# Patient Record
Sex: Male | Born: 1935
Health system: Southern US, Community
[De-identification: ages and names within clinical notes are randomized; demographics above are authoritative.]

## PROBLEM LIST (undated history)

## (undated) DIAGNOSIS — J449 Chronic obstructive pulmonary disease, unspecified: Secondary | ICD-10-CM

## (undated) DIAGNOSIS — I10 Essential (primary) hypertension: Secondary | ICD-10-CM

## (undated) DIAGNOSIS — E039 Hypothyroidism, unspecified: Secondary | ICD-10-CM

## (undated) DIAGNOSIS — R001 Bradycardia, unspecified: Secondary | ICD-10-CM

## (undated) DIAGNOSIS — D649 Anemia, unspecified: Secondary | ICD-10-CM

## (undated) HISTORY — PX: CATARACT EXTRACTION: SUR2

## (undated) HISTORY — DX: Chronic obstructive pulmonary disease, unspecified: J44.9

## (undated) HISTORY — PX: OTHER SURGICAL HISTORY: SHX169

## (undated) HISTORY — PX: TOTAL HIP ARTHROPLASTY: SHX124

## (undated) HISTORY — PX: PACEMAKER INSERTION: SHX728

## (undated) HISTORY — DX: Anemia, unspecified: D64.9

## (undated) HISTORY — DX: Hypothyroidism, unspecified: E03.9

## (undated) HISTORY — DX: Essential (primary) hypertension: I10

---

## 2011-02-21 HISTORY — PX: GALLBLADDER SURGERY: SHX652

## 2011-03-18 DIAGNOSIS — N400 Enlarged prostate without lower urinary tract symptoms: Secondary | ICD-10-CM | POA: Insufficient documentation

## 2012-08-12 ENCOUNTER — Encounter: Payer: Self-pay | Admitting: Pulmonary Disease

## 2012-08-12 ENCOUNTER — Ambulatory Visit (INDEPENDENT_AMBULATORY_CARE_PROVIDER_SITE_OTHER): Payer: Medicare Other | Admitting: Pulmonary Disease

## 2012-08-12 VITALS — BP 140/80 | HR 101 | Temp 98.4°F | Ht 66.0 in | Wt 169.8 lb

## 2012-08-12 NOTE — Assessment & Plan Note (Signed)
The patient will require oxygen 24 hours a day.  Orders have been sent to arrange this.

## 2012-08-12 NOTE — Assessment & Plan Note (Signed)
The patient likely has significant airflow obstruction, and I'm wondering if he has reached a critical FEV1 that has resulted in his sudden dyspnea on exertion.  He is on an aggressive bronchodilator regimen, and I would like to continue this.  I will eliminate redundant medication.  He needs to have pulmonary function studies done, and these will be arranged.  It sounds like he is still having asthmatic bronchitis, and I've asked him to finish up his prednisone and an antibiotic.  He is obviously going to need oxygen 24 hours a day, and I also discussed with his granddaughter the possibility of pulmonary rehabilitation.  I suspect his dry hacking cough is related to his ACE inhibitor that was recently discontinued, but also there is a history and CT findings that would suggest GERD.  I think he should be started on a proton pump inhibitor daily.

## 2012-08-12 NOTE — Patient Instructions (Addendum)
Finish up your antibiotic and prednisone as directed. Stay on your symbicort and spiriva, but do not use your neb machine regularly.  This should be saved for "bad days" and for rescue.  Will see if using a spacer with your symbicort helps you get more of the medication in.  Will need to wear your oxygen 24hrs a day from this point forward. Would like to treat you for reflux disease, and start back on nexium one each day EVERYDAY.  I suspect your cough will greatly improve off your prior blood pressure medication.  It will take awhile to get this completely out of your system. Will schedule for breathing tests in 2 weeks, and see you back same day for review.

## 2012-08-12 NOTE — Progress Notes (Signed)
Subjective:    Patient ID: [name] Plant Sr., male    DOB: 06/10/1926, 79 y.o.   MRN: [id number]  HPI The patient is an 79 year old male who comes in today as a self-referral for pulmonary evaluation.  He has a long history of tobacco abuse, but has not smoked since 1973.  Apparently, last summer, he was able to do just about any physical activity that he wished.  He was recently admitted the end of January in [place] with increasing cough and congestion, and was felt to have a COPD exacerbation.  He was treated with antibiotics and prednisone, and discharged home.  His chest x-ray during that time did not show any evidence for pneumonia.  The patient feels he never got back to his usual baseline, although he did clearly feel better.  He recently went to the emergency room 5 days ago with a severe dry hacking cough that sounded cyclical in nature, as well as a cough that would at times produce large quantities of purulent mucus.  He had another chest x-ray that did not show pneumonia, and also a CT scan of the chest that was fairly unremarkable except for emphysematous changes.  It should be noted that he was on an ACE inhibitor at that time, and this has been discontinued.  He was treated with Levaquin and a course of prednisone, and has been on that since last weekend.  He does think that he is improving, but still is bringing up purulent mucus.  The patient states he now will get winded bringing groceries in from the car, and his spells notices worsening dyspnea anytime he has to use his arms.  He is describing coughing paroxysms that are dry.  The patient has some postnasal drip, but admits to having significant reflux symptoms.  His CT chest did show a dilated esophagus with dysmotility and air-fluid levels.  He is currently on oxygen with sleep, but desaturates significantly in the office today with exertion.  He has not had recent pulmonary function studies.  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   Review of Systems  Constitutional: Negative for fever and unexpected weight change.  HENT: Positive for rhinorrhea and sneezing. Negative for ear pain, nosebleeds, congestion, sore throat, trouble swallowing, dental problem, postnasal drip and sinus pressure.   Eyes: Negative for redness and itching.  Respiratory: Positive for cough, shortness of breath and wheezing. Negative for chest tightness.   Cardiovascular: Negative for palpitations and leg swelling.  Gastrointestinal: Negative for nausea and vomiting.  Genitourinary: Negative for dysuria.  Musculoskeletal: Negative for joint swelling.  Skin: Negative for rash.  Neurological: Negative for headaches.  Hematological: Does not bruise/bleed easily.  Psychiatric/Behavioral: Negative for dysphoric mood. The patient is not nervous/anxious.        Objective:   Physical Exam Constitutional:  Well developed, no acute distress  HENT:  Nares patent without discharge  Oropharynx without exudate, palate and uvula are normal  Eyes:  Perrla, eomi, no scleral icterus  Neck:  No JVD, no TMG  Cardiovascular:  Normal rate, regular rhythm, no rubs or gallops.  No murmurs        Intact distal pulses but decreased.  Pulmonary :  decreased breath sounds, no stridor or respiratory distress   Rhonchi noted, but no wheezing.   Abdominal:  Soft, nondistended, bowel sounds present.  No tenderness noted.   Musculoskeletal:  mild lower extremity edema noted.  Lymph Nodes:  No cervical lymphadenopathy noted  Skin:  No cyanosis noted  Neurologic:  Alert, appropriate, moves all 4 extremities without obvious deficit.         Assessment & Plan:

## 2012-08-26 ENCOUNTER — Encounter: Payer: Self-pay | Admitting: Pulmonary Disease

## 2012-08-26 ENCOUNTER — Ambulatory Visit (INDEPENDENT_AMBULATORY_CARE_PROVIDER_SITE_OTHER): Payer: Medicare Other | Admitting: Pulmonary Disease

## 2012-08-26 VITALS — BP 138/72 | HR 86 | Temp 97.4°F | Ht 66.0 in | Wt 163.0 lb

## 2012-08-26 DIAGNOSIS — J9610 Chronic respiratory failure, unspecified whether with hypoxia or hypercapnia: Secondary | ICD-10-CM

## 2012-08-26 LAB — PULMONARY FUNCTION TEST

## 2012-08-26 MED ORDER — ESOMEPRAZOLE MAGNESIUM 40 MG PO CPDR: 40.0000 mg | DELAYED_RELEASE_CAPSULE | Freq: Every day | ORAL | Status: DC

## 2012-08-26 NOTE — Progress Notes (Signed)
  Subjective:    Patient ID: [name] Plant Sr., male    DOB: 06/10/1926, 79 y.o.   MRN: [id number]  HPI The patient comes in today for followup of his known COPD, as well as chronic cough.  At the last visit, he was asked to stay off his ACE inhibitor, and also placed on more consistent proton pump inhibitor use for reflux.  He is seen significant improvement in his cough since being on the medication.  He denies having any swallowing issues, or breakthrough reflux symptoms.  He had PFTs done today which showed no airflow obstruction by FEV1 percent, but did have obstruction by flow-volume loop and a 17% improvement in forced vital capacity with bronchodilator.  He also had very severe air trapping on lung volumes with no restriction.  His diffusion capacity was normal. I have asked the patient to wear her oxygen more consistently with exertional activities, and although he told to take with him today, he is not wearing it actively.   Review of Systems  Constitutional: Negative for fever and unexpected weight change.  HENT: Positive for rhinorrhea and postnasal drip. Negative for ear pain, nosebleeds, congestion, sore throat, sneezing, trouble swallowing, dental problem and sinus pressure.   Eyes: Negative for redness and itching.  Respiratory: Positive for cough. Negative for chest tightness, shortness of breath and wheezing.   Cardiovascular: Negative for palpitations and leg swelling.  Gastrointestinal: Negative for nausea and vomiting.  Genitourinary: Negative for dysuria.  Musculoskeletal: Negative for joint swelling.  Skin: Negative for rash.  Neurological: Negative for headaches.  Hematological: Does not bruise/bleed easily.  Psychiatric/Behavioral: Negative for dysphoric mood. The patient is not nervous/anxious.        Objective:   Physical Exam Wd male in nad Nose without purulence or discharge noted. Neck without TMG/LN Chest with mildly decreased bs, no wheezing Cor with  RRR LE without significant edema, no cyanosis Alert and oriented, moves all 4.        Assessment & Plan:

## 2012-08-26 NOTE — Patient Instructions (Addendum)
Would take 1/2 of a prednisone tab every other day for 5 days, then stop Would recommend that you go to pulmonary rehab at [facility].  Let me know if you are interested. Stay on oxygen at night while sleeping, and use during day when you are away from home or doing exertional activity around the house. Stay on your symbicort and spiriva Would like you to stay on your acid reflux medication, and will change to omeprazole 40mg  one a day (generic for nexium) Please call if your cough becomes more of a problem again. followup with me in 4mos.

## 2012-08-26 NOTE — Assessment & Plan Note (Signed)
The patient has a least moderate airflow obstruction primarily manifested as air trapping.  He is on a very good bronchodilator regimen, and also requires oxygen with sleep and exertional activities.  I think he would greatly benefit from a pulmonary rehabilitation program as well, and he will consider this.  If he continues with significant dyspnea, would consider a cardiac workup for completeness.

## 2012-08-26 NOTE — Progress Notes (Signed)
PFT done today. 

## 2012-08-26 NOTE — Assessment & Plan Note (Signed)
The pt's cough is much improved off his ACE, and with more aggressive treatment of his GERD.  If this does not totally resolve, would consider GI evaluation.

## 2012-09-01 ENCOUNTER — Other Ambulatory Visit: Payer: Self-pay | Admitting: Pulmonary Disease

## 2012-09-01 MED ORDER — ESOMEPRAZOLE MAGNESIUM 40 MG PO CPDR: 40.0000 mg | DELAYED_RELEASE_CAPSULE | Freq: Every day | ORAL | Status: DC

## 2012-09-14 ENCOUNTER — Telehealth: Payer: Self-pay | Admitting: Pulmonary Disease

## 2012-09-14 DIAGNOSIS — J439 Emphysema, unspecified: Secondary | ICD-10-CM

## 2012-09-14 DIAGNOSIS — J438 Other emphysema: Secondary | ICD-10-CM

## 2012-09-14 NOTE — Telephone Encounter (Signed)
Can we change pt's nexium to A generic and advised regarding referral. Thanks [facility]

## 2012-09-14 NOTE — Telephone Encounter (Signed)
Can give him omeprazole at same dose.

## 2012-09-15 ENCOUNTER — Encounter: Payer: Self-pay | Admitting: Pulmonary Disease

## 2012-09-15 MED ORDER — OMEPRAZOLE 40 MG PO CPDR: 40.0000 mg | DELAYED_RELEASE_CAPSULE | Freq: Every day | ORAL | Status: DC

## 2012-09-15 NOTE — Telephone Encounter (Signed)
Per TD send RX to [facility]. I have done so. Please advise regarding Rehab [facility] thanks

## 2012-09-15 NOTE — Telephone Encounter (Signed)
Will advise TD OF THIS FIRST

## 2012-09-15 NOTE — Telephone Encounter (Signed)
Ok to refer to pulmonary rehab.   

## 2012-09-15 NOTE — Telephone Encounter (Signed)
Order placed. Brownie Gockel, CMA  

## 2012-09-26 ENCOUNTER — Encounter: Payer: Self-pay | Admitting: Pulmonary Disease

## 2012-10-03 ENCOUNTER — Encounter: Payer: Self-pay | Admitting: *Deleted

## 2012-10-03 ENCOUNTER — Encounter: Payer: Self-pay | Admitting: Adult Health

## 2012-10-03 ENCOUNTER — Other Ambulatory Visit (INDEPENDENT_AMBULATORY_CARE_PROVIDER_SITE_OTHER): Payer: Medicare Other

## 2012-10-03 ENCOUNTER — Ambulatory Visit (INDEPENDENT_AMBULATORY_CARE_PROVIDER_SITE_OTHER): Payer: Medicare Other | Admitting: Adult Health

## 2012-10-03 ENCOUNTER — Ambulatory Visit (INDEPENDENT_AMBULATORY_CARE_PROVIDER_SITE_OTHER)
Admission: RE | Admit: 2012-10-03 | Discharge: 2012-10-03 | Disposition: A | Discharge status: Home / Self Care | Payer: Medicare Other | Source: Ambulatory Visit | Attending: Adult Health | Admitting: Adult Health

## 2012-10-03 VITALS — BP 146/64 | HR 60 | Temp 99.1°F | Ht 66.0 in | Wt 169.8 lb

## 2012-10-03 DIAGNOSIS — R0602 Shortness of breath: Secondary | ICD-10-CM

## 2012-10-03 DIAGNOSIS — J439 Emphysema, unspecified: Secondary | ICD-10-CM

## 2012-10-03 DIAGNOSIS — J438 Other emphysema: Secondary | ICD-10-CM

## 2012-10-03 DIAGNOSIS — J449 Chronic obstructive pulmonary disease, unspecified: Secondary | ICD-10-CM

## 2012-10-03 DIAGNOSIS — R600 Localized edema: Secondary | ICD-10-CM

## 2012-10-03 DIAGNOSIS — R609 Edema, unspecified: Secondary | ICD-10-CM

## 2012-10-03 LAB — BRAIN NATRIURETIC PEPTIDE: Pro B Natriuretic peptide (BNP): 17 pg/mL (ref 0.0–100.0)

## 2012-10-03 LAB — CBC WITH DIFFERENTIAL/PLATELET
Basophils Relative: 0.4 % (ref 0.0–3.0)
Eosinophils Absolute: 0.1 10*3/uL (ref 0.0–0.7)
Lymphocytes Relative: 20.3 % (ref 12.0–46.0)
MCHC: 33.3 g/dL (ref 30.0–36.0)
Monocytes Relative: 8.3 % (ref 3.0–12.0)
Neutrophils Relative %: 69.4 % (ref 43.0–77.0)
RBC: 4.09 Mil/uL — ABNORMAL LOW (ref 4.22–5.81)
WBC: 7.8 10*3/uL (ref 4.5–10.5)

## 2012-10-03 LAB — BASIC METABOLIC PANEL
BUN: 19 mg/dL (ref 6–23)
CO2: 24 mEq/L (ref 19–32)
Chloride: 101 mEq/L (ref 96–112)
Potassium: 5.3 mEq/L — ABNORMAL HIGH (ref 3.5–5.1)

## 2012-10-03 MED ORDER — FUROSEMIDE 20 MG PO TABS: ORAL_TABLET | ORAL | Status: DC

## 2012-10-03 NOTE — Patient Instructions (Addendum)
Increase Lasix 20mg  3 tabs daily for 3 days  Low salt diet  Continue on Oxygen 2 l/m continuously.  We are setting you up for a 2 D echo .  I will call with labs and xray results.  Follow up in office in 3 days and As needed   Please contact office for sooner follow up if symptoms do not improve or worsen or seek emergency care

## 2012-10-04 NOTE — Assessment & Plan Note (Addendum)
Worsening Dyspnea ? Etiology  Possible underlying decompensated cor pulmonale  Will check labs with bnp . Set up for echo  Diuresis as tolerated. Have him return for short term follow up with bmet  Case discussed with Dr. Shelle Iron , w/ pt examined by physician as well.  Will attempt OP workup w/ close follow up , pt /family aware if condition worsens to contact us immediately or go to ER   Plan  Increase Lasix 20mg  3 tabs daily for 3 days  Low salt diet  Continue on Oxygen 2 l/m continuously.  We are setting you up for a 2 D echo .  I will call with labs and xray results.  Follow up in office in 3 days and As needed   Please contact office for sooner follow up if symptoms do not improve or worsen or seek emergency care

## 2012-10-04 NOTE — Progress Notes (Signed)
  Subjective:    Patient ID: [name] Plant Sr., male    DOB: [birth year], 79 y.o.   MRN: [id number]  HPI 79 yo male with known hx of COPD, as well as chronic cough. Seen for initial pulmonary consult 08/12/12  ACE stopped due to cough ~08/12/12  PFTs 08/26/12   which showed no airflow obstruction by FEV1 percent, but did have obstruction by flow-volume loop and a 17% improvement in forced vital capacity with bronchodilator.  CT scan of the chest~ 08/2012 that was fairly unremarkable except for emphysematous changes.  10/03/12 Acute OV  Complains of leg swelling, feels like he is retaining lots of fluid. Seen in ER yesterday, started on Lasix 20mg  . Does not feel it is helping.   Pt reports SOB and feels very weak. Wears out easily with walking.  Cough is not much better, Still has dry barking cough.  No previous cardiac workup.  Has orthopnea. Had to sleep in recliner last night.  No calf pain , chest pain or hemoptysis.  Wearing his O2 24/7 now.  No fever or discolored mucus.  Spiriva was stopped by urology due to urinary retention (Hx of BPH) .  Remains on symbicort Twice daily       Review of Systems Constitutional:   No  weight loss, night sweats,  Fevers, chills,  +fatigue, or  lassitude.  HEENT:   No headaches,  Difficulty swallowing,  Tooth/dental problems, or  Sore throat,                No sneezing, itching, ear ache,  +nasal congestion, post nasal drip,   CV:  No chest pain,    dizziness, palpitations, syncope.   GI  No heartburn, indigestion, abdominal pain, nausea, vomiting, diarrhea, change in bowel habits, loss of appetite, bloody stools.   Resp:     No coughing up of blood.  No change in color of mucus.    No chest wall deformity  Skin: no rash or lesions.  GU: no dysuria, change in color of urine, no urgency or frequency.  No flank pain, no hematuria   MS:  No joint pain or swelling.  No decreased range of motion.  No back pain.  Psych:  No change in mood or  affect. No depression or anxiety.  No memory loss.          Objective:   Physical Exam GEN: A/Ox3; pleasant , NAD, elderly   HEENT:  [place]/AT,  EACs-clear, TMs-wnl, NOSE-clear, THROAT-clear, no lesions, no postnasal drip or exudate noted.   NECK:  Supple w/ fair ROM; no JVD; normal carotid impulses w/o bruits; no thyromegaly or nodules palpated; no lymphadenopathy.  RESP  Diminished BS in bases  w/o, wheezes/ rales/ or rhonchi.no accessory muscle use, no dullness to percussion  CARD:  RRR, no m/r/g  , 1-2+peripheral edema, pulses intact, no cyanosis or clubbing.  GI:   Soft & nt; nml bowel sounds; no organomegaly or masses detected.  Musco: Warm bil, no deformities or joint swelling noted.   Neuro: alert, no focal deficits noted.    Skin: Warm, no lesions or rashes         Assessment & Plan:

## 2012-10-05 NOTE — Progress Notes (Signed)
Quick Note:  Spoke with patient's daughter, she is aware of pt's cxr results / recs as stated by TP. Tammy verbalized her understanding and denied any questions. ______

## 2012-10-05 NOTE — Progress Notes (Signed)
Quick Note:  Spoke with patient's daughter, she is aware of pt's cxr results / recs as stated by TP. Tammy verbalized her understanding and denied any questions. ______ 

## 2012-10-06 ENCOUNTER — Encounter: Payer: Self-pay | Admitting: Adult Health

## 2012-10-06 ENCOUNTER — Inpatient Hospital Stay (HOSPITAL_COMMUNITY): Payer: Medicare Other

## 2012-10-06 ENCOUNTER — Inpatient Hospital Stay (HOSPITAL_COMMUNITY)
Admission: AD | Admit: 2012-10-06 | Discharge: 2012-10-10 | DRG: 189 | Disposition: A | Discharge status: Home Health | Payer: Medicare Other | Source: Ambulatory Visit | Attending: Pulmonary Disease | Admitting: Pulmonary Disease

## 2012-10-06 ENCOUNTER — Ambulatory Visit (INDEPENDENT_AMBULATORY_CARE_PROVIDER_SITE_OTHER): Payer: Medicare Other | Admitting: Adult Health

## 2012-10-06 ENCOUNTER — Encounter (HOSPITAL_COMMUNITY): Payer: Self-pay

## 2012-10-06 ENCOUNTER — Ambulatory Visit (HOSPITAL_BASED_OUTPATIENT_CLINIC_OR_DEPARTMENT_OTHER): Payer: Medicare Other

## 2012-10-06 VITALS — BP 162/68 | HR 83 | Temp 97.1°F | Ht 66.0 in | Wt 167.0 lb

## 2012-10-06 DIAGNOSIS — N400 Enlarged prostate without lower urinary tract symptoms: Secondary | ICD-10-CM | POA: Diagnosis present

## 2012-10-06 DIAGNOSIS — J9620 Acute and chronic respiratory failure, unspecified whether with hypoxia or hypercapnia: Principal | ICD-10-CM | POA: Diagnosis present

## 2012-10-06 DIAGNOSIS — K219 Gastro-esophageal reflux disease without esophagitis: Secondary | ICD-10-CM | POA: Diagnosis present

## 2012-10-06 DIAGNOSIS — R0602 Shortness of breath: Secondary | ICD-10-CM

## 2012-10-06 DIAGNOSIS — J45901 Unspecified asthma with (acute) exacerbation: Secondary | ICD-10-CM

## 2012-10-06 DIAGNOSIS — J9600 Acute respiratory failure, unspecified whether with hypoxia or hypercapnia: Secondary | ICD-10-CM

## 2012-10-06 DIAGNOSIS — R05 Cough: Secondary | ICD-10-CM

## 2012-10-06 DIAGNOSIS — R059 Cough, unspecified: Secondary | ICD-10-CM | POA: Diagnosis present

## 2012-10-06 DIAGNOSIS — R0600 Dyspnea, unspecified: Secondary | ICD-10-CM

## 2012-10-06 DIAGNOSIS — R911 Solitary pulmonary nodule: Secondary | ICD-10-CM | POA: Diagnosis present

## 2012-10-06 DIAGNOSIS — J019 Acute sinusitis, unspecified: Secondary | ICD-10-CM | POA: Diagnosis present

## 2012-10-06 DIAGNOSIS — E039 Hypothyroidism, unspecified: Secondary | ICD-10-CM | POA: Diagnosis present

## 2012-10-06 DIAGNOSIS — J439 Emphysema, unspecified: Secondary | ICD-10-CM

## 2012-10-06 DIAGNOSIS — J449 Chronic obstructive pulmonary disease, unspecified: Secondary | ICD-10-CM

## 2012-10-06 DIAGNOSIS — J9610 Chronic respiratory failure, unspecified whether with hypoxia or hypercapnia: Secondary | ICD-10-CM

## 2012-10-06 DIAGNOSIS — R0609 Other forms of dyspnea: Secondary | ICD-10-CM

## 2012-10-06 DIAGNOSIS — R0902 Hypoxemia: Secondary | ICD-10-CM | POA: Diagnosis present

## 2012-10-06 DIAGNOSIS — J441 Chronic obstructive pulmonary disease with (acute) exacerbation: Secondary | ICD-10-CM | POA: Diagnosis present

## 2012-10-06 DIAGNOSIS — I10 Essential (primary) hypertension: Secondary | ICD-10-CM | POA: Diagnosis present

## 2012-10-06 DIAGNOSIS — Z87891 Personal history of nicotine dependence: Secondary | ICD-10-CM

## 2012-10-06 DIAGNOSIS — R600 Localized edema: Secondary | ICD-10-CM

## 2012-10-06 LAB — COMPREHENSIVE METABOLIC PANEL
AST: 29 U/L (ref 0–37)
CO2: 29 mEq/L (ref 19–32)
Calcium: 9 mg/dL (ref 8.4–10.5)
Creatinine, Ser: 0.9 mg/dL (ref 0.50–1.35)
GFR calc Af Amer: 87 mL/min — ABNORMAL LOW (ref >90)
GFR calc non Af Amer: 75 mL/min — ABNORMAL LOW (ref >90)
Total Protein: 6.5 g/dL (ref 6.0–8.3)

## 2012-10-06 LAB — CBC WITH DIFFERENTIAL/PLATELET
Lymphocytes Relative: 19 % (ref 12–46)
Lymphs Abs: 1.5 10*3/uL (ref 0.7–4.0)
MCV: 87.8 fL (ref 78.0–100.0)
Neutrophils Relative %: 69 % (ref 43–77)
Platelets: 173 10*3/uL (ref 150–400)
RBC: 3.84 MIL/uL — ABNORMAL LOW (ref 4.22–5.81)
WBC: 7.8 10*3/uL (ref 4.0–10.5)

## 2012-10-06 LAB — URINALYSIS, ROUTINE W REFLEX MICROSCOPIC
Leukocytes, UA: NEGATIVE
Nitrite: NEGATIVE
Specific Gravity, Urine: 1.011 (ref 1.005–1.030)
pH: 6 (ref 5.0–8.0)

## 2012-10-06 LAB — PRO B NATRIURETIC PEPTIDE: Pro B Natriuretic peptide (BNP): 96.4 pg/mL (ref 0–450)

## 2012-10-06 MED ORDER — ARFORMOTEROL TARTRATE 15 MCG/2ML IN NEBU
15.0000 ug | INHALATION_SOLUTION | Freq: Two times a day (BID) | RESPIRATORY_TRACT | Status: DC
  Administered 2012-10-06 – 2012-10-10 (×8): 15 ug via RESPIRATORY_TRACT
  Filled 2012-10-06 (×12): qty 2

## 2012-10-06 MED ORDER — PANTOPRAZOLE SODIUM 40 MG PO TBEC
40.0000 mg | DELAYED_RELEASE_TABLET | Freq: Two times a day (BID) | ORAL | Status: DC
  Administered 2012-10-06 – 2012-10-10 (×8): 40 mg via ORAL
  Filled 2012-10-06 (×10): qty 1

## 2012-10-06 MED ORDER — DEXTROSE 5 % IV SOLN
500.0000 mg | INTRAVENOUS | Status: DC
  Administered 2012-10-06: 500 mg via INTRAVENOUS
  Filled 2012-10-06: qty 500

## 2012-10-06 MED ORDER — SENNOSIDES-DOCUSATE SODIUM 8.6-50 MG PO TABS
1.0000 | ORAL_TABLET | Freq: Every evening | ORAL | Status: DC | PRN
  Filled 2012-10-06: qty 1

## 2012-10-06 MED ORDER — ONDANSETRON HCL 4 MG PO TABS: 4.0000 mg | ORAL_TABLET | Freq: Four times a day (QID) | ORAL | Status: DC | PRN

## 2012-10-06 MED ORDER — LEVOTHYROXINE SODIUM 75 MCG PO TABS
75.0000 ug | ORAL_TABLET | Freq: Every day | ORAL | Status: DC
  Administered 2012-10-06 – 2012-10-10 (×5): 75 ug via ORAL
  Filled 2012-10-06 (×7): qty 1

## 2012-10-06 MED ORDER — POTASSIUM CHLORIDE CRYS ER 20 MEQ PO TBCR
40.0000 meq | EXTENDED_RELEASE_TABLET | Freq: Once | ORAL | Status: AC
  Administered 2012-10-06: 40 meq via ORAL
  Filled 2012-10-06: qty 2

## 2012-10-06 MED ORDER — BUDESONIDE 0.25 MG/2ML IN SUSP
0.2500 mg | Freq: Two times a day (BID) | RESPIRATORY_TRACT | Status: DC
  Administered 2012-10-06 – 2012-10-10 (×8): 0.25 mg via RESPIRATORY_TRACT
  Filled 2012-10-06 (×12): qty 2

## 2012-10-06 MED ORDER — HEPARIN SODIUM (PORCINE) 5000 UNIT/ML IJ SOLN
5000.0000 [IU] | Freq: Three times a day (TID) | INTRAMUSCULAR | Status: DC
  Administered 2012-10-06 – 2012-10-10 (×11): 5000 [IU] via SUBCUTANEOUS
  Filled 2012-10-06 (×14): qty 1

## 2012-10-06 MED ORDER — HYDROCODONE-HOMATROPINE 5-1.5 MG/5ML PO SYRP: 5.0000 mL | ORAL_SOLUTION | ORAL | Status: DC | PRN

## 2012-10-06 MED ORDER — ACETAMINOPHEN 650 MG RE SUPP: 650.0000 mg | Freq: Four times a day (QID) | RECTAL | Status: DC | PRN

## 2012-10-06 MED ORDER — SODIUM CHLORIDE 0.9 % IV SOLN: 250.0000 mL | INTRAVENOUS | Status: DC | PRN

## 2012-10-06 MED ORDER — SODIUM CHLORIDE 0.9 % IJ SOLN
3.0000 mL | Freq: Two times a day (BID) | INTRAMUSCULAR | Status: DC
  Administered 2012-10-06 – 2012-10-09 (×5): 3 mL via INTRAVENOUS

## 2012-10-06 MED ORDER — TAMSULOSIN HCL 0.4 MG PO CAPS
0.4000 mg | ORAL_CAPSULE | Freq: Every day | ORAL | Status: DC
  Administered 2012-10-06 – 2012-10-09 (×4): 0.4 mg via ORAL
  Filled 2012-10-06 (×5): qty 1

## 2012-10-06 MED ORDER — FINASTERIDE 5 MG PO TABS
5.0000 mg | ORAL_TABLET | Freq: Every day | ORAL | Status: DC
  Administered 2012-10-06 – 2012-10-10 (×5): 5 mg via ORAL
  Filled 2012-10-06 (×5): qty 1

## 2012-10-06 MED ORDER — SODIUM CHLORIDE 0.9 % IJ SOLN
3.0000 mL | Freq: Two times a day (BID) | INTRAMUSCULAR | Status: DC
  Administered 2012-10-07 – 2012-10-10 (×4): 3 mL via INTRAVENOUS

## 2012-10-06 MED ORDER — ONDANSETRON HCL 4 MG/2ML IJ SOLN: 4.0000 mg | Freq: Four times a day (QID) | INTRAMUSCULAR | Status: DC | PRN

## 2012-10-06 MED ORDER — ALBUTEROL SULFATE (5 MG/ML) 0.5% IN NEBU: 2.5000 mg | INHALATION_SOLUTION | RESPIRATORY_TRACT | Status: DC | PRN

## 2012-10-06 MED ORDER — FUROSEMIDE 20 MG PO TABS
20.0000 mg | ORAL_TABLET | Freq: Every day | ORAL | Status: DC
  Filled 2012-10-06: qty 1

## 2012-10-06 MED ORDER — SODIUM CHLORIDE 0.9 % IJ SOLN: 3.0000 mL | INTRAMUSCULAR | Status: DC | PRN

## 2012-10-06 MED ORDER — DILTIAZEM HCL ER BEADS 120 MG PO CP24
120.0000 mg | ORAL_CAPSULE | Freq: Every day | ORAL | Status: DC
  Administered 2012-10-06 – 2012-10-10 (×5): 120 mg via ORAL
  Filled 2012-10-06 (×5): qty 1

## 2012-10-06 MED ORDER — METHYLPREDNISOLONE SODIUM SUCC 125 MG IJ SOLR
80.0000 mg | Freq: Three times a day (TID) | INTRAMUSCULAR | Status: DC
  Administered 2012-10-06 – 2012-10-07 (×3): 80 mg via INTRAVENOUS
  Filled 2012-10-06 (×6): qty 1.28

## 2012-10-06 MED ORDER — ACETAMINOPHEN 325 MG PO TABS: 650.0000 mg | ORAL_TABLET | Freq: Four times a day (QID) | ORAL | Status: DC | PRN

## 2012-10-06 NOTE — Assessment & Plan Note (Signed)
Refractory asthmatic flare w/ associated Hypoxia .  Case discussed with Dr. Sherene Sires   Pt will be admitted to [facility] for further tx

## 2012-10-06 NOTE — H&P (Signed)
PULMONARY  / CRITICAL CARE MEDICINE  Name: [name] Bathgate Sr. MRN: [id number] DOB: 08/19/1924    ADMISSION DATE:  10/06/2012   PRIMARY SERVICE:  Pulmonary   CHIEF COMPLAINT:  Bad cough/ Can/t breathe   BRIEF PATIENT DESCRIPTION:  79 yo former smoker (quit 1973) admitted 4/24 with refractory asthma flare refractory  to OP therapy.   SIGNIFICANT EVENTS / STUDIES:  2 D echo 4/24>nml EF , grade 1 diastolic dysfxn, Mild LA dilation, nml RV , PAP 33   LINES / TUBES:   CULTURES: 4/24 Sputum >  ANTIBIOTICS: 4/24 Zithromax >>  HISTORY OF PRESENT ILLNESS:  79 yo male former smoker, quit 1973 dx with dx aeCOPD during [place] admit 06/2012   CT chest showed emphysema . He was started on o2. Seen for initial pulmonary consult 08/12/12 in office by Dr. Shelle Iron  ACE stopped due to cough ~08/12/12  PFTs 08/26/12 which showed no airflow obstruction by FEV1 percent, but did have obstruction by flow-volume loop and a 17% improvement in forced vital capacity with bronchodilator after max rx including prednisone and improved to point where was not wearing 02  He returned to office on 10/03/12 for an acute work in visit due to complains of leg swelling, feels like he is retaining lots of fluid. He went to ER 4/20 , started on Lasix 20mg  . Does not feel it is helping. Complained of orthopnea. Lasix was increased to 60mg  x 3 days and set up for Echo.  BNP was nml. He desats on RA requiring O2 continuously.  Previously started on Spiriva but was stopped by urology due to urinary retention (Hx of BPH) .  Remains on symbicort Twice daily with out much help.   Returned back to office today with no improvement and worsening of cough, wheezing and dyspnea.  CXR on 4/21  no acute PNA, COPD changes. No CM noted or effusions.  He has lost 2 lbs w/ no change in leg swelling. No change in DOE/dyspnea. He denies calf pain p.  No obvious daytime variabilty or assoc  cp or chest tightness, subjective wheeze overt sinus  or hb symptoms. No unusual exp hx or h/o childhood pna/ asthma or premature birth to his knowledge.   Will be admitted for further evaluation and tx.    PAST MEDICAL HISTORY :  Past Medical History  Diagnosis Date  . HTN (hypertension)   . Seasonal allergies    Past Surgical History  Procedure Laterality Date  . Gallbladder surgery  02/21/2011  . Cataract extraction    . Other surgical history      finger right pointer--laceration repair   Prior to Admission medications   Medication Sig Start Date End Date Taking? Authorizing Provider  budesonide-formoterol (SYMBICORT) 160-4.5 MCG/ACT inhaler Inhale 1 puff into the lungs 2 (two) times daily.    Historical Provider, MD  diltiazem (TIAZAC) 120 MG 24 hr capsule Take 120 mg by mouth daily.    Historical Provider, MD  esomeprazole (NEXIUM) 40 MG capsule Take 40 mg by mouth daily before breakfast.    Historical Provider, MD  finasteride (PROSCAR) 5 MG tablet Take 5 mg by mouth daily.    Historical Provider, MD  furosemide (LASIX) 20 MG tablet 1-3 tabs daily as needed. 10/03/12   Tammy S Parrett, NP  ipratropium-albuterol (DUONEB) 0.5-2.5 (3) MG/3ML SOLN Take 3 mLs by nebulization 3 (three) times daily as needed.     Historical Provider, MD  levothyroxine (SYNTHROID, LEVOTHROID) 75 MCG tablet Take 75 mcg  by mouth daily.    Historical Provider, MD  omeprazole (PRILOSEC) 40 MG capsule Take 1 capsule (40 mg total) by mouth daily. Before breakfast 09/15/12   Barbaraann Share, MD  OXYGEN-HELIUM IN Inhale 2 L/min into the lungs at bedtime.    Historical Provider, MD  tamsulosin (FLOMAX) 0.4 MG CAPS Take 0.4 mg by mouth daily after supper.    Historical Provider, MD  vitamin B-12 (CYANOCOBALAMIN) 1000 MCG tablet Take 1,000 mcg by mouth daily.    Historical Provider, MD   Allergies  Allergen Reactions  . Codeine     FAMILY HISTORY:  Family History  Problem Relation Age of Onset  . Other Father     enlarged heart  . Lung disease Mother   . COPD  Sister   . Heart disease Sister    SOCIAL HISTORY:  reports that he quit smoking about 41 years ago. His smoking use included Cigarettes. He has a 37.5 pack-year smoking history. He has never used smokeless tobacco. He reports that he does not drink alcohol or use illicit drugs.  REVIEW OF SYSTEMS:       Constitutional: No weight loss, night sweats, Fevers, chills,  +fatigue, or lassitude.  HEENT: No headaches, Difficulty swallowing, Tooth/dental problems, or Sore throat,  No sneezing, itching, ear ache,  +nasal congestion, post nasal drip,  CV: No chest pain, dizziness, palpitations, syncope.  GI No heartburn, indigestion, abdominal pain, nausea, vomiting, diarrhea, change in bowel habits, loss of appetite, bloody stools.  Resp: No coughing up of blood. No chest wall deformity  Skin: no rash or lesions.  GU: no dysuria, change in color of urine, no urgency or frequency. No flank pain, no hematuria  MS: No joint pain or swelling. No decreased range of motion. No back pain.  Psych: No change in mood or affect. No depression or anxiety. No memory loss.    SUBJECTIVE:  "Breathing is worse" Can't stop coughing  VITAL SIGNS: Temp:  [97.1 F (36.2 C)] 97.1 F (36.2 C) (04/24 1520) Pulse Rate:  [83] 83 (04/24 1520) BP: (162)/(68) 162/68 mmHg (04/24 1520) SpO2:  [92 %] 92 % (04/24 1520) Weight:  [75.751 kg (167 lb)] 75.751 kg (167 lb) (04/24 1520) 02 3lpm  rx 3lpm  PHYSICAL EXAMINATION: GEN: A/Ox3; pleasant , NAD, elderly  HEENT: [place]/AT, EACs-clear, TMs-wnl, NOSE-clear, THROAT-clear, no lesions, no postnasal drip or exudate noted.  NECK: Supple w/ fair ROM; no JVD; normal carotid impulses w/o bruits; no thyromegaly or nodules palpated; no lymphadenopathy.  RESP Coarse rhonchi w/ exp wheezes , no accessory muscle use, no dullness to percussion  CARD: RRR, no m/r/g , 1-2+peripheral edema, pulses intact, no cyanosis or clubbing.  GI: Soft & nt; nml bowel sounds; no organomegaly or  masses detected.  Musco: Warm bil, no deformities or joint swelling noted.  Neuro: alert, no focal deficits noted.  Skin: Warm, no lesions or rashes   Recent Labs Lab 10/03/12 1651  NA 135  K 5.3*  CL 101  CO2 24  BUN 19  CREATININE 1.5  GLUCOSE 116*    Recent Labs Lab 10/03/12 1651  HGB 12.3*  HCT 37.0*  WBC 7.8  PLT 178.0   No results found.  ASSESSMENT / PLAN:  1. Refractory Asthma  Exacerbation  -no airflow obstruction on PFT 08/2012 , +reversibility on mid flows, DLCO nml.   Plan  Check cxr  Begin Pulmicort and Budesonide Neb Begin Solumedrol 80 mg q8  And max gerd rx Albuterol As needed  Check CT sinus  Begin zithromax IV   2. Cyclical cough w/ recent ACE Inhibitor exposure  Plan :  -check CT sinus -Remain off ACE  -GERD control with PPI   3. Hypoxia ? Secondary to Asthma flare  -BNP nml ,  2 D echo 4/24>mild LVH , nml EF, gr 1 diastolic dysfxn . No RV dysfxn , PAP ~33 -(doubt PE , although if continues to decompensate consider VQ scan as recent s.cr will not allow for CT angio)   Plan  Titrate O2 for sat >90%  Check bmet  Continue Lasix 20mg  daily   4.?  Hx of PNA - ? [facility] . CXR on 10/03/12 showed clearance .   Plan  Repeat CXR today to r/o re-occurrence.    HTN Plan  Cont home meds.    BPH  Plan  Cont home meds.    Hypothyroid  Plan  Check TSH and cont home meds   Sandrea Hughs MD  Pulmonary and Critical Care Medicine [facility] Pager: [phone]  10/06/2012, 3:53 PM

## 2012-10-06 NOTE — Progress Notes (Signed)
Echocardiogram performed.  

## 2012-10-06 NOTE — Progress Notes (Signed)
Subjective:    Patient ID: [name] Plant Sr., male    DOB: 11-29-1924, 79 y.o.   MRN: [id number]  HPI 79 yo male with known hx of COPD, as well as chronic cough. Seen for initial pulmonary consult 08/12/12  ACE stopped due to cough ~08/12/12  PFTs 08/26/12   which showed no airflow obstruction by FEV1 percent, but did have obstruction by flow-volume loop and a 17% improvement in forced vital capacity with bronchodilator.  CT scan of the chest~ 08/2012 that was fairly unremarkable except for emphysematous changes.  10/03/12 Acute OV  Complains of leg swelling, feels like he is retaining lots of fluid. Seen in ER yesterday, started on Lasix 20mg  . Does not feel it is helping.   Pt reports SOB and feels very weak. Wears out easily with walking.  Cough is not much better, Still has dry barking cough.  No previous cardiac workup.  Has orthopnea. Had to sleep in recliner last night.  No calf pain , chest pain or hemoptysis.  Wearing his O2 24/7 now.  No fever or discolored mucus.  Spiriva was stopped by urology due to urinary retention (Hx of BPH) .  Remains on symbicort Twice daily   >Lasix 60mg  rx , Echo   10/06/2012 Acute OV  Patient returns for follow up for DOE  He does not feel any better . Cough has worsened now with thick yellow mucus. Wheezing is getting worse. More DOE.  Pt seen 3 days ago for progressively worsening DOE and hypoxia  requiring continuous flow O2. Marland Kitchen He had orthopnea and increased leg swelling.  Labs showed nml BNP. Echo is pending today.  CXR showed no acute PNA, COPD changes. No CM noted or effusions.  He has lost 2 lbs w/ no change in leg swelling. No change in DOE/dyspnea.  He denies calf pain.  >admit      Review of Systems  Constitutional:   No  weight loss, night sweats,  Fevers, chills,  +fatigue, or  lassitude.  HEENT:   No headaches,  Difficulty swallowing,  Tooth/dental problems, or  Sore throat,                No sneezing, itching, ear ache,   +nasal congestion, post nasal drip,   CV:  No chest pain,    dizziness, palpitations, syncope.   GI  No heartburn, indigestion, abdominal pain, nausea, vomiting, diarrhea, change in bowel habits, loss of appetite, bloody stools.   Resp:     No coughing up of blood.      No chest wall deformity  Skin: no rash or lesions.  GU: no dysuria, change in color of urine, no urgency or frequency.  No flank pain, no hematuria   MS:  No joint pain or swelling.  No decreased range of motion.  No back pain.  Psych:  No change in mood or affect. No depression or anxiety.  No memory loss.          Objective:   Physical Exam  GEN: A/Ox3; pleasant , NAD, elderly   HEENT:  [place]/AT,  EACs-clear, TMs-wnl, NOSE-clear, THROAT-clear, no lesions, no postnasal drip or exudate noted.   NECK:  Supple w/ fair ROM; no JVD; normal carotid impulses w/o bruits; no thyromegaly or nodules palpated; no lymphadenopathy.  RESP  Coarse rhonchi w/ exp wheezes , no accessory muscle use, no dullness to percussion  CARD:  RRR, no m/r/g  , 1-2+peripheral edema, pulses intact, no cyanosis or clubbing.  GI:  Soft & nt; nml bowel sounds; no organomegaly or masses detected.  Musco: Warm bil, no deformities or joint swelling noted.   Neuro: alert, no focal deficits noted.    Skin: Warm, no lesions or rashes         Assessment & Plan:

## 2012-10-07 ENCOUNTER — Inpatient Hospital Stay (HOSPITAL_COMMUNITY): Payer: Medicare Other

## 2012-10-07 ENCOUNTER — Ambulatory Visit: Payer: Medicare Other | Admitting: Adult Health

## 2012-10-07 DIAGNOSIS — J9600 Acute respiratory failure, unspecified whether with hypoxia or hypercapnia: Secondary | ICD-10-CM

## 2012-10-07 DIAGNOSIS — J438 Other emphysema: Secondary | ICD-10-CM

## 2012-10-07 DIAGNOSIS — R0609 Other forms of dyspnea: Secondary | ICD-10-CM

## 2012-10-07 LAB — CBC
HCT: 34.4 % — ABNORMAL LOW (ref 39.0–52.0)
MCHC: 33.7 g/dL (ref 30.0–36.0)
MCV: 88 fL (ref 78.0–100.0)
RDW: 13.3 % (ref 11.5–15.5)

## 2012-10-07 LAB — BASIC METABOLIC PANEL
BUN: 19 mg/dL (ref 6–23)
Chloride: 98 mEq/L (ref 96–112)
Creatinine, Ser: 0.9 mg/dL (ref 0.50–1.35)
GFR calc Af Amer: 87 mL/min — ABNORMAL LOW (ref >90)

## 2012-10-07 LAB — EXPECTORATED SPUTUM ASSESSMENT W GRAM STAIN, RFLX TO RESP C

## 2012-10-07 MED ORDER — METHYLPREDNISOLONE SODIUM SUCC 40 MG IJ SOLR
40.0000 mg | Freq: Two times a day (BID) | INTRAMUSCULAR | Status: DC
  Administered 2012-10-07 – 2012-10-09 (×4): 40 mg via INTRAVENOUS
  Filled 2012-10-07 (×6): qty 1

## 2012-10-07 MED ORDER — FUROSEMIDE 10 MG/ML IJ SOLN
40.0000 mg | Freq: Two times a day (BID) | INTRAMUSCULAR | Status: DC
  Administered 2012-10-07 – 2012-10-08 (×3): 40 mg via INTRAVENOUS
  Filled 2012-10-07 (×5): qty 4

## 2012-10-07 MED ORDER — ALBUTEROL SULFATE (5 MG/ML) 0.5% IN NEBU: 2.5000 mg | INHALATION_SOLUTION | RESPIRATORY_TRACT | Status: DC | PRN

## 2012-10-07 MED ORDER — PIPERACILLIN-TAZOBACTAM 3.375 G IVPB
3.3750 g | Freq: Three times a day (TID) | INTRAVENOUS | Status: DC
  Administered 2012-10-07 – 2012-10-10 (×10): 3.375 g via INTRAVENOUS
  Filled 2012-10-07 (×11): qty 50

## 2012-10-07 MED ORDER — IOHEXOL 350 MG/ML SOLN
100.0000 mL | Freq: Once | INTRAVENOUS | Status: AC | PRN
  Administered 2012-10-07: 100 mL via INTRAVENOUS

## 2012-10-07 NOTE — Progress Notes (Signed)
Subjective: Feels a little better today.  No increased wob  Objective: Vital signs in last 24 hours: Blood pressure 134/69, pulse 92, temperature 98.5 F (36.9 C), temperature source Oral, resp. rate 18, height 5\' 6"  (1.676 m), weight 74.753 kg (164 lb 12.8 oz), SpO2 91.00%.  Intake/Output from previous day: 04/24 0701 - 04/25 0700 In: 123 [P.O.:120; I.V.:3] Out: 100 [Urine:100]   Physical Exam:   wd male in nad Nose without purulence or discharge noted. Op clear Neck without LN or TMG Chest with mild decrease in bs, totally clear o/w.  Mild ua  pseudowheezing  Cor with rrr Abd benign LE without edema, no cyanosis Alert and oriented, moves all 4.    Lab Results:  Recent Labs  10/06/12 1705 10/07/12 0536  WBC 7.8 4.9  HGB 11.6* 11.6*  HCT 33.7* 34.4*  PLT 173 173   BMET  Recent Labs  10/06/12 1705 10/07/12 0536  NA 134* 135  K 3.1* 4.0  CL 95* 98  CO2 29 30  GLUCOSE 113* 158*  BUN 17 19  CREATININE 0.90 0.90  CALCIUM 9.0 9.1    Studies/Results: X-ray Chest Pa And Lateral   10/07/2012  *RADIOLOGY REPORT*  Clinical Data: Cough, wheezing  CHEST - 2 VIEW  Comparison: Prior chest x-ray 10/03/2012  Findings: A 12 mm left lower lobe nodular opacity seen in both the frontal and lateral view with a suggestion of peripheral spiculation concerning for pulmonary nodule. Of note, even in retrospect this nodular opacity is not well seen on the recent prior x-ray.  Additionally, there is mild bibasilar atelectasis. The lungs are otherwise hyperexpanded but clear.  There is central airway thickening/peribronchial cuffing.  No pulmonary edema, focal airspace consolidation, pneumothorax or large effusion.  Cardiac and mediastinal contours are unchanged.  Aortic atherosclerosis is noted.  No acute osseous abnormality.  IMPRESSION:  1. Probable 12 mm left lower lobe pulmonary nodule.  Recommend further evaluation with CT scan of the chest. 2.  Mild bibasilar atelectasis 3.  COPD 4.   Aortic atherosclerosis  These results will be called to the ordering clinician or representative by the Radiologist Assistant, and communication documented in the PACS Dashboard.   Original Report Authenticated By: Malachy Moan, M.D.    Ct Maxillofacial Ltd Wo Cm  10/07/2012  *RADIOLOGY REPORT*  Clinical Data:  79 year old male with cough and wheezing.  CT PARANASAL SINUS LIMITED WITHOUT CONTRAST  Technique:  Multidetector CT images of the paranasal sinuses were obtained in a single plane without contrast.  Comparison:   None.  Findings:  Negative for age visualized noncontrast brain parenchyma.  Postoperative changes to the globes.  Negative visualized deep soft tissue spaces of the face.  The visible mastoids and tympanic cavities are clear.  The sphenoid sinuses are clear. The frontal sinuses are clear. There is minimal to mild bilateral ethmoid air cell mucosal thickening. The right maxillary sinuses clear. The left maxillary sinus is opacified and also somewhat hypoplastic.  Leftward nasal septal deviation and spurring. No acute osseous abnormality identified.  IMPRESSION: 1.  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 2.  Other paranasal sinuses are clear.   Original Report Authenticated By: Erskine Speed, M.D.     Impression/Plan:  1) Acute on Chronic Respiratory Failure The pt has emphysematous changes on CT chest, but only mild obstruction on PFT's.  CXR shows no PNA.  His echo shows diastolic dysfxn with mild dilatation of LA, and only minimal pulmonary htn.  I suspect  he has significant loss of lung surface are from emphysema >> airflow obstruction on PFT's.  There is no bronchospasm on exam today -will check either V/Q or CT angio to r/o PE -will diurese aggressively while in house, watching renal function closely.  2) Minimal copd by PFT's However, he does have significant emphysematous changes on ct chest.  Nothing to suggest acute exacerbation at  this point. 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  I suspect this is chronic and not an acute process.  Given his cough and purulent mucus currently, will treat as a sinusitis.  4) chronic cough I feel this is primarily upper airway in origin.  He had significant improvement with d/c ace, but continues.  I think there is a cyclical component to his cough, and may be due to sinusitis as well.       Barbaraann Share, M.D. 10/07/2012, 8:45 AM

## 2012-10-07 NOTE — Progress Notes (Signed)
Gave Brovana and pulmicort separate .

## 2012-10-07 NOTE — Progress Notes (Signed)
   CARE MANAGEMENT NOTE 10/07/2012  Patient:  [name],[name]   Account Number:  [id number]  Date Initiated:  10/07/2012  Documentation initiated by:  Jiles Crocker  Subjective/Objective Assessment:   ADMITTED WITH ASTHMA EXACERBATION     Action/Plan:   PATIENT IS FOLLOWED BY [facility];  LIVES AT HOME WITH SPOUSE; POSSIBLY NEED HHC AT DISCHARGE- DISEASE MANAGEMENT PROGRAM; CM FOLLOWING FOR DCP   Anticipated DC Date:  10/12/2012   Anticipated DC Plan:  HOME W HOME HEALTH SERVICES      DC Planning Services  CM consult              Status of service:  In process, will continue to follow Medicare Important Message given?  NA - LOS <3 / Initial given by admissions (If response is "NO", the following Medicare IM given date fields will be blank) Per UR Regulation:  Reviewed for med. necessity/level of care/duration of stay Comments:  10/07/2012- B Delphina Schum RN,BSN,MHA

## 2012-10-07 NOTE — Progress Notes (Signed)
ANTIBIOTIC CONSULT NOTE - INITIAL  Pharmacy Consult for Zosyn Indication: sinusitis  Allergies  Allergen Reactions  . Codeine Nausea And Vomiting    Patient Measurements: Height: 5\' 6"  (167.6 cm) Weight: 164 lb 12.8 oz (74.753 kg) IBW/kg (Calculated) : 63.8   Vital Signs: Temp: 98.5 F (36.9 C) (04/25 0604) Temp src: Oral (04/25 0604) BP: 134/69 mmHg (04/25 0604) Pulse Rate: 92 (04/25 0604) Intake/Output from previous day: 04/24 0701 - 04/25 0700 In: 123 [P.O.:120; I.V.:3] Out: 100 [Urine:100]  Labs:  Recent Labs  10/06/12 1705 10/07/12 0536  WBC 7.8 4.9  HGB 11.6* 11.6*  PLT 173 173  CREATININE 0.90 0.90   Estimated Creatinine Clearance: 54.2 ml/min (by C-G formula based on Cr of 0.9).  Microbiology: Recent Results (from the past 720 hour(s))  CULTURE, EXPECTORATED SPUTUM-ASSESSMENT     Status: None   Collection Time    10/07/12  6:31 AM      Result Value Range Status   Specimen Description SPU   Final   Special Requests NONE   Final   Sputum evaluation     Final   Value: THIS SPECIMEN IS ACCEPTABLE. RESPIRATORY CULTURE REPORT TO FOLLOW.   Report Status 10/07/2012 FINAL   Final    Medications:  Anti-infectives   Start     Dose/Rate Route Frequency Ordered Stop   10/06/12 1700  azithromycin (ZITHROMAX) 500 mg in dextrose 5 % 250 mL IVPB  Status:  Discontinued     500 mg 250 mL/hr over 60 Minutes Intravenous Every 24 hours 10/06/12 1628 10/07/12 0905     Assessment: 87 yoM admit 4/24 with acute asthma exacerbation, cough, possible acute sinusitis or bronchitis.  Per MD notes, no pneumonia seen on CXR.  Azithromycin d/c (4/24 > 4/25)  SCr is wnl, CrCl ~ 54 ml/min  WBC are wnl  Respiratory culture (4/25) is pending.  Goal of Therapy:  Appropriate abx dosing, eradication of infection.  Plan:   Zosyn 3.375g IV Q8H infused over 4hrs.  Follow up renal function and cultures as available.  Lynann Beaver PharmD, BCPS Pager [phone] 10/07/2012  9:46 AM

## 2012-10-08 LAB — BASIC METABOLIC PANEL
GFR calc Af Amer: 64 mL/min — ABNORMAL LOW (ref >90)
GFR calc non Af Amer: 55 mL/min — ABNORMAL LOW (ref >90)
Glucose, Bld: 137 mg/dL — ABNORMAL HIGH (ref 70–99)
Potassium: 3.3 mEq/L — ABNORMAL LOW (ref 3.5–5.1)
Sodium: 143 mEq/L (ref 135–145)

## 2012-10-08 LAB — GLUCOSE, CAPILLARY: Glucose-Capillary: 154 mg/dL — ABNORMAL HIGH (ref 70–99)

## 2012-10-08 MED ORDER — FUROSEMIDE 10 MG/ML IJ SOLN
20.0000 mg | Freq: Every day | INTRAMUSCULAR | Status: DC
  Administered 2012-10-09 – 2012-10-10 (×2): 20 mg via INTRAVENOUS
  Filled 2012-10-08 (×2): qty 2

## 2012-10-08 NOTE — Progress Notes (Signed)
Subjective: Feels a little better today.  No increased wob.  Cough better, but does pop up at times.  Objective: Vital signs in last 24 hours: Blood pressure 127/66, pulse 85, temperature 98.4 F (36.9 C), temperature source Oral, resp. rate 18, height 5\' 6"  (1.676 m), weight 73.1 kg (161 lb 2.5 oz), SpO2 95.00%.  Intake/Output from previous day: 04/25 0701 - 04/26 0700 In: 1063 [P.O.:360; I.V.:603; IV Piggyback:100] Out: 1325 [Urine:1325]   Physical Exam:   wd male in nad Nose without purulence or discharge noted. Op clear Neck without LN or TMG Chest with mild decrease in bs, totally clear o/w  Cor with rrr Abd benign LE without edema, no cyanosis Alert and oriented, moves all 4.    Lab Results:  Recent Labs  10/06/12 1705 10/07/12 0536  WBC 7.8 4.9  HGB 11.6* 11.6*  HCT 33.7* 34.4*  PLT 173 173   BMET  Recent Labs  10/06/12 1705 10/07/12 0536 10/08/12 0516  NA 134* 135 143  K 3.1* 4.0 3.3*  CL 95* 98 101  CO2 29 30 30   GLUCOSE 113* 158* 137*  BUN 17 19 26*  CREATININE 0.90 0.90 1.17  CALCIUM 9.0 9.1 9.0    Studies/Results: X-ray Chest Pa And Lateral   10/07/2012  *RADIOLOGY REPORT*  Clinical Data: Cough, wheezing  CHEST - 2 VIEW  Comparison: Prior chest x-ray 10/03/2012  Findings: A 12 mm left lower lobe nodular opacity seen in both the frontal and lateral view with a suggestion of peripheral spiculation concerning for pulmonary nodule. Of note, even in retrospect this nodular opacity is not well seen on the recent prior x-ray.  Additionally, there is mild bibasilar atelectasis. The lungs are otherwise hyperexpanded but clear.  There is central airway thickening/peribronchial cuffing.  No pulmonary edema, focal airspace consolidation, pneumothorax or large effusion.  Cardiac and mediastinal contours are unchanged.  Aortic atherosclerosis is noted.  No acute osseous abnormality.  IMPRESSION:  1. Probable 12 mm left lower lobe pulmonary nodule.  Recommend  further evaluation with CT scan of the chest. 2.  Mild bibasilar atelectasis 3.  COPD 4.  Aortic atherosclerosis  These results will be called to the ordering clinician or representative by the Radiologist Assistant, and communication documented in the PACS Dashboard.   Original Report Authenticated By: Malachy Moan, M.D.    Ct Angio Chest Pe W/cm &/or Wo Cm  10/07/2012  *RADIOLOGY REPORT*  Clinical Data: Short of breath  CT ANGIOGRAPHY CHEST  Technique:  Multidetector CT imaging of the chest using the standard protocol during bolus administration of intravenous contrast. Multiplanar reconstructed images including MIPs were obtained and reviewed to evaluate the vascular anatomy.  Contrast: OMNIPAQUE IOHEXOL 350 MG/ML SOLN  Comparison: None.  Findings: There are no filling defects in the pulmonary arterial tree to suggest acute pulmonary thromboembolism.  Atherosclerotic changes of the aortic arch are noted.  There is ectasia of the right subclavian artery measuring 16 mm in caliber. Left anterior descending coronary artery calcifications.  The no abnormal mediastinal adenopathy.  No pericardial effusion.  No pleural effusion.  No pneumothorax.  There is a suspicious 11 x 7 mm spiculated density in the left upper lobe on image 42.  There is patchy ground-glass in the anterior left upper lobe on image 47.  Subsegmental atelectasis in the right middle lobe. Linear atelectasis in the right lower lobe.  No destructive bone lesion or acute bony deformity.  IMPRESSION: No evidence of acute pulmonary thromboembolism.  11 mm  suspicious spiculated density in the left upper lobe.  PET CT is recommended.  Anterior left upper lobe patchy ground-glass. Adenocarcinoma cannot be excluded.  Followup by CT is recommended in 12 months, with continued annual surveillance for a minimum of 3 years.  These recommendations are taken from "Recommendations for the Management of Subsolid Pulmonary Nodules Detected at CT:  A  Statement from the Fleischner Society" Radiology 2013; 266:1, 304- 317.   Original Report Authenticated By: Jolaine Click, M.D.    Ct Maxillofacial Ltd Wo Cm  10/07/2012  *RADIOLOGY REPORT*  Clinical Data:  79 year old male with cough and wheezing.  CT PARANASAL SINUS LIMITED WITHOUT CONTRAST  Technique:  Multidetector CT images of the paranasal sinuses were obtained in a single plane without contrast.  Comparison:   None.  Findings:  Negative for age visualized noncontrast brain parenchyma.  Postoperative changes to the globes.  Negative visualized deep soft tissue spaces of the face.  The visible mastoids and tympanic cavities are clear.  The sphenoid sinuses are clear. The frontal sinuses are clear. There is minimal to mild bilateral ethmoid air cell mucosal thickening. The right maxillary sinuses clear. The left maxillary sinus is opacified and also somewhat hypoplastic.  Leftward nasal septal deviation and spurring. No acute osseous abnormality identified.  IMPRESSION: 1.  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 2.  Other paranasal sinuses are clear.   Original Report Authenticated By: Erskine Speed, M.D.     Impression/Plan:  1) Acute on Chronic Respiratory Failure The pt has minimal emphysematous changes on most recent CT chest, and only minimal obstruction on PFT's.  CXR shows no PNA, but ct shows some GGO in lingula of unknown significance.  His echo shows diastolic dysfxn with mild dilatation of LA, and only minimal pulmonary htn.  I wonder if he has loss of lung surface area from emphysema >> airflow obstruction on PFT's?  CT without PE  -consider checking TCD bubble study to r/o shunt. -will diurese as renal function tolerates while in house, watching bmet closely.  2) Minimal copd by PFT's However, he does have some emphysematous changes on ct chest.  Nothing to suggest acute exacerbation at this point. 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  I suspect this is chronic and not an acute process.  Given his cough and purulent mucus currently, will treat as a sinusitis.  4) chronic cough I feel this is primarily upper airway in origin.  He had significant improvement with d/c ace, but continues.  I think there is a cyclical component to his cough, and may be due to sinusitis as well.       Barbaraann Share, M.D. 10/08/2012, 1:09 PM

## 2012-10-09 LAB — BASIC METABOLIC PANEL
BUN: 35 mg/dL — ABNORMAL HIGH (ref 6–23)
Chloride: 99 mEq/L (ref 96–112)
GFR calc Af Amer: 66 mL/min — ABNORMAL LOW (ref >90)
Potassium: 3 mEq/L — ABNORMAL LOW (ref 3.5–5.1)
Sodium: 140 mEq/L (ref 135–145)

## 2012-10-09 LAB — GLUCOSE, CAPILLARY: Glucose-Capillary: 104 mg/dL — ABNORMAL HIGH (ref 70–99)

## 2012-10-09 LAB — CULTURE, RESPIRATORY W GRAM STAIN: Culture: NORMAL

## 2012-10-09 MED ORDER — METHYLPREDNISOLONE SODIUM SUCC 40 MG IJ SOLR
40.0000 mg | Freq: Every day | INTRAMUSCULAR | Status: DC
  Administered 2012-10-10: 40 mg via INTRAVENOUS
  Filled 2012-10-09: qty 1

## 2012-10-09 MED ORDER — POTASSIUM CHLORIDE CRYS ER 20 MEQ PO TBCR
40.0000 meq | EXTENDED_RELEASE_TABLET | Freq: Three times a day (TID) | ORAL | Status: AC
  Administered 2012-10-09 (×3): 40 meq via ORAL
  Filled 2012-10-09 (×3): qty 2

## 2012-10-09 NOTE — Progress Notes (Signed)
ANTIBIOTIC CONSULT NOTE - follow-up  Pharmacy Consult for Zosyn Indication: sinusitis  Allergies  Allergen Reactions  . Codeine Nausea And Vomiting    Patient Measurements: Height: 5\' 6"  (167.6 cm) Weight: 163 lb 12.8 oz (74.3 kg) IBW/kg (Calculated) : 63.8   Vital Signs: Temp: 98.4 F (36.9 C) (04/27 0500) Temp src: Oral (04/27 0500) BP: 133/73 mmHg (04/27 0500) Pulse Rate: 85 (04/27 0500) Intake/Output from previous day: 04/26 0701 - 04/27 0700 In: 1640 [P.O.:1320; I.V.:170; IV Piggyback:150] Out: 1750 [Urine:1750]  Labs:  Recent Labs  10/06/12 1705 10/07/12 0536 10/08/12 0516 10/09/12 0453  WBC 7.8 4.9  --   --   HGB 11.6* 11.6*  --   --   PLT 173 173  --   --   CREATININE 0.90 0.90 1.17 1.13   Estimated Creatinine Clearance: 43.1 ml/min (by C-G formula based on Cr of 1.13).  Microbiology: Recent Results (from the past 720 hour(s))  CULTURE, EXPECTORATED SPUTUM-ASSESSMENT     Status: None   Collection Time    10/07/12  6:31 AM      Result Value Range Status   Specimen Description SPU   Final   Special Requests NONE   Final   Sputum evaluation     Final   Value: THIS SPECIMEN IS ACCEPTABLE. RESPIRATORY CULTURE REPORT TO FOLLOW.   Report Status 10/07/2012 FINAL   Final  CULTURE, RESPIRATORY (NON-EXPECTORATED)     Status: None   Collection Time    10/07/12  6:31 AM      Result Value Range Status   Specimen Description SPUTUM   Final   Special Requests NONE   Final   Gram Stain     Final   Value: MODERATE WBC PRESENT,BOTH PMN AND MONONUCLEAR     FEW SQUAMOUS EPITHELIAL CELLS PRESENT     FEW GRAM POSITIVE COCCI     IN PAIRS IN CHAINS RARE GRAM POSITIVE RODS   Culture NORMAL OROPHARYNGEAL FLORA   Final   Report Status 10/09/2012 FINAL   Final    Medications:  Anti-infectives   Start     Dose/Rate Route Frequency Ordered Stop   10/07/12 1000  piperacillin-tazobactam (ZOSYN) IVPB 3.375 g     3.375 g 12.5 mL/hr over 240 Minutes Intravenous Every 8  hours 10/07/12 0949     10/06/12 1700  azithromycin (ZITHROMAX) 500 mg in dextrose 5 % 250 mL IVPB  Status:  Discontinued     500 mg 250 mL/hr over 60 Minutes Intravenous Every 24 hours 10/06/12 1628 10/07/12 0905     Assessment: 87 yoM admit 4/24 with acute asthma exacerbation, cough, possible acute sinusitis or bronchitis.  Per MD notes, no pneumonia seen on CXR.  4/24 >> Azith >> 4/25 4/25 >> Zosyn >>   Tmax: afeb WBCs: wnl Renal: Sr=1.13 for est CrCl=28ml/min  4/25 sputum: Nl flora  Goal of Therapy:  Appropriate abx dosing, eradication of infection.  Plan:  Day #3 Zosyn  Continue Zosyn 3.375g IV Q8H infused over 4hrs for possible sinsusitis and/or bronchitis  Follow up renal function   Juliette Alcide, PharmD, BCPS.   Pager: [phone] 10/09/2012 12:02 PM

## 2012-10-09 NOTE — Progress Notes (Signed)
Subjective: Doing better.  Walking in halls with significantly improved dyspnea.  Cough present at times, but not as prominent.   Objective: Vital signs in last 24 hours: Blood pressure 133/73, pulse 85, temperature 98.4 F (36.9 C), temperature source Oral, resp. rate 18, height 5\' 6"  (1.676 m), weight 74.3 kg (163 lb 12.8 oz), SpO2 91.00%.  Intake/Output from previous day: 04/26 0701 - 04/27 0700 In: 1640 [P.O.:1320; I.V.:170; IV Piggyback:150] Out: 1750 [Urine:1750]   Physical Exam:   wd male in nad Nose without purulence or discharge noted. Op clear Neck without LN or TMG Chest with mild decrease in bs, totally clear o/w with no wheezing  Cor with rrr Abd benign LE without edema, no cyanosis Alert and oriented, moves all 4.    Lab Results:  Recent Labs  10/06/12 1705 10/07/12 0536  WBC 7.8 4.9  HGB 11.6* 11.6*  HCT 33.7* 34.4*  PLT 173 173   BMET  Recent Labs  10/07/12 0536 10/08/12 0516 10/09/12 0453  NA 135 143 140  K 4.0 3.3* 3.0*  CL 98 101 99  CO2 30 30 34*  GLUCOSE 158* 137* 125*  BUN 19 26* 35*  CREATININE 0.90 1.17 1.13  CALCIUM 9.1 9.0 8.8    Studies/Results: No results found.  Impression/Plan:  1) Acute on Chronic Respiratory Failure The pt has minimal emphysematous changes on most recent CT chest, and only minimal obstruction on PFT's.  CXR shows no PNA, but ct shows some GGO in lingula of unknown significance.  His echo shows diastolic dysfxn with mild dilatation of LA, and only minimal pulmonary htn.  I wonder if he has loss of lung surface area from emphysema >> airflow obstruction on PFT's?  CT without PE  -consider checking TCD bubble study to r/o shunt? -will diurese as renal function tolerates while in house, watching bmet closely.  2) Minimal copd by PFT's However, he does have some emphysematous changes on ct chest.  Nothing to suggest acute exacerbation at this point. 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  I suspect this is chronic and not an acute process.  Given his cough and purulent mucus currently, will treat as a sinusitis.  4) chronic cough I feel this is primarily upper airway in origin.  He had significant improvement with d/c ace, but continues.  I think there is a cyclical component to his cough, and may be due to sinusitis as well.   -continue bid PPI -continue cough suppression.   5) Pulmonary nodule LUL unknown significance Given pt's age and ongoing issues currently, would favor doing followup ct chest in 4 mo to see if changing in size.      Barbaraann Share, M.D. 10/09/2012, 2:04 PM

## 2012-10-10 ENCOUNTER — Telehealth: Payer: Self-pay | Admitting: *Deleted

## 2012-10-10 LAB — BASIC METABOLIC PANEL
Calcium: 8.5 mg/dL (ref 8.4–10.5)
GFR calc Af Amer: 73 mL/min — ABNORMAL LOW (ref >90)
GFR calc non Af Amer: 63 mL/min — ABNORMAL LOW (ref >90)
Sodium: 139 mEq/L (ref 135–145)

## 2012-10-10 MED ORDER — ARFORMOTEROL TARTRATE 15 MCG/2ML IN NEBU: 15.0000 ug | INHALATION_SOLUTION | Freq: Two times a day (BID) | RESPIRATORY_TRACT | Status: DC

## 2012-10-10 MED ORDER — HYDROCODONE-HOMATROPINE 5-1.5 MG/5ML PO SYRP: 5.0000 mL | ORAL_SOLUTION | ORAL | Status: DC | PRN

## 2012-10-10 MED ORDER — AMOXICILLIN-POT CLAVULANATE 875-125 MG PO TABS: 1.0000 | ORAL_TABLET | Freq: Two times a day (BID) | ORAL | Status: DC

## 2012-10-10 MED ORDER — ESOMEPRAZOLE MAGNESIUM 40 MG PO CPDR: 40.0000 mg | DELAYED_RELEASE_CAPSULE | Freq: Two times a day (BID) | ORAL | Status: DC

## 2012-10-10 MED ORDER — FUROSEMIDE 20 MG PO TABS: 40.0000 mg | ORAL_TABLET | Freq: Every day | ORAL | Status: DC

## 2012-10-10 MED ORDER — OMEPRAZOLE 40 MG PO CPDR: 40.0000 mg | DELAYED_RELEASE_CAPSULE | Freq: Two times a day (BID) | ORAL | Status: DC

## 2012-10-10 MED ORDER — PREDNISONE 10 MG PO TABS: ORAL_TABLET | ORAL | Status: DC

## 2012-10-10 NOTE — Telephone Encounter (Signed)
LMOM x 1  Pt needs appt with [facility] within next week--- open slot 10/21/12

## 2012-10-10 NOTE — Discharge Summary (Signed)
Agree with above.  Please see my full note from earlier this am.

## 2012-10-10 NOTE — Progress Notes (Signed)
Subjective: Doing better.  Walking in halls with significantly improved dyspnea.  Cough much improved.   Objective: Vital signs in last 24 hours: Blood pressure 147/74, pulse 77, temperature 98.1 F (36.7 C), temperature source Oral, resp. rate 18, height 5\' 6"  (1.676 m), weight 74.8 kg (164 lb 14.5 oz), SpO2 93.00%.  Intake/Output from previous day: 04/27 0701 - 04/28 0700 In: 2020 [P.O.:1680; I.V.:240; IV Piggyback:100] Out: 1700 [Urine:1700]   Physical Exam:   wd male in nad Nose without purulence or discharge noted. Op clear Neck without LN or TMG Chest with mild decrease in bs, totally clear o/w with no wheezing  Cor with rrr Abd benign LE without edema, no cyanosis Alert and oriented, moves all 4.    Lab Results: No results found for this basename: WBC, HGB, HCT, PLT,  in the last 72 hours BMET  Recent Labs  10/08/12 0516 10/09/12 0453 10/10/12 0440  NA 143 140 139  K 3.3* 3.0* 4.0  CL 101 99 102  CO2 30 34* 33*  GLUCOSE 137* 125* 98  BUN 26* 35* 32*  CREATININE 1.17 1.13 1.05  CALCIUM 9.0 8.8 8.5    Studies/Results: No results found.  Impression/Plan:  1) Acute on Chronic Respiratory Failure The pt has minimal emphysematous changes on most recent CT chest, and only minimal obstruction on PFT's.  CXR shows no PNA, but ct shows some GGO in lingula of unknown significance (?pna).  His echo shows diastolic dysfxn with mild dilatation of LA, and only minimal pulmonary htn.  I wonder if he has loss of lung surface area from emphysema >> airflow obstruction on PFT's?  CT without PE  -d/c home today -he has definitely benefited from diuresis, and will continue lasix as outpt.   2) Minimal copd by PFT's However, he does have some emphysematous changes on ct chest.  Nothing to suggest acute exacerbation at this point. -taper steroids as outpt. -will d/c home on BD without ICS, since this can irritate upper airway and worsen cough.    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  I suspect this is chronic and not an acute process.  Given his cough and purulent mucus currently, will treat as a sinusitis.  4) chronic cough I feel this is primarily upper airway in origin.  He had significant improvement with d/c ace, but continues.  I think there is a cyclical component to his cough, and may be due to sinusitis as well.   -continue bid PPI -continue cough suppression.  -finish treatment of sinusitis  5) Pulmonary nodule LUL unknown significance Given pt's age and ongoing issues currently, would favor doing followup ct chest in 4 mo to see if changing in size.   Pt is ready for discharge.  Would send home on lasix 20mg  po qd, augmentin 875 bid for another 10 days (on full stomach with large glass of water), bid PPI, hycodan cough syrup (6 ounces, no fills), 2 lpm oxygen 24hrs/day, prednisone taper over 8 days.  He is to stop his bronchodilators at home, and have him go by office to get samples of ANORO one inhalation each am only.  Need to see him back in one week for f/u.    Barbaraann Share, M.D. 10/10/2012, 8:57 AM

## 2012-10-10 NOTE — Discharge Summary (Signed)
Physician Discharge Summary     Patient ID: [name] Plant Sr. MRN: [id number] DOB/AGE: [birth year] 79 y.o.  Admit date: 10/06/2012 Discharge date: 10/10/2012  Admission Diagnoses: Dyspnea and cough Discharge Diagnoses:  Active Problems:  acute on chronic respiratory failure COPD Acute sinusitis  Chronic cough  Significant Hospital tests/ studies/ interventions and procedures   Hospital Course:  1) Acute on Chronic Respiratory Failure  The pt has minimal emphysematous changes on most recent CT chest, and only minimal obstruction on PFT's. CXR shows no PNA, but ct shows some GGO in lingula of unknown significance (?pna). His echo shows diastolic dysfxn with mild dilatation of LA, and only minimal pulmonary htn. I wonder if he has loss of lung surface area from emphysema >> airflow obstruction on PFT's? CT without PE. He was admitted and treated in a broad spectrum approach which included: diuresis, oxygen, treatment for sinusitis and cough suppression.  -he has definitely benefited from diuresis, and will continue lasix as outpt.   2) Minimal copd by PFT's  However, he does have some emphysematous changes on ct chest. Nothing to suggest acute exacerbation at this point.  -taper steroids as outpt.  -will d/c home on BD without ICS, since this can irritate upper airway and worsen cough.   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 I suspect this is chronic and not an acute process. Given his cough and purulent mucus currently, will treat as a sinusitis.   4) chronic cough  I feel this is primarily upper airway in origin. He had significant improvement with d/c ace, but continues. I think there is a cyclical component to his cough, and may be due to sinusitis as well.  -continue bid PPI  -continue cough suppression.  -finish treatment of sinusitis  Discharge Exam: BP 147/74  Pulse 77  Temp(Src) 98.1 F (36.7 C) (Oral)  Resp 18  Ht 5'  6" (1.676 m)  Wt 74.8 kg (164 lb 14.5 oz)  BMI 26.63 kg/m2  SpO2 93% 2 liters  wd male in nad  Nose without purulence or discharge noted.  Op clear  Neck without LN or TMG  Chest with mild decrease in bs, totally clear o/w with no wheezing  Cor with rrr  Abd benign  LE without edema, no cyanosis  Alert and oriented, moves all 4.   Labs at discharge Lab Results  Component Value Date   CREATININE 1.05 10/10/2012   BUN 32* 10/10/2012   NA 139 10/10/2012   K 4.0 10/10/2012   CL 102 10/10/2012   CO2 33* 10/10/2012   Lab Results  Component Value Date   WBC 4.9 10/07/2012   HGB 11.6* 10/07/2012   HCT 34.4* 10/07/2012   MCV 88.0 10/07/2012   PLT 173 10/07/2012   Lab Results  Component Value Date   ALT 17 10/06/2012   AST 29 10/06/2012   ALKPHOS 67 10/06/2012   BILITOT 0.4 10/06/2012   No results found for this basename: INR,  PROTIME    Current radiology studies No results found.  Disposition:  Final discharge disposition not confirmed      Discharge Orders   Future Appointments Provider Department Dept Phone   12/27/2012 10:00 AM Barbaraann Share, MD [facility] [phone]   Future Orders Complete By Expires     (HEART FAILURE PATIENTS) Call MD:  Anytime you have any of the following symptoms: 1) 3 pound weight gain in 24 hours or 5 pounds in  1 week 2) shortness of breath, with or without a dry hacking cough 3) swelling in the hands, feet or stomach 4) if you have to sleep on extra pillows at night in order to breathe.  As directed     Call MD for:  persistant dizziness or light-headedness  As directed     Call MD for:  temperature >100.4  As directed     Diet - low sodium heart healthy  As directed     For home use only DME oxygen  As directed     Questions:      Mode or (Route):  Nasal cannula    Liters per Minute:  2    Frequency:      Oxygen conserving device:      Increase activity slowly  As directed         Medication List    STOP taking these  medications       budesonide-formoterol 160-4.5 MCG/ACT inhaler  Commonly known as:  SYMBICORT     omeprazole 40 MG capsule  Commonly known as:  PRILOSEC      TAKE these medications       amoxicillin-clavulanate 875-125 MG per tablet  Commonly known as:  AUGMENTIN  Take 1 tablet by mouth 2 (two) times daily.     arformoterol 15 MCG/2ML Nebu  Commonly known as:  BROVANA  Take 2 mLs (15 mcg total) by nebulization 2 (two) times daily.     diltiazem 120 MG 24 hr capsule  Commonly known as:  TIAZAC  Take 120 mg by mouth daily.     esomeprazole 40 MG capsule  Commonly known as:  NEXIUM  Take 1 capsule (40 mg total) by mouth 2 (two) times daily. Take before breakfast and before bed     finasteride 5 MG tablet  Commonly known as:  PROSCAR  Take 5 mg by mouth daily.     furosemide 20 MG tablet  Commonly known as:  LASIX  Take 2 tablets (40 mg total) by mouth daily.     HYDROcodone-homatropine 5-1.5 MG/5ML syrup  Commonly known as:  HYCODAN  Take 5 mLs by mouth every 4 (four) hours as needed for cough.     ipratropium-albuterol 0.5-2.5 (3) MG/3ML Soln  Commonly known as:  DUONEB  Take 3 mLs by nebulization every 6 (six) hours as needed (for shortness of breath).     levothyroxine 75 MCG tablet  Commonly known as:  SYNTHROID, LEVOTHROID  Take 75 mcg by mouth daily.     predniSONE 10 MG tablet  Commonly known as:  DELTASONE  Take 4 tabs  daily with food x 4 days, then 3 tabs daily x 4 days, then 2 tabs daily x 4 days, then 1 tab daily x4 days then stop. #40     tamsulosin 0.4 MG Caps  Commonly known as:  FLOMAX  Take 0.4 mg by mouth daily.     vitamin B-12 1000 MCG tablet  Commonly known as:  CYANOCOBALAMIN  Take 1,000 mcg by mouth daily.       Follow-up Information   Follow up with Barbaraann Share, MD On 10/10/2012.   Contact information:   [address] [place] [address] [phone]       Discharged Condition: good  Physician Statement:   The Patient  was personally examined, the discharge assessment and plan has been personally reviewed and I agree with ACNP Dushawn Pusey's assessment and plan. > 30 minutes of time have been  dedicated to discharge assessment, planning and discharge instructions.   Signed: Tolulope Pinkett,PETE 10/10/2012, 11:15 AM

## 2012-10-13 NOTE — Telephone Encounter (Signed)
Pt scheduled for 5/9 at 215 for appt with [facility].  Philipp Deputy (granddaughter) to make patient aware of appt date and time.  Nothing further needed.

## 2012-10-21 ENCOUNTER — Ambulatory Visit (INDEPENDENT_AMBULATORY_CARE_PROVIDER_SITE_OTHER): Payer: Medicare Other | Admitting: Pulmonary Disease

## 2012-10-21 ENCOUNTER — Other Ambulatory Visit (INDEPENDENT_AMBULATORY_CARE_PROVIDER_SITE_OTHER): Payer: Medicare Other

## 2012-10-21 ENCOUNTER — Encounter: Payer: Self-pay | Admitting: Pulmonary Disease

## 2012-10-21 VITALS — BP 128/62 | HR 70 | Temp 98.3°F | Ht 66.0 in | Wt 168.4 lb

## 2012-10-21 DIAGNOSIS — R05 Cough: Secondary | ICD-10-CM

## 2012-10-21 DIAGNOSIS — J439 Emphysema, unspecified: Secondary | ICD-10-CM

## 2012-10-21 DIAGNOSIS — I5189 Other ill-defined heart diseases: Secondary | ICD-10-CM | POA: Insufficient documentation

## 2012-10-21 DIAGNOSIS — I519 Heart disease, unspecified: Secondary | ICD-10-CM

## 2012-10-21 DIAGNOSIS — J438 Other emphysema: Secondary | ICD-10-CM

## 2012-10-21 DIAGNOSIS — R059 Cough, unspecified: Secondary | ICD-10-CM

## 2012-10-21 LAB — BASIC METABOLIC PANEL
CO2: 27 mEq/L (ref 19–32)
Calcium: 9.1 mg/dL (ref 8.4–10.5)
Creatinine, Ser: 1.4 mg/dL (ref 0.4–1.5)
GFR: 53.34 mL/min — ABNORMAL LOW (ref >60.00)

## 2012-10-21 MED ORDER — UMECLIDINIUM-VILANTEROL 62.5-25 MCG/INH IN AEPB: 1.0000 | INHALATION_SPRAY | Freq: Every day | RESPIRATORY_TRACT | Status: DC

## 2012-10-21 MED ORDER — FUROSEMIDE 20 MG PO TABS: 40.0000 mg | ORAL_TABLET | Freq: Every day | ORAL | Status: DC

## 2012-10-21 NOTE — Patient Instructions (Addendum)
Stay on your fluid pill for now.  Will check bloodwork today and let you know about your dosing. Stay on Anoro one inhalation each am.  Use nebulizer only if having shortness of breath that is concerning to you. Let me know if your cough worsens Ok to discontinue oxygen during day , but would continue sleeping with it for now. Would like to see you back in 6 weeks to check on progress.

## 2012-10-21 NOTE — Assessment & Plan Note (Signed)
Will continue pt on diuretics, and check BMET today.

## 2012-10-21 NOTE — Assessment & Plan Note (Signed)
The patient has minimal airflow obstruction on PFTs, but significant improvement with bronchodilators in his forced vital capacity.  He has done fairly well on Anoro, and I would like to continue him on this.  I would like to avoid ICS for now given his cough.  His sats today are adequate, and will d/c oxygen except with sleep.

## 2012-10-21 NOTE — Assessment & Plan Note (Signed)
The patient has had a chronic cough that I suspect is multifactorial.  I think the majority of this is coming from his upper airway, and related to possible sinusitis and postnasal drip.  I also think part of his cough was secondary to his pulmonary process at the time of admission to the hospital.  He is significantly improved, but continues to have an occasional rattle in his upper airway by exam.

## 2012-10-21 NOTE — Progress Notes (Signed)
  Subjective:    Patient ID: [name] Plant Sr., male    DOB: [birth year], 79 y.o.   MRN: [id number]  HPI The patient comes in today for a post hospital visit.  He was admitted with worsening cough, chest congestion, and increased shortness of breath.  He was found by CT chest to have a few groundglass opacities, worrisome for possible pneumonia.  He also had a CT of his sinuses that showed a hypoplastic maxillary sinus that was fluid-filled.  He was treated with antibiotics for both his pulmonary abnormalities, and possible sinusitis.  He was also treated with a course of steroids, and also diuresed for probable diastolic dysfunction.  He had significant improvement by discharge day, and comes in today for followup.  He feels that he is breathing fairly well, and is not wearing oxygen currently with adequate saturations at rest.  His cough is minimal at this time, but does bother him sometimes.  He denies any significant postnasal drip.  He feels that his breathing is nearly back to baseline.   Review of Systems  Constitutional: Negative for fever and unexpected weight change.  HENT: Positive for sneezing. Negative for ear pain, nosebleeds, congestion, sore throat, rhinorrhea, trouble swallowing, dental problem, postnasal drip and sinus pressure.   Eyes: Negative for redness and itching.  Respiratory: Positive for cough and wheezing. Negative for chest tightness and shortness of breath.   Cardiovascular: Negative for palpitations and leg swelling.  Gastrointestinal: Negative for nausea and vomiting.  Genitourinary: Negative for dysuria.  Musculoskeletal: Negative for joint swelling.  Skin: Negative for rash.  Neurological: Negative for headaches.  Hematological: Does not bruise/bleed easily.  Psychiatric/Behavioral: Negative for dysphoric mood. The patient is not nervous/anxious.        Objective:   Physical Exam Well-developed male in no acute distress Nose without purulence or discharge  noted Oropharynx clear Neck without lymphadenopathy or thyromegaly Chest with fairly clear breath sounds, mild upper airway noise noted.  No wheezing Cardiac exam with regular rate and rhythm Lower extremities with mild ankle edema, no cyanosis Alert and oriented, moves all 4 extremities.       Assessment & Plan:

## 2012-10-21 NOTE — Addendum Note (Signed)
Addended by: Nita Sells on: 10/21/2012 02:59 PM   Modules accepted: Orders

## 2012-10-24 ENCOUNTER — Other Ambulatory Visit: Payer: Self-pay | Admitting: Pulmonary Disease

## 2012-10-24 LAB — BASIC METABOLIC PANEL
BUN: 24 mg/dL — ABNORMAL HIGH (ref 6–23)
Creat: 1.27 mg/dL (ref 0.50–1.35)
Potassium: 4.9 mEq/L (ref 3.5–5.3)

## 2012-11-24 ENCOUNTER — Telehealth: Payer: Self-pay | Admitting: Pulmonary Disease

## 2012-11-29 NOTE — Telephone Encounter (Signed)
Pt has OV scheduled with [facility] in July.

## 2012-12-02 ENCOUNTER — Ambulatory Visit: Payer: Medicare Other | Admitting: Pulmonary Disease

## 2012-12-06 ENCOUNTER — Encounter: Payer: Self-pay | Admitting: Pulmonary Disease

## 2012-12-06 ENCOUNTER — Ambulatory Visit (INDEPENDENT_AMBULATORY_CARE_PROVIDER_SITE_OTHER)
Admission: RE | Admit: 2012-12-06 | Discharge: 2012-12-06 | Disposition: A | Discharge status: Home / Self Care | Payer: Medicare Other | Source: Ambulatory Visit | Attending: Pulmonary Disease | Admitting: Pulmonary Disease

## 2012-12-06 ENCOUNTER — Ambulatory Visit (INDEPENDENT_AMBULATORY_CARE_PROVIDER_SITE_OTHER): Payer: Medicare Other | Admitting: Pulmonary Disease

## 2012-12-06 VITALS — BP 124/66 | HR 67 | Temp 96.1°F | Ht 66.0 in | Wt 171.0 lb

## 2012-12-06 DIAGNOSIS — J438 Other emphysema: Secondary | ICD-10-CM

## 2012-12-06 DIAGNOSIS — J439 Emphysema, unspecified: Secondary | ICD-10-CM

## 2012-12-06 DIAGNOSIS — I519 Heart disease, unspecified: Secondary | ICD-10-CM

## 2012-12-06 DIAGNOSIS — I5189 Other ill-defined heart diseases: Secondary | ICD-10-CM

## 2012-12-06 NOTE — Assessment & Plan Note (Signed)
The patient is having increased lower extremity edema since the last visit, and he has primarily been taking one Lasix rather than two on a consistent basis.  He has had issues with renal insufficiency and hyperkalemia in the past, and we therefore have to be careful about over diuresing.  I have asked him to take 2 tablets of the next 3 days, and he is to call us about his weight.  We'll also arrange for a cardiology consultation for further management of probable diastolic heart failure.

## 2012-12-06 NOTE — Progress Notes (Signed)
  Subjective:    Patient ID: [name] Plant Sr., male    DOB: 06/23/1924, 79 y.o.   MRN: [id number]  HPI Patient comes in today for followup of his known mild COPD, and probable chronic diastolic dysfunction.  He had been doing well on his current bronchodilator regimen, with minimal cough.  He has been taking Lasix only once a day, but most recently began to take it twice a day because of increasing lower extremity edema.  With the increase in edema, he has seen an increase in his cough that had resolved at the last visit.  He feels that his dyspnea on exertion is at baseline.  He denies any significant chest congestion or mucus production.   Review of Systems  Constitutional: Negative for fever and unexpected weight change.  HENT: Negative for ear pain, nosebleeds, congestion, sore throat, rhinorrhea, sneezing, trouble swallowing, dental problem, postnasal drip and sinus pressure.   Eyes: Negative for redness and itching.  Respiratory: Positive for cough. Negative for chest tightness, shortness of breath and wheezing.   Cardiovascular: Positive for leg swelling. Negative for palpitations.  Gastrointestinal: Negative for nausea and vomiting.  Genitourinary: Negative for dysuria.  Musculoskeletal: Negative for joint swelling.  Skin: Negative for rash.  Neurological: Negative for headaches.  Hematological: Does not bruise/bleed easily.  Psychiatric/Behavioral: Negative for dysphoric mood. The patient is not nervous/anxious.        Objective:   Physical Exam Well-developed male in no acute distress Nose without purulence or discharge noted Chest with a few polyps and rhonchi in the right midlung zone, otherwise clear Cardiac exam with regular rate and rhythm, 2/6 systolic murmur Lower extremities with 2+ pedal edema, no cyanosis Alert and oriented, moves all 4 extremities.       Assessment & Plan:

## 2012-12-06 NOTE — Assessment & Plan Note (Signed)
The patient seems to be doing fairly well from a COPD standpoint, and only has mild disease by his PFTs.  I would like him to continue on his current bronchodilator regimen for now.

## 2012-12-06 NOTE — Patient Instructions (Addendum)
Take 40mg  of lasix tomorrow, Thursday, and Friday am.  Please call Friday to let me know your weight for today and Friday.  We can then decide what to do with your fluid medicine dose. Will check cxr today, and call you with results. Would like to see you back in 3mos, but this may change depending upon how your breathing is doing.

## 2012-12-08 ENCOUNTER — Other Ambulatory Visit: Payer: Self-pay | Admitting: Pulmonary Disease

## 2012-12-08 DIAGNOSIS — J9610 Chronic respiratory failure, unspecified whether with hypoxia or hypercapnia: Secondary | ICD-10-CM

## 2012-12-08 DIAGNOSIS — J439 Emphysema, unspecified: Secondary | ICD-10-CM

## 2012-12-09 ENCOUNTER — Telehealth: Payer: Self-pay | Admitting: Pulmonary Disease

## 2012-12-09 NOTE — Telephone Encounter (Signed)
Weight 159 12/09/12 Down 1 # from Wed 12/07/12 Please advise re cont lasix thanks!   Per [facility]- cont lasix 2 tablets sat sun and Monday start 1 tablet on odd days and 2 tablets on even days  Recheck BMET per PCP on 1 wk from today   Pt aware of recs and verbalized understanding   Pt scheduled to see Dr Antoine Poche 12/20/12

## 2012-12-20 ENCOUNTER — Encounter: Payer: Self-pay | Admitting: Cardiology

## 2012-12-20 ENCOUNTER — Ambulatory Visit (INDEPENDENT_AMBULATORY_CARE_PROVIDER_SITE_OTHER): Payer: Medicare Other | Admitting: Cardiology

## 2012-12-20 VITALS — BP 114/58 | HR 67 | Wt 165.0 lb

## 2012-12-20 DIAGNOSIS — I5189 Other ill-defined heart diseases: Secondary | ICD-10-CM

## 2012-12-20 DIAGNOSIS — I519 Heart disease, unspecified: Secondary | ICD-10-CM

## 2012-12-20 NOTE — Patient Instructions (Addendum)
The current medical regimen is effective;  continue present plan and medications.  Follow up as needed 

## 2012-12-20 NOTE — Progress Notes (Signed)
HPI The patient presents for evaluation of edema and probable diastolic heart failure. He has no past cardiac history. He apparently has some history of lung disease though this is felt to be mild. He does use O2 at night. He been having some increased edema. He recently had an echocardiogram which suggested a well preserved ejection fraction with some evidence of diastolic dysfunction. The pulmonary pressures were mildly elevated. There were no significant valvular abnormalities. He was referred for evaluation of this. He also had a syncopal episode. This happened Sunday in church. He went to stand up and apparently lost consciousness. He does not recall the events. He doesn't otherwise say that he has orthostatic symptoms. He doesn't notice palpitations. He doesn't describe any shortness of breath actually and says he walks daily. He denies any PND or orthopnea. He denies any chest pressure, neck or arm discomfort.   Allergies  Allergen Reactions  . Codeine Nausea And Vomiting    Current Outpatient Prescriptions  Medication Sig Dispense Refill  . diltiazem (TIAZAC) 120 MG 24 hr capsule Take 120 mg by mouth daily.      . finasteride (PROSCAR) 5 MG tablet Take 5 mg by mouth daily.      . furosemide (LASIX) 20 MG tablet Take 40 mg by mouth daily.       Marland Kitchen HYDROcodone-homatropine (HYCODAN) 5-1.5 MG/5ML syrup Take 5 mLs by mouth every 4 (four) hours as needed for cough.  120 mL  0  . ipratropium-albuterol (DUONEB) 0.5-2.5 (3) MG/3ML SOLN Take 3 mLs by nebulization every 6 (six) hours as needed (for shortness of breath).      Marland Kitchen levothyroxine (SYNTHROID, LEVOTHROID) 75 MCG tablet Take 75 mcg by mouth daily.      Marland Kitchen omeprazole (PRILOSEC) 40 MG capsule Take 40 mg by mouth daily. Before breakfast and bedtime      . tamsulosin (FLOMAX) 0.4 MG CAPS Take 0.4 mg by mouth daily.      Marland Kitchen Umeclidinium-Vilanterol (ANORO ELLIPTA) 62.5-25 MCG/INH AEPB Inhale 1 puff into the lungs daily.  30 each  12  . vitamin  B-12 (CYANOCOBALAMIN) 1000 MCG tablet Take 1,000 mcg by mouth daily.       No current facility-administered medications for this visit.    Past Medical History  Diagnosis Date  . HTN (hypertension)   . Seasonal allergies     Past Surgical History  Procedure Laterality Date  . Gallbladder surgery  02/21/2011  . Cataract extraction    . Other surgical history      finger right pointer--laceration repair  . Cholecystectomy      Family History  Problem Relation Age of Onset  . Other Father     enlarged heart  . Lung disease Mother   . COPD Sister   . Heart disease Sister     History   Social History  . Marital Status: Married    Spouse Name: N/A    Number of Children: 5  . Years of Education: N/A   Occupational History  . retired    Social History Main Topics  . Smoking status: Former Smoker -- 1.50 packs/day for 25 years    Types: Cigarettes    Quit date: 06/16/1971  . Smokeless tobacco: Never Used     Comment: started at 79 years old  . Alcohol Use: No  . Drug Use: No  . Sexually Active: No   Other Topics Concern  . Not on file   Social History Narrative  . No  narrative on file    ROS:  Positive for headache, dizziness, syncope, wheezing, reflux, swelling, edema. Otherwise as stated in the HPI and negative for all other systems.  PHYSICAL EXAM BP 114/58  Pulse 67  Wt 165 lb (74.844 kg)  BMI 26.64 kg/m2 GENERAL:  Well appearing HEENT:  Pupils equal round and reactive, fundi not visualized, oral mucosa unremarkable NECK:  No jugular venous distention, waveform within normal limits, carotid upstroke brisk and symmetric, no bruits, no thyromegaly LYMPHATICS:  No cervical, inguinal adenopathy LUNGS:  Clear to auscultation bilaterally BACK:  No CVA tenderness CHEST:  Unremarkable HEART:  PMI not displaced or sustained,S1 and S2 within normal limits, no S3, no S4, no clicks, no rubs, no murmurs ABD:  Flat, positive bowel sounds normal in frequency in  pitch, no bruits, no rebound, no guarding, no midline pulsatile mass, no hepatomegaly, no splenomegaly EXT:  2 plus pulses throughout, mild bilateral ankle edema, no cyanosis no clubbing SKIN:  No rashes no nodules NEURO:  Cranial nerves II through XII grossly intact, motor grossly intact throughout PSYCH:  Cognitively intact, oriented to person place and time   ASSESSMENT AND PLAN  DIASTOLIC DYSFUNCTION:  There probably is some mild component of diastolic dysfunction. However, I do not think that this is severe. I talked to the patient and his family about salt and fluid restriction. We discussed conservative therapies for treatment of this. I would certainly not increase his diuretics given the orthostatic blood pressure drop.  SYNCOPE:  The patient had syncope probably related to orthostasis. He had documented orthostatic blood pressure dropped transiently today in the office.  He recovers after 3 minutes of standing. We again discussed conservative measures for this. I do not think that further cardiac workup is indicated. I did give him a recommendation for compression stockings.  EDEMA:  This is multifactorial likely and again we discussed conservative management of this. I think he can continue the current dose of diuretic but I would not increase this further.  HTN:  This is controlled on the medications as listed.

## 2012-12-27 ENCOUNTER — Ambulatory Visit: Payer: Medicare Other | Admitting: Pulmonary Disease

## 2013-03-08 ENCOUNTER — Encounter: Payer: Self-pay | Admitting: Pulmonary Disease

## 2013-03-08 ENCOUNTER — Ambulatory Visit (INDEPENDENT_AMBULATORY_CARE_PROVIDER_SITE_OTHER): Payer: Medicare Other | Admitting: Pulmonary Disease

## 2013-03-08 VITALS — BP 124/64 | HR 76 | Temp 97.6°F | Ht 66.0 in | Wt 170.0 lb

## 2013-03-08 DIAGNOSIS — Z23 Encounter for immunization: Secondary | ICD-10-CM

## 2013-03-08 DIAGNOSIS — J439 Emphysema, unspecified: Secondary | ICD-10-CM

## 2013-03-08 DIAGNOSIS — J438 Other emphysema: Secondary | ICD-10-CM

## 2013-03-08 DIAGNOSIS — R911 Solitary pulmonary nodule: Secondary | ICD-10-CM | POA: Insufficient documentation

## 2013-03-08 NOTE — Assessment & Plan Note (Signed)
The patient appears to be doing very well from a COPD standpoint.  I have asked him to stay on his current bronchodilator regimen, and to weigh every day to monitor his fluid balance.  I have also asked him to stay as active as possible to try and improve his endurance.

## 2013-03-08 NOTE — Assessment & Plan Note (Signed)
Needs f/u ct 09/2013.

## 2013-03-08 NOTE — Patient Instructions (Addendum)
Stay on anoro once a day as you are doing. Make sure you weigh everyday, and let someone know if increasing in a short period of time.  Will give you the flu shot today. followup with me again in 4mos.

## 2013-03-08 NOTE — Progress Notes (Signed)
  Subjective:    Patient ID: [name] Plant Sr., male    DOB: 12/14/1924, 79 y.o.   MRN: [id number]  HPI The patient comes in today for followup of his known mild COPD, and also has an element of diastolic dysfunction.  He feels that he has done well since last visit, and has not had any worsening of his breathing.  He has seen cardiology, and is on a stable diuretic dose.  His weight is actually down 1 pound since last visit.  He denies any significant cough or mucus.   Review of Systems  Constitutional: Negative for fever and unexpected weight change.  HENT: Negative for ear pain, nosebleeds, congestion, sore throat, rhinorrhea, sneezing, trouble swallowing, dental problem, postnasal drip and sinus pressure.   Eyes: Negative for redness and itching.  Respiratory: Positive for cough ( x 2 weeks ago--subsided. Wife reports he coughs at night). Negative for chest tightness, shortness of breath and wheezing.   Cardiovascular: Negative for palpitations and leg swelling.  Gastrointestinal: Negative for nausea and vomiting.  Genitourinary: Negative for dysuria.  Musculoskeletal: Negative for joint swelling.  Skin: Negative for rash.  Neurological: Negative for headaches.  Hematological: Does not bruise/bleed easily.  Psychiatric/Behavioral: Negative for dysphoric mood. The patient is not nervous/anxious.        Objective:   Physical Exam Well-developed male in no acute distress Nose without purulence or discharge noted Neck without lymphadenopathy or thyromegaly Chest with mildly decreased breath sounds, no crackles or wheezes Cardiac exam with regular rate and rhythm Lower extremities with mild ankle edema, no cyanosis Alert and oriented, moves all 4 extremities.       Assessment & Plan:

## 2013-07-11 ENCOUNTER — Encounter: Payer: Self-pay | Admitting: Pulmonary Disease

## 2013-07-11 ENCOUNTER — Ambulatory Visit (INDEPENDENT_AMBULATORY_CARE_PROVIDER_SITE_OTHER): Payer: Self-pay | Admitting: Pulmonary Disease

## 2013-07-11 VITALS — BP 138/76 | HR 80 | Temp 97.9°F | Ht 66.0 in | Wt 174.0 lb

## 2013-07-11 DIAGNOSIS — R911 Solitary pulmonary nodule: Secondary | ICD-10-CM

## 2013-07-11 DIAGNOSIS — J439 Emphysema, unspecified: Secondary | ICD-10-CM

## 2013-07-11 DIAGNOSIS — J438 Other emphysema: Secondary | ICD-10-CM

## 2013-07-11 NOTE — Assessment & Plan Note (Signed)
The patient seems to be doing very well from a pulmonary standpoint on his current regimen. I've asked him to continue with this.

## 2013-07-11 NOTE — Progress Notes (Signed)
   Subjective:    Patient ID: [name] Seller Sr., male    DOB: 16-Aug-1924, 80 y.o.   MRN: [id number]  HPI The patient comes in today for followup of his known COPD. He also has diastolic dysfunction, but has been maintaining stable weights without significant fluid retention. He and his family feel that he has done well on anoro, and has not had any flareup since the last visit. He continues to try and be active.   Review of Systems  Constitutional: Negative for fever and unexpected weight change.  HENT: Negative for congestion, dental problem, ear pain, nosebleeds, postnasal drip, rhinorrhea, sinus pressure, sneezing, sore throat and trouble swallowing.   Eyes: Negative for redness and itching.  Respiratory: Negative for cough, chest tightness, shortness of breath and wheezing.   Cardiovascular: Negative for palpitations and leg swelling.  Gastrointestinal: Negative for nausea and vomiting.  Genitourinary: Negative for dysuria.  Musculoskeletal: Negative for joint swelling.  Skin: Negative for rash.  Neurological: Negative for headaches.  Hematological: Does not bruise/bleed easily.  Psychiatric/Behavioral: Negative for dysphoric mood. The patient is not nervous/anxious.        Objective:   Physical Exam Well-developed male in no acute distress Nose without purulence or discharge noted Neck without lymphadenopathy or thyromegaly Chest fairly clear to auscultation with no wheezing Cardiac exam with regular rate and rhythm, 2/6 systolic murmur Lower extremities with no significant edema, no cyanosis Alert and oriented, moves all 4 extremities.       Assessment & Plan:

## 2013-07-11 NOTE — Patient Instructions (Signed)
Stay on anoro everyday. Can use albuterol inhaler for rescue if needed, 2 puffs every 6 hrs  Watch fluid balance everyday with weight check. followup with me again in 4 mos to see how things are going.

## 2013-07-11 NOTE — Assessment & Plan Note (Signed)
The patient is due for a followup CT chest in April of this year.

## 2013-09-27 ENCOUNTER — Other Ambulatory Visit: Payer: Self-pay

## 2013-10-19 ENCOUNTER — Other Ambulatory Visit (INDEPENDENT_AMBULATORY_CARE_PROVIDER_SITE_OTHER): Payer: Medicare Other

## 2013-10-19 ENCOUNTER — Encounter: Payer: Self-pay | Admitting: Pulmonary Disease

## 2013-10-19 ENCOUNTER — Ambulatory Visit (INDEPENDENT_AMBULATORY_CARE_PROVIDER_SITE_OTHER): Payer: Medicare Other | Admitting: Pulmonary Disease

## 2013-10-19 VITALS — BP 114/62 | HR 92 | Temp 97.4°F | Ht 66.0 in | Wt 167.0 lb

## 2013-10-19 DIAGNOSIS — J438 Other emphysema: Secondary | ICD-10-CM

## 2013-10-19 DIAGNOSIS — J439 Emphysema, unspecified: Secondary | ICD-10-CM

## 2013-10-19 DIAGNOSIS — R911 Solitary pulmonary nodule: Secondary | ICD-10-CM

## 2013-10-19 LAB — BASIC METABOLIC PANEL
BUN: 20 mg/dL (ref 6–23)
CALCIUM: 8.5 mg/dL (ref 8.4–10.5)
CHLORIDE: 99 meq/L (ref 96–112)
CO2: 26 meq/L (ref 19–32)
CREATININE: 1.5 mg/dL (ref 0.4–1.5)
GFR: 45.71 mL/min — ABNORMAL LOW (ref >60.00)
Glucose, Bld: 93 mg/dL (ref 70–99)
Potassium: 4.2 mEq/L (ref 3.5–5.1)
Sodium: 134 mEq/L — ABNORMAL LOW (ref 135–145)

## 2013-10-19 LAB — MAGNESIUM: Magnesium: 2 mg/dL (ref 1.5–2.5)

## 2013-10-19 LAB — PHOSPHORUS: PHOSPHORUS: 2.3 mg/dL (ref 2.3–4.6)

## 2013-10-19 NOTE — Progress Notes (Signed)
   Subjective:    Patient ID: [name] Seller Sr., male    DOB: 05-16-1925, 80 y.o.   MRN: [id number]  HPI Patient comes in today for followup of his known COPD. He also has issues with diastolic dysfunction and a tendency toward fluid overload. He feels that his breathing has been adequate, and denies any significant congestion or cough. He is continuing on anoro. He has been weighing daily, and taking Lasix one to 2 every day. He is complaining of fatigue and a feeling of note energy, and his weight is down 7 pounds since the last visit. He has not had recent blood work.   Review of Systems  Constitutional: Positive for fatigue. Negative for fever and unexpected weight change.  HENT: Negative for congestion, dental problem, ear pain, nosebleeds, postnasal drip, rhinorrhea, sinus pressure, sneezing, sore throat and trouble swallowing.   Eyes: Negative for redness and itching.  Respiratory: Positive for cough and shortness of breath. Negative for chest tightness and wheezing.   Cardiovascular: Negative for palpitations and leg swelling.  Gastrointestinal: Negative for nausea and vomiting.  Genitourinary: Negative for dysuria.  Musculoskeletal: Negative for joint swelling.  Skin: Negative for rash.  Neurological: Negative for headaches.  Hematological: Does not bruise/bleed easily.  Psychiatric/Behavioral: Negative for dysphoric mood. The patient is not nervous/anxious.        Objective:   Physical Exam Well-developed male in no acute distress Nose without purulence or discharge noted Neck without lymphadenopathy or thyromegaly Chest totally clear to auscultation, no wheezes or crackles Cardiac exam with regular rate and rhythm Lower extremities with no significant edema, no cyanosis Alert and oriented, moves all 4 extremities.       Assessment & Plan:

## 2013-10-19 NOTE — Patient Instructions (Addendum)
Will check bloodwork today to make sure kidney function and electrolytes are ok.  No change in breathing medication.  followup with me in 58mos, and will get scan of chest moved to that time as well.

## 2013-10-19 NOTE — Assessment & Plan Note (Signed)
The patient appears to be doing fairly well from a COPD standpoint, but he is having decreased energy that is impacting his quality of life. His weight is down 7 pounds since the last visit, and I wonder if we have over shot on his diuretic therapy. I would like to check a be met and electrolytes today to make sure these are not the issue. I've also discussed with him how to take his Lasix based on his weight. He is to continue on his current regimen.

## 2013-11-10 ENCOUNTER — Ambulatory Visit: Payer: Self-pay | Admitting: Pulmonary Disease

## 2013-11-10 ENCOUNTER — Other Ambulatory Visit: Payer: Self-pay

## 2013-12-12 ENCOUNTER — Encounter: Payer: Self-pay | Admitting: Gastroenterology

## 2013-12-12 ENCOUNTER — Encounter: Payer: Self-pay | Admitting: Physician Assistant

## 2013-12-25 ENCOUNTER — Ambulatory Visit (INDEPENDENT_AMBULATORY_CARE_PROVIDER_SITE_OTHER): Payer: Medicare Other | Admitting: Physician Assistant

## 2013-12-25 ENCOUNTER — Encounter: Payer: Self-pay | Admitting: Physician Assistant

## 2013-12-25 ENCOUNTER — Other Ambulatory Visit (INDEPENDENT_AMBULATORY_CARE_PROVIDER_SITE_OTHER): Payer: Medicare Other

## 2013-12-25 VITALS — BP 170/70 | HR 84 | Ht 66.0 in | Wt 163.8 lb

## 2013-12-25 DIAGNOSIS — D649 Anemia, unspecified: Secondary | ICD-10-CM

## 2013-12-25 LAB — CBC WITH DIFFERENTIAL/PLATELET
BASOS ABS: 0 10*3/uL (ref 0.0–0.1)
Basophils Relative: 0.5 % (ref 0.0–3.0)
EOS ABS: 0.1 10*3/uL (ref 0.0–0.7)
Eosinophils Relative: 0.8 % (ref 0.0–5.0)
LYMPHS ABS: 1.6 10*3/uL (ref 0.7–4.0)
Lymphocytes Relative: 23.7 % (ref 12.0–46.0)
MCHC: 31.6 g/dL (ref 30.0–36.0)
MCV: 79.4 fl (ref 78.0–100.0)
MONO ABS: 0.5 10*3/uL (ref 0.1–1.0)
Monocytes Relative: 8 % (ref 3.0–12.0)
NEUTROS ABS: 4.6 10*3/uL (ref 1.4–7.7)
Neutrophils Relative %: 67 % (ref 43.0–77.0)
PLATELETS: 216 10*3/uL (ref 150.0–400.0)
RBC: 3.46 Mil/uL — ABNORMAL LOW (ref 4.22–5.81)
RDW: 18.5 % — AB (ref 11.5–15.5)
WBC: 6.9 10*3/uL (ref 4.0–10.5)

## 2013-12-25 NOTE — Patient Instructions (Addendum)
Please go to the basement level to have your labs drawn.  We will notify you with the lab results.

## 2013-12-25 NOTE — Progress Notes (Signed)
Subjective:    Patient ID: [name] Seller Sr., male    DOB: 07-Feb-1925, 80 y.o.   MRN: [id number]  HPI  [name] [name] is a pleasant 80 year old white male referred today by [facility] internal medicine. His  daughter works in the pulmonary department with with [employer]. Patient is new to GI and has not had any prior GI evaluation. He has history of COPD chronic cough diastolic dysfunction and a solitary pulmonary nodule which is being followed by Dr.Clance Patient has been found to have a new normocytic anemia. Review of his labs shows hemoglobin in December of 2014 was 10.2 hematocrit of 31.2 in May 2015 hemoglobin was 8 with hematocrit of 25.2 MCV of 78 platelets 271 and WBC of 5.8 iron studies were done in June 2015 showing a serum iron of 261 iron saturation of 17 TIBC of 337 and CBC repeated WBC of 6.9 hemoglobin 8.9 hematocrit of 28.1 MCV of 79 and platelets of 234 B12 level normal at 586 and again Hemoccults were negative x3 Patient has complained of fatigue over the past 3-4 months which has been persistent. He says occasionally he'll feels a little lightheaded or woozy. He is more short of breath with exertion than usual, denies any chest pain. He has no complaints of abdominal pain, changes in his bowel habits, melena or hematochezia. His appetite has been fine and his weight is stable. Family history is negative for colon cancer, polyps He is planned for followup CT of his chest and September per his daughter. They have an appointment to see a hematologist in [facility] tomorrow..    Review of Systems  Constitutional: Positive for fatigue.  HENT: Negative.   Eyes: Negative.   Respiratory: Positive for cough.   Cardiovascular: Negative.   Gastrointestinal: Negative.   Endocrine: Negative.   Genitourinary: Negative.   Skin: Negative.   Allergic/Immunologic: Negative.   Neurological: Negative.   Hematological: Negative.   Psychiatric/Behavioral: Negative.    Outpatient  Prescriptions Prior to Visit  Medication Sig Dispense Refill  . diltiazem (TIAZAC) 120 MG 24 hr capsule Take 120 mg by mouth daily.      . finasteride (PROSCAR) 5 MG tablet Take 5 mg by mouth daily.      . furosemide (LASIX) 20 MG tablet Take 20 mg by mouth as needed. Take 1 tablet daily, can increase to 2 tabs a day for increased swelling.      Marland Kitchen ipratropium-albuterol (DUONEB) 0.5-2.5 (3) MG/3ML SOLN Take 3 mLs by nebulization every 6 (six) hours as needed (for shortness of breath).      Marland Kitchen omeprazole (PRILOSEC) 40 MG capsule Take 40 mg by mouth daily. Before breakfast and bedtime      . tamsulosin (FLOMAX) 0.4 MG CAPS Take 0.4 mg by mouth daily.      Marland Kitchen Umeclidinium-Vilanterol (ANORO ELLIPTA) 62.5-25 MCG/INH AEPB Inhale 1 puff into the lungs daily.  30 each  12  . vitamin B-12 (CYANOCOBALAMIN) 1000 MCG tablet Take 1,000 mcg by mouth daily.      Marland Kitchen levothyroxine (SYNTHROID, LEVOTHROID) 100 MCG tablet Take 100 mcg by mouth daily before breakfast.       No facility-administered medications prior to visit.   Allergies  Allergen Reactions  . Codeine Nausea And Vomiting   Patient Active Problem List   Diagnosis Date Noted  . Normocytic normochromic anemia 12/25/2013  . Solitary pulmonary nodule 03/08/2013  . Diastolic dysfunction 60/73/7106  . Chronic cough 08/26/2012  . COPD (chronic obstructive pulmonary disease) with emphysema 08/12/2012  .  Chronic respiratory failure 08/12/2012   History  Substance Use Topics  . Smoking status: Former Smoker -- 1.50 packs/day for 25 years    Types: Cigarettes    Quit date: 06/16/1971  . Smokeless tobacco: Never Used     Comment: started at 80 years old  . Alcohol Use: No   family history includes COPD in his sister; Heart disease in his sister; Lung cancer in his sister; Lung disease in his mother; Other in his father. There is no history of Colon cancer.     Objective:   Physical Exam  well-developed elderly white male in no acute distress,  pleasant accompanied by his wife and daughter blood pressure 170/70 pulse 84 height 5 foot 6 weight 163. HEENT; nontraumatic normocephalic EOMI PERRLA sclera anicteric, Supple; no JVD, Cardiovascular; regular rate and rhythm with P8-K9 soft systolic murmur, Pulmonary ;clear bilaterally, Abdomen; soft nontender nondistended bowel sounds are active there is no palpable mass or hepatosplenomegaly bowel sounds are present, Rectal; exam not done, he recently completed Hemoccult cards which were negative x3, Extremities; no clubbing cyanosis or edema skin warm dry, Psych; mood and affect appropriate        Assessment & Plan:  #61 80 year old male with new normocytic anemia of unclear etiology she is not iron deficient and stools are Hemoccult-negative and therefore feel less likely that this is of GI origin. I suspect this is a primary hematologic problem with a myelodysplastic syndrome. He is asymptomatic from a GI standpoint and do to his advanced age would like to avoid endoscopic evaluation of possible. #2 COPD with chronic respiratory failure #3 diastolic dysfunction #4 solitary pulmonary nodule due for followup CT September 2015  Plan; Discussed options in detail with the patient and family, would prefer he undergo hematologic evaluation first and if the hematologist feels his GI endoscopic evaluation is necessary we can schedule at that time accordingly. He will be established with Dr. Oretha Caprice if endoscopic workup is requested. Repeat CBC today Await hematology evaluation

## 2013-12-26 NOTE — Progress Notes (Signed)
i agree with the note above

## 2013-12-27 ENCOUNTER — Telehealth: Payer: Self-pay | Admitting: Physician Assistant

## 2013-12-27 NOTE — Telephone Encounter (Signed)
I spoke with the patient's wife they did have him seen with a hematologist in [place].  All questions answered

## 2014-02-20 ENCOUNTER — Ambulatory Visit (INDEPENDENT_AMBULATORY_CARE_PROVIDER_SITE_OTHER): Payer: Medicare Other | Admitting: Pulmonary Disease

## 2014-02-20 ENCOUNTER — Encounter: Payer: Self-pay | Admitting: Pulmonary Disease

## 2014-02-20 ENCOUNTER — Ambulatory Visit (INDEPENDENT_AMBULATORY_CARE_PROVIDER_SITE_OTHER)
Admission: RE | Admit: 2014-02-20 | Discharge: 2014-02-20 | Disposition: A | Discharge status: Home / Self Care | Payer: Medicare Other | Source: Ambulatory Visit | Attending: Pulmonary Disease | Admitting: Pulmonary Disease

## 2014-02-20 VITALS — BP 120/68 | HR 71 | Temp 97.7°F | Ht 66.0 in | Wt 160.8 lb

## 2014-02-20 DIAGNOSIS — J439 Emphysema, unspecified: Secondary | ICD-10-CM

## 2014-02-20 DIAGNOSIS — J438 Other emphysema: Secondary | ICD-10-CM

## 2014-02-20 DIAGNOSIS — R911 Solitary pulmonary nodule: Secondary | ICD-10-CM

## 2014-02-20 NOTE — Assessment & Plan Note (Signed)
The patient's followup CT showed no change in his left upper lobe nodule, and the prior groundglass opacity has resolved. Given his age and frailty, will do no further CT evaluation, even though he would normally require a followup at 7 months to meet the 24 month followup criteria.  The patient and his family are agreeable to this approach

## 2014-02-20 NOTE — Patient Instructions (Signed)
Your scan of your chest is better.  No further followup is needed. Stay on anoro and albuterol as needed. I suspect your weakness will improve once your blood count is better. followup with me in 46mos.

## 2014-02-20 NOTE — Assessment & Plan Note (Signed)
The patient continues to feel that anoro has helped his breathing, although he did have what sounds like an exacerbation about 3 weeks ago. He received antibiotics and steroids from his primary physician, and feels that he is now back to baseline. He still has a lot of weakness, and the family has brought recent blood work which showed a hemoglobin of 8.3. I suspect this is contributing significantly. He is following with hematology.

## 2014-02-20 NOTE — Progress Notes (Signed)
   Subjective:    Patient ID: [name] Seller Sr., male    DOB: December 01, 1924, 80 y.o.   MRN: [id number]  HPI The patient comes in today for followup of his known COPD. He also has a pulmonary nodule with a recent followup scan that showed resolution of his groundglass opacity and no change in his pulmonary nodule. The patient feels that he is doing well currently, but did have what sounds like an acute exacerbation exudative 3 weeks ago.  He responded to a course of antibiotics and a steroid shot. He currently is without chest congestion, cough with mucus, or purulence. He continues to be weak, but he remains anemic with a hemoglobin of 8.3.  He apparently is being followed by hematology.   Review of Systems  Constitutional: Negative for fever and unexpected weight change.  HENT: Negative for congestion, dental problem, ear pain, nosebleeds, postnasal drip, rhinorrhea, sinus pressure, sneezing, sore throat and trouble swallowing.   Eyes: Negative for redness and itching.  Respiratory: Positive for shortness of breath. Negative for cough, chest tightness and wheezing.   Cardiovascular: Negative for palpitations and leg swelling.  Gastrointestinal: Negative for nausea and vomiting.  Genitourinary: Negative for dysuria.  Musculoskeletal: Negative for joint swelling.  Skin: Negative for rash.  Neurological: Negative for headaches.  Hematological: Does not bruise/bleed easily.  Psychiatric/Behavioral: Negative for dysphoric mood. The patient is not nervous/anxious.        Objective:   Physical Exam Well-developed male in no acute distress Nose without purulence or discharge noted Neck without lymphadenopathy or thyromegaly Chest with fairly clear breath sounds, no wheezing or crackles Cardiac exam with distant heart sounds but regular Lower extremities with minimal ankle edema, no cyanosis Alert and oriented, moves all 4 extremities.       Assessment & Plan:

## 2014-06-19 ENCOUNTER — Ambulatory Visit: Payer: Medicare Other | Admitting: Pulmonary Disease

## 2014-06-27 ENCOUNTER — Ambulatory Visit: Payer: Self-pay | Admitting: Pulmonary Disease

## 2014-07-17 ENCOUNTER — Ambulatory Visit (INDEPENDENT_AMBULATORY_CARE_PROVIDER_SITE_OTHER): Payer: Medicare Other | Admitting: Pulmonary Disease

## 2014-07-17 ENCOUNTER — Encounter: Payer: Self-pay | Admitting: Pulmonary Disease

## 2014-07-17 VITALS — BP 162/66 | HR 76 | Temp 97.0°F | Ht 66.0 in | Wt 170.0 lb

## 2014-07-17 DIAGNOSIS — R053 Chronic cough: Secondary | ICD-10-CM

## 2014-07-17 DIAGNOSIS — J438 Other emphysema: Secondary | ICD-10-CM

## 2014-07-17 DIAGNOSIS — R911 Solitary pulmonary nodule: Secondary | ICD-10-CM

## 2014-07-17 DIAGNOSIS — R05 Cough: Secondary | ICD-10-CM

## 2014-07-17 NOTE — Assessment & Plan Note (Signed)
The patient has decided to not pursue this with CT surveillance.

## 2014-07-17 NOTE — Assessment & Plan Note (Signed)
He continues to have a mild persistent cough, but is having a lot of rhinorrhea, hoarseness, and is not taking an antihistamine on a regular basis. I've asked him to get on Zyrtec, and he will let us know if this does not help his cough.

## 2014-07-17 NOTE — Assessment & Plan Note (Signed)
He continues to feel that anoro has helped his breathing, and I have asked him to continue on this medication. I've also stressed to him the importance of modest weight loss and staying as active as possible.

## 2014-07-17 NOTE — Patient Instructions (Signed)
Continue on anoro Try zyrtec 10mg  at bedtime on a consistent basis to see if helps with nasal symptoms and cough.  If not, let us know.  Stay as active as possible, work on modest weight loss.  followup with me again in 32mos.

## 2014-07-17 NOTE — Progress Notes (Signed)
   Subjective:    Patient ID: [name] Seller Sr., male    DOB: Nov 25, 1924, 81 y.o.   MRN: [id number]  HPI The patient comes in today for follow-up of his known COPD. He is staying on anoro, and has seen his breathing continued to do well on this medication. He has had no significant chest congestion or purulence, but does have a mild ongoing cough probably related to postnasal drip. He has had rhinorrhea and hoarseness. He also has a history of a pulmonary nodule, but has decided not to do surveillance by CT   Review of Systems  Constitutional: Negative for fever and unexpected weight change.  HENT: Positive for congestion and postnasal drip. Negative for dental problem, ear pain, nosebleeds, rhinorrhea, sinus pressure, sneezing, sore throat and trouble swallowing.   Eyes: Negative for redness and itching.  Respiratory: Positive for cough and shortness of breath. Negative for chest tightness and wheezing.   Cardiovascular: Negative for palpitations and leg swelling.  Gastrointestinal: Negative for nausea and vomiting.  Genitourinary: Negative for dysuria.  Musculoskeletal: Negative for joint swelling.  Skin: Negative for rash.  Neurological: Negative for headaches.  Hematological: Does not bruise/bleed easily.  Psychiatric/Behavioral: Negative for dysphoric mood. The patient is not nervous/anxious.        Objective:   Physical Exam Well-developed male in no acute distress Nose without purulence or discharge noted Neck without lymphadenopathy or thyromegaly Chest clear to auscultation, no wheezing Cardiac exam with regular rate and rhythm Lower extremities without edema, no cyanosis Alert and oriented, moves all 4 extremities.       Assessment & Plan:

## 2014-09-19 DIAGNOSIS — D696 Thrombocytopenia, unspecified: Secondary | ICD-10-CM | POA: Insufficient documentation

## 2014-11-26 DIAGNOSIS — M5136 Other intervertebral disc degeneration, lumbar region: Secondary | ICD-10-CM | POA: Insufficient documentation

## 2015-01-21 DIAGNOSIS — G8929 Other chronic pain: Secondary | ICD-10-CM | POA: Insufficient documentation

## 2015-01-21 DIAGNOSIS — M549 Dorsalgia, unspecified: Secondary | ICD-10-CM

## 2015-01-21 DIAGNOSIS — M47816 Spondylosis without myelopathy or radiculopathy, lumbar region: Secondary | ICD-10-CM | POA: Insufficient documentation

## 2015-03-11 ENCOUNTER — Ambulatory Visit: Payer: Medicare Other | Admitting: Adult Health

## 2015-03-15 ENCOUNTER — Encounter: Payer: Self-pay | Admitting: Adult Health

## 2015-03-15 ENCOUNTER — Ambulatory Visit (INDEPENDENT_AMBULATORY_CARE_PROVIDER_SITE_OTHER)
Admission: RE | Admit: 2015-03-15 | Discharge: 2015-03-15 | Disposition: A | Discharge status: Home / Self Care | Payer: Medicare Other | Source: Ambulatory Visit | Attending: Adult Health | Admitting: Adult Health

## 2015-03-15 ENCOUNTER — Ambulatory Visit (INDEPENDENT_AMBULATORY_CARE_PROVIDER_SITE_OTHER): Payer: Medicare Other | Admitting: Adult Health

## 2015-03-15 VITALS — BP 150/68 | HR 67 | Temp 97.5°F | Ht 66.0 in | Wt 154.6 lb

## 2015-03-15 DIAGNOSIS — R911 Solitary pulmonary nodule: Secondary | ICD-10-CM | POA: Diagnosis not present

## 2015-03-15 DIAGNOSIS — J439 Emphysema, unspecified: Secondary | ICD-10-CM | POA: Diagnosis not present

## 2015-03-15 DIAGNOSIS — R053 Chronic cough: Secondary | ICD-10-CM

## 2015-03-15 DIAGNOSIS — R05 Cough: Secondary | ICD-10-CM

## 2015-03-15 MED ORDER — PREDNISONE 10 MG PO TABS: ORAL_TABLET | ORAL | Status: DC

## 2015-03-15 MED ORDER — LEVOFLOXACIN 500 MG PO TABS: 500.0000 mg | ORAL_TABLET | Freq: Every day | ORAL | Status: DC

## 2015-03-15 NOTE — Patient Instructions (Addendum)
Your blood pressure is elevated , see your Primary doctor for your blood pressure.  Augmentin 875mg  Twice daily  For 1 week.  Mucinex DM Twice daily  .As needed  Cough/congestion  Prednisone taper over next week.  Fluids and rest  CT chest is being set up .  Please contact office for sooner follow up if symptoms do not improve or worsen or seek emergency care  Follow up Dr. Lake Bells in 6 weeks and As needed

## 2015-03-15 NOTE — Progress Notes (Signed)
   Subjective:    Patient ID: [name] [name] Sr., male    DOB: 1924-08-24, 81 y.o.   MRN: [id number]  HPI 81 yo male with COPD and lung nodule . Previous patient of Dr. Rolla Etienne of [facility].   Tests CXR/CT chest 2014:  Emphysematous changes with mild scarring, +dilated esophagus with A/F level.  PFT"s 08/2012:  Normal FEV1%, but FVC improved 17% with BD.  +++ airtrapping/ +FVL, no restriction, DLCO normal  CT chest 02/2014> Stable 9 mm left upper lobe nodular density.   03/15/2015 acute office visit : COPD , lung nodule Patient presents for an acute office visit. He complains over the last week that his breathing has not been as good. He complains of increased cough with thick clear mucus, shortness of breath and weakness. Appetite is also been low and he's had some weight loss. He denies any hemoptysis, chest pain, orthopnea, PND, or increased leg swelling. He remains on BREO . Daily  Patient has a known 9 mm left upper lobe lung nodule. He is a former smoker. We discussed follow-up CT to monitor this nodule.    Review of Systems Constitutional:   No  weight loss, night sweats,  Fevers, chills, f +fatigue, or  lassitude.  HEENT:   No headaches,  Difficulty swallowing,  Tooth/dental problems, or  Sore throat,                No sneezing, itching, ear ache, nasal congestion, post nasal drip,   CV:  No chest pain,  Orthopnea, PND, swelling in lower extremities, anasarca, dizziness, palpitations, syncope.   GI  No heartburn, indigestion, abdominal pain, nausea, vomiting, diarrhea, change in bowel habits, loss of appetite, bloody stools.   Resp:   No chest wall deformity  Skin: no rash or lesions.  GU: no dysuria, change in color of urine, no urgency or frequency.  No flank pain, no hematuria   MS:  No joint pain or swelling.  No decreased range of motion.  No back pain.  Psych:  No change in mood or affect. No depression or anxiety.  No memory  loss.         Objective:   Physical Exam GEN: A/Ox3; pleasant , NAD, frail and elderly  HEENT:  [place]/AT,  EACs-clear, TMs-wnl, NOSE-clear, THROAT-clear, no lesions, no postnasal drip or exudate noted.   NECK:  Supple w/ fair ROM; no JVD; normal carotid impulses w/o bruits; no thyromegaly or nodules palpated; no lymphadenopathy.  RESP  few scattered rhonchi and expiratory wheezes no accessory muscle use, no dullness to percussion  CARD:  RRR, no m/r/g  , trace peripheral edema, pulses intact, no cyanosis or clubbing.  GI:   Soft & nt; nml bowel sounds; no organomegaly or masses detected.  Musco: Warm bil, no deformities or joint swelling noted.   Neuro: alert, no focal deficits noted.    Skin: Warm, no lesions or rashes   02/2014 CT chest  No active cardiopulmonary abnormalities. 2. Resolution of previous ground-glass abnormality. 3. Stable 9 mm left upper lobe nodular density. Today's study documents stability for approximately 17 months. Followup imaging on 10/08/14 is recommended to establish 2 years of stability and confirm benignity.      Assessment & Plan:

## 2015-03-17 NOTE — Assessment & Plan Note (Signed)
Exacerbation  CXR  Is down today , CT has an opening will go ahead to get CT to follow nodule and look at to see if acute process   Plan  Augmentin 875mg  Twice daily  For 1 week.  Mucinex DM Twice daily  .As needed  Cough/congestion  Prednisone taper over next week.  Fluids and rest  CT chest is being set up .  Please contact office for sooner follow up if symptoms do not improve or worsen or seek emergency care  Follow up Dr. Lake Bells in 6 weeks and As needed      Late add :  PNA on CT  Pt is to continue on augmentin with follow up in 2 weeks with cxr  If not improving will need sooner follow up

## 2015-03-17 NOTE — Assessment & Plan Note (Signed)
Follow up CT chest today

## 2015-03-25 NOTE — Progress Notes (Signed)
I agree with this plan of care 

## 2015-03-29 ENCOUNTER — Ambulatory Visit (INDEPENDENT_AMBULATORY_CARE_PROVIDER_SITE_OTHER): Payer: Medicare Other | Admitting: Adult Health

## 2015-03-29 ENCOUNTER — Ambulatory Visit (INDEPENDENT_AMBULATORY_CARE_PROVIDER_SITE_OTHER)
Admission: RE | Admit: 2015-03-29 | Discharge: 2015-03-29 | Disposition: A | Discharge status: Home / Self Care | Payer: Medicare Other | Source: Ambulatory Visit | Attending: Adult Health | Admitting: Adult Health

## 2015-03-29 ENCOUNTER — Encounter: Payer: Self-pay | Admitting: Adult Health

## 2015-03-29 VITALS — BP 124/82 | HR 73 | Temp 98.0°F | Ht 66.0 in | Wt 156.4 lb

## 2015-03-29 DIAGNOSIS — R911 Solitary pulmonary nodule: Secondary | ICD-10-CM

## 2015-03-29 DIAGNOSIS — J188 Other pneumonia, unspecified organism: Secondary | ICD-10-CM

## 2015-03-29 DIAGNOSIS — B029 Zoster without complications: Secondary | ICD-10-CM

## 2015-03-29 DIAGNOSIS — J439 Emphysema, unspecified: Secondary | ICD-10-CM

## 2015-03-29 MED ORDER — VALACYCLOVIR HCL 1 G PO TABS
1000.0000 mg | ORAL_TABLET | Freq: Three times a day (TID) | ORAL | Status: AC
Start: 2015-03-29 — End: 2015-03-30

## 2015-03-29 MED ORDER — ACYCLOVIR 400 MG PO TABS: 400.0000 mg | ORAL_TABLET | Freq: Every day | ORAL | Status: DC

## 2015-03-29 MED ORDER — ACYCLOVIR 400 MG PO TABS
400.0000 mg | ORAL_TABLET | Freq: Every day | ORAL | Status: DC
Start: 2015-03-29 — End: 2015-03-29

## 2015-03-29 NOTE — Patient Instructions (Addendum)
Mucinex DM Twice daily  .As needed  Cough/congestion  Begin Acyclovir five times daily for 1 week  Need to see eye doctor as soon as possible..  Please contact office for sooner follow up if symptoms do not improve or worsen or seek emergency care  Follow up Dr. Lake Bells in 6 weeks and As needed

## 2015-04-01 DIAGNOSIS — B029 Zoster without complications: Secondary | ICD-10-CM | POA: Insufficient documentation

## 2015-04-01 NOTE — Assessment & Plan Note (Signed)
Recent flare now resoling   Plan  Mucinex DM Twice daily  .As needed  Cough/congestion  Please contact office for sooner follow up if symptoms do not improve or worsen or seek emergency care  Follow up Dr. Lake Bells in 6 weeks and As needed

## 2015-04-01 NOTE — Progress Notes (Signed)
Subjective:    Patient ID: [name] Seller Sr., male    DOB: 1925/05/08, 81 y.o.   MRN: [id number]  HPI 81 yo male with COPD and lung nodule . Previous patient of Dr. Rolla Etienne of [facility], [facility].   Tests CXR/CT chest 2014:  Emphysematous changes with mild scarring, +dilated esophagus with A/F level.  PFT"s 08/2012:  Normal FEV1%, but FVC improved 17% with BD.  +++ airtrapping/ +FVL, no restriction, DLCO normal  CT chest 02/2014> Stable 9 mm left upper lobe nodular density.   03/29/15  Follow up :  : COPD , lung nodule, PNA  Pt returns for follow up .  Seen 2 weeks ago for COPD flare . He was due for CT chest to follow lung nodule in LUL .  CT chest showed resolved lung nodule but RLL aspdz c/w PNA .  He was tx w/ augmentin x 7 days  He is feeling better.  Over last week with watery eyes and redness on right . Also rash along scalp that itches.   No fever , chest pain, orthopnea, edema or fever.    Review of Systems Constitutional:   No  weight loss, night sweats,  Fevers, chills, f +fatigue, or  lassitude.  HEENT:   No headaches,  Difficulty swallowing,  Tooth/dental problems, or  Sore throat,                No sneezing, itching, ear ache, nasal congestion, post nasal drip,   CV:  No chest pain,  Orthopnea, PND, swelling in lower extremities, anasarca, dizziness, palpitations, syncope.   GI  No heartburn, indigestion, abdominal pain, nausea, vomiting, diarrhea, change in bowel habits, loss of appetite, bloody stools.   Resp:   No chest wall deformity  Skin: no rash or lesions.  GU: no dysuria, change in color of urine, no urgency or frequency.  No flank pain, no hematuria   MS:  No joint pain or swelling.  No decreased range of motion.  No back pain.  Psych:  No change in mood or affect. No depression or anxiety.  No memory loss.         Objective:   Physical Exam GEN: A/Ox3; pleasant , NAD, frail and elderly  HEENT:  [place]/AT,  EACs-clear,  TMs-wnl, NOSE-clear, THROAT-clear, no lesions, no postnasal drip or exudate noted. Injected conjunctiva , upper lid w/ ulcer noted.   NECK:  Supple w/ fair ROM; no JVD; normal carotid impulses w/o bruits; no thyromegaly or nodules palpated; no lymphadenopathy.  RESP Decreased BS in bases  no accessory muscle use, no dullness to percussion  CARD:  RRR, no m/r/g  , trace peripheral edema, pulses intact, no cyanosis or clubbing.  GI:   Soft & nt; nml bowel sounds; no organomegaly or masses detected.  Musco: Warm bil, no deformities or joint swelling noted.   Neuro: alert, no focal deficits noted.    Skin: Warm, crusted lesions along right forehead and scalp/hair on right    02/2014 CT chest  No active cardiopulmonary abnormalities. 2. Resolution of previous ground-glass abnormality. 3. Stable 9 mm left upper lobe nodular density. Today's study documents stability for approximately 17 months. Followup imaging on 10/08/14 is recommended to establish 2 years of stability and confirm benignity.   03/15/15 CT chest  No suspicious pulmonary nodules. Previously demonstrated branching structure in the left upper lobe appears thinner, consistent with a benign etiology, possibly a mucous impacted bronchus. 2. Chronic bibasilar atelectasis with increased  airspace disease posteriorly in the right lower lobe. This could reflect superimposed pneumonia given the reported productive cough  CXR 03/29/15  Chronic changes noted reviewd personally      Assessment & Plan:

## 2015-04-01 NOTE — Assessment & Plan Note (Signed)
Resolved on CT chest no further testing needed.

## 2015-04-01 NOTE — Assessment & Plan Note (Signed)
Facial shingles w/ possible eye involvement -atypical case w/ no sign pain .  ? HSV   Plan   Begin Acyclovir x 1 week  Refer to eye doctor for evaluation , has ov last today.

## 2015-05-17 ENCOUNTER — Ambulatory Visit (INDEPENDENT_AMBULATORY_CARE_PROVIDER_SITE_OTHER)
Admission: RE | Admit: 2015-05-17 | Discharge: 2015-05-17 | Disposition: A | Discharge status: Home / Self Care | Payer: Medicare Other | Source: Ambulatory Visit | Attending: Pulmonary Disease | Admitting: Pulmonary Disease

## 2015-05-17 ENCOUNTER — Ambulatory Visit (INDEPENDENT_AMBULATORY_CARE_PROVIDER_SITE_OTHER): Payer: Medicare Other | Admitting: Pulmonary Disease

## 2015-05-17 ENCOUNTER — Encounter: Payer: Self-pay | Admitting: Pulmonary Disease

## 2015-05-17 VITALS — BP 132/66 | HR 68 | Ht 66.0 in | Wt 161.0 lb

## 2015-05-17 DIAGNOSIS — J439 Emphysema, unspecified: Secondary | ICD-10-CM | POA: Diagnosis not present

## 2015-05-17 DIAGNOSIS — R05 Cough: Secondary | ICD-10-CM

## 2015-05-17 DIAGNOSIS — R053 Chronic cough: Secondary | ICD-10-CM

## 2015-05-17 DIAGNOSIS — I5189 Other ill-defined heart diseases: Secondary | ICD-10-CM

## 2015-05-17 DIAGNOSIS — I519 Heart disease, unspecified: Secondary | ICD-10-CM

## 2015-05-17 MED ORDER — DOXYCYCLINE HYCLATE 100 MG PO CAPS: 100.0000 mg | ORAL_CAPSULE | Freq: Two times a day (BID) | ORAL | Status: DC

## 2015-05-17 MED ORDER — PREDNISONE 10 MG PO TABS: ORAL_TABLET | ORAL | Status: DC

## 2015-05-17 NOTE — Patient Instructions (Signed)
Take the prednisone taper as prescribed Take the doxycycline twice a day for 7 days Take Lasix every day until your weight is 155 pounds, after that use it as needed in order to keep your weight at 155 pounds Weight yourself every day I will see you back in 2 months or sooner if needed We will call you with the results of the chest x-ray

## 2015-05-17 NOTE — Assessment & Plan Note (Signed)
In addition to an exacerbation of his COPD he does have increased leg swelling on exam today and his weight is up by about 6 pounds from baseline.  Plan: Take Lasix every day Check a chest x-ray to ensure there is no evidence of pulmonary edema Goal weight is 155 pounds, I reiterated the importance of taking his weight every day

## 2015-05-17 NOTE — Progress Notes (Signed)
Subjective:    Patient ID: [name] Seller Sr., male    DOB: [birth year], 81 y.o.   MRN: [id number]  Synopsis: former patient of Dr. Gwenette Greet with COPD Smoked cigarettes for many years, from age 43 to age 89 1.5 ppd CXR/CT chest 2014:  Emphysematous changes with mild scarring, +dilated esophagus with A/F level.  PFT"s 08/2012:  Normal FEV1%, but FVC improved 17% with BD.  +++ airtrapping/ +FVL, no restriction, DLCO normal  Echo 09/2012:  Normal LV EF, diastolic dysfxn, mildly dilated LA, RV normal.  CT angio 09/2012:  No PE, small nodule posterior LUL, GG anterior lingula ?significance, scattered areas of atx.  No significant emphysematous changes or ISLD  HPI Chief Complaint  Patient presents with  . Follow-up    former [facility] pt being treated for emphysema.  pt c/o DOE, prod cough with clear mucus worse at night.     Evlyn Clines says that he has ben coughing more for the last month.  He has been worse when he lay flat, so he has been sleeping in a recliner.  He is not necessarily more short of breath than usual, maybe a little. He tries to walk every day, maybe 1/4 of a mile daily.  He has not been doing that as much lately. Cold weather makes this worse.  He has been walking on a track in a local government building when he can.  This too is limited by the cough.    Past Medical History  Diagnosis Date  . HTN (hypertension)   . COPD (chronic obstructive pulmonary disease) (HCC)     Mild  . Anemia   . Hypothyroidism       Review of Systems  Constitutional: Positive for fatigue. Negative for fever and chills.  HENT: Negative for postnasal drip, rhinorrhea and sinus pressure.   Respiratory: Positive for cough, shortness of breath and wheezing.   Cardiovascular: Positive for leg swelling. Negative for chest pain and palpitations.  Neurological: Positive for speech difficulty.       Objective:   Physical Exam  Filed Vitals:   05/17/15 1408  BP: 132/66  Pulse: 68  Height: 5\' 6"   (1.676 m)  Weight: 161 lb (73.029 kg)  SpO2: 93%   RA  Gen: well appearing HENT: OP clear, TM's clear, neck supple PULM: Wheezing on exam, normal effort CV: RRR, no mgr, significant leg edema, increased JVD GI: BS+, soft, nontender Derm: no cyanosis or rash Psyche: normal mood and affect  CT chest from 03/15/2015 showing resolution of nodules but questionable pneumonia in the left lower lobe Chest x-ray October 2016 showing right lower lobe atelectasis and some emphysema Records from her nurse practitioner earlier this year reviewed where she treated him for an exacerbation of COPD     Assessment & Plan:  COPD (chronic obstructive pulmonary disease) with emphysema ([place]) Unfortunately he is having a flare of his COPD right now as he is a wheezing more on exam.  Plan: Chest x-ray Prednisone taper Doxycycline Continue Anoro  Diastolic dysfunction In addition to an exacerbation of his COPD he does have increased leg swelling on exam today and his weight is up by about 6 pounds from baseline.  Plan: Take Lasix every day Check a chest x-ray to ensure there is no evidence of pulmonary edema Goal weight is 155 pounds, I reiterated the importance of taking his weight every day     Current outpatient prescriptions:  .  Calcium Carbonate-Vitamin D (CALCIUM-VITAMIN D) 500-200 MG-UNIT tablet, Take  1 tablet by mouth daily. , Disp: , Rfl:  .  diltiazem (TIAZAC) 120 MG 24 hr capsule, Take 120 mg by mouth daily., Disp: , Rfl:  .  Ferrous Sulfate (IRON) 28 MG TABS, Take 1 tablet by mouth daily., Disp: , Rfl:  .  finasteride (PROSCAR) 5 MG tablet, Take 5 mg by mouth daily., Disp: , Rfl:  .  furosemide (LASIX) 20 MG tablet, Take 20 mg by mouth as needed. Take 1 tablet daily as needed can increase to 2 tabs a day for increased swelling., Disp: , Rfl:  .  guaiFENesin (MUCINEX) 600 MG 12 hr tablet, Take 600 mg by mouth daily., Disp: , Rfl:  .  ipratropium-albuterol (DUONEB) 0.5-2.5 (3) MG/3ML  SOLN, Take 3 mLs by nebulization every 6 (six) hours as needed (for shortness of breath)., Disp: , Rfl:  .  levothyroxine (SYNTHROID, LEVOTHROID) 100 MCG tablet, Take 100 mcg by mouth daily., Disp: , Rfl:  .  magnesium chloride (SLOW-MAG) 64 MG TBEC SR tablet, Take 1 tablet by mouth daily., Disp: , Rfl:  .  Multiple Vitamin (MULTIVITAMIN) tablet, Take 1 tablet by mouth daily., Disp: , Rfl:  .  omeprazole (PRILOSEC) 40 MG capsule, Take 40 mg by mouth daily. , Disp: , Rfl:  .  tamsulosin (FLOMAX) 0.4 MG CAPS, Take 0.4 mg by mouth daily., Disp: , Rfl:  .  Umeclidinium-Vilanterol (ANORO ELLIPTA) 62.5-25 MCG/INH AEPB, Inhale 1 puff into the lungs daily., Disp: 30 each, Rfl: 12 .  doxycycline (VIBRAMYCIN) 100 MG capsule, Take 1 capsule (100 mg total) by mouth 2 (two) times daily., Disp: 14 capsule, Rfl: 0 .  predniSONE (DELTASONE) 10 MG tablet, Take 40mg  po daily for 3 days, then take 30mg  po daily for 3 days, then take 20mg  po daily for two days, then take 10mg  po daily for 2 days, Disp: 27 tablet, Rfl: 0

## 2015-05-17 NOTE — Assessment & Plan Note (Signed)
Unfortunately he is having a flare of his COPD right now as he is a wheezing more on exam.  Plan: Chest x-ray Prednisone taper Doxycycline Continue Anoro

## 2015-07-23 ENCOUNTER — Encounter: Payer: Self-pay | Admitting: Pulmonary Disease

## 2015-07-23 ENCOUNTER — Ambulatory Visit (INDEPENDENT_AMBULATORY_CARE_PROVIDER_SITE_OTHER): Payer: Medicare HMO | Admitting: Pulmonary Disease

## 2015-07-23 ENCOUNTER — Ambulatory Visit (INDEPENDENT_AMBULATORY_CARE_PROVIDER_SITE_OTHER)
Admission: RE | Admit: 2015-07-23 | Discharge: 2015-07-23 | Disposition: A | Discharge status: Home / Self Care | Payer: Medicare HMO | Source: Ambulatory Visit | Attending: Pulmonary Disease | Admitting: Pulmonary Disease

## 2015-07-23 ENCOUNTER — Other Ambulatory Visit (INDEPENDENT_AMBULATORY_CARE_PROVIDER_SITE_OTHER): Payer: Medicare HMO

## 2015-07-23 VITALS — BP 126/64 | HR 67 | Ht 67.0 in | Wt 166.2 lb

## 2015-07-23 DIAGNOSIS — J9611 Chronic respiratory failure with hypoxia: Secondary | ICD-10-CM

## 2015-07-23 DIAGNOSIS — I5189 Other ill-defined heart diseases: Secondary | ICD-10-CM

## 2015-07-23 DIAGNOSIS — J439 Emphysema, unspecified: Secondary | ICD-10-CM

## 2015-07-23 DIAGNOSIS — W19XXXA Unspecified fall, initial encounter: Secondary | ICD-10-CM | POA: Insufficient documentation

## 2015-07-23 DIAGNOSIS — R0600 Dyspnea, unspecified: Secondary | ICD-10-CM

## 2015-07-23 DIAGNOSIS — R05 Cough: Secondary | ICD-10-CM | POA: Diagnosis not present

## 2015-07-23 DIAGNOSIS — R053 Chronic cough: Secondary | ICD-10-CM

## 2015-07-23 DIAGNOSIS — I519 Heart disease, unspecified: Secondary | ICD-10-CM

## 2015-07-23 LAB — BASIC METABOLIC PANEL
BUN: 25 mg/dL — ABNORMAL HIGH (ref 6–23)
CALCIUM: 8.9 mg/dL (ref 8.4–10.5)
CO2: 31 meq/L (ref 19–32)
Chloride: 102 mEq/L (ref 96–112)
Creatinine, Ser: 1.19 mg/dL (ref 0.40–1.50)
GFR: 61.3 mL/min (ref >60.00)
GLUCOSE: 91 mg/dL (ref 70–99)
POTASSIUM: 3.9 meq/L (ref 3.5–5.1)
SODIUM: 137 meq/L (ref 135–145)

## 2015-07-23 LAB — CBC
HEMATOCRIT: 37.2 % — AB (ref 39.0–52.0)
HEMOGLOBIN: 12.5 g/dL — AB (ref 13.0–17.0)
MCHC: 33.7 g/dL (ref 30.0–36.0)
MCV: 94.4 fl (ref 78.0–100.0)
PLATELETS: 142 10*3/uL — AB (ref 150.0–400.0)
RBC: 3.94 Mil/uL — AB (ref 4.22–5.81)
RDW: 13 % (ref 11.5–15.5)
WBC: 7.1 10*3/uL (ref 4.0–10.5)

## 2015-07-23 LAB — MAGNESIUM: Magnesium: 1.9 mg/dL (ref 1.5–2.5)

## 2015-07-23 MED ORDER — AMOXICILLIN-POT CLAVULANATE 875-125 MG PO TABS: 1.0000 | ORAL_TABLET | Freq: Two times a day (BID) | ORAL | Status: DC

## 2015-07-23 MED ORDER — PREDNISONE 20 MG PO TABS: 20.0000 mg | ORAL_TABLET | Freq: Every day | ORAL | Status: AC

## 2015-07-23 NOTE — Patient Instructions (Signed)
Please provide [place] with a sample of your mucus We will call you with the results of today's blood work and the chest x-ray Take the Augmentin (antibiotic) for 7 days Take the prednisone for 5 days Let us know if you are not improving Keep taking the Anoro I want your weight on your bathroom scale to be 151-152 pounds. I want you to weigh yourself every day and keep track of it. For the next 3 days I want you to take Lasix twice a day, once your weight has come down to 151 pounds in just to use Lasix once daily. On days when your weight has increased by 3 or more pounds then I want you to take an extra dose of Lasix. We will see you back in 6 weeks or sooner if needed

## 2015-07-23 NOTE — Assessment & Plan Note (Signed)
Continue Mucinex daily, treat COPD exacerbation as above.

## 2015-07-23 NOTE — Assessment & Plan Note (Signed)
For now continue 2 L of oxygen with exertion but we will perform an ambulatory oximetry test today in the office to see if this needs to be adjusted.

## 2015-07-23 NOTE — Assessment & Plan Note (Signed)
He continues to struggle with leg swelling which is also likely contributing to his overall symptom of fatigue and dyspnea. He does have diastolic dysfunction. Today we talked a lot about his goal weight. His weight on his bathroom scale is 10 pounds lower than what we measured today.  Plan: Take an extra dose of Lasix for the next 3 days, then resume daily dosing after that Goal weight is 151 pounds, if he has excessive weight by 3 pounds or more than he is to take an extra dose of Lasix on those days

## 2015-07-23 NOTE — Progress Notes (Signed)
Subjective:    Patient ID: [name] Seller Sr., male    DOB: 1924/12/24, 82 y.o.   MRN: [id number]  Synopsis: former patient of Dr. Gwenette [name] with COPD Smoked cigarettes for many years, from age 75 to age 63 1.5 ppd CXR/CT chest 2014:  Emphysematous changes with mild scarring, +dilated esophagus with A/F level.  PFT"s 08/2012:  Normal FEV1%, but FVC improved 17% with BD.  +++ airtrapping/ +FVL, no restriction, DLCO normal  Echo 09/2012:  Normal LV EF, diastolic dysfxn, mildly dilated LA, RV normal.  CT angio 09/2012:  No PE, small nodule posterior LUL, GG anterior lingula ?significance, scattered areas of atx.  No significant emphysematous changes or ISLD  HPI Chief Complaint  Patient presents with  . Follow-up    prod cough with clear/white mucus still present but improved.  Also notes increased sneezing worse outside.     [name] [name] fell three days ago at [employer].  He has some swelling in his legs. He says that he had been walking around in the department store more than usual and suddenly his legs became weak. Since then he has not had any significant leg swelling. He reports no change in his chronic back pain. He also notes that in the last several weeks she's been having more wheezing and shortness of breath. He has a cough which is sometimes productive of clear mucus. He is not using oxygen enough when he goes out. He says that she sometimes forgets to use it when he walks around. He denies any recent bleeding. He continues to take Lasix daily which he says makes him go to the bathroom frequently. Despite this he says that his leg swelling has been worse. He says that his weight this morning on his bathroom scale was 156 pounds. However, here today he was 166 pounds fully clothed.  Past Medical History  Diagnosis Date  . HTN (hypertension)   . COPD (chronic obstructive pulmonary disease) (HCC)     Mild  . Anemia   . Hypothyroidism       Review of Systems  Constitutional:  Positive for fatigue. Negative for fever and chills.  HENT: Negative for postnasal drip, rhinorrhea and sinus pressure.   Respiratory: Positive for cough, shortness of breath and wheezing.   Cardiovascular: Positive for leg swelling. Negative for chest pain and palpitations.       Objective:   Physical Exam  Filed Vitals:   07/23/15 1335  BP: 126/64  Pulse: 67  Height: 5\' 7"  (1.702 m)  Weight: 166 lb 3.2 oz (75.388 kg)  SpO2: 92%   RA  Gen: well appearing HENT: OP clear, TM's clear, neck supple PULM: Wheezing on exam, normal effort CV: RRR, no mgr, significant leg edema, increased JVD GI: BS+, soft, nontender Derm: no cyanosis or rash Psyche: normal mood and affect  Chest x-ray images from the previous visit were reviewed showing mild scarring in the bases of the lungs with some emphysema but no infiltrate BMET    Component Value Date/Time   NA 134* 10/19/2013 1154   K 4.2 10/19/2013 1154   CL 99 10/19/2013 1154   CO2 26 10/19/2013 1154   GLUCOSE 93 10/19/2013 1154   BUN 20 10/19/2013 1154   CREATININE 1.5 10/19/2013 1154   CREATININE 1.27 10/24/2012 1006   CALCIUM 8.5 10/19/2013 1154   GFRNONAA 63* 10/10/2012 0440   GFRAA 73* 10/10/2012 0440        Assessment & Plan:  COPD (chronic obstructive pulmonary disease) with  emphysema [facility]) Unfortunately [name] [name] is having another exacerbation of his emphysema. Today on exam he has wheezing and rhonchi. It sounds as if mucus burden is more of a problem then wheezing but in general, I'm disappointed that he has required multiple treatments with antibiotics and prednisone in the last year. He remains compliant with his medications.  Because of his recurrent exacerbations I would like to collect a sputum sample to make sure there is no evidence of an atypical infection. We may need to consider using daily azithromycin to prevent these exacerbations in the future, but before we do that we need to rule out an underlying  mycobacterial disease. Also, with his history of a dilated esophagus seen on CT chest we may need to consider a barium swallow.  Plan: Continue Anoro Brief prednisone taper Augmentin 7 days Sputum culture for bacteria, fungal, AFB Return to clinic in 6 weeks, if AFB culture negative then start daily azithromycin  Chronic respiratory failure ([place]) For now continue 2 L of oxygen with exertion but we will perform an ambulatory oximetry test today in the office to see if this needs to be adjusted.  Chronic cough Continue Mucinex daily, treat COPD exacerbation as above.  Diastolic dysfunction He continues to struggle with leg swelling which is also likely contributing to his overall symptom of fatigue and dyspnea. He does have diastolic dysfunction. Today we talked a lot about his goal weight. His weight on his bathroom scale is 10 pounds lower than what we measured today.  Plan: Take an extra dose of Lasix for the next 3 days, then resume daily dosing after that Goal weight is 151 pounds, if he has excessive weight by 3 pounds or more than he is to take an extra dose of Lasix on those days  Fall He had a fall after walking excessively around a department store a few days ago. He says that his legs just became weak. On exam today he has no evidence of leg weakness. Because of his multiple chronic medical issues I think we need to check for anemia and evidence of an underlying metabolic problem. I'm going to check a CBC and a basic metabolic panel and a magnesium.     Current outpatient prescriptions:  .  Calcium Carbonate-Vitamin D (CALCIUM-VITAMIN D) 500-200 MG-UNIT tablet, Take 1 tablet by mouth daily. , Disp: , Rfl:  .  diltiazem (TIAZAC) 120 MG 24 hr capsule, Take 120 mg by mouth daily., Disp: , Rfl:  .  Ferrous Sulfate (IRON) 28 MG TABS, Take 1 tablet by mouth daily., Disp: , Rfl:  .  finasteride (PROSCAR) 5 MG tablet, Take 5 mg by mouth daily., Disp: , Rfl:  .  furosemide (LASIX)  20 MG tablet, Take 20 mg by mouth as needed. Take 1 tablet daily as needed can increase to 2 tabs a day for increased swelling., Disp: , Rfl:  .  guaiFENesin (MUCINEX) 600 MG 12 hr tablet, Take 600 mg by mouth daily., Disp: , Rfl:  .  ipratropium-albuterol (DUONEB) 0.5-2.5 (3) MG/3ML SOLN, Take 3 mLs by nebulization every 6 (six) hours as needed (for shortness of breath)., Disp: , Rfl:  .  levothyroxine (SYNTHROID, LEVOTHROID) 100 MCG tablet, Take 100 mcg by mouth daily., Disp: , Rfl:  .  Multiple Vitamin (MULTIVITAMIN) tablet, Take 1 tablet by mouth daily., Disp: , Rfl:  .  omeprazole (PRILOSEC) 40 MG capsule, Take 40 mg by mouth daily. , Disp: , Rfl:  .  tamsulosin (FLOMAX) 0.4 MG  CAPS, Take 0.4 mg by mouth daily., Disp: , Rfl:  .  Umeclidinium-Vilanterol (ANORO ELLIPTA) 62.5-25 MCG/INH AEPB, Inhale 1 puff into the lungs daily., Disp: 30 each, Rfl: 12 .  amoxicillin-clavulanate (AUGMENTIN) 875-125 MG tablet, Take 1 tablet by mouth 2 (two) times daily., Disp: 14 tablet, Rfl: 0 .  predniSONE (DELTASONE) 20 MG tablet, Take 1 tablet (20 mg total) by mouth daily with breakfast., Disp: 5 tablet, Rfl: 0

## 2015-07-23 NOTE — Assessment & Plan Note (Signed)
He had a fall after walking excessively around a department store a few days ago. He says that his legs just became weak. On exam today he has no evidence of leg weakness. Because of his multiple chronic medical issues I think we need to check for anemia and evidence of an underlying metabolic problem. I'm going to check a CBC and a basic metabolic panel and a magnesium.

## 2015-07-23 NOTE — Assessment & Plan Note (Signed)
Unfortunately Mr. Stull is having another exacerbation of his emphysema. Today on exam he has wheezing and rhonchi. It sounds as if mucus burden is more of a problem then wheezing but in general, I'm disappointed that he has required multiple treatments with antibiotics and prednisone in the last year. He remains compliant with his medications.  Because of his recurrent exacerbations I would like to collect a sputum sample to make sure there is no evidence of an atypical infection. We may need to consider using daily azithromycin to prevent these exacerbations in the future, but before we do that we need to rule out an underlying mycobacterial disease. Also, with his history of a dilated esophagus seen on CT chest we may need to consider a barium swallow.  Plan: Continue Anoro Brief prednisone taper Augmentin 7 days Sputum culture for bacteria, fungal, AFB Return to clinic in 6 weeks, if AFB culture negative then start daily azithromycin

## 2015-07-24 ENCOUNTER — Other Ambulatory Visit: Payer: Medicare HMO

## 2015-07-24 DIAGNOSIS — R053 Chronic cough: Secondary | ICD-10-CM

## 2015-07-24 DIAGNOSIS — R05 Cough: Secondary | ICD-10-CM

## 2015-07-28 LAB — LOWER RESPIRATORY CULTURE

## 2015-07-29 ENCOUNTER — Other Ambulatory Visit: Payer: Self-pay

## 2015-07-29 MED ORDER — LEVOFLOXACIN 500 MG PO TABS: 500.0000 mg | ORAL_TABLET | Freq: Every day | ORAL | Status: DC

## 2015-08-28 LAB — FUNGUS CULTURE W SMEAR

## 2015-09-03 ENCOUNTER — Ambulatory Visit (INDEPENDENT_AMBULATORY_CARE_PROVIDER_SITE_OTHER): Payer: Medicare HMO | Admitting: Adult Health

## 2015-09-03 ENCOUNTER — Encounter: Payer: Self-pay | Admitting: Adult Health

## 2015-09-03 VITALS — BP 142/72 | HR 75 | Temp 98.1°F | Ht 67.0 in | Wt 160.0 lb

## 2015-09-03 DIAGNOSIS — J439 Emphysema, unspecified: Secondary | ICD-10-CM

## 2015-09-03 DIAGNOSIS — I5189 Other ill-defined heart diseases: Secondary | ICD-10-CM

## 2015-09-03 DIAGNOSIS — I519 Heart disease, unspecified: Secondary | ICD-10-CM | POA: Diagnosis not present

## 2015-09-03 MED ORDER — FLUTTER DEVI: Status: DC

## 2015-09-03 NOTE — Progress Notes (Signed)
Subjective:    Patient ID: [name] Seller Sr., male    DOB: 02/21/1925, 82 y.o.   MRN: [id number]  HPI  Synopsis: former patient of Dr. Gwenette Greet with COPD Smoked cigarettes for many years, from age 15 to age 50 1.5 ppd CXR/CT chest 2014:  Emphysematous changes with mild scarring, +dilated esophagus with A/F level.  PFT"s 08/2012:  Normal FEV1%, but FVC improved 17% with BD.  +++ airtrapping/ +FVL, no restriction, DLCO normal  Echo 09/2012:  Normal LV EF, diastolic dysfxn, mildly dilated LA, RV normal.  CT angio 09/2012:  No PE, small nodule posterior LUL, GG anterior lingula ?significance, scattered areas of atx.  No significant emphysematous changes or ISLD  09/03/2015 Acute OV  : COPD  Pt returns for an acute office visit.  Seen by PCP last week. Started on prednisone taper for COPD flare . Says he started yesterday.  Also started on Daliresp. Says it is going to be too expensive, can not afford this.  CXR done 08/29/15 reviewed with COPD changes, no sign of PNA.  Was told to come to our office for evaluation .  Still have congestion, tightness and worse cough for last 2 week. Minimally productive with thick stringy mucus. Hard to get up at time ,feels mucus gets stuck in chest. No discolored mucus or feer.  He remains on ANORO daily . Uses duoneb As needed  .  Says leg swelling is better.  Sputum cx last month showed psuedomonas -pansensitive . He was treated with Levaquin.  Congestion some better but cough is not gone.    Review of Systems Constitutional:   No  weight loss, night sweats,  Fevers, chills,  +fatigue, or  lassitude.  HEENT:   No headaches,  Difficulty swallowing,  Tooth/dental problems, or  Sore throat,                No sneezing, itching, ear ache, nasal congestion, post nasal drip,   CV:  No chest pain,  Orthopnea, PND, swelling in lower extremities, anasarca, dizziness, palpitations, syncope.   GI  No heartburn, indigestion, abdominal pain, nausea, vomiting,  diarrhea, change in bowel habits, loss of appetite, bloody stools.   Resp:  .  No chest wall deformity  Skin: no rash or lesions.  GU: no dysuria, change in color of urine, no urgency or frequency.  No flank pain, no hematuria   MS:  No joint pain or swelling.  No decreased range of motion.  No back pain.  Psych:  No change in mood or affect. No depression or anxiety.  No memory loss.         Objective:   Physical Exam Filed Vitals:   09/03/15 1433  BP: 142/72  Pulse: 75  Temp: 98.1 F (36.7 C)  TempSrc: Oral  Height: 5\' 7"  (1.702 m)  Weight: 160 lb (72.576 kg)  SpO2: 95%   GEN: A/Ox3; pleasant , NAD, elderly   HEENT:  [place]/AT,  EACs-clear, TMs-wnl, NOSE-clear, THROAT-clear, no lesions, no postnasal drip or exudate noted.   NECK:  Supple w/ fair ROM; no JVD; normal carotid impulses w/o bruits; no thyromegaly or nodules palpated; no lymphadenopathy.  RESP  Few trace rhonchi , rattling cough , no accessory muscle use, no dullness to percussion  CARD:  RRR, no m/r/g  , no peripheral edema, pulses intact, no cyanosis or clubbing.  GI:   Soft & nt; nml bowel sounds; no organomegaly or masses detected.  Musco: Warm bil, no deformities or joint  swelling noted.   Neuro: alert, no focal deficits noted.    Skin: Warm, no lesions or rashes  [name] Mcguiness NP-C  [facility]  09/03/15        Assessment & Plan:

## 2015-09-03 NOTE — Patient Instructions (Addendum)
Finish Prednisone taper as directed .  Mucinex DM Twice daily  .As needed  Cough/congestion  Add Flutter valve Three times a day   Please contact office for sooner follow up if symptoms do not improve or worsen or seek emergency care  Follow up Dr. [place] Bells as planned and As needed

## 2015-09-06 NOTE — Assessment & Plan Note (Signed)
Appears compensated  Cont on current regimen .  

## 2015-09-06 NOTE — Assessment & Plan Note (Signed)
Exacerbation -recurrent  psuedomonas bronchitis recently treated with abx.  Hold on abx at this time  Finish steroids .  Add flutter valve to help with pulmonary hygiene   Plan  Finish Prednisone taper as directed .  Mucinex DM Twice daily  .As needed  Cough/congestion  Add Flutter valve Three times a day   Please contact office for sooner follow up if symptoms do not improve or worsen or seek emergency care  Follow up Dr. [place] Bells as planned and As needed

## 2015-09-12 ENCOUNTER — Ambulatory Visit: Payer: Medicare HMO | Admitting: Pulmonary Disease

## 2015-09-13 ENCOUNTER — Encounter: Payer: Self-pay | Admitting: Pulmonary Disease

## 2015-09-13 ENCOUNTER — Ambulatory Visit (INDEPENDENT_AMBULATORY_CARE_PROVIDER_SITE_OTHER): Payer: Medicare HMO | Admitting: Pulmonary Disease

## 2015-09-13 ENCOUNTER — Other Ambulatory Visit (INDEPENDENT_AMBULATORY_CARE_PROVIDER_SITE_OTHER): Payer: Medicare HMO

## 2015-09-13 VITALS — BP 134/68 | HR 85 | Temp 98.1°F | Ht 67.0 in | Wt 157.0 lb

## 2015-09-13 DIAGNOSIS — J439 Emphysema, unspecified: Secondary | ICD-10-CM

## 2015-09-13 DIAGNOSIS — Z5181 Encounter for therapeutic drug level monitoring: Secondary | ICD-10-CM | POA: Diagnosis not present

## 2015-09-13 DIAGNOSIS — J9611 Chronic respiratory failure with hypoxia: Secondary | ICD-10-CM | POA: Diagnosis not present

## 2015-09-13 DIAGNOSIS — J449 Chronic obstructive pulmonary disease, unspecified: Secondary | ICD-10-CM

## 2015-09-13 DIAGNOSIS — D509 Iron deficiency anemia, unspecified: Secondary | ICD-10-CM | POA: Insufficient documentation

## 2015-09-13 DIAGNOSIS — I509 Heart failure, unspecified: Secondary | ICD-10-CM | POA: Diagnosis not present

## 2015-09-13 LAB — CBC WITH DIFFERENTIAL/PLATELET
BASOS PCT: 0.1 % (ref 0.0–3.0)
Basophils Absolute: 0 10*3/uL (ref 0.0–0.1)
EOS PCT: 0.3 % (ref 0.0–5.0)
Eosinophils Absolute: 0 10*3/uL (ref 0.0–0.7)
HCT: 38.7 % — ABNORMAL LOW (ref 39.0–52.0)
Hemoglobin: 13.1 g/dL (ref 13.0–17.0)
LYMPHS ABS: 0.8 10*3/uL (ref 0.7–4.0)
Lymphocytes Relative: 7.3 % — ABNORMAL LOW (ref 12.0–46.0)
MCHC: 33.9 g/dL (ref 30.0–36.0)
MCV: 93.4 fl (ref 78.0–100.0)
MONOS PCT: 7.2 % (ref 3.0–12.0)
Monocytes Absolute: 0.8 10*3/uL (ref 0.1–1.0)
NEUTROS ABS: 9.2 10*3/uL — AB (ref 1.4–7.7)
NEUTROS PCT: 85.1 % — AB (ref 43.0–77.0)
PLATELETS: 135 10*3/uL — AB (ref 150.0–400.0)
RBC: 4.15 Mil/uL — ABNORMAL LOW (ref 4.22–5.81)
RDW: 13.2 % (ref 11.5–15.5)
WBC: 10.8 10*3/uL — ABNORMAL HIGH (ref 4.0–10.5)

## 2015-09-13 LAB — BASIC METABOLIC PANEL
BUN: 20 mg/dL (ref 6–23)
CALCIUM: 8.9 mg/dL (ref 8.4–10.5)
CO2: 27 meq/L (ref 19–32)
CREATININE: 1 mg/dL (ref 0.40–1.50)
Chloride: 103 mEq/L (ref 96–112)
GFR: 74.91 mL/min (ref >60.00)
Glucose, Bld: 96 mg/dL (ref 70–99)
Potassium: 3.9 mEq/L (ref 3.5–5.1)
Sodium: 137 mEq/L (ref 135–145)

## 2015-09-13 LAB — HEPATIC FUNCTION PANEL
ALK PHOS: 59 U/L (ref 39–117)
ALT: 16 U/L (ref 0–53)
AST: 19 U/L (ref 0–37)
Albumin: 3.7 g/dL (ref 3.5–5.2)
BILIRUBIN DIRECT: 0.2 mg/dL (ref 0.0–0.3)
Total Bilirubin: 0.9 mg/dL (ref 0.2–1.2)
Total Protein: 6.7 g/dL (ref 6.0–8.3)

## 2015-09-13 MED ORDER — AZITHROMYCIN 250 MG PO TABS: 250.0000 mg | ORAL_TABLET | Freq: Every day | ORAL | Status: DC

## 2015-09-13 NOTE — Assessment & Plan Note (Signed)
This has improved with Lasix use. He is now using Lasix as needed for leg swelling or if his weight goes above 158 pounds.  I stressed to him today that his goal weight is 155 pounds.  Plan: Basic metabolic panel to check potassium level Use Lasix as needed for leg swelling or weight above 158 pounds

## 2015-09-13 NOTE — Progress Notes (Signed)
Subjective:    Patient ID: [name] Seller Sr., male    DOB: 1924/09/27, 82 y.o.   MRN: [id number]  Synopsis: former patient of Dr. Gwenette Greet with COPD Smoked cigarettes for many years, from age 25 to age 41 1.5 ppd CXR/CT chest 2014:  Emphysematous changes with mild scarring, +dilated esophagus with A/F level.  PFT"s 08/2012:  Normal FEV1%, but FVC improved 17% with BD.  +++ airtrapping/ +FVL, no restriction, DLCO normal  Echo 09/2012:  Normal LV EF, diastolic dysfxn, mildly dilated LA, RV normal.  CT angio 09/2012:  No PE, small nodule posterior LUL, GG anterior lingula ?significance, scattered areas of atx.  No significant emphysematous changes or ISLD  HPI Chief Complaint  Patient presents with  . Follow-up    pt saw TP for acute ov on 3/21- no better since visit.  c/o body weakness, fatigue, sob, prod cough with clear mucus.     Kelin says that he still has no energy, still short of breath.  He is weak in his legs.   He feels like he is breathing a little better, not wheezing so back.   He is still coughing up clear liquid looking.  He has a concentrator in the house, hasn't used it in six months. Family hasn't seen him too short of breath. He started Daliresp, he doesn't think it has caused any new problems.  This was started about 10 days ago.  This costs $270 a month. He hasn't taken the lasix in about three days.   Past Medical History  Diagnosis Date  . HTN (hypertension)   . COPD (chronic obstructive pulmonary disease) (HCC)     Mild  . Anemia   . Hypothyroidism       Review of Systems  Constitutional: Positive for fatigue. Negative for fever and chills.  HENT: Negative for postnasal drip, rhinorrhea and sinus pressure.   Respiratory: Positive for shortness of breath. Negative for cough and wheezing.   Cardiovascular: Negative for chest pain, palpitations and leg swelling.       Objective:   Physical Exam  Filed Vitals:   09/13/15 1331  BP: 134/68  Pulse: 85   Temp: 98.1 F (36.7 C)  TempSrc: Oral  Height: 5\' 7"  (1.702 m)  Weight: 157 lb (71.215 kg)  SpO2: 96%   RA  Gen: well appearing HENT: OP clear, TM's clear, neck supple PULM: CTA B today, normal effort CV: RRR, no mgr, significant leg edema, increased JVD GI: BS+, soft, nontender Derm: no cyanosis or rash Psyche: normal mood and affect   BMET    Component Value Date/Time   NA 137 07/23/2015 1425   K 3.9 07/23/2015 1425   CL 102 07/23/2015 1425   CO2 31 07/23/2015 1425   GLUCOSE 91 07/23/2015 1425   BUN 25* 07/23/2015 1425   CREATININE 1.19 07/23/2015 1425   CREATININE 1.27 10/24/2012 1006   CALCIUM 8.9 07/23/2015 1425   GFRNONAA 63* 10/10/2012 0440   GFRAA 73* 10/10/2012 0440        Assessment & Plan:  COPD (chronic obstructive pulmonary disease) with emphysema ([place]) He has recovered from his most recent exacerbation of COPD and he finally is not wheezing on exam today. However, he is profoundly deconditioned. He has severe airflow obstruction.  Plan: Continue Anoro Start daily azithromycin if EKG okay, will need quarterly EKG and LFTs LFTs today Stopped Roflumilast Pulmonary rehabilitation referral Follow-up 3 months  Chronic respiratory failure ([place]) Ambulatory oximetry monitoring today.  Diastolic dysfunction This  has improved with Lasix use. He is now using Lasix as needed for leg swelling or if his weight goes above 158 pounds.  I stressed to him today that his goal weight is 155 pounds.  Plan: Basic metabolic panel to check potassium level Use Lasix as needed for leg swelling or weight above 158 pounds  Anemia, iron deficiency He has iron deficiency anemia and most recently on labs from his primary care physician's office he was noted to be anemic once again. He has not reported any bleeding.  Plan: Continue iron supplementation Repeat CBC today     Current outpatient prescriptions:  .  Calcium Carbonate-Vitamin D (CALCIUM-VITAMIN D)  500-200 MG-UNIT tablet, Take 1 tablet by mouth daily. , Disp: , Rfl:  .  diltiazem (TIAZAC) 120 MG 24 hr capsule, Take 120 mg by mouth daily., Disp: , Rfl:  .  Ferrous Sulfate (IRON) 28 MG TABS, Take 1 tablet by mouth daily., Disp: , Rfl:  .  finasteride (PROSCAR) 5 MG tablet, Take 5 mg by mouth daily., Disp: , Rfl:  .  furosemide (LASIX) 20 MG tablet, Take 20 mg by mouth as needed. Take 1 tablet daily as needed can increase to 2 tabs a day for increased swelling., Disp: , Rfl:  .  guaiFENesin (MUCINEX) 600 MG 12 hr tablet, Take 600 mg by mouth daily., Disp: , Rfl:  .  ipratropium-albuterol (DUONEB) 0.5-2.5 (3) MG/3ML SOLN, Take 3 mLs by nebulization every 6 (six) hours as needed (for shortness of breath)., Disp: , Rfl:  .  levothyroxine (SYNTHROID, LEVOTHROID) 100 MCG tablet, Take 100 mcg by mouth daily., Disp: , Rfl:  .  Multiple Vitamin (MULTIVITAMIN) tablet, Take 1 tablet by mouth daily., Disp: , Rfl:  .  omeprazole (PRILOSEC) 40 MG capsule, Take 40 mg by mouth daily. , Disp: , Rfl:  .  predniSONE (DELTASONE) 20 MG tablet, Take 20 mg by mouth daily with breakfast. TAKE ONE TABLET DAILY FOR 5 DAYS, Disp: , Rfl:  .  Respiratory Therapy Supplies (FLUTTER) DEVI, Use as directed, Disp: 1 each, Rfl: 0 .  roflumilast (DALIRESP) 500 MCG TABS tablet, Take 500 mcg by mouth daily., Disp: , Rfl:  .  tamsulosin (FLOMAX) 0.4 MG CAPS, Take 0.4 mg by mouth daily., Disp: , Rfl:  .  Umeclidinium-Vilanterol (ANORO ELLIPTA) 62.5-25 MCG/INH AEPB, Inhale 1 puff into the lungs daily., Disp: 30 each, Rfl: 12

## 2015-09-13 NOTE — Patient Instructions (Addendum)
We will refer you to pulmonary rehabilitation We will call you with the results of the bloodwork Start taking azithromycin daily 250mg  daily Keep using your inhalers as you're doing Take Lasix on days when your weight is up above 158 pounds I will see back in 3 months or sooner if needed

## 2015-09-13 NOTE — Assessment & Plan Note (Signed)
He has recovered from his most recent exacerbation of COPD and he finally is not wheezing on exam today. However, he is profoundly deconditioned. He has severe airflow obstruction.  Plan: Continue Anoro Start daily azithromycin if EKG okay, will need quarterly EKG and LFTs LFTs today Stopped Roflumilast Pulmonary rehabilitation referral Follow-up 3 months

## 2015-09-13 NOTE — Assessment & Plan Note (Signed)
Ambulatory oximetry monitoring today.

## 2015-09-13 NOTE — Assessment & Plan Note (Signed)
He has iron deficiency anemia and most recently on labs from his primary care physician's office he was noted to be anemic once again. He has not reported any bleeding.  Plan: Continue iron supplementation Repeat CBC today

## 2015-10-18 ENCOUNTER — Telehealth: Payer: Self-pay | Admitting: Pulmonary Disease

## 2015-10-18 DIAGNOSIS — J432 Centrilobular emphysema: Secondary | ICD-10-CM

## 2015-10-18 NOTE — Telephone Encounter (Signed)
Wrong order was placed for pulmonary rehab. Correct order will be placed. [name] [name] is aware.  BQ - please advise which Gold class the pt's COPD is. Thanks!!

## 2015-10-28 NOTE — Telephone Encounter (Signed)
Correct order placed. 

## 2015-10-28 NOTE — Telephone Encounter (Signed)
Call it centrilobular emphysema

## 2015-11-01 ENCOUNTER — Encounter: Payer: Self-pay | Admitting: Adult Health

## 2015-11-01 ENCOUNTER — Ambulatory Visit (INDEPENDENT_AMBULATORY_CARE_PROVIDER_SITE_OTHER): Payer: Medicare HMO | Admitting: Adult Health

## 2015-11-01 VITALS — BP 124/60 | HR 79 | Temp 97.4°F | Ht 66.0 in | Wt 162.0 lb

## 2015-11-01 DIAGNOSIS — J439 Emphysema, unspecified: Secondary | ICD-10-CM

## 2015-11-01 NOTE — Assessment & Plan Note (Signed)
Exacerbation -resolving   Plan  Finish Prednisone taper as directed .  Mucinex DM Twice daily  .As needed  Cough/congestion  Continue  on Flutter valve Three times a day   Please contact office for sooner follow up if symptoms do not improve or worsen or seek emergency care  Follow up Dr. Lake Bells as planned and As needed

## 2015-11-01 NOTE — Progress Notes (Signed)
Subjective:    Patient ID: [name] Seller Sr., male    DOB: 09-18-1924, 82 y.o.   MRN: [id number]  HPI 82 year old male former smoker followed for emphysema  Synopsis: former patient of Dr. Gwenette Greet with COPD Smoked cigarettes for many years, from age 43 to age 56 1.5 ppd CXR/CT chest 2014:  Emphysematous changes with mild scarring, +dilated esophagus with A/F level.  PFT"s 08/2012:  Normal FEV1%, but FVC improved 17% with BD.  +++ airtrapping/ +FVL, no restriction, DLCO normal  Echo 09/2012:  Normal LV EF, diastolic dysfxn, mildly dilated LA, RV normal.  CT angio 09/2012:  No PE, small nodule posterior LUL, GG anterior lingula ?significance, scattered areas of atx.  No significant emphysematous changes or ISLD  11/01/2015 ER follow up  Pt returns for a ER follow up .  Seen in ER on 5/17 for COPD exacerbation .  He remains on ANORO daily . Uses duoneb As needed  .  CTA Chest was Negative for PE. Emphysema in the apices, bibasilar atelectasis. There was noted 3 cm soft tissue lesion along the right scapula with no bony destruction. BNP was normal. He was given steroid taper. Has 2 days left.  Remains on ANORO daily . And Azithromycin daily.  Patient is feeling some better. Gets winded easily . Gets nervous easily.  Good appetite .  He denies any hemoptysis, chest pain, orthopnea, PND, or increased leg swelling PVX is utd.    Past Medical History  Diagnosis Date  . HTN (hypertension)   . COPD (chronic obstructive pulmonary disease) (HCC)     Mild  . Anemia   . Hypothyroidism    Current Outpatient Prescriptions on File Prior to Visit  Medication Sig Dispense Refill  . azithromycin (ZITHROMAX) 250 MG tablet Take 1 tablet (250 mg total) by mouth daily. 30 tablet 5  . Calcium Carbonate-Vitamin D (CALCIUM-VITAMIN D) 500-200 MG-UNIT tablet Take 1 tablet by mouth daily.     Marland Kitchen diltiazem (TIAZAC) 120 MG 24 hr capsule Take 120 mg by mouth daily.    . Ferrous Sulfate (IRON) 28 MG TABS Take 1  tablet by mouth daily.    . finasteride (PROSCAR) 5 MG tablet Take 5 mg by mouth daily.    . furosemide (LASIX) 20 MG tablet Take 20 mg by mouth as needed. Take 1 tablet daily as needed can increase to 2 tabs a day for increased swelling.    Marland Kitchen guaiFENesin (MUCINEX) 600 MG 12 hr tablet Take 600 mg by mouth daily.    Marland Kitchen ipratropium-albuterol (DUONEB) 0.5-2.5 (3) MG/3ML SOLN Take 3 mLs by nebulization every 6 (six) hours as needed (for shortness of breath).    Marland Kitchen levothyroxine (SYNTHROID, LEVOTHROID) 100 MCG tablet Take 100 mcg by mouth daily.    . Multiple Vitamin (MULTIVITAMIN) tablet Take 1 tablet by mouth daily.    Marland Kitchen omeprazole (PRILOSEC) 40 MG capsule Take 40 mg by mouth daily.     . predniSONE (DELTASONE) 20 MG tablet Take 20 mg by mouth daily with breakfast. TAKE ONE TABLET DAILY FOR 5 DAYS    . Respiratory Therapy Supplies (FLUTTER) DEVI Use as directed 1 each 0  . tamsulosin (FLOMAX) 0.4 MG CAPS Take 0.4 mg by mouth daily.    Marland Kitchen Umeclidinium-Vilanterol (ANORO ELLIPTA) 62.5-25 MCG/INH AEPB Inhale 1 puff into the lungs daily. 30 each 12   No current facility-administered medications on file prior to visit.      Review of Systems Constitutional:   No  weight loss,  night sweats,  Fevers, chills,  +fatigue, or  lassitude.  HEENT:   No headaches,  Difficulty swallowing,  Tooth/dental problems, or  Sore throat,                No sneezing, itching, ear ache, nasal congestion, post nasal drip,   CV:  No chest pain,  Orthopnea, PND, swelling in lower extremities, anasarca, dizziness, palpitations, syncope.   GI  No heartburn, indigestion, abdominal pain, nausea, vomiting, diarrhea, change in bowel habits, loss of appetite, bloody stools.   Resp:  .  No chest wall deformity  Skin: no rash or lesions.  GU: no dysuria, change in color of urine, no urgency or frequency.  No flank pain, no hematuria   MS:  No joint pain or swelling.  No decreased range of motion.  No back pain.  Psych:  No  change in mood or affect. No depression or anxiety.  No memory loss.         Objective:   Physical Exam Filed Vitals:   11/01/15 1358  BP: 124/60  Pulse: 79  Temp: 97.4 F (36.3 C)  TempSrc: Oral  Height: 5\' 6"  (XX123456 m)  Weight: 162 lb (73.483 kg)  SpO2: 92%   GEN: A/Ox3; pleasant , NAD, elderly   HEENT:  [place]/AT,  EACs-clear, TMs-wnl, NOSE-clear, THROAT-clear, no lesions, no postnasal drip or exudate noted.   NECK:  Supple w/ fair ROM; no JVD; normal carotid impulses w/o bruits; no thyromegaly or nodules palpated; no lymphadenopathy.  RESP  Few trace rhonchi , rattling cough , no accessory muscle use, no dullness to percussion  CARD:  RRR, no m/r/g  , no peripheral edema, pulses intact, no cyanosis or clubbing.  GI:   Soft & nt; nml bowel sounds; no organomegaly or masses detected.  Musco: Warm bil, no deformities or joint swelling noted.   Neuro: alert, no focal deficits noted.    Skin: Warm, no lesions or rashes Scaly nevus along right posterior chest wall.   Chasitee Zenker NP-C  [facility]  09/03/15        Assessment & Plan:

## 2015-11-01 NOTE — Patient Instructions (Addendum)
Finish Prednisone taper as directed .  Mucinex DM Twice daily  .As needed  Cough/congestion  Continue  on Flutter valve Three times a day   Follow up with Primary MD regarding abnormal area on CT on right shoulder blade.  Please contact office for sooner follow up if symptoms do not improve or worsen or seek emergency care  Follow up Dr. Lake Bells as planned and As needed

## 2015-11-05 NOTE — Progress Notes (Signed)
Chart reviewed, I agree with this plan of care

## 2015-12-20 ENCOUNTER — Other Ambulatory Visit (INDEPENDENT_AMBULATORY_CARE_PROVIDER_SITE_OTHER): Payer: Medicare HMO

## 2015-12-20 ENCOUNTER — Ambulatory Visit (INDEPENDENT_AMBULATORY_CARE_PROVIDER_SITE_OTHER): Payer: Medicare HMO | Admitting: Pulmonary Disease

## 2015-12-20 ENCOUNTER — Encounter: Payer: Self-pay | Admitting: Pulmonary Disease

## 2015-12-20 VITALS — BP 122/60 | HR 74 | Ht 67.0 in | Wt 156.0 lb

## 2015-12-20 DIAGNOSIS — Z5181 Encounter for therapeutic drug level monitoring: Secondary | ICD-10-CM

## 2015-12-20 DIAGNOSIS — J439 Emphysema, unspecified: Secondary | ICD-10-CM | POA: Diagnosis not present

## 2015-12-20 LAB — HEPATIC FUNCTION PANEL
ALT: 25 U/L (ref 0–53)
AST: 18 U/L (ref 0–37)
Albumin: 3.9 g/dL (ref 3.5–5.2)
Alkaline Phosphatase: 55 U/L (ref 39–117)
BILIRUBIN DIRECT: 0.1 mg/dL (ref 0.0–0.3)
BILIRUBIN TOTAL: 0.8 mg/dL (ref 0.2–1.2)
TOTAL PROTEIN: 6.8 g/dL (ref 6.0–8.3)

## 2015-12-20 MED ORDER — BUDESONIDE 0.25 MG/2ML IN SUSP: 0.2500 mg | Freq: Two times a day (BID) | RESPIRATORY_TRACT | Status: DC

## 2015-12-20 NOTE — Assessment & Plan Note (Signed)
Weight stable today

## 2015-12-20 NOTE — Assessment & Plan Note (Signed)
He had an abnormal finding on his CT chest but it sounds as if it was a soft tissue her scapular nodule. His daughter will discuss this with his primary care physician is the radiologist recommended an MRI.

## 2015-12-20 NOTE — Progress Notes (Signed)
Subjective:    Patient ID: [name] Seller Sr., male    DOB: 03-07-1925, 82 y.o.   MRN: [id number]  Synopsis: former patient of Dr. Gwenette Greet with COPD Smoked cigarettes for many years, from age 9 to age 47 1.5 ppd CXR/CT chest 2014:  Emphysematous changes with mild scarring, +dilated esophagus with A/F level.  PFT"s 08/2012:  Normal FEV1%, but FVC improved 17% with BD.  +++ airtrapping/ +FVL, no restriction, DLCO normal  Echo 09/2012:  Normal LV EF, diastolic dysfxn, mildly dilated LA, RV normal.  CT angio 09/2012:  No PE, small nodule posterior LUL, GG anterior lingula ?significance, scattered areas of atx.  No significant emphysematous changes or ISLD  HPI Chief Complaint  Patient presents with  . Follow-up    c/o slight increase SOB, prod cough (grey phlem), some wheezing/chest tightness.    Cardae went ot the ER a few weeks ago when he felt dizzy and chest tightness and his oxygen was low at the time.  This came on all the sudden in the middle of the night.  He was given breathing treatments x2.   Apart from that he has been doing OK. Last week he was given prednisone and an antibiotic again for a COPD exacerbation he believes.  He was supposed to take the medication every other day.  He had chest congestion at the time and was given a shot in the hip at the time.  He was also told to hold his blood pressure medicine when he went back yesterday as well.   He continues to take his Anoro, duoneb twice a day as needed for dyspnea.   He has been keeping track of his weight really well. He continues to due really well with prednisone.  Past Medical History  Diagnosis Date  . HTN (hypertension)   . COPD (chronic obstructive pulmonary disease) (HCC)     Mild  . Anemia   . Hypothyroidism       Review of Systems  Constitutional: Negative for fever, chills and fatigue.  HENT: Negative for postnasal drip, rhinorrhea and sinus pressure.   Respiratory: Negative for cough, shortness of  breath and wheezing.   Cardiovascular: Positive for leg swelling. Negative for chest pain and palpitations.       Objective:   Physical Exam  Filed Vitals:   12/20/15 1346  BP: 122/60  Pulse: 74  Height: 5\' 7"  (1.702 m)  Weight: 156 lb (70.761 kg)  SpO2: 96%   RA  Gen: well appearing HENT: OP clear, TM's clear, neck supple PULM: Wheezing on exam, normal effort CV: RRR, no mgr, significant leg edema, increased JVD GI: BS+, soft, nontender Derm: no cyanosis or rash Psyche: normal mood and affect   BMET    Component Value Date/Time   NA 137 09/13/2015 1439   K 3.9 09/13/2015 1439   CL 103 09/13/2015 1439   CO2 27 09/13/2015 1439   GLUCOSE 96 09/13/2015 1439   BUN 20 09/13/2015 1439   CREATININE 1.00 09/13/2015 1439   CREATININE 1.27 10/24/2012 1006   CALCIUM 8.9 09/13/2015 1439   GFRNONAA 63* 10/10/2012 0440   GFRAA 73* 10/10/2012 0440   May 2017 CT chest [facility] showed increasing atelectasis and trace effusions in the bases no pulmonary embolism, there was a scapular lesion seen, MRI recommended  07/2015 Sputum culture> pseudomonas     Assessment & Plan:  COPD (chronic obstructive pulmonary disease) with emphysema () He keeps having repeated episodes of COPD  exacerbations despite compliance with Anoro and his azithromycin.  Today he asked he sounds quite a bit better then have ever heard, this is at completion of prednisone. So it appears that he has a steroid responsive process.  Plan: Check sputum for AFB again And add inhaled corticosteroid with Pulmicort nebulizers Continue Anoro Check liver function testing Continue daily azithromycin Follow-up in 4 weeks, if he seems to be having another exacerbation than we need to consider chronic suppressive antibiotics with inhaled tobramycin versus monthly Cipro considering the Pseudomonas colonization. Alternatively, we may want to consider a therapy vest.  Diastolic dysfunction Weight  stable today  Solitary pulmonary nodule He had an abnormal finding on his CT chest but it sounds as if it was a soft tissue her scapular nodule. His daughter will discuss this with his primary care physician is the radiologist recommended an MRI.     Current outpatient prescriptions:  .  azithromycin (ZITHROMAX) 250 MG tablet, Take 1 tablet (250 mg total) by mouth daily., Disp: 30 tablet, Rfl: 5 .  Calcium Carbonate-Vitamin D (CALCIUM-VITAMIN D) 500-200 MG-UNIT tablet, Take 1 tablet by mouth daily. , Disp: , Rfl:  .  diltiazem (TIAZAC) 120 MG 24 hr capsule, Take 120 mg by mouth daily., Disp: , Rfl:  .  Ferrous Sulfate (IRON) 28 MG TABS, Take 1 tablet by mouth daily., Disp: , Rfl:  .  finasteride (PROSCAR) 5 MG tablet, Take 5 mg by mouth daily., Disp: , Rfl:  .  furosemide (LASIX) 20 MG tablet, Take 20 mg by mouth as needed. Take 1 tablet daily as needed can increase to 2 tabs a day for increased swelling., Disp: , Rfl:  .  guaiFENesin (MUCINEX) 600 MG 12 hr tablet, Take 600 mg by mouth daily., Disp: , Rfl:  .  ipratropium-albuterol (DUONEB) 0.5-2.5 (3) MG/3ML SOLN, Take 3 mLs by nebulization every 6 (six) hours as needed (for shortness of breath)., Disp: , Rfl:  .  levothyroxine (SYNTHROID, LEVOTHROID) 100 MCG tablet, Take 100 mcg by mouth daily., Disp: , Rfl:  .  Multiple Vitamin (MULTIVITAMIN) tablet, Take 1 tablet by mouth daily., Disp: , Rfl:  .  omeprazole (PRILOSEC) 40 MG capsule, Take 40 mg by mouth daily. , Disp: , Rfl:  .  Respiratory Therapy Supplies (FLUTTER) DEVI, Use as directed, Disp: 1 each, Rfl: 0 .  tamsulosin (FLOMAX) 0.4 MG CAPS, Take 0.4 mg by mouth daily., Disp: , Rfl:  .  Umeclidinium-Vilanterol (ANORO ELLIPTA) 62.5-25 MCG/INH AEPB, Inhale 1 puff into the lungs daily., Disp: 30 each, Rfl: 12

## 2015-12-20 NOTE — Assessment & Plan Note (Addendum)
He keeps having repeated episodes of COPD exacerbations despite compliance with Anoro and his azithromycin.  Today he asked he sounds quite a bit better then have ever heard, this is at completion of prednisone. So it appears that he has a steroid responsive process.  Plan: Check sputum for AFB again And add inhaled corticosteroid with Pulmicort nebulizers Continue Anoro Check liver function testing Continue daily azithromycin Follow-up in 4 weeks, if he seems to be having another exacerbation than we need to consider chronic suppressive antibiotics with inhaled tobramycin versus monthly Cipro considering the Pseudomonas colonization. Alternatively, we may want to consider a therapy vest.

## 2015-12-20 NOTE — Patient Instructions (Signed)
Keep taking azithromycin daily Keep taking the Anoro daily Take DuoNeb twice a day Start taking Pulmicort twice a day nebulized Your primary care physician should order an MRI to evaluate the lesion in your shoulder blade We'll see you back in 4 weeks with our nurse practitioner

## 2015-12-23 ENCOUNTER — Other Ambulatory Visit: Payer: Self-pay | Admitting: Pulmonary Disease

## 2015-12-23 ENCOUNTER — Other Ambulatory Visit: Payer: Medicare HMO

## 2015-12-23 DIAGNOSIS — J439 Emphysema, unspecified: Secondary | ICD-10-CM

## 2015-12-26 LAB — RESPIRATORY CULTURE OR RESPIRATORY AND SPUTUM CULTURE: Organism ID, Bacteria: NORMAL

## 2016-01-02 ENCOUNTER — Telehealth: Payer: Self-pay | Admitting: Adult Health

## 2016-01-02 NOTE — Telephone Encounter (Signed)
Spoke with pt's granddaughter, states that pt has still not received pulmicort prescribed at last office visit from [facility]  Called pharmacy, states they received the rx on 7/7, and on 7/13 they spoke to a member of the pt's household who stated that pt did not need medication at this time, and to call back to schedule shipment in 2 weeks.  A member of the household must call pharmacy to schedule shipment.  Pt's granddaughter aware.  Nothing further needed.

## 2016-01-17 ENCOUNTER — Ambulatory Visit: Payer: Medicare HMO | Admitting: Adult Health

## 2016-01-28 ENCOUNTER — Encounter: Payer: Self-pay | Admitting: Adult Health

## 2016-01-28 ENCOUNTER — Encounter (INDEPENDENT_AMBULATORY_CARE_PROVIDER_SITE_OTHER): Payer: Self-pay

## 2016-01-28 ENCOUNTER — Ambulatory Visit (INDEPENDENT_AMBULATORY_CARE_PROVIDER_SITE_OTHER): Payer: Medicare HMO | Admitting: Adult Health

## 2016-01-28 DIAGNOSIS — J439 Emphysema, unspecified: Secondary | ICD-10-CM

## 2016-01-28 NOTE — Patient Instructions (Signed)
Continue on current regimen .  Mucinex DM Twice daily  .As needed  Cough/congestion  Continue  on Flutter valve Three times a day   Please contact office for sooner follow up if symptoms do not improve or worsen or seek emergency care  Follow up Dr. Lake Bells in 3 months and As needed

## 2016-01-28 NOTE — Assessment & Plan Note (Signed)
Frequent exacerbation , currently stable .  Recent sputum cx /AFB neg.  Cont max therapy with [facility] /Budeonside/Duoneb/Daily Azithro/Flutter.   Plan  Continue on current regimen .  Mucinex DM Twice daily  .As needed  Cough/congestion  Continue  on Flutter valve Three times a day   Please contact office for sooner follow up if symptoms do not improve or worsen or seek emergency care  Follow up Dr. Lake Bells in 3 months and As needed

## 2016-01-28 NOTE — Progress Notes (Signed)
Subjective:    Patient ID: [name] Seller Sr., male    DOB: [birth year], 82 y.o.   MRN: [id number]  HPI 82 year old male former smoker followed for emphysema  Synopsis: former patient of Dr. Gwenette Greet with COPD Smoked cigarettes for many years, from age 81 to age 10 1.5 ppd CXR/CT chest 2014:  Emphysematous changes with mild scarring, +dilated esophagus with A/F level.  PFT"s 08/2012:  Normal FEV1%, but FVC improved 17% with BD.  +++ airtrapping/ +FVL, no restriction, DLCO normal  Echo 09/2012:  Normal LV EF, diastolic dysfxn, mildly dilated LA, RV normal.  CT angio 09/2012:  No PE, small nodule posterior LUL, GG anterior lingula ?significance, scattered areas of atx.  No significant emphysematous changes or ISLD  01/28/2016 Follow up : Emphysema  Pt returns for 6 week follow up . Says overall he is doing okay.  Last ov started on pulmicort neb Twice daily  .  Sputum cx and AFB neg on 12/23/15 .  He remains on ANORO daily . And Duoneb /Pulmicort neb Twice daily   Remains on Azithromycin daily.  Denies hemoptysis, chest pain, orthopnea, PND, or increased leg swelling PVX is utd.  Recent bronchitis tx w/ augmentin by PCP , feeling better.  Continues to use flutter valve. Does not feel it works as well anymore.     Past Medical History:  Diagnosis Date  . Anemia   . COPD (chronic obstructive pulmonary disease) (HCC)    Mild  . HTN (hypertension)   . Hypothyroidism    Current Outpatient Prescriptions on File Prior to Visit  Medication Sig Dispense Refill  . budesonide (PULMICORT) 0.25 MG/2ML nebulizer solution Take 2 mLs (0.25 mg total) by nebulization 2 (two) times daily. 120 mL 11  . Calcium Carbonate-Vitamin D (CALCIUM-VITAMIN D) 500-200 MG-UNIT tablet Take 1 tablet by mouth daily.     Marland Kitchen diltiazem (TIAZAC) 120 MG 24 hr capsule Take 120 mg by mouth daily.    . Ferrous Sulfate (IRON) 28 MG TABS Take 1 tablet by mouth daily.    . finasteride (PROSCAR) 5 MG tablet Take 5 mg by mouth  daily.    . furosemide (LASIX) 20 MG tablet Take 20 mg by mouth as needed. Take 1 tablet daily as needed can increase to 2 tabs a day for increased swelling.    Marland Kitchen guaiFENesin (MUCINEX) 600 MG 12 hr tablet Take 600 mg by mouth daily.    Marland Kitchen ipratropium-albuterol (DUONEB) 0.5-2.5 (3) MG/3ML SOLN Take 3 mLs by nebulization every 6 (six) hours as needed (for shortness of breath).    Marland Kitchen levothyroxine (SYNTHROID, LEVOTHROID) 100 MCG tablet Take 100 mcg by mouth daily.    . Multiple Vitamin (MULTIVITAMIN) tablet Take 1 tablet by mouth daily.    Marland Kitchen omeprazole (PRILOSEC) 40 MG capsule Take 40 mg by mouth daily.     Marland Kitchen Respiratory Therapy Supplies (FLUTTER) DEVI Use as directed 1 each 0  . tamsulosin (FLOMAX) 0.4 MG CAPS Take 0.4 mg by mouth daily.    Marland Kitchen Umeclidinium-Vilanterol (ANORO ELLIPTA) 62.5-25 MCG/INH AEPB Inhale 1 puff into the lungs daily. 30 each 12  . azithromycin (ZITHROMAX) 250 MG tablet Take 1 tablet (250 mg total) by mouth daily. (Patient not taking: Reported on 01/28/2016) 30 tablet 5   No current facility-administered medications on file prior to visit.       Review of Systems Constitutional:   No  weight loss, night sweats,  Fevers, chills,  +fatigue, or  lassitude.  HEENT:   No  headaches,  Difficulty swallowing,  Tooth/dental problems, or  Sore throat,                No sneezing, itching, ear ache, nasal congestion, post nasal drip,   CV:  No chest pain,  Orthopnea, PND, swelling in lower extremities, anasarca, dizziness, palpitations, syncope.   GI  No heartburn, indigestion, abdominal pain, nausea, vomiting, diarrhea, change in bowel habits, loss of appetite, bloody stools.   Resp:  .  No chest wall deformity  Skin: no rash or lesions.  GU: no dysuria, change in color of urine, no urgency or frequency.  No flank pain, no hematuria   MS:  No joint pain or swelling.  No decreased range of motion.  No back pain.  Psych:  No change in mood or affect. No depression or anxiety.  No  memory loss.         Objective:   Physical Exam Vitals:   01/28/16 1445  BP: 112/62  Pulse: (!) 55  Temp: 97.4 F (36.3 C)  TempSrc: Oral  SpO2: 94%  Weight: 159 lb (72.1 kg)  Height: 5\' 6"  (1.676 m)   GEN: A/Ox3; pleasant , NAD, elderly    HEENT:  [place]/AT,  EACs-clear, TMs-wnl, NOSE-clear, THROAT-clear, no lesions, no postnasal drip or exudate noted.   NECK:  Supple w/ fair ROM; no JVD; normal carotid impulses w/o bruits; no thyromegaly or nodules palpated; no lymphadenopathy.    RESP  Few trace rhonchi , rattling cough , no accessory muscle use, no dullness to percussion  CARD:  RRR, no m/r/g  , no peripheral edema, pulses intact, no cyanosis or clubbing.  GI:   Soft & nt; nml bowel sounds; no organomegaly or masses detected.   Musco: Warm bil, no deformities or joint swelling noted.   Neuro: alert, no focal deficits noted.    Skin: Warm, no lesions or rashes   Tammy Parrett NP-C  [facility]  01/28/2016

## 2016-02-03 NOTE — Progress Notes (Signed)
Reviewed, agree with this plan of care 

## 2016-02-06 LAB — AFB CULTURE WITH SMEAR (NOT AT ARMC)

## 2016-02-10 ENCOUNTER — Telehealth: Payer: Self-pay | Admitting: Pulmonary Disease

## 2016-02-10 MED ORDER — IPRATROPIUM-ALBUTEROL 0.5-2.5 (3) MG/3ML IN SOLN: 3.0000 mL | Freq: Four times a day (QID) | RESPIRATORY_TRACT | 12 refills | Status: DC | PRN

## 2016-02-10 NOTE — Telephone Encounter (Signed)
Called and spoke to pt's daughter, [name]. [name] stated the pt is needing a refill of duoneb, pharmacy is [place]. Rx printed and placed in BQ's look-at's. Will route to my box to follow as BQ will need to sign rx when in office tomorrow 02/11/2016

## 2016-02-11 MED ORDER — IPRATROPIUM-ALBUTEROL 0.5-2.5 (3) MG/3ML IN SOLN: 3.0000 mL | Freq: Four times a day (QID) | RESPIRATORY_TRACT | 12 refills | Status: DC | PRN

## 2016-02-11 NOTE — Telephone Encounter (Signed)
Spoke with Caryl Pina, rx was faxed to [facility] on 02/10/16. Nothing further needed at this time.

## 2016-02-26 ENCOUNTER — Other Ambulatory Visit: Payer: Self-pay | Admitting: Pulmonary Disease

## 2016-03-04 ENCOUNTER — Other Ambulatory Visit: Payer: Self-pay | Admitting: Adult Health

## 2016-03-04 DIAGNOSIS — I5189 Other ill-defined heart diseases: Secondary | ICD-10-CM

## 2016-03-13 ENCOUNTER — Ambulatory Visit: Payer: Medicare HMO | Admitting: Interventional Cardiology

## 2016-03-31 ENCOUNTER — Encounter: Payer: Self-pay | Admitting: Adult Health

## 2016-03-31 ENCOUNTER — Ambulatory Visit (INDEPENDENT_AMBULATORY_CARE_PROVIDER_SITE_OTHER): Payer: Medicare HMO | Admitting: Adult Health

## 2016-03-31 DIAGNOSIS — J439 Emphysema, unspecified: Secondary | ICD-10-CM | POA: Diagnosis not present

## 2016-03-31 MED ORDER — FLUTTER DEVI: 0 refills | Status: DC

## 2016-03-31 MED ORDER — PREDNISONE 10 MG PO TABS: ORAL_TABLET | ORAL | 0 refills | Status: DC

## 2016-03-31 MED ORDER — AMOXICILLIN-POT CLAVULANATE 875-125 MG PO TABS: 1.0000 | ORAL_TABLET | Freq: Two times a day (BID) | ORAL | 0 refills | Status: AC

## 2016-03-31 NOTE — Patient Instructions (Addendum)
Augmentin 875mg  Twice daily  For 7 days , take w/ food.  Prednisone taper over next week.  Eat yogurt daily .  Begin Probiotic daily  Hold azitrhomycin while on augmentin , once done restart Azithromycin.  Mucinex DM Twice daily  .As needed  Cough/congestion  Continue  on Flutter valve Three times a day   Please contact office for sooner follow up if symptoms do not improve or worsen or seek emergency care  Follow up Dr. Lake Bells in  Next months as planned  and As needed

## 2016-03-31 NOTE — Progress Notes (Signed)
Reviewed, agree with this plan of care 

## 2016-03-31 NOTE — Assessment & Plan Note (Signed)
Recurrent exacerbation   Plan  Patient Instructions  Augmentin 875mg  Twice daily  For 7 days , take w/ food.  Prednisone taper over next week.  Eat yogurt daily .  Begin Probiotic daily  Hold azitrhomycin while on augmentin , once done restart Azithromycin.  Mucinex DM Twice daily  .As needed  Cough/congestion  Continue  on Flutter valve Three times a day   Please contact office for sooner follow up if symptoms do not improve or worsen or seek emergency care  Follow up [name] [name] in  Next months as planned  and As needed

## 2016-03-31 NOTE — Addendum Note (Signed)
Addended by: Osa Craver on: 03/31/2016 10:51 AM   Modules accepted: Orders

## 2016-03-31 NOTE — Progress Notes (Signed)
Subjective:    Patient ID: [name] Seller Sr., male    DOB: 14-Oct-1924, 82 y.o.   MRN: [id number]  HPI 82 year old male former smoker followed for emphysema  Synopsis: former patient of Dr. Gwenette Greet with COPD Smoked cigarettes for many years, from age 14 to age 65 1.5 ppd CXR/CT chest 2014:  Emphysematous changes with mild scarring, +dilated esophagus with A/F level.  PFT"s 08/2012:  Normal FEV1%, but FVC improved 17% with BD.  +++ airtrapping/ +FVL, no restriction, DLCO normal  Echo 09/2012:  Normal LV EF, diastolic dysfxn, mildly dilated LA, RV normal.  CT angio 09/2012:  No PE, small nodule posterior LUL, GG anterior lingula ?significance, scattered areas of atx.  No significant emphysematous changes or ISLD 07/2015 Pseudomonas (pan sensitive )  Sputum cx and AFB neg on 12/23/15 .   03/31/2016 Acute OV  : Emphysema  Patient presents for an acute office visit. Patient complains of 2-3 weeks cough, congestion.  Coughing up thick mucus,. Flutter valve broke, needs new one.  He denies chest pain, orthopnea, edema or fever.  He remains on ANORO daily .Pulmicort neb Twice daily   Has Duoneb As needed   Remains on Azithromycin daily.  Denies hemoptysis, chest pain, orthopnea, PND, or increased leg swelling PVX is utd.  Recent bronchitis tx w/ augmentin by PCP , feeling better.  Continues to use flutter valve. Does not feel it works as well anymore.  Previous sputum cx 07/2015 + psuedomonas, repeat sputum/AFB 12/2015 neg.     Past Medical History:  Diagnosis Date  . Anemia   . COPD (chronic obstructive pulmonary disease) (HCC)    Mild  . HTN (hypertension)   . Hypothyroidism    Current Outpatient Prescriptions on File Prior to Visit  Medication Sig Dispense Refill  . azithromycin (ZITHROMAX) 250 MG tablet TAKE ONE TABLET BY MOUTH ONCE DAILY 30 tablet 3  . budesonide (PULMICORT) 0.25 MG/2ML nebulizer solution Take 2 mLs (0.25 mg total) by nebulization 2 (two) times daily. 120 mL 11  .  Calcium Carbonate-Vitamin D (CALCIUM-VITAMIN D) 500-200 MG-UNIT tablet Take 1 tablet by mouth daily.     Marland Kitchen diltiazem (TIAZAC) 120 MG 24 hr capsule Take 120 mg by mouth daily.    . Ferrous Sulfate (IRON) 28 MG TABS Take 1 tablet by mouth daily.    . finasteride (PROSCAR) 5 MG tablet Take 5 mg by mouth daily.    . furosemide (LASIX) 20 MG tablet Take 20 mg by mouth as needed. Take 1 tablet daily as needed can increase to 2 tabs a day for increased swelling.    Marland Kitchen guaiFENesin (MUCINEX) 600 MG 12 hr tablet Take 600 mg by mouth daily.    Marland Kitchen ipratropium-albuterol (DUONEB) 0.5-2.5 (3) MG/3ML SOLN Take 3 mLs by nebulization every 6 (six) hours as needed (for shortness of breath). 360 mL 12  . levothyroxine (SYNTHROID, LEVOTHROID) 100 MCG tablet Take 100 mcg by mouth daily.    . Multiple Vitamin (MULTIVITAMIN) tablet Take 1 tablet by mouth daily.    Marland Kitchen omeprazole (PRILOSEC) 40 MG capsule Take 40 mg by mouth daily.     . tamsulosin (FLOMAX) 0.4 MG CAPS Take 0.4 mg by mouth daily.    Marland Kitchen Umeclidinium-Vilanterol (ANORO ELLIPTA) 62.5-25 MCG/INH AEPB Inhale 1 puff into the lungs daily. 30 each 12  . Respiratory Therapy Supplies (FLUTTER) DEVI Use as directed (Patient not taking: Reported on 03/31/2016) 1 each 0   No current facility-administered medications on file prior to visit.  Review of Systems Constitutional:   No  weight loss, night sweats,  Fevers, chills,  +fatigue, or  lassitude.  HEENT:   No headaches,  Difficulty swallowing,  Tooth/dental problems, or  Sore throat,                No sneezing, itching, ear ache,  +nasal congestion, post nasal drip,   CV:  No chest pain,  Orthopnea, PND, swelling in lower extremities, anasarca, dizziness, palpitations, syncope.   GI  No heartburn, indigestion, abdominal pain, nausea, vomiting, diarrhea, change in bowel habits, loss of appetite, bloody stools.   Resp:  .  No chest wall deformity  Skin: no rash or lesions.  GU: no dysuria, change in  color of urine, no urgency or frequency.  No flank pain, no hematuria   MS:  No joint pain or swelling.  No decreased range of motion.  No back pain.  Psych:  No change in mood or affect. No depression or anxiety.  No memory loss.         Objective:   Physical Exam Vitals:   03/31/16 1006  BP: 124/62  Pulse: 78  Temp: 97.5 F (36.4 C)  SpO2: 99%  Weight: 154 lb 6.4 oz (70 kg)  Height: 5\' 6"  (1.676 m)   GEN: A/Ox3; pleasant , NAD, elderly    HEENT:  [place]/AT,  EACs-clear, TMs-wnl, NOSE-clear, THROAT-clear, no lesions, no postnasal drip or exudate noted.   NECK:  Supple w/ fair ROM; no JVD; normal carotid impulses w/o bruits; no thyromegaly or nodules palpated; no lymphadenopathy.    RESP  Few trace rhonchi , rattling cough , no accessory muscle use, no dullness to percussion  CARD:  RRR, no m/r/g  , no peripheral edema, pulses intact, no cyanosis or clubbing.  GI:   Soft & nt; nml bowel sounds; no organomegaly or masses detected.   Musco: Warm bil, no deformities or joint swelling noted.   Neuro: alert, no focal deficits noted.    Skin: Warm, no lesions or rashes   Tammy Parrett NP-C  [facility]  03/31/2016

## 2016-04-29 ENCOUNTER — Other Ambulatory Visit (INDEPENDENT_AMBULATORY_CARE_PROVIDER_SITE_OTHER): Payer: Medicare HMO

## 2016-04-29 ENCOUNTER — Encounter: Payer: Self-pay | Admitting: Pulmonary Disease

## 2016-04-29 ENCOUNTER — Ambulatory Visit (INDEPENDENT_AMBULATORY_CARE_PROVIDER_SITE_OTHER): Payer: Medicare HMO | Admitting: Pulmonary Disease

## 2016-04-29 VITALS — BP 132/6 | HR 76 | Ht 66.0 in | Wt 160.0 lb

## 2016-04-29 DIAGNOSIS — Z5181 Encounter for therapeutic drug level monitoring: Secondary | ICD-10-CM

## 2016-04-29 DIAGNOSIS — R053 Chronic cough: Secondary | ICD-10-CM

## 2016-04-29 DIAGNOSIS — J432 Centrilobular emphysema: Secondary | ICD-10-CM

## 2016-04-29 DIAGNOSIS — R05 Cough: Secondary | ICD-10-CM | POA: Diagnosis not present

## 2016-04-29 DIAGNOSIS — I5189 Other ill-defined heart diseases: Secondary | ICD-10-CM

## 2016-04-29 DIAGNOSIS — I519 Heart disease, unspecified: Secondary | ICD-10-CM | POA: Diagnosis not present

## 2016-04-29 LAB — HEPATIC FUNCTION PANEL
ALBUMIN: 3.7 g/dL (ref 3.5–5.2)
ALK PHOS: 49 U/L (ref 39–117)
ALT: 13 U/L (ref 0–53)
AST: 22 U/L (ref 0–37)
Bilirubin, Direct: 0.1 mg/dL (ref 0.0–0.3)
TOTAL PROTEIN: 6.6 g/dL (ref 6.0–8.3)
Total Bilirubin: 0.5 mg/dL (ref 0.2–1.2)

## 2016-04-29 NOTE — Assessment & Plan Note (Signed)
This has been a stable interval for [name] [name] in that he has not had an exacerbation. He continues to be bothered by chronic bronchitis symptoms but in general I think are doing quite well. We talked today about his persisting clear mucus production. This may be related to postnasal drip that he denies significant symptoms in that regard. I also wonder whether or not he may aspirate so I have asked his family to pay attention to see if he coughs more around eating. If he does then week and get a speech therapy evaluation.  Plan: Continue budesonide twice a day Continue flutter valve twice a day Continue Anoro Continue albuterol as needed Use flutter valve twice a day Use Mucinex twice a day Consider speech therapy evaluation Continue azithromycin daily, check liver function testing today to monitor for toxicity

## 2016-04-29 NOTE — Patient Instructions (Signed)
Try taking generic Zyrtec (cetirizine) Use her flutter valve at least twice a day, 10 breath at a time to try to clear out as much mucus is possible Keep taking your medicines as you're doing We will see you back in 3 months or sooner if needed

## 2016-04-29 NOTE — Assessment & Plan Note (Signed)
Weight is stable, minimal swelling. Continue Lasix as ordered.

## 2016-04-29 NOTE — Progress Notes (Signed)
Subjective:    Patient ID: [name] Seller Sr., male    DOB: 08/20/1924, 82 y.o.   MRN: [id number]  Synopsis: former patient of Dr. Gwenette Greet with COPD Smoked cigarettes for many years, from age 63 to age 84 1.5 ppd CXR/CT chest 2014:  Emphysematous changes with mild scarring, +dilated esophagus with A/F level.  PFT"s 08/2012:  Normal FEV1%, but FVC improved 17% with BD.  +++ airtrapping/ +FVL, no restriction, DLCO normal  Echo 09/2012:  Normal LV EF, diastolic dysfxn, mildly dilated LA, RV normal.  CT angio 09/2012:  No PE, small nodule posterior LUL, GG anterior lingula ?significance, scattered areas of atx.  No significant emphysematous changes or ISLD  HPI Chief Complaint  Patient presents with  . Follow-up    pt c/o worsening prod cough with clear mucus.  denies sinus congestion, chest pain, SOB.    [name] [name] says he still has a bad cough: > clear mucus > always brings up mucus just about every time he coughs > sometimes the mucus is grey > cough not necessarily worse with eating or lying flat  Not having any bronchitis in a few weeks.  No dyspnea right now.  Still using his nebulizer twice a day (budesonide) and he does albuterol once per day.  He still takes Anoro daily.    He continues to take Azithromycin daily.    No swelling lately.   He is not taking mucinex right now.  But he is using something called alka-seltzer for chest congestion, it helped a little bit.    Past Medical History:  Diagnosis Date  . Anemia   . COPD (chronic obstructive pulmonary disease) (HCC)    Mild  . HTN (hypertension)   . Hypothyroidism       Review of Systems  Constitutional: Negative for chills, fatigue and fever.  HENT: Negative for postnasal drip, rhinorrhea and sinus pressure.   Respiratory: Negative for cough, shortness of breath and wheezing.   Cardiovascular: Positive for leg swelling. Negative for chest pain and palpitations.       Objective:   Physical Exam  Vitals:    04/29/16 0856  BP: (!) 132/6  Pulse: 76  SpO2: 93%  Weight: 160 lb (72.6 kg)  Height: 5\' 6"  (1.676 m)   RA  Gen: well appearing HENT: OP clear, TM's clear, neck supple PULM: Wheezing on exam, normal effort CV: RRR, no mgr, significant leg edema, increased JVD GI: BS+, soft, nontender Derm: no cyanosis or rash Psyche: normal mood and affect   BMET    Component Value Date/Time   NA 137 09/13/2015 1439   K 3.9 09/13/2015 1439   CL 103 09/13/2015 1439   CO2 27 09/13/2015 1439   GLUCOSE 96 09/13/2015 1439   BUN 20 09/13/2015 1439   CREATININE 1.00 09/13/2015 1439   CREATININE 1.27 10/24/2012 1006   CALCIUM 8.9 09/13/2015 1439   GFRNONAA 63 (L) 10/10/2012 0440   GFRAA 73 (L) 10/10/2012 0440   Records from his last visit here reviewed where he was asked to use Mucinex. No prednisone needed at that time.  07/2015 Sputum culture> pseudomonas     Assessment & Plan:  COPD (chronic obstructive pulmonary disease) with emphysema ([place]) This has been a stable interval for [name] [name] in that he has not had an exacerbation. He continues to be bothered by chronic bronchitis symptoms but in general I think are doing quite well. We talked today about his persisting clear mucus production. This may be related  to postnasal drip that he denies significant symptoms in that regard. I also wonder whether or not he may aspirate so I have asked his family to pay attention to see if he coughs more around eating. If he does then week and get a speech therapy evaluation.  Plan: Continue budesonide twice a day Continue flutter valve twice a day Continue Anoro Continue albuterol as needed Use flutter valve twice a day Use Mucinex twice a day Consider speech therapy evaluation Continue azithromycin daily, check liver function testing today to monitor for toxicity  Chronic cough Add Zyrtec, see details above  Diastolic dysfunction Weight is stable, minimal swelling. Continue Lasix as  ordered.    Current Outpatient Prescriptions:  .  azithromycin (ZITHROMAX) 250 MG tablet, TAKE ONE TABLET BY MOUTH ONCE DAILY, Disp: 30 tablet, Rfl: 3 .  budesonide (PULMICORT) 0.25 MG/2ML nebulizer solution, Take 2 mLs (0.25 mg total) by nebulization 2 (two) times daily., Disp: 120 mL, Rfl: 11 .  Calcium Carbonate-Vitamin D (CALCIUM-VITAMIN D) 500-200 MG-UNIT tablet, Take 1 tablet by mouth daily. , Disp: , Rfl:  .  diltiazem (TIAZAC) 120 MG 24 hr capsule, Take 120 mg by mouth daily., Disp: , Rfl:  .  Ferrous Sulfate (IRON) 28 MG TABS, Take 1 tablet by mouth daily., Disp: , Rfl:  .  finasteride (PROSCAR) 5 MG tablet, Take 5 mg by mouth daily., Disp: , Rfl:  .  furosemide (LASIX) 20 MG tablet, Take 20 mg by mouth as needed. Take 1 tablet daily as needed can increase to 2 tabs a day for increased swelling., Disp: , Rfl:  .  guaiFENesin (MUCINEX) 600 MG 12 hr tablet, Take 600 mg by mouth daily., Disp: , Rfl:  .  ipratropium-albuterol (DUONEB) 0.5-2.5 (3) MG/3ML SOLN, Take 3 mLs by nebulization every 6 (six) hours as needed (for shortness of breath)., Disp: 360 mL, Rfl: 12 .  levothyroxine (SYNTHROID, LEVOTHROID) 100 MCG tablet, Take 100 mcg by mouth daily., Disp: , Rfl:  .  Multiple Vitamin (MULTIVITAMIN) tablet, Take 1 tablet by mouth daily., Disp: , Rfl:  .  omeprazole (PRILOSEC) 40 MG capsule, Take 40 mg by mouth daily. , Disp: , Rfl:  .  Respiratory Therapy Supplies (FLUTTER) DEVI, Use as directed, Disp: 1 each, Rfl: 0 .  tamsulosin (FLOMAX) 0.4 MG CAPS, Take 0.4 mg by mouth daily., Disp: , Rfl:  .  Umeclidinium-Vilanterol (ANORO ELLIPTA) 62.5-25 MCG/INH AEPB, Inhale 1 puff into the lungs daily., Disp: 30 each, Rfl: 12

## 2016-04-29 NOTE — Assessment & Plan Note (Signed)
Add Zyrtec, see details above

## 2016-07-07 ENCOUNTER — Other Ambulatory Visit: Payer: Self-pay | Admitting: Pulmonary Disease

## 2016-07-07 ENCOUNTER — Telehealth: Payer: Self-pay | Admitting: Pulmonary Disease

## 2016-07-07 NOTE — Telephone Encounter (Signed)
Spoke with pt's caregiver, [name]. Advised her that this prescription was sent to the pharmacy at 11:30am today. She was very [employer] attorney. Nothing further was needed.

## 2016-07-31 ENCOUNTER — Encounter: Payer: Self-pay | Admitting: Pulmonary Disease

## 2016-07-31 ENCOUNTER — Ambulatory Visit (INDEPENDENT_AMBULATORY_CARE_PROVIDER_SITE_OTHER)
Admission: RE | Admit: 2016-07-31 | Discharge: 2016-07-31 | Disposition: A | Discharge status: Home / Self Care | Payer: Medicare HMO | Source: Ambulatory Visit | Attending: Pulmonary Disease | Admitting: Pulmonary Disease

## 2016-07-31 ENCOUNTER — Ambulatory Visit (INDEPENDENT_AMBULATORY_CARE_PROVIDER_SITE_OTHER): Payer: Medicare HMO | Admitting: Pulmonary Disease

## 2016-07-31 VITALS — BP 126/68 | HR 78 | Ht 66.0 in | Wt 156.0 lb

## 2016-07-31 DIAGNOSIS — Z5181 Encounter for therapeutic drug level monitoring: Secondary | ICD-10-CM | POA: Diagnosis not present

## 2016-07-31 DIAGNOSIS — J432 Centrilobular emphysema: Secondary | ICD-10-CM | POA: Diagnosis not present

## 2016-07-31 MED ORDER — PREDNISONE 20 MG PO TABS: 20.0000 mg | ORAL_TABLET | Freq: Every day | ORAL | 0 refills | Status: DC

## 2016-07-31 MED ORDER — FLUTICASONE-UMECLIDIN-VILANT 100-62.5-25 MCG/INH IN AEPB: 1.0000 | INHALATION_SPRAY | Freq: Every day | RESPIRATORY_TRACT | 11 refills | Status: DC

## 2016-07-31 NOTE — Addendum Note (Signed)
Addended by: Len Blalock on: 07/31/2016 02:06 PM   Modules accepted: Orders

## 2016-07-31 NOTE — Progress Notes (Signed)
Subjective:    Patient ID: [name] Seller Sr., male    DOB: 1924/07/04, 83 y.o.   MRN: [id number]  Synopsis: former patient of Dr. Gwenette Greet with COPD Smoked cigarettes for many years, from age 63 to age 36 1.5 ppd CXR/CT chest 2014:  Emphysematous changes with mild scarring, +dilated esophagus with A/F level.  PFT"s 08/2012:  Normal FEV1%, but FVC improved 17% with BD.  +++ airtrapping/ +FVL, no restriction, DLCO normal  Echo 09/2012:  Normal LV EF, diastolic dysfxn, mildly dilated LA, RV normal.  CT angio 09/2012:  No PE, small nodule posterior LUL, GG anterior lingula ?significance, scattered areas of atx.  No significant emphysematous changes or ISLD  HPI Chief Complaint  Patient presents with  . Follow-up    pt c/o prod cough with gray mucus.  denies sinus congestion, chest tightness.     [name] [name] has had some trouble with appetite recently and has struggled with cough with chest congestion. He is taking albuterol once a day on a scheduled basis, not as needed. He continues to take azithromycin daily. He had all his chest symptoms since thanksgiving. He had levaquin back in 05/2017 for a URI, he needed it twice during the month.  He also needed a temporary increase in his lasix dosing. He still has some ankle swelling but his weight is down.    Past Medical History:  Diagnosis Date  . Anemia   . COPD (chronic obstructive pulmonary disease) (HCC)    Mild  . HTN (hypertension)   . Hypothyroidism       Review of Systems  Constitutional: Negative for chills, fatigue and fever.  HENT: Negative for postnasal drip, rhinorrhea and sinus pressure.   Respiratory: Negative for cough, shortness of breath and wheezing.   Cardiovascular: Positive for leg swelling. Negative for chest pain and palpitations.       Objective:   Physical Exam  Vitals:   07/31/16 1329  BP: 126/68  Pulse: 78  SpO2: 94%  Weight: 156 lb (70.8 kg)  Height: 5\' 6"  (1.676 m)   RA  Gen: chronically ill  appearing HENT: OP clear, TM's clear, neck supple PULM: Wheezing bilaterally, crackles bases, normal percussion CV: RRR, no mgr, some ankle edema GI: BS+, soft, nontender Derm: no cyanosis or rash Psyche: normal mood and affect   BMET    Component Value Date/Time   NA 137 09/13/2015 1439   K 3.9 09/13/2015 1439   CL 103 09/13/2015 1439   CO2 27 09/13/2015 1439   GLUCOSE 96 09/13/2015 1439   BUN 20 09/13/2015 1439   CREATININE 1.00 09/13/2015 1439   CREATININE 1.27 10/24/2012 1006   CALCIUM 8.9 09/13/2015 1439   GFRNONAA 63 (L) 10/10/2012 0440   GFRAA 73 (L) 10/10/2012 0440   Records from his visit with his urgent care physician and primary care physician reviewed. In December he had Levaquin prescribed for a upper respiratory infection and his diuretic dosing was increased.     Assessment & Plan:  COPD (chronic obstructive pulmonary disease) with emphysema ([place]) Unfortunately he had a flare up of his COPD in December. However, this has been a relatively stable interval for him since starting daily azithromycin.  He was recently prescribed a combination inhaler which is a reasonable option for him in terms of ease of use but it's not clear to me that this will be covered by Medicare.  Plan: Continue Trelegy for now Hold Anoro and Pulmicort for now Use albuterol as needed Today we  talked about the importance of having an action plan for his flares of COPD: I told him to use albuterol more frequently and call me when he has symptoms.Take the prednisone as prescribed We will call you with the results of the chest x-ray  Take Trelegy one puff daily no matter how you feel When you are on Trelegy, don't take Anoro or pulmicort Use albuterol as needed for chest tightness, wheezing or shortness of breath  In the event of a flare up of your symptoms: Use albuterol 3 times a day Call us and let us know so week and call in a prescription  We will see you back in 2-3 months or  sooner if needed   > 15 minutes spent face to face, 30 minutes total   Current Outpatient Prescriptions:  .  azithromycin (ZITHROMAX) 250 MG tablet, TAKE ONE TABLET BY MOUTH ONCE DAILY, Disp: 30 tablet, Rfl: 3 .  budesonide (PULMICORT) 0.25 MG/2ML nebulizer solution, Take 2 mLs (0.25 mg total) by nebulization 2 (two) times daily., Disp: 120 mL, Rfl: 11 .  Calcium Carbonate-Vitamin D (CALCIUM-VITAMIN D) 500-200 MG-UNIT tablet, Take 1 tablet by mouth daily. , Disp: , Rfl:  .  diltiazem (TIAZAC) 120 MG 24 hr capsule, Take 120 mg by mouth daily., Disp: , Rfl:  .  Ferrous Sulfate (IRON) 28 MG TABS, Take 1 tablet by mouth daily., Disp: , Rfl:  .  finasteride (PROSCAR) 5 MG tablet, Take 5 mg by mouth daily., Disp: , Rfl:  .  furosemide (LASIX) 20 MG tablet, Take 20 mg by mouth as needed. Take 1 tablet daily as needed can increase to 2 tabs a day for increased swelling., Disp: , Rfl:  .  guaiFENesin (MUCINEX) 600 MG 12 hr tablet, Take 600 mg by mouth daily., Disp: , Rfl:  .  ipratropium-albuterol (DUONEB) 0.5-2.5 (3) MG/3ML SOLN, Take 3 mLs by nebulization every 6 (six) hours as needed (for shortness of breath)., Disp: 360 mL, Rfl: 12 .  levothyroxine (SYNTHROID, LEVOTHROID) 100 MCG tablet, Take 100 mcg by mouth daily., Disp: , Rfl:  .  Multiple Vitamin (MULTIVITAMIN) tablet, Take 1 tablet by mouth daily., Disp: , Rfl:  .  omeprazole (PRILOSEC) 40 MG capsule, Take 40 mg by mouth daily. , Disp: , Rfl:  .  Respiratory Therapy Supplies (FLUTTER) DEVI, Use as directed, Disp: 1 each, Rfl: 0 .  tamsulosin (FLOMAX) 0.4 MG CAPS, Take 0.4 mg by mouth daily., Disp: , Rfl:  .  Umeclidinium-Vilanterol (ANORO ELLIPTA) 62.5-25 MCG/INH AEPB, Inhale 1 puff into the lungs daily., Disp: 30 each, Rfl: 12 .  Fluticasone-Umeclidin-Vilant (TRELEGY ELLIPTA) 100-62.5-25 MCG/INH AEPB, Inhale 1 puff into the lungs daily., Disp: 60 each, Rfl: 11 .  predniSONE (DELTASONE) 20 MG tablet, Take 1 tablet (20 mg total) by mouth daily  with breakfast., Disp: 5 tablet, Rfl: 0

## 2016-07-31 NOTE — Assessment & Plan Note (Signed)
Unfortunately he had a flare up of his COPD in December. However, this has been a relatively stable interval for him since starting daily azithromycin.  He was recently prescribed a combination inhaler which is a reasonable option for him in terms of ease of use but it's not clear to me that this will be covered by Medicare.  Plan: Continue Trelegy for now Hold Anoro and Pulmicort for now Use albuterol as needed Today we talked about the importance of having an action plan for his flares of COPD: I told him to use albuterol more frequently and call me when he has symptoms.Take the prednisone as prescribed We will call you with the results of the chest x-ray  Take Trelegy one puff daily no matter how you feel When you are on Trelegy, don't take Anoro or pulmicort Use albuterol as needed for chest tightness, wheezing or shortness of breath  In the event of a flare up of your symptoms: Use albuterol 3 times a day Call us and let us know so week and call in a prescription  We will see you back in 2-3 months or sooner if needed

## 2016-07-31 NOTE — Patient Instructions (Signed)
Take the prednisone as prescribed We will call you with the results of the chest x-ray  Take Trelegy one puff daily no matter how you feel When you are on Trelegy, don't take Anoro or pulmicort Use albuterol as needed for chest tightness, wheezing or shortness of breath  In the event of a flare up of your symptoms: Use albuterol 3 times a day Call us and let us know so week and call in a prescription  We will see you back in 2-3 months or sooner if needed

## 2016-08-03 NOTE — Progress Notes (Signed)
Spoke with patient and informed him of results. Pt did not have any additional questions. Nothing further is needed.

## 2016-09-07 ENCOUNTER — Other Ambulatory Visit: Payer: Self-pay

## 2016-09-25 ENCOUNTER — Telehealth: Payer: Self-pay | Admitting: Pulmonary Disease

## 2016-09-25 MED ORDER — PREDNISONE 20 MG PO TABS: ORAL_TABLET | ORAL | 0 refills | Status: DC

## 2016-09-25 NOTE — Telephone Encounter (Signed)
BQ  Please Advise-Sick Message  Pt's granddaughter called in on behalf of the pt, requesting a short course of prednisone to be called in for the pt. He is c/o mild sob,productive cough with thick clear to yellowish mucous. He is using his flutter valve three times a day. He is using his nebulizer two times a day. He is not having any leg or feet swelling, using his lasix as needed.

## 2016-09-25 NOTE — Telephone Encounter (Signed)
Spoke with pts granddaughter and she is aware of rx that has been sent to the pharmacy.

## 2016-09-25 NOTE — Telephone Encounter (Signed)
Prednisone 40 mg daily x 5 days

## 2016-10-26 ENCOUNTER — Other Ambulatory Visit: Payer: Self-pay | Admitting: Pulmonary Disease

## 2016-10-26 NOTE — Telephone Encounter (Signed)
LMOMTCB x 1 

## 2016-10-26 NOTE — Telephone Encounter (Signed)
[name] [name] returned call.  Jess asked I just get pharmacy to send Pulmicort to.  Per Ms. Fernande Boyden, send this to [facility] on [address] in [place].  Ms. Elson Clan call back # is [phone] if need to reach her.

## 2016-10-26 NOTE — Telephone Encounter (Signed)
Request medication to be sent to [facility].Marland Kitchenert

## 2016-10-27 ENCOUNTER — Telehealth: Payer: Self-pay | Admitting: Pulmonary Disease

## 2016-10-27 ENCOUNTER — Other Ambulatory Visit: Payer: Self-pay

## 2016-10-27 MED ORDER — IPRATROPIUM-ALBUTEROL 0.5-2.5 (3) MG/3ML IN SOLN: 3.0000 mL | Freq: Four times a day (QID) | RESPIRATORY_TRACT | 3 refills | Status: DC | PRN

## 2016-10-27 MED ORDER — IPRATROPIUM-ALBUTEROL 0.5-2.5 (3) MG/3ML IN SOLN: 3.0000 mL | Freq: Four times a day (QID) | RESPIRATORY_TRACT | 11 refills | Status: DC | PRN

## 2016-10-27 NOTE — Telephone Encounter (Signed)
Spoke with pt's granddaughter Tammy (dpr on file), aware of refill.  rx sent to preferred pharmacy.  Nothing further needed.

## 2016-10-27 NOTE — Telephone Encounter (Signed)
LMTCB- is she still on trelegy?

## 2016-10-27 NOTE — Telephone Encounter (Signed)
Pt's granddaughter Tammy contacting [name] Per the 2.16.18 ov w/ BQ:  Plan: Continue Trelegy for now Hold Anoro and Pulmicort for now Use albuterol as needed Today we talked about the importance of having an action plan for his flares of COPD: I told him to use albuterol more frequently and call me when he has symptoms.Take the prednisone as prescribed We will call you with the results of the chest x-ray   Take Trelegy one puff daily no matter how you feel When you are on Trelegy, don't take Anoro or pulmicort Use albuterol as needed for chest tightness, wheezing or shortness of breath   In the event of a flare up of your symptoms: Use albuterol 3 times a day Call us and let us know so week and call in a prescription   We will see you back in 2-3 months or sooner if needed

## 2016-10-28 NOTE — Telephone Encounter (Signed)
This was taken care of in the 5.15.18 phone note Will sign off

## 2016-11-01 ENCOUNTER — Other Ambulatory Visit: Payer: Self-pay | Admitting: Pulmonary Disease

## 2016-11-06 ENCOUNTER — Ambulatory Visit (INDEPENDENT_AMBULATORY_CARE_PROVIDER_SITE_OTHER): Payer: Medicare HMO | Admitting: Pulmonary Disease

## 2016-11-06 ENCOUNTER — Other Ambulatory Visit (INDEPENDENT_AMBULATORY_CARE_PROVIDER_SITE_OTHER): Payer: Medicare HMO

## 2016-11-06 ENCOUNTER — Encounter: Payer: Self-pay | Admitting: Pulmonary Disease

## 2016-11-06 VITALS — BP 130/62 | HR 109 | Ht 66.0 in | Wt 151.8 lb

## 2016-11-06 DIAGNOSIS — M7989 Other specified soft tissue disorders: Secondary | ICD-10-CM | POA: Diagnosis not present

## 2016-11-06 DIAGNOSIS — J9611 Chronic respiratory failure with hypoxia: Secondary | ICD-10-CM | POA: Diagnosis not present

## 2016-11-06 DIAGNOSIS — J432 Centrilobular emphysema: Secondary | ICD-10-CM | POA: Diagnosis not present

## 2016-11-06 DIAGNOSIS — I519 Heart disease, unspecified: Secondary | ICD-10-CM

## 2016-11-06 DIAGNOSIS — R05 Cough: Secondary | ICD-10-CM | POA: Diagnosis not present

## 2016-11-06 DIAGNOSIS — R531 Weakness: Secondary | ICD-10-CM | POA: Diagnosis not present

## 2016-11-06 DIAGNOSIS — R053 Chronic cough: Secondary | ICD-10-CM

## 2016-11-06 DIAGNOSIS — I5189 Other ill-defined heart diseases: Secondary | ICD-10-CM

## 2016-11-06 DIAGNOSIS — D508 Other iron deficiency anemias: Secondary | ICD-10-CM

## 2016-11-06 LAB — CBC WITH DIFFERENTIAL/PLATELET
Basophils Absolute: 0.1 10*3/uL (ref 0.0–0.1)
Basophils Relative: 0.8 % (ref 0.0–3.0)
EOS PCT: 4.1 % (ref 0.0–5.0)
Eosinophils Absolute: 0.3 10*3/uL (ref 0.0–0.7)
HCT: 34.4 % — ABNORMAL LOW (ref 39.0–52.0)
HEMOGLOBIN: 11.5 g/dL — AB (ref 13.0–17.0)
Lymphocytes Relative: 20.5 % (ref 12.0–46.0)
Lymphs Abs: 1.6 10*3/uL (ref 0.7–4.0)
MCHC: 33.3 g/dL (ref 30.0–36.0)
MCV: 96 fl (ref 78.0–100.0)
MONOS PCT: 6.4 % (ref 3.0–12.0)
Monocytes Absolute: 0.5 10*3/uL (ref 0.1–1.0)
Neutro Abs: 5.3 10*3/uL (ref 1.4–7.7)
Neutrophils Relative %: 68.2 % (ref 43.0–77.0)
Platelets: 140 10*3/uL — ABNORMAL LOW (ref 150.0–400.0)
RBC: 3.59 Mil/uL — AB (ref 4.22–5.81)
RDW: 13.7 % (ref 11.5–15.5)
WBC: 7.8 10*3/uL (ref 4.0–10.5)

## 2016-11-06 LAB — COMPREHENSIVE METABOLIC PANEL
ALBUMIN: 3.7 g/dL (ref 3.5–5.2)
ALK PHOS: 57 U/L (ref 39–117)
ALT: 12 U/L (ref 0–53)
AST: 21 U/L (ref 0–37)
BUN: 24 mg/dL — ABNORMAL HIGH (ref 6–23)
CALCIUM: 8.9 mg/dL (ref 8.4–10.5)
CO2: 26 mEq/L (ref 19–32)
Chloride: 105 mEq/L (ref 96–112)
Creatinine, Ser: 1.14 mg/dL (ref 0.40–1.50)
GFR: 64.23 mL/min (ref >60.00)
Glucose, Bld: 125 mg/dL — ABNORMAL HIGH (ref 70–99)
POTASSIUM: 3.7 meq/L (ref 3.5–5.1)
Sodium: 139 mEq/L (ref 135–145)
TOTAL PROTEIN: 6.9 g/dL (ref 6.0–8.3)
Total Bilirubin: 0.4 mg/dL (ref 0.2–1.2)

## 2016-11-06 MED ORDER — SODIUM CHLORIDE 3 % IN NEBU: INHALATION_SOLUTION | Freq: Two times a day (BID) | RESPIRATORY_TRACT | 11 refills | Status: DC

## 2016-11-06 NOTE — Assessment & Plan Note (Signed)
As above.

## 2016-11-06 NOTE — Assessment & Plan Note (Signed)
His leg swelling is increased but his weight is down. Makes me concerned that his albumin may be low. Given his increasing shortness of breath and leg swelling when needed check brain natruretic peptide in addition to an albumin. Continue diuretics therapy as prescribed. Check basic metabolic panel.

## 2016-11-06 NOTE — Assessment & Plan Note (Signed)
He has severe disease in that he has recurrent exacerbations. His primary complaint is persistent chest congestion and he has rhonchi again on exam today. I think a significant portion of this may be due to chronic aspiration but he denies any sort of dysphasia symptoms.  I am pleased by the fact that he has not had an exacerbation since we started him on the most recent combination inhaler Trelegy.    A last resort way to manage his symptoms would be to put him on daily prednisone but I fear that the side effects of that would be to severe.  Plan: Continue mucociliary clearance measures with albuterol and flutter valve twice a day, add hypertonic saline to use as needed Continue Trelegy  Follow-up 2 months

## 2016-11-06 NOTE — Progress Notes (Signed)
Subjective:    Patient ID: [name] Seller Sr., male    DOB: 10/05/1924, 83 y.o.   MRN: [id number]  Synopsis: former patient of Dr. Gwenette Greet with COPD Smoked cigarettes for many years, from age 83 to age 61 1.5 ppd CXR/CT chest 2014:  Emphysematous changes with mild scarring, +dilated esophagus with A/F level.  PFT"s 08/2012:  Normal FEV1%, but FVC improved 17% with BD.  +++ airtrapping/ +FVL, no restriction, DLCO normal  Echo 09/2012:  Normal LV EF, diastolic dysfxn, mildly dilated LA, RV normal.  CT angio 09/2012:  No PE, small nodule posterior LUL, GG anterior lingula ?significance, scattered areas of atx.  No significant emphysematous changes or ISLD  HPI Chief Complaint  Patient presents with  . Follow-up    pt c/o prod cough with gray mucus, sob with exertion.     [name] [name] says that he started the Trelegy after the last visit.  He says that he can't tell a big difference in his cough and chest conestion.  He continues to produce grey mucus which may been a little worse.    We needed to call in some prednisone earlier in the spring.    He has not needed to go to his PCP for a flare up or bronchitis since the last visit.   He has worsenign cough when he lies back in his recliner.  He can't lie flat.  Food doesn't get get.   His leg sewlling is worse but his weight is down.  He is having more leg weakness when he walks.  Past Medical History:  Diagnosis Date  . Anemia   . COPD (chronic obstructive pulmonary disease) (HCC)    Mild  . HTN (hypertension)   . Hypothyroidism       Review of Systems  Constitutional: Negative for chills, fatigue and fever.  HENT: Negative for postnasal drip, rhinorrhea and sinus pressure.   Respiratory: Negative for cough, shortness of breath and wheezing.   Cardiovascular: Positive for leg swelling. Negative for chest pain and palpitations.       Objective:   Physical Exam  Vitals:   11/06/16 1357  BP: 130/62  Pulse: (!) 109  SpO2: 95%    Weight: 151 lb 12.8 oz (68.9 kg)  Height: 5\' 6"  (1.676 m)   RA  Gen: chronically ill appearing HENT: OP clear, TM's clear, neck supple PULM: Rhonchi bilaterally B, normal percussion CV: RRR, no mgr, notable leg edema GI: BS+, soft, nontender Derm: no cyanosis or rash Psyche: normal mood and affect    BMET    Component Value Date/Time   NA 137 09/13/2015 1439   K 3.9 09/13/2015 1439   CL 103 09/13/2015 1439   CO2 27 09/13/2015 1439   GLUCOSE 96 09/13/2015 1439   BUN 20 09/13/2015 1439   CREATININE 1.00 09/13/2015 1439   CREATININE 1.27 10/24/2012 1006   CALCIUM 8.9 09/13/2015 1439   GFRNONAA 63 (L) 10/10/2012 0440   GFRAA 73 (L) 10/10/2012 0440       Assessment & Plan:  Diastolic dysfunction His leg swelling is increased but his weight is down. Makes me concerned that his albumin may be low. Given his increasing shortness of breath and leg swelling when needed check brain natruretic peptide in addition to an albumin. Continue diuretics therapy as prescribed. Check basic metabolic panel.  COPD (chronic obstructive pulmonary disease) with emphysema ([place]) He has severe disease in that he has recurrent exacerbations. His primary complaint is persistent chest congestion and he  has rhonchi again on exam today. I think a significant portion of this may be due to chronic aspiration but he denies any sort of dysphasia symptoms.  I am pleased by the fact that he has not had an exacerbation since we started him on the most recent combination inhaler Trelegy.    A last resort way to manage his symptoms would be to put him on daily prednisone but I fear that the side effects of that would be to severe.  Plan: Continue mucociliary clearance measures with albuterol and flutter valve twice a day, add hypertonic saline to use as needed Continue Trelegy  Follow-up 2 months  Chronic respiratory failure (HCC) If no improvement in symptoms after adding back hypertonic saline consider  repeat CT chest versus barium swallow.  Chronic cough As above  Anemia, iron deficiency Considering worsening weakness we'll check CBC today to ensure he is not anemic.    Current Outpatient Prescriptions:  .  albuterol (PROVENTIL) (5 MG/ML) 0.5% nebulizer solution, Take 2.5 mg by nebulization every 6 (six) hours as needed for wheezing or shortness of breath., Disp: , Rfl:  .  azithromycin (ZITHROMAX) 250 MG tablet, TAKE ONE TABLET BY MOUTH ONCE DAILY, Disp: 30 tablet, Rfl: 3 .  Calcium Carbonate-Vitamin D (CALCIUM-VITAMIN D) 500-200 MG-UNIT tablet, Take 1 tablet by mouth daily. , Disp: , Rfl:  .  diltiazem (TIAZAC) 120 MG 24 hr capsule, Take 120 mg by mouth daily., Disp: , Rfl:  .  Ferrous Sulfate (IRON) 28 MG TABS, Take 1 tablet by mouth daily., Disp: , Rfl:  .  finasteride (PROSCAR) 5 MG tablet, Take 5 mg by mouth daily., Disp: , Rfl:  .  Fluticasone-Umeclidin-Vilant (TRELEGY ELLIPTA) 100-62.5-25 MCG/INH AEPB, Inhale 1 puff into the lungs daily., Disp: 60 each, Rfl: 11 .  furosemide (LASIX) 20 MG tablet, Take 20 mg by mouth as needed. Take 1 tablet daily as needed can increase to 2 tabs a day for increased swelling., Disp: , Rfl:  .  guaiFENesin (MUCINEX) 600 MG 12 hr tablet, Take 600 mg by mouth daily., Disp: , Rfl:  .  levothyroxine (SYNTHROID, LEVOTHROID) 100 MCG tablet, Take 100 mcg by mouth daily., Disp: , Rfl:  .  Multiple Vitamin (MULTIVITAMIN) tablet, Take 1 tablet by mouth daily., Disp: , Rfl:  .  omeprazole (PRILOSEC) 40 MG capsule, Take 40 mg by mouth daily. , Disp: , Rfl:  .  predniSONE (DELTASONE) 20 MG tablet, Take 1 tablet (20 mg total) by mouth daily with breakfast., Disp: 5 tablet, Rfl: 0 .  Respiratory Therapy Supplies (FLUTTER) DEVI, Use as directed, Disp: 1 each, Rfl: 0 .  tamsulosin (FLOMAX) 0.4 MG CAPS, Take 0.4 mg by mouth daily., Disp: , Rfl:  .  sodium chloride HYPERTONIC 3 % nebulizer solution, Take by nebulization 2 (two) times daily., Disp: 750 mL, Rfl: 11

## 2016-11-06 NOTE — Patient Instructions (Signed)
We will call you with the results of the blood tests Take the saltwater solution nebulizer twice a day as needed for chest congestion or shortness of breath Keep using the flutter valve twice a day Keep using albuterol twice a day Take Trelegy once per day We will see you back in 2 months or sooner if needed

## 2016-11-06 NOTE — Assessment & Plan Note (Signed)
Considering worsening weakness we'll check CBC today to ensure he is not anemic.

## 2016-11-06 NOTE — Assessment & Plan Note (Signed)
If no improvement in symptoms after adding back hypertonic saline consider repeat CT chest versus barium swallow.

## 2016-11-11 ENCOUNTER — Telehealth: Payer: Self-pay | Admitting: Pulmonary Disease

## 2016-11-11 NOTE — Telephone Encounter (Signed)
  Notes recorded by Juanito Doom, MD on 11/09/2016 at 12:58 AM EDT A, Please let them know he is more anemic and this may explain his weakness. He needs to make sure he is taking his iron and get back in to see his PCP about this. Thanks, [place]ny [place] with pt's daughter regarding lab results. She verbalized understanding.   Daughter also stated that the sodium chloride neb solution is not covered by his insurance. Called [facility] to check on status of this, they will fax over a rejection letter. Will await fax.

## 2016-11-13 ENCOUNTER — Other Ambulatory Visit: Payer: Self-pay

## 2016-11-13 MED ORDER — SODIUM CHLORIDE 3 % IN NEBU: INHALATION_SOLUTION | Freq: Two times a day (BID) | RESPIRATORY_TRACT | 11 refills | Status: DC

## 2016-11-13 NOTE — Telephone Encounter (Signed)
Caryl Pina, C, resent pt's rx for neb solution as it did not contain a dx code. [place] and was advised the medication is not covered at the pharmacy but may be covered at a DME company. Will speak with pt's granddaughter, Tammy D, and see if this is something they wants to do.

## 2016-11-16 NOTE — Telephone Encounter (Signed)
Will route to myself to follow up on. 

## 2016-11-18 MED ORDER — SODIUM CHLORIDE 3 % IN NEBU: INHALATION_SOLUTION | Freq: Two times a day (BID) | RESPIRATORY_TRACT | 11 refills | Status: DC

## 2016-11-18 NOTE — Telephone Encounter (Signed)
Will route to myself to follow up on with TD

## 2016-11-18 NOTE — Telephone Encounter (Signed)
Spoke with  TD and she stated to go ahead and send over the rx for the neb meds to [place] and she or one of the family would go and pick this up when ready.  rx has been sent and I will call tomorrow to follow up on rx.

## 2016-11-19 NOTE — Telephone Encounter (Signed)
Called and spoke with the pharmacy and they stated that the medication is not covered by his insurance but they can order it for him.  It would be 35.09 for a month supply or 90+ for a 90 day supply.  I will speak with TD about this and see which one she wants to order.

## 2016-11-19 NOTE — Telephone Encounter (Signed)
Spoke with pt's granddaughter, aware of price and will pay for supply month by month.  Will try this X2 months until pt returns for ROV and discuss tx at that time.  Nothing further needed at this time.

## 2017-01-05 ENCOUNTER — Encounter: Payer: Self-pay | Admitting: Pulmonary Disease

## 2017-01-05 DIAGNOSIS — M8580 Other specified disorders of bone density and structure, unspecified site: Secondary | ICD-10-CM | POA: Insufficient documentation

## 2017-01-07 ENCOUNTER — Encounter: Payer: Self-pay | Admitting: Pulmonary Disease

## 2017-01-07 ENCOUNTER — Other Ambulatory Visit (INDEPENDENT_AMBULATORY_CARE_PROVIDER_SITE_OTHER): Payer: Medicare HMO

## 2017-01-07 ENCOUNTER — Ambulatory Visit (INDEPENDENT_AMBULATORY_CARE_PROVIDER_SITE_OTHER): Payer: Medicare HMO | Admitting: Pulmonary Disease

## 2017-01-07 VITALS — BP 140/76 | HR 65 | Ht 66.0 in | Wt 148.6 lb

## 2017-01-07 DIAGNOSIS — I519 Heart disease, unspecified: Secondary | ICD-10-CM | POA: Diagnosis not present

## 2017-01-07 DIAGNOSIS — R05 Cough: Secondary | ICD-10-CM

## 2017-01-07 DIAGNOSIS — I5189 Other ill-defined heart diseases: Secondary | ICD-10-CM

## 2017-01-07 DIAGNOSIS — J9611 Chronic respiratory failure with hypoxia: Secondary | ICD-10-CM

## 2017-01-07 DIAGNOSIS — R053 Chronic cough: Secondary | ICD-10-CM

## 2017-01-07 DIAGNOSIS — R531 Weakness: Secondary | ICD-10-CM | POA: Diagnosis not present

## 2017-01-07 DIAGNOSIS — J432 Centrilobular emphysema: Secondary | ICD-10-CM

## 2017-01-07 LAB — CBC WITH DIFFERENTIAL/PLATELET
BASOS PCT: 1.3 % (ref 0.0–3.0)
Basophils Absolute: 0.1 10*3/uL (ref 0.0–0.1)
EOS ABS: 0.5 10*3/uL (ref 0.0–0.7)
Eosinophils Relative: 5.1 % — ABNORMAL HIGH (ref 0.0–5.0)
HEMATOCRIT: 37.3 % — AB (ref 39.0–52.0)
HEMOGLOBIN: 12.3 g/dL — AB (ref 13.0–17.0)
LYMPHS PCT: 24.9 % (ref 12.0–46.0)
Lymphs Abs: 2.4 10*3/uL (ref 0.7–4.0)
MCHC: 32.9 g/dL (ref 30.0–36.0)
MCV: 96.5 fl (ref 78.0–100.0)
Monocytes Absolute: 0.7 10*3/uL (ref 0.1–1.0)
Monocytes Relative: 7.5 % (ref 3.0–12.0)
Neutro Abs: 5.8 10*3/uL (ref 1.4–7.7)
Neutrophils Relative %: 61.2 % (ref 43.0–77.0)
Platelets: 123 10*3/uL — ABNORMAL LOW (ref 150.0–400.0)
RBC: 3.86 Mil/uL — AB (ref 4.22–5.81)
RDW: 13.8 % (ref 11.5–15.5)
WBC: 9.5 10*3/uL (ref 4.0–10.5)

## 2017-01-07 NOTE — Progress Notes (Signed)
Subjective:    Patient ID: [name] Seller Sr., male    DOB: [birth year], 83 y.o.   MRN: [id number]  Synopsis: former patient of Dr. Gwenette Greet with COPD Smoked cigarettes for many years, from age 80 to age 9 1.5 ppd CXR/CT chest 2014:  Emphysematous changes with mild scarring, +dilated esophagus with A/F level.  PFT"s 08/2012:  Normal FEV1%, but FVC improved 17% with BD.  +++ airtrapping/ +FVL, no restriction, DLCO normal  Echo 09/2012:  Normal LV EF, diastolic dysfxn, mildly dilated LA, RV normal.  CT angio 09/2012:  No PE, small nodule posterior LUL, GG anterior lingula ?significance, scattered areas of atx.  No significant emphysematous changes or ISLD  HPI Chief Complaint  Patient presents with  . Follow-up    pt c/o increased prod cough with white/gray/yellow mucus X2 weeks.  Also notes sinus congestion.  denies chest pain.     [name] [name] says that he is coughing a lot.  He says his legs are weak.  He hasn't been sick since the last visit.  He still coughs a lot and brings up grey mucus fairly frequently. No bronchitis or pneumonia since the last visit.   He and his PCP are working on his osteoporosis.    He is walking a little, but not much.    He feels like his legs are weak.    No recent bleeding. He feels that the weakness has increased since the last visit. He's not doing very much exercise. Carrying his garbage can out to the curb makes him short of breath.  Past Medical History:  Diagnosis Date  . Anemia   . COPD (chronic obstructive pulmonary disease) (HCC)    Mild  . HTN (hypertension)   . Hypothyroidism       Review of Systems  Constitutional: Negative for chills, fatigue and fever.  HENT: Negative for postnasal drip, rhinorrhea and sinus pressure.   Respiratory: Negative for cough, shortness of breath and wheezing.   Cardiovascular: Positive for leg swelling. Negative for chest pain and palpitations.       Objective:   Physical Exam  Vitals:   01/07/17  1357  BP: 140/76  Pulse: 65  SpO2: 95%  Weight: 148 lb 9.6 oz (67.4 kg)  Height: 5\' 6"  (1.676 m)   RA  Gen: chronically ill appearing HENT: OP clear, TM's clear, neck supple PULM: rhonchi bilaterally, some wheezing, clears with cough, normal percussion CV: RRR, no mgr, trace edema GI: BS+, soft, nontender Derm: trace edema, dry skin legs Psyche: normal mood and affect     BMET    Component Value Date/Time   NA 139 11/06/2016 1433   K 3.7 11/06/2016 1433   CL 105 11/06/2016 1433   CO2 26 11/06/2016 1433   GLUCOSE 125 (H) 11/06/2016 1433   BUN 24 (H) 11/06/2016 1433   CREATININE 1.14 11/06/2016 1433   CREATININE 1.27 10/24/2012 1006   CALCIUM 8.9 11/06/2016 1433   GFRNONAA 63 (L) 10/10/2012 0440   GFRAA 73 (L) 10/10/2012 0440       Assessment & Plan:  Weakness - Plan: CBC w/Diff, Pulse oximetry, overnight  Diastolic dysfunction  Centrilobular emphysema (HCC)  Chronic respiratory failure with hypoxia (HCC)  Chronic cough   Discussion: Mr. Pryor is complaining of some weakness but no worsening of his cough or shortness of breath. He does have persistent cough with chest congestion on a daily basis but it doesn't sound as if this has worsened recently. It doesn't sound as  if he's been taking his antacid on a regular basis so I think this is contributing to some degree to his cough. I've encouraged him today to make sure he stays on top of his mucociliary clearance regimen, it's not clear to me that he's following this closely.  In regards to his weakness, because of his history of anemia when negative CBC and I think we need to get an overnight oximetry test to see if his oxygen level was low when he sleeping.  Plan: For your COPD: Continue taking Trelegy once daily  For your acid reflux: Keep taking Prilosec daily  For your chronic bronchitis and mucus production: Keep using the flutter valve 4-5 breaths, 4-5 times a day Keep taking the hypertonic saline  twice a day  For your weakness:  We will check a complete blood count today We will check an overnight oximetry test to measure your oxygen  He taking your other medications as you are doing  We will see you back in 3 months or sooner if needed  Current Outpatient Prescriptions:  .  albuterol (PROVENTIL) (5 MG/ML) 0.5% nebulizer solution, Take 2.5 mg by nebulization every 6 (six) hours as needed for wheezing or shortness of breath., Disp: , Rfl:  .  azithromycin (ZITHROMAX) 250 MG tablet, TAKE ONE TABLET BY MOUTH ONCE DAILY, Disp: 30 tablet, Rfl: 3 .  Calcium Carbonate-Vitamin D (CALCIUM-VITAMIN D) 500-200 MG-UNIT tablet, Take 1 tablet by mouth daily. , Disp: , Rfl:  .  diltiazem (TIAZAC) 120 MG 24 hr capsule, Take 120 mg by mouth daily., Disp: , Rfl:  .  Ferrous Sulfate (IRON) 28 MG TABS, Take 1 tablet by mouth daily., Disp: , Rfl:  .  finasteride (PROSCAR) 5 MG tablet, Take 5 mg by mouth daily., Disp: , Rfl:  .  Fluticasone-Umeclidin-Vilant (TRELEGY ELLIPTA) 100-62.5-25 MCG/INH AEPB, Inhale 1 puff into the lungs daily., Disp: 60 each, Rfl: 11 .  furosemide (LASIX) 20 MG tablet, Take 20 mg by mouth as needed. Take 1 tablet daily as needed can increase to 2 tabs a day for increased swelling., Disp: , Rfl:  .  guaiFENesin (MUCINEX) 600 MG 12 hr tablet, Take 600 mg by mouth daily., Disp: , Rfl:  .  levothyroxine (SYNTHROID, LEVOTHROID) 100 MCG tablet, Take 100 mcg by mouth daily., Disp: , Rfl:  .  Multiple Vitamin (MULTIVITAMIN) tablet, Take 1 tablet by mouth daily., Disp: , Rfl:  .  omeprazole (PRILOSEC) 40 MG capsule, Take 40 mg by mouth daily. , Disp: , Rfl:  .  predniSONE (DELTASONE) 20 MG tablet, Take 1 tablet (20 mg total) by mouth daily with breakfast., Disp: 5 tablet, Rfl: 0 .  Respiratory Therapy Supplies (FLUTTER) DEVI, Use as directed, Disp: 1 each, Rfl: 0 .  sodium chloride HYPERTONIC 3 % nebulizer solution, Take by nebulization 2 (two) times daily. DX: J43.2, Disp: 750 mL, Rfl:  11 .  tamsulosin (FLOMAX) 0.4 MG CAPS, Take 0.4 mg by mouth daily., Disp: , Rfl:

## 2017-01-07 NOTE — Patient Instructions (Addendum)
For your COPD: Continue taking Trelegy once daily  For your acid reflux: Keep taking Prilosec daily  For your chronic bronchitis and mucus production: Keep using the flutter valve 4-5 breaths, 4-5 times a day Keep taking the hypertonic saline twice a day  For your weakness:  We will check a complete blood count today We will check an overnight oximetry test to measure your oxygen  He taking your other medications as you are doing  We will see you back in 3 months or sooner if needed

## 2017-01-08 ENCOUNTER — Telehealth: Payer: Self-pay | Admitting: Pulmonary Disease

## 2017-01-08 ENCOUNTER — Ambulatory Visit: Payer: Medicare HMO | Admitting: Pulmonary Disease

## 2017-01-08 MED ORDER — OMEPRAZOLE 40 MG PO CPDR: 40.0000 mg | DELAYED_RELEASE_CAPSULE | Freq: Every day | ORAL | 5 refills | Status: DC

## 2017-01-08 NOTE — Telephone Encounter (Signed)
Spoke with pt's daughter [name] (dpr on file), states prilosec rx needed to be sent to [facility] in [place].  This has been sent.  Nothing further needed.

## 2017-01-13 ENCOUNTER — Encounter: Payer: Self-pay | Admitting: Pulmonary Disease

## 2017-01-14 ENCOUNTER — Telehealth: Payer: Self-pay | Admitting: Pulmonary Disease

## 2017-01-14 NOTE — Telephone Encounter (Signed)
[facility] requesting a signature for the home health speech therapy order They can take a verbal order. Pt has aspiration PNA.  Requesting order: "Home Health Speech Therapy, Evaluate and Treat"  Please advise Dr Lake Bells if you are okay with this. Thanks.

## 2017-01-15 ENCOUNTER — Telehealth: Payer: Self-pay

## 2017-01-15 NOTE — Telephone Encounter (Signed)
-----   Message from Juanito Doom, MD sent at 01/15/2017  3:40 PM EDT ----- A, Please let him know that his oxygen saturation while sleeping on Room air was normal. Thanks, B

## 2017-01-15 NOTE — Telephone Encounter (Signed)
Spoke with Lattie Haw. She has been given the verbal order for speech therapy. Nothing further was needed.

## 2017-01-15 NOTE — Telephone Encounter (Signed)
Spoke with pt's granddaughter Tammy (dpr on file), aware of results/recs.  Nothing further needed.

## 2017-01-15 NOTE — Telephone Encounter (Signed)
OK by me 

## 2017-01-18 ENCOUNTER — Telehealth: Payer: Self-pay | Admitting: Emergency Medicine

## 2017-01-18 NOTE — Telephone Encounter (Signed)
Error.Stanley A Dalton ° °

## 2017-01-25 ENCOUNTER — Telehealth: Payer: Self-pay | Admitting: Pulmonary Disease

## 2017-01-25 NOTE — Telephone Encounter (Signed)
Spoke with pt's daughter [name], aware that as of this afternoon we have not received any records from pt's recent hospital stay at [facility].  States she will have this info refaxed to our office.  I also advised [name] that we did receive an order from pt's homecare company, and that this has been signed and faxed back as of this morning.  Nothing further needed at this time.

## 2017-02-16 ENCOUNTER — Telehealth: Payer: Self-pay | Admitting: Pulmonary Disease

## 2017-02-16 DIAGNOSIS — R05 Cough: Secondary | ICD-10-CM

## 2017-02-16 DIAGNOSIS — R053 Chronic cough: Secondary | ICD-10-CM

## 2017-02-16 NOTE — Telephone Encounter (Signed)
Spoke with Alfredo Bach at [facility], requesting that we order a modified barium swallow to upgrade pt's diet to thin liquids allowed.   Order will need to be sent to [facility].  BQ ok to order?  Thanks!

## 2017-02-17 ENCOUNTER — Encounter: Payer: Self-pay | Admitting: Pulmonary Disease

## 2017-02-17 DIAGNOSIS — T17908A Unspecified foreign body in respiratory tract, part unspecified causing other injury, initial encounter: Secondary | ICD-10-CM | POA: Insufficient documentation

## 2017-02-17 NOTE — Telephone Encounter (Signed)
Order placed. Nothing further needed. 

## 2017-02-17 NOTE — Telephone Encounter (Signed)
OK by me 

## 2017-02-24 ENCOUNTER — Telehealth: Payer: Self-pay | Admitting: Pulmonary Disease

## 2017-02-24 ENCOUNTER — Encounter: Payer: Self-pay | Admitting: Pulmonary Disease

## 2017-02-24 NOTE — Telephone Encounter (Signed)
Called and spoke [name] [name] with [facility]. [name] [name] is requesting a verbal to continue speech therapy with pt 2x weekly for 6 weeks, as pt failed barium swallow today.  BQ please advise. Thanks.

## 2017-03-01 NOTE — Telephone Encounter (Signed)
[place] x 1 for [name] [name]

## 2017-03-01 NOTE — Telephone Encounter (Signed)
OK by me 

## 2017-03-01 NOTE — Telephone Encounter (Signed)
BQ - please advise. Thanks. 

## 2017-03-02 NOTE — Telephone Encounter (Signed)
Spoke with Nunzio Cory. She was given the verbal order. Nothing further was needed.

## 2017-03-03 ENCOUNTER — Ambulatory Visit (INDEPENDENT_AMBULATORY_CARE_PROVIDER_SITE_OTHER)
Admission: RE | Admit: 2017-03-03 | Discharge: 2017-03-03 | Disposition: A | Discharge status: Home / Self Care | Payer: Medicare HMO | Source: Ambulatory Visit | Attending: Adult Health | Admitting: Adult Health

## 2017-03-03 ENCOUNTER — Encounter: Payer: Self-pay | Admitting: Adult Health

## 2017-03-03 ENCOUNTER — Ambulatory Visit (INDEPENDENT_AMBULATORY_CARE_PROVIDER_SITE_OTHER): Payer: Medicare HMO | Admitting: Adult Health

## 2017-03-03 ENCOUNTER — Other Ambulatory Visit (INDEPENDENT_AMBULATORY_CARE_PROVIDER_SITE_OTHER): Payer: Medicare HMO

## 2017-03-03 VITALS — BP 140/70 | HR 71 | Ht 66.0 in | Wt 142.0 lb

## 2017-03-03 DIAGNOSIS — D508 Other iron deficiency anemias: Secondary | ICD-10-CM | POA: Diagnosis not present

## 2017-03-03 DIAGNOSIS — J432 Centrilobular emphysema: Secondary | ICD-10-CM

## 2017-03-03 DIAGNOSIS — D649 Anemia, unspecified: Secondary | ICD-10-CM | POA: Diagnosis not present

## 2017-03-03 LAB — CBC WITH DIFFERENTIAL/PLATELET
BASOS PCT: 0.7 % (ref 0.0–3.0)
Basophils Absolute: 0.1 10*3/uL (ref 0.0–0.1)
EOS PCT: 4.5 % (ref 0.0–5.0)
Eosinophils Absolute: 0.4 10*3/uL (ref 0.0–0.7)
HCT: 38.9 % — ABNORMAL LOW (ref 39.0–52.0)
Hemoglobin: 12.8 g/dL — ABNORMAL LOW (ref 13.0–17.0)
LYMPHS ABS: 1.9 10*3/uL (ref 0.7–4.0)
Lymphocytes Relative: 22 % (ref 12.0–46.0)
MCHC: 32.9 g/dL (ref 30.0–36.0)
MCV: 96.5 fl (ref 78.0–100.0)
MONO ABS: 0.6 10*3/uL (ref 0.1–1.0)
MONOS PCT: 6.5 % (ref 3.0–12.0)
NEUTROS ABS: 5.7 10*3/uL (ref 1.4–7.7)
Neutrophils Relative %: 66.3 % (ref 43.0–77.0)
PLATELETS: 140 10*3/uL — AB (ref 150.0–400.0)
RBC: 4.03 Mil/uL — ABNORMAL LOW (ref 4.22–5.81)
RDW: 13.9 % (ref 11.5–15.5)
WBC: 8.6 10*3/uL (ref 4.0–10.5)

## 2017-03-03 LAB — IBC PANEL
IRON: 49 ug/dL (ref 42–165)
SATURATION RATIOS: 20.5 % (ref 20.0–50.0)
Transferrin: 171 mg/dL — ABNORMAL LOW (ref 212.0–360.0)

## 2017-03-03 MED ORDER — AMOXICILLIN-POT CLAVULANATE 875-125 MG PO TABS: 1.0000 | ORAL_TABLET | Freq: Two times a day (BID) | ORAL | 0 refills | Status: AC

## 2017-03-03 MED ORDER — PREDNISONE 20 MG PO TABS: 20.0000 mg | ORAL_TABLET | Freq: Every day | ORAL | 0 refills | Status: DC

## 2017-03-03 NOTE — Patient Instructions (Addendum)
Augmentin 875mg  Twice daily  For 7 days , take w/ food.  Prednisone for next 5 days .  Eat yogurt daily .  Begin Probiotic daily  Hold azitrhomycin while on augmentin , once done restart Azithromycin.  Mucinex DM Twice daily  .As needed  Cough/congestion  Continue  on Flutter valve Three times a day   Chest xray and labs  Please contact office for sooner follow up if symptoms do not improve or worsen or seek emergency care  Follow up Dr. Lake Bells in 6-8 weeks and As needed

## 2017-03-03 NOTE — Progress Notes (Signed)
@Patient  ID: [name] Seller Sr., male    DOB: 07-27-1924, 83 y.o.   MRN: [id number]  Chief Complaint  Patient presents with  . Acute Visit    Referring provider: Renaldo Reel, DO  HPI: 83 year-old male former smoker followed for emphysema  Synopsis: former patient of Dr. Gwenette Greet with COPD Smoked cigarettes for many years, from age 27 to age 68 1.5 ppd CXR/CT chest 2014:  Emphysematous changes with mild scarring, +dilated esophagus with A/F level.  PFT"s 08/2012:  Normal FEV1%, but FVC improved 17% with BD.  +++ airtrapping/ +FVL, no restriction, DLCO normal  Echo 09/2012:  Normal LV EF, diastolic dysfxn, mildly dilated LA, RV normal.  CT angio 09/2012:  No PE, small nodule posterior LUL, GG anterior lingula ?significance, scattered areas of atx.  No significant emphysematous changes or ISLD 07/2015 Pseudomonas (pan sensitive )  Sputum cx and AFB neg on 12/23/15 .   03/03/2017 Acute OV : COPD  Patient presents for an acute office visit. He complains of one week of increased cough with thick mucus, intermittent wheezing, and more shortness of breath. He denies any fever, hemoptysis, orthopnea, PND, or increased leg swelling. He has been undergoing speech therapy for dysphagia and feels like it is helping some. He continues with nectar thick liquids. He remains on TRELEGY .  He does take azithromycin daily. Uses flutter valve. 2-3 times a day Appetite is good with no nausea, vomiting or diarrhea Family requested. Labs today so they can take him to his primary care physician. He has chronic anemia .   Allergies  Allergen Reactions  . Codeine Nausea And Vomiting    Immunization History  Administered Date(s) Administered  . Influenza Split 02/15/2012, 02/13/2014, 05/03/2015  . Influenza, High Dose Seasonal PF 02/28/2016  . Influenza,inj,Quad PF,6+ Mos 03/08/2013  . Pneumococcal Polysaccharide-23 06/15/2010    Past Medical History:  Diagnosis Date  . Anemia   . COPD (chronic  obstructive pulmonary disease) (HCC)    Mild  . HTN (hypertension)   . Hypothyroidism     Tobacco History: History  Smoking Status  . Former Smoker  . Packs/day: 1.50  . Years: 25.00  . Types: Cigarettes  . Quit date: 06/16/1971  Smokeless Tobacco  . Never Used    Comment: started at 83 years old   Counseling given: Not Answered   Outpatient Encounter Prescriptions as of 03/03/2017  Medication Sig  . albuterol (PROVENTIL) (5 MG/ML) 0.5% nebulizer solution Take 2.5 mg by nebulization every 6 (six) hours as needed for wheezing or shortness of breath.  Marland Kitchen azithromycin (ZITHROMAX) 250 MG tablet TAKE ONE TABLET BY MOUTH ONCE DAILY  . Calcium Carbonate-Vitamin D (CALCIUM-VITAMIN D) 500-200 MG-UNIT tablet Take 1 tablet by mouth daily.   Marland Kitchen diltiazem (TIAZAC) 120 MG 24 hr capsule Take 120 mg by mouth daily.  . Ferrous Sulfate (IRON) 28 MG TABS Take 1 tablet by mouth daily.  . finasteride (PROSCAR) 5 MG tablet Take 5 mg by mouth daily.  . Fluticasone-Umeclidin-Vilant (TRELEGY ELLIPTA) 100-62.5-25 MCG/INH AEPB Inhale 1 puff into the lungs daily.  . furosemide (LASIX) 20 MG tablet Take 20 mg by mouth as needed. Take 1 tablet daily as needed can increase to 2 tabs a day for increased swelling.  Marland Kitchen guaiFENesin (MUCINEX) 600 MG 12 hr tablet Take 600 mg by mouth daily.  Marland Kitchen levothyroxine (SYNTHROID, LEVOTHROID) 100 MCG tablet Take 100 mcg by mouth daily.  . Multiple Vitamin (MULTIVITAMIN) tablet Take 1 tablet by mouth daily.  Marland Kitchen  omeprazole (PRILOSEC) 40 MG capsule Take 1 capsule (40 mg total) by mouth daily.  . predniSONE (DELTASONE) 20 MG tablet Take 1 tablet (20 mg total) by mouth daily with breakfast.  . Respiratory Therapy Supplies (FLUTTER) DEVI Use as directed  . sodium chloride HYPERTONIC 3 % nebulizer solution Take by nebulization 2 (two) times daily. DX: J43.2  . tamsulosin (FLOMAX) 0.4 MG CAPS Take 0.4 mg by mouth daily.  . [DISCONTINUED] predniSONE (DELTASONE) 20 MG tablet Take 1 tablet (20  mg total) by mouth daily with breakfast.  . amoxicillin-clavulanate (AUGMENTIN) 875-125 MG tablet Take 1 tablet by mouth 2 (two) times daily.   No facility-administered encounter medications on file as of 03/03/2017.      Review of Systems  Constitutional:   No  weight loss, night sweats,  Fevers, chills,  +fatigue, or  lassitude.  HEENT:   No headaches,  Difficulty swallowing,  Tooth/dental problems, or  Sore throat,                No sneezing, itching, ear ache,  +nasal congestion, post nasal drip,   CV:  No chest pain,  Orthopnea, PND, swelling in lower extremities, anasarca, dizziness, palpitations, syncope.   GI  No heartburn, indigestion, abdominal pain, nausea, vomiting, diarrhea, change in bowel habits, loss of appetite, bloody stools.   Resp:  .  No chest wall deformity  Skin: no rash or lesions.  GU: no dysuria, change in color of urine, no urgency or frequency.  No flank pain, no hematuria   MS:  No joint pain or swelling.  No decreased range of motion.  No back pain.    Physical Exam  BP 140/70 (BP Location: Left Arm, Cuff Size: Normal)   Pulse 71   Ht 5\' 6"  (1.676 m)   Wt 142 lb (64.4 kg)   SpO2 96%   BMI 22.92 kg/m   GEN: A/Ox3; pleasant , NAD,  Elderly    HEENT:  [place]/AT,  EACs-clear, TMs-wnl, NOSE-clear, THROAT-clear, no lesions, no postnasal drip or exudate noted.   NECK:  Supple w/ fair ROM; no JVD; normal carotid impulses w/o bruits; no thyromegaly or nodules palpated; no lymphadenopathy.    RESP  Few rhonchi   no accessory muscle use, no dullness to percussion  CARD:  RRR, no m/r/g, no peripheral edema, pulses intact, no cyanosis or clubbing.  GI:   Soft & nt; nml bowel sounds; no organomegaly or masses detected.   Musco: Warm bil, no deformities or joint swelling noted.   Neuro: alert, no focal deficits noted.    Skin: Warm, no lesions or rashes    Lab Results:  CBC  BMET  BNP No results found for: BNP  ProBNP  Imaging: No  results found.   Assessment & Plan:   COPD (chronic obstructive pulmonary disease) with emphysema ([place]) Flare  Check cxr   Plan  Patient Instructions  Augmentin 875mg  Twice daily  For 7 days , take w/ food.  Prednisone for next 5 days .  Eat yogurt daily .  Begin Probiotic daily  Hold azitrhomycin while on augmentin , once done restart Azithromycin.  Mucinex DM Twice daily  .As needed  Cough/congestion  Continue  on Flutter valve Three times a day   Chest xray and labs  Please contact office for sooner follow up if symptoms do not improve or worsen or seek emergency care  Follow up Dr. Lake Bells in 6-8 weeks and As needed  Normocytic normochromic anemia Labs today  Fax results to PCP      Rexene Edison, NP 03/03/2017

## 2017-03-03 NOTE — Assessment & Plan Note (Signed)
Labs today  Fax results to PCP

## 2017-03-03 NOTE — Assessment & Plan Note (Signed)
Flare  Check cxr   Plan  Patient Instructions  Augmentin 875mg  Twice daily  For 7 days , take w/ food.  Prednisone for next 5 days .  Eat yogurt daily .  Begin Probiotic daily  Hold azitrhomycin while on augmentin , once done restart Azithromycin.  Mucinex DM Twice daily  .As needed  Cough/congestion  Continue  on Flutter valve Three times a day   Chest xray and labs  Please contact office for sooner follow up if symptoms do not improve or worsen or seek emergency care  Follow up Dr. Lake Bells in 6-8 weeks and As needed

## 2017-03-04 ENCOUNTER — Telehealth: Payer: Self-pay | Admitting: Pulmonary Disease

## 2017-03-04 NOTE — Telephone Encounter (Signed)
TP chest x-ray looks normal. Please advise so I can call pt's daughter. Thanks.

## 2017-03-05 NOTE — Telephone Encounter (Signed)
Results already done by TP and pt's granddaughter is aware of results Will sign off

## 2017-03-06 ENCOUNTER — Other Ambulatory Visit: Payer: Self-pay | Admitting: Pulmonary Disease

## 2017-03-08 ENCOUNTER — Ambulatory Visit: Payer: Medicare HMO | Admitting: Pulmonary Disease

## 2017-03-15 ENCOUNTER — Telehealth: Payer: Self-pay | Admitting: Pulmonary Disease

## 2017-03-15 DIAGNOSIS — R1310 Dysphagia, unspecified: Secondary | ICD-10-CM

## 2017-03-15 NOTE — Telephone Encounter (Signed)
Spoke with Lattie Haw and she states pt needs to receive speech therapy for swallowing, she states he doesn't meet homebound criteria to receive home health but he needs outpatient services. BQ can we send in referral?

## 2017-03-15 NOTE — Telephone Encounter (Signed)
yes

## 2017-03-16 NOTE — Telephone Encounter (Signed)
Order placed for speech therapy.  Nothing further needed.

## 2017-04-22 ENCOUNTER — Ambulatory Visit: Payer: Medicare HMO | Admitting: Pulmonary Disease

## 2017-05-13 ENCOUNTER — Other Ambulatory Visit (INDEPENDENT_AMBULATORY_CARE_PROVIDER_SITE_OTHER): Payer: Medicare HMO

## 2017-05-13 ENCOUNTER — Ambulatory Visit: Payer: Medicare HMO | Admitting: Pulmonary Disease

## 2017-05-13 ENCOUNTER — Encounter: Payer: Self-pay | Admitting: Pulmonary Disease

## 2017-05-13 VITALS — BP 142/82 | HR 81 | Ht 66.0 in | Wt 141.2 lb

## 2017-05-13 DIAGNOSIS — R1310 Dysphagia, unspecified: Secondary | ICD-10-CM

## 2017-05-13 DIAGNOSIS — Z5181 Encounter for therapeutic drug level monitoring: Secondary | ICD-10-CM

## 2017-05-13 DIAGNOSIS — M7989 Other specified soft tissue disorders: Secondary | ICD-10-CM | POA: Diagnosis not present

## 2017-05-13 DIAGNOSIS — J432 Centrilobular emphysema: Secondary | ICD-10-CM | POA: Diagnosis not present

## 2017-05-13 LAB — COMPREHENSIVE METABOLIC PANEL
ALBUMIN: 3.7 g/dL (ref 3.5–5.2)
ALK PHOS: 58 U/L (ref 39–117)
ALT: 11 U/L (ref 0–53)
AST: 16 U/L (ref 0–37)
BUN: 35 mg/dL — ABNORMAL HIGH (ref 6–23)
CALCIUM: 9.5 mg/dL (ref 8.4–10.5)
CHLORIDE: 105 meq/L (ref 96–112)
CO2: 27 mEq/L (ref 19–32)
Creatinine, Ser: 1.13 mg/dL (ref 0.40–1.50)
GFR: 64.81 mL/min (ref >60.00)
Glucose, Bld: 102 mg/dL — ABNORMAL HIGH (ref 70–99)
POTASSIUM: 4.5 meq/L (ref 3.5–5.1)
Sodium: 139 mEq/L (ref 135–145)
Total Bilirubin: 0.6 mg/dL (ref 0.2–1.2)
Total Protein: 7.1 g/dL (ref 6.0–8.3)

## 2017-05-13 NOTE — Progress Notes (Deleted)
Subjective:    Patient ID: [name] Seller Sr., male    DOB: [birth year], 83 y.o.   MRN: [id number]  Synopsis: former patient of Dr. Gwenette Greet with COPD Smoked cigarettes for many years, from age 19 to age 81 1.5 ppd Noted to aspirate in 2018  HPI No chief complaint on file.  ***PSA, work on mucociliary clearance  Past Medical History:  Diagnosis Date  . Anemia   . COPD (chronic obstructive pulmonary disease) (HCC)    Mild  . HTN (hypertension)   . Hypothyroidism       Review of Systems  Constitutional: Negative for chills, fatigue and fever.  HENT: Negative for postnasal drip, rhinorrhea and sinus pressure.   Respiratory: Negative for cough, shortness of breath and wheezing.   Cardiovascular: Positive for leg swelling. Negative for chest pain and palpitations.       Objective:   Physical Exam  There were no vitals filed for this visit. RA  ***  Chest imaging: CXR/CT chest 2014:  Emphysematous changes with mild scarring, +dilated esophagus with A/F level.  CT angio 09/2012:  No PE, small nodule posterior LUL, GG anterior lingula ?significance, scattered areas of atx.  No significant emphysematous changes or ISLD 02/2017 CXR images reviewed: some chronic scarring R base, some emphysema, otherwise clear  PFT PFT"s 08/2012:  Normal FEV1%, but FVC improved 17% with BD.  +++ airtrapping/ +FVL, no restriction, DLCO normal   Echo: Echo 09/2012:  Normal LV EF, diastolic dysfxn, mildly dilated LA, RV normal.    BMET    Component Value Date/Time   NA 139 11/06/2016 1433   K 3.7 11/06/2016 1433   CL 105 11/06/2016 1433   CO2 26 11/06/2016 1433   GLUCOSE 125 (H) 11/06/2016 1433   BUN 24 (H) 11/06/2016 1433   CREATININE 1.14 11/06/2016 1433   CREATININE 1.27 10/24/2012 1006   CALCIUM 8.9 11/06/2016 1433   GFRNONAA 63 (L) 10/10/2012 0440   GFRAA 73 (L) 10/10/2012 0440   His last visit with our NP reviewed where he was treated with Trelegy and mucociliary clearance was  encouraged     Assessment & Plan:  No diagnosis found.  ***LFT, EKG   Current Outpatient Medications:  .  albuterol (PROVENTIL) (5 MG/ML) 0.5% nebulizer solution, Take 2.5 mg by nebulization every 6 (six) hours as needed for wheezing or shortness of breath., Disp: , Rfl:  .  azithromycin (ZITHROMAX) 250 MG tablet, TAKE 1 TABLET BY MOUTH ONCE DAILY, Disp: 30 tablet, Rfl: 3 .  Calcium Carbonate-Vitamin D (CALCIUM-VITAMIN D) 500-200 MG-UNIT tablet, Take 1 tablet by mouth daily. , Disp: , Rfl:  .  diltiazem (TIAZAC) 120 MG 24 hr capsule, Take 120 mg by mouth daily., Disp: , Rfl:  .  Ferrous Sulfate (IRON) 28 MG TABS, Take 1 tablet by mouth daily., Disp: , Rfl:  .  finasteride (PROSCAR) 5 MG tablet, Take 5 mg by mouth daily., Disp: , Rfl:  .  Fluticasone-Umeclidin-Vilant (TRELEGY ELLIPTA) 100-62.5-25 MCG/INH AEPB, Inhale 1 puff into the lungs daily., Disp: 60 each, Rfl: 11 .  furosemide (LASIX) 20 MG tablet, Take 20 mg by mouth as needed. Take 1 tablet daily as needed can increase to 2 tabs a day for increased swelling., Disp: , Rfl:  .  guaiFENesin (MUCINEX) 600 MG 12 hr tablet, Take 600 mg by mouth daily., Disp: , Rfl:  .  levothyroxine (SYNTHROID, LEVOTHROID) 100 MCG tablet, Take 100 mcg by mouth daily., Disp: , Rfl:  .  Multiple Vitamin (  MULTIVITAMIN) tablet, Take 1 tablet by mouth daily., Disp: , Rfl:  .  omeprazole (PRILOSEC) 40 MG capsule, Take 1 capsule (40 mg total) by mouth daily., Disp: 30 capsule, Rfl: 5 .  predniSONE (DELTASONE) 20 MG tablet, Take 1 tablet (20 mg total) by mouth daily with breakfast., Disp: 5 tablet, Rfl: 0 .  Respiratory Therapy Supplies (FLUTTER) DEVI, Use as directed, Disp: 1 each, Rfl: 0 .  sodium chloride HYPERTONIC 3 % nebulizer solution, Take by nebulization 2 (two) times daily. DX: J43.2, Disp: 750 mL, Rfl: 11 .  tamsulosin (FLOMAX) 0.4 MG CAPS, Take 0.4 mg by mouth daily., Disp: , Rfl:

## 2017-05-13 NOTE — Patient Instructions (Signed)
Leg swelling/diastolic heart failure: Weigh yourself every day Limit sodium to less than 2 g a day If your weight is above 150 pounds or if you have ankle swelling take a diuretic (Lasix)  Recurrent aspiration: Follow the recommendations from the speech therapist Be sure to take small bites, chew well, and completely clear your throat before taking the next bite  COPD: Continue Trelogy Continue daily Azithromycin Use albuterol 3 times a day for the next 5 days Use Mucinex twice a day Call me if you feel worse We will check an EKG and blood test today to make sure the azithromycin is not causing any problems  We will see you back in 3 months or sooner if needed

## 2017-05-13 NOTE — Progress Notes (Signed)
Subjective:    Patient ID: [name] Seller Sr., male    DOB: April 09, 1925, 83 y.o.   MRN: [id number]  Synopsis: former patient of Dr. Gwenette Greet with COPD Smoked cigarettes for many years, from age 72 to age 54 1.5 ppd CXR/CT chest 2014:  Emphysematous changes with mild scarring, +dilated esophagus with A/F level.  PFT"s 08/2012:  Normal FEV1%, but FVC improved 17% with BD.  +++ airtrapping/ +FVL, no restriction, DLCO normal  Echo 09/2012:  Normal LV EF, diastolic dysfxn, mildly dilated LA, RV normal.  CT angio 09/2012:  No PE, small nodule posterior LUL, GG anterior lingula ?significance, scattered areas of atx.  No significant emphysematous changes or ISLD  HPI Chief Complaint  Patient presents with  . Follow-up    pt states he is doing well, does note some prod cough with gray mucus.  states congestion is all in chest.     [name] [name] says he has been doing pretty well lately.   He says that he has been doing pretty good lately.  His breathing hasn't been bothering him as much lately he needed to take some prednisone for a short course and some antibiotics for hoarseness.  He had a lot of cough.  He feels better now.    The hypertonic saline hasn't been helping much.  He is still taking the daily azithromycin.  He is no longer participating in speech therapy.    He is walking around some without to omuch dyspnea.  He has been walking for exercise, though he is limited by the weather.    His legs have been doing OK, no swelling lately.  He has not been taking the lasix lately.  He weighs daily, his weight has been around 145.    He is still producing green mucus every day.  Past Medical History:  Diagnosis Date  . Anemia   . COPD (chronic obstructive pulmonary disease) (HCC)    Mild  . HTN (hypertension)   . Hypothyroidism       Review of Systems  Constitutional: Negative for chills, fatigue and fever.  HENT: Negative for postnasal drip, rhinorrhea and sinus pressure.     Respiratory: Negative for cough, shortness of breath and wheezing.   Cardiovascular: Positive for leg swelling. Negative for chest pain and palpitations.       Objective:   Physical Exam  Vitals:   05/13/17 1053  BP: (!) 142/82  Pulse: 81  SpO2: 93%  Weight: 141 lb 3.2 oz (64 kg)  Height: _0  (1.676 m)   RA  Gen: elderly, chronically ill appearing HENT: OP clear, TM's clear, neck supple PULM: Wheezing bilaterally today B, normal percussion CV: RRR, no mgr, trace edema GI: BS+, soft, nontender Derm: no cyanosis or rash Psyche: normal mood and affect      BMET    Component Value Date/Time   NA 139 11/06/2016 1433   K 3.7 11/06/2016 1433   CL 105 11/06/2016 1433   CO2 26 11/06/2016 1433   GLUCOSE 125 (H) 11/06/2016 1433   BUN 24 (H) 11/06/2016 1433   CREATININE 1.14 11/06/2016 1433   CREATININE 1.27 10/24/2012 1006   CALCIUM 8.9 11/06/2016 1433   GFRNONAA 63 (L) 10/10/2012 0440   GFRAA 73 (L) 10/10/2012 0440       Assessment & Plan:  Therapeutic drug monitoring - Plan: Comp Met (CMET), EKG 12-Lead  Dysphagia, unspecified type  Centrilobular emphysema (HCC)  Leg swelling  Discussion: [name] [name] told me that he  is doing well today for the first time ever I believe.  However, he did have a recent exacerbation of his COPD and he had to take an antibiotic and prednisone.  In general I think he has done better since we encouraged him to follow speech therapy's recommendations to prevent recurrent aspiration and since we started daily azithromycin.  We need to get an EKG and blood work today to make sure there is no evidence of a cardiac arrhythmia or liver function abnormality secondary to the azithromycin.  He should continue taking Trelegy on a daily basis.  Because he is still wheezing on exam today I have encouraged him to resume Mucinex, start using albuterol 3 times a day.  He did not get much benefit from the hypertonic saline so we will take this off his  medication list today.  Plan: Leg swelling/diastolic heart failure: Weigh yourself every day Limit sodium to less than 2 g a day If your weight is above 150 pounds or if you have ankle swelling take a diuretic (Lasix)  Recurrent aspiration: Follow the recommendations from the speech therapist Be sure to take small bites, chew well, and completely clear your throat before taking the next bite  COPD: Continue Trelogy Continue daily Azithromycin Use albuterol 3 times a day for the next 5 days Use Mucinex twice a day Call me if you feel worse We will check an EKG and blood test today to make sure the azithromycin is not causing any problems  We will see you back in 3 months or sooner if needed   Current Outpatient Medications:  .  albuterol (PROVENTIL) (5 MG/ML) 0.5% nebulizer solution, Take 2.5 mg by nebulization every 6 (six) hours as needed for wheezing or shortness of breath., Disp: , Rfl:  .  azithromycin (ZITHROMAX) 250 MG tablet, TAKE 1 TABLET BY MOUTH ONCE DAILY, Disp: 30 tablet, Rfl: 3 .  Calcium Carbonate-Vitamin D (CALCIUM-VITAMIN D) 500-200 MG-UNIT tablet, Take 1 tablet by mouth daily. , Disp: , Rfl:  .  diltiazem (TIAZAC) 120 MG 24 hr capsule, Take 120 mg by mouth daily., Disp: , Rfl:  .  Ferrous Sulfate (IRON) 28 MG TABS, Take 1 tablet by mouth daily., Disp: , Rfl:  .  finasteride (PROSCAR) 5 MG tablet, Take 5 mg by mouth daily., Disp: , Rfl:  .  Fluticasone-Umeclidin-Vilant (TRELEGY ELLIPTA) 100-62.5-25 MCG/INH AEPB, Inhale 1 puff into the lungs daily., Disp: 60 each, Rfl: 11 .  furosemide (LASIX) 20 MG tablet, Take 20 mg by mouth as needed. Take 1 tablet daily as needed can increase to 2 tabs a day for increased swelling., Disp: , Rfl:  .  guaiFENesin (MUCINEX) 600 MG 12 hr tablet, Take 600 mg by mouth daily., Disp: , Rfl:  .  levothyroxine (SYNTHROID, LEVOTHROID) 100 MCG tablet, Take 100 mcg by mouth daily., Disp: , Rfl:  .  Multiple Vitamin (MULTIVITAMIN) tablet, Take  1 tablet by mouth daily., Disp: , Rfl:  .  omeprazole (PRILOSEC) 40 MG capsule, Take 1 capsule (40 mg total) by mouth daily., Disp: 30 capsule, Rfl: 5 .  Respiratory Therapy Supplies (FLUTTER) DEVI, Use as directed, Disp: 1 each, Rfl: 0 .  tamsulosin (FLOMAX) 0.4 MG CAPS, Take 0.4 mg by mouth daily., Disp: , Rfl:  .  sodium chloride HYPERTONIC 3 % nebulizer solution, Take by nebulization 2 (two) times daily. DX: J43.2 (Patient not taking: Reported on 05/13/2017), Disp: 750 mL, Rfl: 11

## 2017-05-14 ENCOUNTER — Ambulatory Visit: Payer: Medicare HMO | Admitting: Pulmonary Disease

## 2017-05-14 NOTE — Progress Notes (Signed)
ATC patient at listed number to inform him of results. [name], answering person, stated patient was not around; unable to leave results with her as she is not listed on DPR. Pt's granddaughter, Lynelle Smoke, contacted as she is emergency contact and listed on DPR and made aware of the results. Nothing further is needed.

## 2017-07-18 ENCOUNTER — Other Ambulatory Visit: Payer: Self-pay | Admitting: Pulmonary Disease

## 2017-07-25 ENCOUNTER — Other Ambulatory Visit: Payer: Self-pay | Admitting: Pulmonary Disease

## 2017-08-10 ENCOUNTER — Ambulatory Visit (INDEPENDENT_AMBULATORY_CARE_PROVIDER_SITE_OTHER)
Admission: RE | Admit: 2017-08-10 | Discharge: 2017-08-10 | Disposition: A | Discharge status: Home / Self Care | Payer: Medicare HMO | Source: Ambulatory Visit | Attending: Pulmonary Disease | Admitting: Pulmonary Disease

## 2017-08-10 ENCOUNTER — Ambulatory Visit: Payer: Medicare HMO | Admitting: Pulmonary Disease

## 2017-08-10 VITALS — BP 142/74 | HR 77 | Temp 97.6°F | Ht 66.0 in | Wt 143.0 lb

## 2017-08-10 DIAGNOSIS — R05 Cough: Secondary | ICD-10-CM | POA: Diagnosis not present

## 2017-08-10 DIAGNOSIS — J441 Chronic obstructive pulmonary disease with (acute) exacerbation: Secondary | ICD-10-CM

## 2017-08-10 DIAGNOSIS — R059 Cough, unspecified: Secondary | ICD-10-CM

## 2017-08-10 DIAGNOSIS — R1310 Dysphagia, unspecified: Secondary | ICD-10-CM | POA: Diagnosis not present

## 2017-08-10 MED ORDER — PREDNISONE 10 MG PO TABS: ORAL_TABLET | ORAL | 0 refills | Status: DC

## 2017-08-10 MED ORDER — AMOXICILLIN-POT CLAVULANATE 875-125 MG PO TABS: 1.0000 | ORAL_TABLET | Freq: Two times a day (BID) | ORAL | 0 refills | Status: DC

## 2017-08-10 NOTE — Progress Notes (Signed)
Subjective:    Patient ID: [name] Seller Sr., male    DOB: May 14, 1925, 84 y.o.   MRN: [id number]  Synopsis: former patient of Dr. Gwenette Greet with COPD Smoked cigarettes for many years, from age 68 to age 36 1.5 ppd CXR/CT chest 2014:  Emphysematous changes with mild scarring, +dilated esophagus with A/F level.  PFT"s 08/2012:  Normal FEV1%, but FVC improved 17% with BD.  +++ airtrapping/ +FVL, no restriction, DLCO normal  Echo 09/2012:  Normal LV EF, diastolic dysfxn, mildly dilated LA, RV normal.  CT angio 09/2012:  No PE, small nodule posterior LUL, GG anterior lingula ?significance, scattered areas of atx.  No significant emphysematous changes or ISLD  HPI Chief Complaint  Patient presents with  . Acute Visit    c/o fatigue, prod cough with yellow mucus, chest soreness X1 week.    [name] [name] has been coughing for a week now. He is coughing up clear mucus, sometimes green to grey in color.  No blood in his mucus. No fever or chills.   He hurts in his ribs when he coughs.   He has more sinus congestion after he coughs.  He had been OK prior. No recent trouble swallowing recently.     Past Medical History:  Diagnosis Date  . Anemia   . COPD (chronic obstructive pulmonary disease) (HCC)    Mild  . HTN (hypertension)   . Hypothyroidism       Review of Systems  Constitutional: Negative for chills, fatigue and fever.  HENT: Negative for postnasal drip, rhinorrhea and sinus pressure.   Respiratory: Negative for cough, shortness of breath and wheezing.   Cardiovascular: Positive for leg swelling. Negative for chest pain and palpitations.       Objective:   Physical Exam  Vitals:   08/10/17 1334  BP: (!) 142/74  Pulse: 77  Temp: 97.6 F (36.4 C)  TempSrc: Oral  SpO2: 93%  Weight: 143 lb (64.9 kg)  Height: 5\' 6"  (1.676 m)   RA  Gen: chronically ill appearing HENT: OP clear, TM's clear, neck supple PULM: wheezing bilaterally B, normal percussion, cough CV: RRR, no  mgr, trace edema GI: BS+, soft, nontender Derm: no cyanosis or rash Psyche: normal mood and affect       BMET    Component Value Date/Time   NA 139 05/13/2017 1203   K 4.5 05/13/2017 1203   CL 105 05/13/2017 1203   CO2 27 05/13/2017 1203   GLUCOSE 102 (H) 05/13/2017 1203   BUN 35 (H) 05/13/2017 1203   CREATININE 1.13 05/13/2017 1203   CREATININE 1.27 10/24/2012 1006   CALCIUM 9.5 05/13/2017 1203   GFRNONAA 63 (L) 10/10/2012 0440   GFRAA 73 (L) 10/10/2012 0440       Assessment & Plan:  Cough - Plan: DG Chest 2 View  Dysphagia, unspecified type  COPD with acute exacerbation ([place])  Discussion: Unfortunately he is having another exacerbation of COPD.  In general we have done well with adding daily azithromycin and reducing the frequency of exacerbations but he has another one today.  He has had symptoms for more than a week so there is no role for checking for influenza at this point.  Because of his history of aspiration we need to get a chest x-ray to make sure he is not having pneumonia.  COPD with acute exacerbation Keep taking Trelegy 1 puff daily Take Augmentin twice a day for the next 5 days, do not take azithromycin while you are  taking Augmentin After completing Augmentin resume azithromycin Take the prednisone taper as prescribed We will get a chest x-ray to look for pneumonia today Continue using albuterol 3 times a day for the next 4-5 days Call me if you would like something for cough Let us know if your symptoms do not improve  Cough: As discussed today, I am happy to prescribe a medicine if you decide that would be appropriate  Dysphasia: Keep following the recommendations from the speech therapist  We will see you back in 2-3 months or sooner if needed   Current Outpatient Medications:  .  albuterol (PROVENTIL) (5 MG/ML) 0.5% nebulizer solution, Take 2.5 mg by nebulization every 6 (six) hours as needed for wheezing or shortness of breath., Disp: ,  Rfl:  .  azithromycin (ZITHROMAX) 250 MG tablet, TAKE 1 TABLET BY MOUTH ONCE DAILY, Disp: 30 tablet, Rfl: 3 .  Calcium Carbonate-Vitamin D (CALCIUM-VITAMIN D) 500-200 MG-UNIT tablet, Take 1 tablet by mouth daily. , Disp: , Rfl:  .  Ferrous Sulfate (IRON) 28 MG TABS, Take 1 tablet by mouth daily., Disp: , Rfl:  .  finasteride (PROSCAR) 5 MG tablet, Take 5 mg by mouth daily., Disp: , Rfl:  .  Fluticasone-Umeclidin-Vilant (TRELEGY ELLIPTA) 100-62.5-25 MCG/INH AEPB, Inhale 1 puff into the lungs daily., Disp: 60 each, Rfl: 11 .  furosemide (LASIX) 20 MG tablet, Take 20 mg by mouth as needed. Take 1 tablet daily as needed can increase to 2 tabs a day for increased swelling., Disp: , Rfl:  .  guaiFENesin (MUCINEX) 600 MG 12 hr tablet, Take 600 mg by mouth daily., Disp: , Rfl:  .  levothyroxine (SYNTHROID, LEVOTHROID) 100 MCG tablet, Take 100 mcg by mouth daily., Disp: , Rfl:  .  Multiple Vitamin (MULTIVITAMIN) tablet, Take 1 tablet by mouth daily., Disp: , Rfl:  .  omeprazole (PRILOSEC) 40 MG capsule, TAKE 1 CAPSULE BY MOUTH ONCE DAILY, Disp: 90 capsule, Rfl: 1 .  Respiratory Therapy Supplies (FLUTTER) DEVI, Use as directed, Disp: 1 each, Rfl: 0 .  tamsulosin (FLOMAX) 0.4 MG CAPS, Take 0.4 mg by mouth daily., Disp: , Rfl:  .  amoxicillin-clavulanate (AUGMENTIN) 875-125 MG tablet, Take 1 tablet by mouth 2 (two) times daily., Disp: 10 tablet, Rfl: 0 .  predniSONE (DELTASONE) 10 MG tablet, 40mg X3 days, 30mg  X3 days, 20mg  X2 days, 10mg X2 days, then stop., Disp: 27 tablet, Rfl: 0

## 2017-08-10 NOTE — Patient Instructions (Signed)
COPD with acute exacerbation Keep taking Trelegy 1 puff daily Take Augmentin twice a day for the next 5 days, do not take azithromycin while you are taking Augmentin After completing Augmentin resume azithromycin Take the prednisone taper as prescribed We will get a chest x-ray to look for pneumonia today Continue using albuterol 3 times a day for the next 4-5 days Call me if you would like something for cough Let us know if your symptoms do not improve  Cough: As discussed today, I am happy to prescribe a medicine if you decide that would be appropriate  Dysphasia: Keep following the recommendations from the speech therapist  We will see you back in 2-3 months or sooner if needed

## 2017-08-31 ENCOUNTER — Ambulatory Visit: Payer: Medicare HMO | Admitting: Pulmonary Disease

## 2017-09-05 ENCOUNTER — Other Ambulatory Visit: Payer: Self-pay | Admitting: Pulmonary Disease

## 2017-09-23 ENCOUNTER — Other Ambulatory Visit (INDEPENDENT_AMBULATORY_CARE_PROVIDER_SITE_OTHER): Payer: Medicare HMO

## 2017-09-23 ENCOUNTER — Ambulatory Visit: Payer: Medicare HMO | Admitting: Pulmonary Disease

## 2017-09-23 ENCOUNTER — Encounter: Payer: Self-pay | Admitting: Pulmonary Disease

## 2017-09-23 VITALS — BP 130/60 | HR 65 | Ht 65.0 in | Wt 142.0 lb

## 2017-09-23 DIAGNOSIS — J449 Chronic obstructive pulmonary disease, unspecified: Secondary | ICD-10-CM | POA: Diagnosis not present

## 2017-09-23 DIAGNOSIS — M25473 Effusion, unspecified ankle: Secondary | ICD-10-CM | POA: Diagnosis not present

## 2017-09-23 DIAGNOSIS — I5031 Acute diastolic (congestive) heart failure: Secondary | ICD-10-CM

## 2017-09-23 LAB — BASIC METABOLIC PANEL
BUN: 28 mg/dL — AB (ref 6–23)
CHLORIDE: 106 meq/L (ref 96–112)
CO2: 28 mEq/L (ref 19–32)
Calcium: 8.8 mg/dL (ref 8.4–10.5)
Creatinine, Ser: 1.11 mg/dL (ref 0.40–1.50)
GFR: 66.1 mL/min (ref >60.00)
Glucose, Bld: 94 mg/dL (ref 70–99)
POTASSIUM: 4.2 meq/L (ref 3.5–5.1)
Sodium: 140 mEq/L (ref 135–145)

## 2017-09-23 LAB — BRAIN NATRIURETIC PEPTIDE: Pro B Natriuretic peptide (BNP): 120 pg/mL — ABNORMAL HIGH (ref 0.0–100.0)

## 2017-09-23 NOTE — Patient Instructions (Signed)
Severe COPD with recurrent exacerbations: Keep taking Trelegy 1 puff daily no matter how you feel Use albuterol as needed for chest tightness wheezing or shortness of breath Take azithromycin daily  Fluid retention/pulmonary edema due to acute diastolic heart failure: Take torsemide daily as directed by your primary care physician When the ankle swelling goes away, weigh yourself this is your dry weight If you have no ankle swelling and you only need to use the torsemide if ankle swelling develops or if your weight is 2 pounds or more above your dry weight We will check your kidney function and electrolyte values today as well as something called a BNP test  Call us if your shortness of breath does not improve despite the ankle swelling going away with torsemide  We will see you back in 2 months or sooner if needed

## 2017-09-23 NOTE — Progress Notes (Signed)
Subjective:    Patient ID: [name] Seller Sr., male    DOB: 03-Sep-1924, 84 y.o.   MRN: [id number]  Synopsis: former patient of Dr. Gwenette Greet with COPD Smoked cigarettes for many years, from age 56 to age 85 1.5 ppd CXR/CT chest 2014:  Emphysematous changes with mild scarring, +dilated esophagus with A/F level.  PFT"s 08/2012:  Normal FEV1%, but FVC improved 17% with BD.  +++ airtrapping/ +FVL, no restriction, DLCO normal  Echo 09/2012:  Normal LV EF, diastolic dysfxn, mildly dilated LA, RV normal.  CT angio 09/2012:  No PE, small nodule posterior LUL, GG anterior lingula ?significance, scattered areas of atx.  No significant emphysematous changes or ISLD  HPI Chief Complaint  Patient presents with  . Follow-up    Leg swelling, SOB ,produtive cough grey mucus, wheezing .   Mr. [name] [name] has been struggling a bit more recently.  After we saw him in January he started eating more processed foods and started swelling in his ankles more.  His daughter provides the history today and says that approximately 10 days ago he noted more ankle swelling.  They went to his primary care physician who changed his medication from furosemide to torsemide.  Since then he has been urinating a bit more frequently.  He says that he has to go to the bathroom within an hour of taking the medicine.  His ankle swelling has improved but it still present.  He still coughing up thick gray mucus.  He still taking azithromycin daily and he still taking Trelegy daily.   Past Medical History:  Diagnosis Date  . Anemia   . COPD (chronic obstructive pulmonary disease) (HCC)    Mild  . HTN (hypertension)   . Hypothyroidism       Review of Systems  Constitutional: Negative for chills, fatigue and fever.  HENT: Negative for postnasal drip, rhinorrhea and sinus pressure.   Respiratory: Negative for cough, shortness of breath and wheezing.   Cardiovascular: Positive for leg swelling. Negative for chest pain and palpitations.         Objective:   Physical Exam  Vitals:   09/23/17 1052 09/23/17 1100  BP:  130/60  Pulse:  65  SpO2:  91%  Weight: 142 lb (64.4 kg)   Height: 5\' 5"  (1.651 m)    RA  Gen: chronically ill appearing HENT: OP clear, TM's clear, neck supple PULM: Rhonchi bilaterally, some wheezing, some crackles in bases B, normal percussion CV: RRR, no mgr, trace edema GI: BS+, soft, nontender Derm: significant ankle swelling today, no cyanosis or rash Psyche: normal mood and affect   Chest imaging: February 2019 chest x-ray images independently reviewed showing no pleural effusion or pulmonary edema, some chronic bronchitis changes noted  BMET    Component Value Date/Time   NA 139 05/13/2017 1203   K 4.5 05/13/2017 1203   CL 105 05/13/2017 1203   CO2 27 05/13/2017 1203   GLUCOSE 102 (H) 05/13/2017 1203   BUN 35 (H) 05/13/2017 1203   CREATININE 1.13 05/13/2017 1203   CREATININE 1.27 10/24/2012 1006   CALCIUM 9.5 05/13/2017 1203   GFRNONAA 63 (L) 10/10/2012 0440   GFRAA 73 (L) 10/10/2012 0440       Assessment & Plan:  Swelling of ankle joint, unspecified laterality - Plan: Basic Metabolic Panel (BMET), B Nat Peptide  COPD, severe ([place])  Acute diastolic heart failure ([place])  Discussion: Mr. [name] [name] lost a fair amount of weight in the last year and a half  so that made it difficult for [place] to assess his dry weight.  Today he weighs 142 pounds and is clearly volume overloaded.  Based on his history I think the volume overload and fluid retention is the cause of his wheezing and congestion as we have had this problem with him in the past.  I am hopeful that his COPD has not flared and with ongoing diuresis he should have improvement in his symptoms.  However, if his ankle swelling goes away and he still has increasing chest congestion and mucus production then I have instructed him to call us so that we can call in a prednisone prescription.  Severe COPD with recurrent  exacerbations: Keep taking Trelegy 1 puff daily no matter how you feel Use albuterol as needed for chest tightness wheezing or shortness of breath Take azithromycin daily  Fluid retention/pulmonary edema due to acute diastolic heart failure: Take torsemide daily as directed by your primary care physician When the ankle swelling goes away, weigh yourself this is your dry weight If you have no ankle swelling and you only need to use the torsemide if ankle swelling develops or if your weight is 2 pounds or more above your dry weight We will check your kidney function and electrolyte values today as well as something called a BNP test  Call us if your shortness of breath does not improve despite the ankle swelling going away with torsemide  We will see you back in 2 months or sooner if needed   Current Outpatient Medications:  .  albuterol (PROVENTIL) (5 MG/ML) 0.5% nebulizer solution, Take 2.5 mg by nebulization every 6 (six) hours as needed for wheezing or shortness of breath., Disp: , Rfl:  .  azithromycin (ZITHROMAX) 250 MG tablet, TAKE 1 TABLET BY MOUTH ONCE DAILY, Disp: 30 tablet, Rfl: 3 .  Calcium Carbonate-Vitamin D (CALCIUM-VITAMIN D) 500-200 MG-UNIT tablet, Take 1 tablet by mouth daily. , Disp: , Rfl:  .  Ferrous Sulfate (IRON) 28 MG TABS, Take 1 tablet by mouth daily., Disp: , Rfl:  .  finasteride (PROSCAR) 5 MG tablet, Take 5 mg by mouth daily., Disp: , Rfl:  .  guaiFENesin (MUCINEX) 600 MG 12 hr tablet, Take 600 mg by mouth daily., Disp: , Rfl:  .  levothyroxine (SYNTHROID, LEVOTHROID) 100 MCG tablet, Take 100 mcg by mouth daily., Disp: , Rfl:  .  Multiple Vitamin (MULTIVITAMIN) tablet, Take 1 tablet by mouth daily., Disp: , Rfl:  .  omeprazole (PRILOSEC) 40 MG capsule, TAKE 1 CAPSULE BY MOUTH ONCE DAILY, Disp: 90 capsule, Rfl: 1 .  Respiratory Therapy Supplies (FLUTTER) DEVI, Use as directed, Disp: 1 each, Rfl: 0 .  tamsulosin (FLOMAX) 0.4 MG CAPS, Take 0.4 mg by mouth daily.,  Disp: , Rfl:  .  torsemide (DEMADEX) 20 MG tablet, Take 20 mg by mouth daily., Disp: , Rfl: 2 .  TRELEGY ELLIPTA 100-62.5-25 MCG/INH AEPB, INHALE 1 PUFF ONCE DAILY, Disp: 60 each, Rfl: 11 .  verapamil (CALAN) 120 MG tablet, Take 120 mg by mouth 3 (three) times daily., Disp: , Rfl:

## 2017-10-26 ENCOUNTER — Telehealth: Payer: Self-pay | Admitting: Pulmonary Disease

## 2017-10-26 NOTE — Telephone Encounter (Signed)
Pt's daughter, [name] is aware of below recommendations and voiced her understanding. Nothing further is needed.

## 2017-10-26 NOTE — Telephone Encounter (Signed)
Would keep appointment with urologist Continue toresemide If pain, redness in the leg that is more swollen let us know

## 2017-10-26 NOTE — Telephone Encounter (Signed)
Spoke with [name] (daughter, dpr on file)- states that pt's leg swelling has not improved since starting Torsemide at last OV.  Daughter states that one leg is slightly more swollen, cannot recall which leg is more swollen.  Pt denies any increased SOB.  [name] states that pt is frequently urinating, is seeing urologist on 5/21 to ensure that this is diuretic related and not prostate related.    Weight 143-145lb.  Pt admits to not weighing the same time every day.  Weight at last ov was 142lb.    [name] wants to forward this info to BQ as FYI and see if anything further can be recommended.  BQ please advise.  Thanks!

## 2017-10-28 ENCOUNTER — Other Ambulatory Visit: Payer: Self-pay | Admitting: Pulmonary Disease

## 2017-11-02 ENCOUNTER — Telehealth: Payer: Self-pay | Admitting: Pulmonary Disease

## 2017-11-02 MED ORDER — ALBUTEROL SULFATE (5 MG/ML) 0.5% IN NEBU: 2.5000 mg | INHALATION_SOLUTION | Freq: Four times a day (QID) | RESPIRATORY_TRACT | 3 refills | Status: DC | PRN

## 2017-11-02 NOTE — Telephone Encounter (Signed)
Called to clarify. Patient needs refill of albuterol nebulized solution sent to [facility]. Rx sent, nothing further is needed at this time.

## 2017-11-19 ENCOUNTER — Ambulatory Visit: Payer: Medicare HMO | Admitting: Pulmonary Disease

## 2017-11-19 ENCOUNTER — Encounter: Payer: Self-pay | Admitting: Pulmonary Disease

## 2017-11-19 VITALS — BP 134/62 | HR 74 | Ht 66.0 in | Wt 142.0 lb

## 2017-11-19 DIAGNOSIS — R1310 Dysphagia, unspecified: Secondary | ICD-10-CM | POA: Diagnosis not present

## 2017-11-19 DIAGNOSIS — I5031 Acute diastolic (congestive) heart failure: Secondary | ICD-10-CM | POA: Diagnosis not present

## 2017-11-19 DIAGNOSIS — J449 Chronic obstructive pulmonary disease, unspecified: Secondary | ICD-10-CM | POA: Diagnosis not present

## 2017-11-19 DIAGNOSIS — R296 Repeated falls: Secondary | ICD-10-CM | POA: Diagnosis not present

## 2017-11-19 DIAGNOSIS — S4452XA Injury of cutaneous sensory nerve at shoulder and upper arm level, left arm, initial encounter: Secondary | ICD-10-CM

## 2017-11-19 MED ORDER — AZITHROMYCIN 250 MG PO TABS: 250.0000 mg | ORAL_TABLET | Freq: Every day | ORAL | 3 refills | Status: DC

## 2017-11-19 NOTE — Progress Notes (Signed)
Subjective:    Patient ID: [name] Seller Sr., male    DOB: 1925/03/05, 84 y.o.   MRN: [id number]  Synopsis: former patient of Dr. Gwenette Greet with COPD Smoked cigarettes for many years, from age 30 to age 41 1.5 ppd CXR/CT chest 2014:  Emphysematous changes with mild scarring, +dilated esophagus with A/F level.  PFT"s 08/2012:  Normal FEV1%, but FVC improved 17% with BD.  +++ airtrapping/ +FVL, no restriction, DLCO normal  Echo 09/2012:  Normal LV EF, diastolic dysfxn, mildly dilated LA, RV normal.  CT angio 09/2012:  No PE, small nodule posterior LUL, GG anterior lingula ?significance, scattered areas of atx.  No significant emphysematous changes or ISLD  HPI Chief Complaint  Patient presents with  . Follow-up    ROV, productive cough (clear/thick), wheezing, weakness, legs swelling   His ankle swelling hasn't changed much since the lat visit.  He says that his weight has been around 142 pounds consistently.  He says the torsemide makes him urinate all day.  He had two falls in the last few weeks.  He says that he tripped on a curb and something in the garage.  He has been feeling weaker.  He thinks he may need PT.  He didn't have any dizziness or chest pain around the time of the fall.      Past Medical History:  Diagnosis Date  . Anemia   . COPD (chronic obstructive pulmonary disease) (HCC)    Mild  . HTN (hypertension)   . Hypothyroidism       Review of Systems  Constitutional: Negative for chills, fatigue and fever.  HENT: Negative for postnasal drip, rhinorrhea and sinus pressure.   Respiratory: Negative for cough, shortness of breath and wheezing.   Cardiovascular: Positive for leg swelling. Negative for chest pain and palpitations.       Objective:   Physical Exam  Vitals:   11/19/17 1358  BP: 134/62  Pulse: 74  SpO2: 90%  Weight: 142 lb (64.4 kg)  Height: 5\' 6"  (1.676 m)   RA  Gen: chronically ill appearing HENT: OP clear, TM's clear, neck supple PULM:  Rhonchi bilaterally, no wheezing, normal percussion CV: RRR, no mgr, trace edema GI: BS+, soft, nontender Derm: skin tear left elbow about 6cm long, slight bloody drainage, no surrounding redness or warmth Psyche: normal mood and affect    Chest imaging: February 2019 chest x-ray images independently reviewed showing no pleural effusion or pulmonary edema, some chronic bronchitis changes noted  BMET    Component Value Date/Time   NA 140 09/23/2017 1139   K 4.2 09/23/2017 1139   CL 106 09/23/2017 1139   CO2 28 09/23/2017 1139   GLUCOSE 94 09/23/2017 1139   BUN 28 (H) 09/23/2017 1139   CREATININE 1.11 09/23/2017 1139   CREATININE 1.27 10/24/2012 1006   CALCIUM 8.8 09/23/2017 1139   GFRNONAA 63 (L) 10/10/2012 0440   GFRAA 73 (L) 10/10/2012 0440       Assessment & Plan:  COPD, severe (HCC)  Acute diastolic heart failure (HCC)  Dysphagia, unspecified type  Laceration of skin and cutaneous sensory nerve of left upper extremity, initial encounter  Discussion: Since the last visit he has not had an exacerbation of his COPD but we have been unable to get his weight down with diuretics.  He says that he is going to the bathroom "all day long" which I think speaks to the significance of his prostate disease.  When I talked to him about  using more medical therapy to help relieve the ankle swelling he said he would prefer not to do this right now because he goes to the bathroom so much.  I am concerned about his unsteadiness.  Were going to have physical therapy come out because he has been unsteady recently.  I do not perceive a central mechanism to this (like orthostasis or a cardiac problem) as it sounds as if he has had several mechanical falls.  I have encouraged him to use a walker at home but he is reluctant to do this.  He also has a laceration over his left elbow which we need to help him treat.  It does not appear infected today.  Plan: Left forearm laceration: We will  clean and dress this today in clinic I recommend the to clean it with soapy water twice a day and then let it dry, after letting it dry apply bacitracin ointment.  After applying bacitracin ointment apply cotton gauze lightly to the area. If there is redness or drainage from the area please let us know  Diastolic heart failure: Continue taking torsemide as you are doing  COPD with recurrent exacerbations: Continue Trelegy daily Continue taking azithromycin daily Use albuterol as needed for chest tightness wheezing or shortness of breath  Dysphagia/aspiration: Continue to follow the last recommendations from the speech therapist: Slow, intentional swallowing.  Do not take large bites.  We will see you back in 2 months or sooner if needed   Current Outpatient Medications:  .  albuterol (PROVENTIL) (5 MG/ML) 0.5% nebulizer solution, Take 0.5 mLs (2.5 mg total) by nebulization every 6 (six) hours as needed for wheezing or shortness of breath., Disp: 90 mL, Rfl: 3 .  azithromycin (ZITHROMAX) 250 MG tablet, TAKE 1 TABLET BY MOUTH ONCE DAILY, Disp: 30 tablet, Rfl: 3 .  Calcium Carbonate-Vitamin D (CALCIUM-VITAMIN D) 500-200 MG-UNIT tablet, Take 1 tablet by mouth daily. , Disp: , Rfl:  .  Ferrous Sulfate (IRON) 28 MG TABS, Take 1 tablet by mouth daily., Disp: , Rfl:  .  finasteride (PROSCAR) 5 MG tablet, Take 5 mg by mouth daily., Disp: , Rfl:  .  guaiFENesin (MUCINEX) 600 MG 12 hr tablet, Take 600 mg by mouth daily., Disp: , Rfl:  .  levothyroxine (SYNTHROID, LEVOTHROID) 100 MCG tablet, Take 100 mcg by mouth daily., Disp: , Rfl:  .  Multiple Vitamin (MULTIVITAMIN) tablet, Take 1 tablet by mouth daily., Disp: , Rfl:  .  omeprazole (PRILOSEC) 40 MG capsule, TAKE 1 CAPSULE BY MOUTH ONCE DAILY, Disp: 90 capsule, Rfl: 1 .  Respiratory Therapy Supplies (FLUTTER) DEVI, Use as directed, Disp: 1 each, Rfl: 0 .  tamsulosin (FLOMAX) 0.4 MG CAPS, Take 0.4 mg by mouth daily., Disp: , Rfl:  .  torsemide  (DEMADEX) 20 MG tablet, Take 20 mg by mouth daily., Disp: , Rfl: 2 .  TRELEGY ELLIPTA 100-62.5-25 MCG/INH AEPB, INHALE 1 PUFF ONCE DAILY, Disp: 60 each, Rfl: 11 .  verapamil (CALAN) 120 MG tablet, Take 120 mg by mouth 3 (three) times daily., Disp: , Rfl:

## 2017-11-19 NOTE — Patient Instructions (Signed)
Left forearm laceration: We will clean and dress this today in clinic I recommend the to clean it with soapy water twice a day and then let it dry, after letting it dry apply bacitracin ointment.  After applying bacitracin ointment apply cotton gauze lightly to the area. If there is redness or drainage from the area please let us know  Diastolic heart failure: Continue taking torsemide as you are doing  COPD with recurrent exacerbations: Continue Trelegy daily Continue taking azithromycin daily Use albuterol as needed for chest tightness wheezing or shortness of breath  Dysphagia/aspiration: Continue to follow the last recommendations from the speech therapist: Slow, intentional swallowing.  Do not take large bites.  We will see you back in 2 months or sooner if needed

## 2017-12-06 DIAGNOSIS — K219 Gastro-esophageal reflux disease without esophagitis: Secondary | ICD-10-CM | POA: Insufficient documentation

## 2017-12-06 DIAGNOSIS — I5032 Chronic diastolic (congestive) heart failure: Secondary | ICD-10-CM | POA: Insufficient documentation

## 2017-12-22 ENCOUNTER — Ambulatory Visit: Payer: Medicare HMO | Admitting: Pulmonary Disease

## 2017-12-22 ENCOUNTER — Ambulatory Visit (INDEPENDENT_AMBULATORY_CARE_PROVIDER_SITE_OTHER)
Admission: RE | Admit: 2017-12-22 | Discharge: 2017-12-22 | Disposition: A | Discharge status: Home / Self Care | Payer: Medicare HMO | Source: Ambulatory Visit | Attending: Pulmonary Disease | Admitting: Pulmonary Disease

## 2017-12-22 ENCOUNTER — Other Ambulatory Visit (INDEPENDENT_AMBULATORY_CARE_PROVIDER_SITE_OTHER): Payer: Medicare HMO

## 2017-12-22 ENCOUNTER — Encounter: Payer: Self-pay | Admitting: Pulmonary Disease

## 2017-12-22 VITALS — BP 106/56 | HR 66

## 2017-12-22 DIAGNOSIS — J449 Chronic obstructive pulmonary disease, unspecified: Secondary | ICD-10-CM

## 2017-12-22 DIAGNOSIS — R05 Cough: Secondary | ICD-10-CM

## 2017-12-22 DIAGNOSIS — M7989 Other specified soft tissue disorders: Secondary | ICD-10-CM

## 2017-12-22 DIAGNOSIS — R1310 Dysphagia, unspecified: Secondary | ICD-10-CM | POA: Diagnosis not present

## 2017-12-22 DIAGNOSIS — R059 Cough, unspecified: Secondary | ICD-10-CM

## 2017-12-22 DIAGNOSIS — R0602 Shortness of breath: Secondary | ICD-10-CM | POA: Diagnosis not present

## 2017-12-22 LAB — COMPREHENSIVE METABOLIC PANEL
ALT: 15 U/L (ref 0–53)
AST: 22 U/L (ref 0–37)
Albumin: 3 g/dL — ABNORMAL LOW (ref 3.5–5.2)
Alkaline Phosphatase: 98 U/L (ref 39–117)
BUN: 25 mg/dL — AB (ref 6–23)
CHLORIDE: 90 meq/L — AB (ref 96–112)
CO2: 31 mEq/L (ref 19–32)
Calcium: 8.4 mg/dL (ref 8.4–10.5)
Creatinine, Ser: 0.79 mg/dL (ref 0.40–1.50)
GFR: 97.82 mL/min (ref >60.00)
GLUCOSE: 112 mg/dL — AB (ref 70–99)
POTASSIUM: 4 meq/L (ref 3.5–5.1)
SODIUM: 126 meq/L — AB (ref 135–145)
TOTAL PROTEIN: 6.8 g/dL (ref 6.0–8.3)
Total Bilirubin: 0.3 mg/dL (ref 0.2–1.2)

## 2017-12-22 LAB — CBC WITH DIFFERENTIAL/PLATELET
BASOS ABS: 0 10*3/uL (ref 0.0–0.1)
Basophils Relative: 0.6 % (ref 0.0–3.0)
EOS ABS: 0.2 10*3/uL (ref 0.0–0.7)
Eosinophils Relative: 3.5 % (ref 0.0–5.0)
HCT: 29.5 % — ABNORMAL LOW (ref 39.0–52.0)
Hemoglobin: 10.1 g/dL — ABNORMAL LOW (ref 13.0–17.0)
LYMPHS ABS: 1.4 10*3/uL (ref 0.7–4.0)
Lymphocytes Relative: 19.9 % (ref 12.0–46.0)
MCHC: 34.2 g/dL (ref 30.0–36.0)
MCV: 93.7 fl (ref 78.0–100.0)
MONO ABS: 0.6 10*3/uL (ref 0.1–1.0)
MONOS PCT: 9.3 % (ref 3.0–12.0)
NEUTROS ABS: 4.5 10*3/uL (ref 1.4–7.7)
NEUTROS PCT: 66.7 % (ref 43.0–77.0)
PLATELETS: 214 10*3/uL (ref 150.0–400.0)
RBC: 3.14 Mil/uL — AB (ref 4.22–5.81)
RDW: 14.1 % (ref 11.5–15.5)
WBC: 6.8 10*3/uL (ref 4.0–10.5)

## 2017-12-22 NOTE — Progress Notes (Signed)
Subjective:    Patient ID: [name] Seller Sr., male    DOB: [birth year], 84 y.o.   MRN: [id number]  Synopsis: former patient of Dr. Gwenette Greet with COPD Smoked cigarettes for many years, from age 39 to age 73 1.5 ppd CXR/CT chest 2014:  Emphysematous changes with mild scarring, +dilated esophagus with A/F level.  PFT"s 08/2012:  Normal FEV1%, but FVC improved 17% with BD.  +++ airtrapping/ +FVL, no restriction, DLCO normal  Echo 09/2012:  Normal LV EF, diastolic dysfxn, mildly dilated LA, RV normal.  CT angio 09/2012:  No PE, small nodule posterior LUL, GG anterior lingula ?significance, scattered areas of atx.  No significant emphysematous changes or ISLD  HPI Chief Complaint  Patient presents with  . Follow-up   [name] [name] fell about 4 weeks ago and had a hip fracture.  This was repaired surgically.  While hospitalized he had recurrent episodes of hypoxemia in the setting of aspiration pneumonia.  He was found to have severe aspiration and a feeding tube was placed.  He has been maintain n.p.o. since then and has been moved to a nursing home where he has regular speech therapy evaluations.  His family tells me that as of yesterday they were present for the speech therapy evaluation and were able to visualize food going through his cords into his lungs.  So his nutrition has been maintained with a feeding tube.  He has not been taking guaifenesin routinely.  He has been taking Trelegy and albuterol regularly.  He was put on 2L O2 at the nursing home and his family says that he has been maintained on that dose fairly consistently ever since.  He has not had a leg swelling yet he continues to take his diuretic medicine.  He has not been taking azithromycin regularly.  He has been working with physical therapy and has been walking and doing other exercises fairly aggressively.   Past Medical History:  Diagnosis Date  . Anemia   . COPD (chronic obstructive pulmonary disease) (HCC)    Mild  .  HTN (hypertension)   . Hypothyroidism       Review of Systems  Constitutional: Negative for chills, fatigue and fever.  HENT: Negative for postnasal drip, rhinorrhea and sinus pressure.   Respiratory: Negative for cough, shortness of breath and wheezing.   Cardiovascular: Positive for leg swelling. Negative for chest pain and palpitations.       Objective:   Physical Exam  Vitals:   12/22/17 1413  BP: (!) 106/56  Pulse: 66  SpO2: 95%   2L [place]  Gen: chronically ill appearing HENT: OP clear, TM's clear, neck supple PULM: upper airway rhonchi but no wheezing, good air movement, normal percussion CV: RRR, no mgr, trace edema GI: BS+, soft, nontender Derm: no cyanosis or rash Psyche: normal mood and affect    Chest imaging: February 2019 chest x-ray images independently reviewed showing no pleural effusion or pulmonary edema, some chronic bronchitis changes noted  BMET    Component Value Date/Time   NA 140 09/23/2017 1139   K 4.2 09/23/2017 1139   CL 106 09/23/2017 1139   CO2 28 09/23/2017 1139   GLUCOSE 94 09/23/2017 1139   BUN 28 (H) 09/23/2017 1139   CREATININE 1.11 09/23/2017 1139   CREATININE 1.27 10/24/2012 1006   CALCIUM 8.8 09/23/2017 1139   GFRNONAA 63 (L) 10/10/2012 0440   GFRAA 73 (L) 10/10/2012 0440   Records from the nursing facility are available for me to review  today showing where he has been treated with Trelegy daily and azithromycin and is participating in rehab.    Assessment & Plan:  Cough - Plan: DG Chest 2 View  Leg swelling - Plan: CBC w/Diff, Comp Met (CMET)  Shortness of breath - Plan: CBC w/Diff, Comp Met (CMET)  COPD, severe (HCC)  Dysphagia, unspecified type  Discussion: Though it has been a difficult month for Mr. Spiering it sounds as if he is progressing somewhat with his physical therapy.  Today what concerns me is that his cough seems to be worse and I think this is because he is not taking guaifenesin or his azithromycin.   Fortunately he is not wheezing and he does not appear volume overloaded on exam.  Plan: Leg swelling: Decrease torsemide to 20 mg twice a week, I will arrange for Monday and Thursday dosing We will check a kidney function test today  Dysphagia: Continue NPO and speech therapy evaluation Continue tube feeding through PEG  COPD with recurrent exacerbations: Resume daily azithromycin 250 mg daily Continue Trelegy Use albuterol as needed for chest tightness wheezing or shortness of breath We will check a chest x-ray today Resume guaifenesin 200 mg twice a day scheduled doses and as needed  Chronic respiratory failure with hypoxemia: For now I think you need to continue using 2 L nasal cannula, however I am going to ask the nursing home to check your oxygen on a daily basis and try to wean it off to maintain an O2 saturation greater than 90% We will check a chest x-ray today  Chronic anemia: We will check a CBC today (blood test to check your blood count)  Follow-up with me or a nurse practitioner in 3 weeks    Current Outpatient Medications:  .  albuterol (PROVENTIL) (5 MG/ML) 0.5% nebulizer solution, Take 0.5 mLs (2.5 mg total) by nebulization every 6 (six) hours as needed for wheezing or shortness of breath., Disp: 90 mL, Rfl: 3 .  aspirin 325 MG tablet, Take 325 mg by mouth daily., Disp: , Rfl:  .  Calcium Carbonate-Vitamin D (CALCIUM-VITAMIN D) 500-200 MG-UNIT tablet, Take 1 tablet by mouth daily. , Disp: , Rfl:  .  Ferrous Sulfate (IRON) 28 MG TABS, Take 1 tablet by mouth daily., Disp: , Rfl:  .  guaiFENesin (MUCINEX) 600 MG 12 hr tablet, Take 600 mg by mouth daily., Disp: , Rfl:  .  levothyroxine (SYNTHROID, LEVOTHROID) 100 MCG tablet, Take 100 mcg by mouth daily., Disp: , Rfl:  .  Multiple Vitamin (MULTIVITAMIN) tablet, Take 1 tablet by mouth daily., Disp: , Rfl:  .  Nutritional Supplements (FEEDING SUPPLEMENT, JEVITY 1.5 CAL,) LIQD, Place 270 mLs into feeding tube every 6  (six) hours., Disp: , Rfl:  .  omeprazole (PRILOSEC) 40 MG capsule, TAKE 1 CAPSULE BY MOUTH ONCE DAILY, Disp: 90 capsule, Rfl: 1 .  Respiratory Therapy Supplies (FLUTTER) DEVI, Use as directed, Disp: 1 each, Rfl: 0 .  torsemide (DEMADEX) 20 MG tablet, Take 20 mg by mouth daily., Disp: , Rfl: 2 .  TRELEGY ELLIPTA 100-62.5-25 MCG/INH AEPB, INHALE 1 PUFF ONCE DAILY, Disp: 60 each, Rfl: 11 .  verapamil (CALAN) 120 MG tablet, Take 120 mg by mouth 3 (three) times daily., Disp: , Rfl:  .  azithromycin (ZITHROMAX) 250 MG tablet, Take 1 tablet (250 mg total) by mouth daily. (Patient not taking: Reported on 12/22/2017), Disp: 30 tablet, Rfl: 3

## 2017-12-22 NOTE — Patient Instructions (Signed)
Leg swelling: Decrease torsemide to 20 mg twice a week, I will arrange for Monday and Thursday dosing We will check a kidney function test today  COPD with recurrent exacerbations: Resume daily azithromycin 250 mg daily Continue Trelegy Use albuterol as needed for chest tightness wheezing or shortness of breath We will check a chest x-ray today Resume guaifenesin 200 mg twice a day scheduled doses and as needed  Chronic respiratory failure with hypoxemia: For now I think you need to continue using 2 L nasal cannula, however I am going to ask the nursing home to check your oxygen on a daily basis and try to wean it off to maintain an O2 saturation greater than 90% We will check a chest x-ray today  Chronic anemia: We will check a CBC today (blood test to check your blood count)  Follow-up with me or a nurse practitioner in 3 weeks

## 2017-12-27 ENCOUNTER — Telehealth: Payer: Self-pay | Admitting: Pulmonary Disease

## 2017-12-27 NOTE — Telephone Encounter (Signed)
Called and spoke with [name] [name] verifying the neb sol pt has been prescribed and went over with her the instructions stated per BQ.  [name] [name] expressed understanding. Nothing further needed.

## 2017-12-28 DIAGNOSIS — Z96649 Presence of unspecified artificial hip joint: Secondary | ICD-10-CM | POA: Insufficient documentation

## 2018-01-12 ENCOUNTER — Encounter: Payer: Self-pay | Admitting: Primary Care

## 2018-01-12 ENCOUNTER — Ambulatory Visit: Payer: Medicare HMO | Admitting: Primary Care

## 2018-01-12 ENCOUNTER — Other Ambulatory Visit (INDEPENDENT_AMBULATORY_CARE_PROVIDER_SITE_OTHER): Payer: Medicare HMO

## 2018-01-12 VITALS — BP 108/62 | HR 62 | Ht 66.0 in | Wt 135.0 lb

## 2018-01-12 DIAGNOSIS — R2681 Unsteadiness on feet: Secondary | ICD-10-CM

## 2018-01-12 DIAGNOSIS — I10 Essential (primary) hypertension: Secondary | ICD-10-CM | POA: Diagnosis not present

## 2018-01-12 DIAGNOSIS — E871 Hypo-osmolality and hyponatremia: Secondary | ICD-10-CM | POA: Insufficient documentation

## 2018-01-12 DIAGNOSIS — I5189 Other ill-defined heart diseases: Secondary | ICD-10-CM | POA: Diagnosis not present

## 2018-01-12 DIAGNOSIS — J432 Centrilobular emphysema: Secondary | ICD-10-CM

## 2018-01-12 LAB — CBC WITH DIFFERENTIAL/PLATELET
BASOS ABS: 0.1 10*3/uL (ref 0.0–0.1)
Basophils Relative: 1 % (ref 0.0–3.0)
EOS ABS: 0.2 10*3/uL (ref 0.0–0.7)
Eosinophils Relative: 2.5 % (ref 0.0–5.0)
HEMATOCRIT: 32.3 % — AB (ref 39.0–52.0)
HEMOGLOBIN: 11 g/dL — AB (ref 13.0–17.0)
LYMPHS PCT: 21 % (ref 12.0–46.0)
Lymphs Abs: 1.6 10*3/uL (ref 0.7–4.0)
MCHC: 33.9 g/dL (ref 30.0–36.0)
MCV: 95.5 fl (ref 78.0–100.0)
Monocytes Absolute: 0.7 10*3/uL (ref 0.1–1.0)
Monocytes Relative: 8.6 % (ref 3.0–12.0)
Neutro Abs: 5.2 10*3/uL (ref 1.4–7.7)
Neutrophils Relative %: 66.9 % (ref 43.0–77.0)
Platelets: 201 10*3/uL (ref 150.0–400.0)
RBC: 3.38 Mil/uL — ABNORMAL LOW (ref 4.22–5.81)
RDW: 14.7 % (ref 11.5–15.5)
WBC: 7.8 10*3/uL (ref 4.0–10.5)

## 2018-01-12 LAB — COMPREHENSIVE METABOLIC PANEL
ALBUMIN: 3.5 g/dL (ref 3.5–5.2)
ALK PHOS: 77 U/L (ref 39–117)
ALT: 12 U/L (ref 0–53)
AST: 18 U/L (ref 0–37)
BUN: 28 mg/dL — AB (ref 6–23)
CHLORIDE: 93 meq/L — AB (ref 96–112)
CO2: 31 mEq/L (ref 19–32)
CREATININE: 0.78 mg/dL (ref 0.40–1.50)
Calcium: 8.9 mg/dL (ref 8.4–10.5)
GFR: 99.25 mL/min (ref >60.00)
Glucose, Bld: 74 mg/dL (ref 70–99)
Potassium: 4.6 mEq/L (ref 3.5–5.1)
SODIUM: 129 meq/L — AB (ref 135–145)
TOTAL PROTEIN: 7.2 g/dL (ref 6.0–8.3)
Total Bilirubin: 0.4 mg/dL (ref 0.2–1.2)

## 2018-01-12 LAB — BRAIN NATRIURETIC PEPTIDE: Pro B Natriuretic peptide (BNP): 123 pg/mL — ABNORMAL HIGH (ref 0.0–100.0)

## 2018-01-12 NOTE — Assessment & Plan Note (Addendum)
-   Sodium 126 (12/22/17) - No nausea, vomiting or diarrhea  - Checking labs today - Could be r/t diuretic use  - Also may want to check if patient is receiving free water with tube feedings or not

## 2018-01-12 NOTE — Assessment & Plan Note (Addendum)
-   Stable; no evidence of fluid vol overload. No leg/ankle swelling. Weight down, 135lb standing scale.  - Continues Torsemide twice a week (Consider changing to PRN leg swelling or weight >140lbs once labs resulted and reviewed) - Checking labs with BNP today

## 2018-01-12 NOTE — Assessment & Plan Note (Signed)
-   Continues physical therapy, progressing slowly.  - Working on Hotel manager - Using walker

## 2018-01-12 NOTE — Patient Instructions (Addendum)
Decrease Verapamil 80mg  TID through PEG tube (check BP twice daily with manual cuff, hold if SBP less than <100)  Check and see if getting free water through PEG tube with feedings  Continue mucinex, azithromycin and Trelegy  Torsemide twice a week (consider changing to as needed once we review labs)  Continue physical therapy, working on balance training  [place] to stay on RA, maintain oxygen level >90%   Flutter valve 3 times a day   FU in 3-4 weeks with Dr. [place] Bells (or [name] Np)  Notify if develops fever >100, shortness of breath, increased sputum production with color.   Labs today

## 2018-01-12 NOTE — Assessment & Plan Note (Deleted)
-   Continues physical therapy, progressing slowly.  - Working on Hotel manager - Using walker

## 2018-01-12 NOTE — Assessment & Plan Note (Addendum)
-   BP 108/62, 118/58 - Decrease Verapamil 80mg  TID (previously taking 120mg  TID) - Check manual BP twice a day and hold if SBP<100

## 2018-01-12 NOTE — Progress Notes (Signed)
@Patient  ID: [name] Seller Sr., male    DOB: [birth year], 84 y.o.   MRN: [id number]  Chief Complaint  Patient presents with  . Follow-up    Coughing up Mucus clear to grey. He is doing better  now that he is back on medication.    Referring provider: Renaldo Reel, DO  HPI: 84 year old male, former smoker (37.5 pack years).  PMH COPD, chronic respiratory failure, chronic cough, diastolic heart failure, solitary pulmonary nodule, anemia, aspiration.  Patient of Dr. Lake Bells, last seen 7/10 for cough.    12/22/17 [name] [name] fell about 4 weeks ago and had a hip fracture.  This was repaired surgically.  While hospitalized he had recurrent episodes of hypoxemia in the setting of aspiration pneumonia.  He was found to have severe aspiration and a feeding tube was placed.  He has been maintain n.p.o. since then and has been moved to a nursing home where he has regular speech therapy evaluations.  His family tells me that as of yesterday they were present for the speech therapy evaluation and were able to visualize food going through his cords into his lungs.  So his nutrition has been maintained with a feeding tube.  On 2 L oxygen in nursing home. He has not been taking guaifenesin and azithromycin regularly.  Working with physical therapy.  Discussion: Though it has been a difficult month for [name] [name] it sounds as if he is progressing somewhat with his physical therapy.  Today what concerns me is that his cough seems to be worse and I think this is because he is not taking guaifenesin or his azithromycin.  Fortunately he is not wheezing and he does not appear volume overloaded on exam.  Plan:  Continues Trelegy, resume mucinex and azithromycin 250mg  daily for COPD exac.CXR showing lower lung volumes with superimposed chronic lung base scarring. No acute cardiopulmonary abnormality identified. Maintain 2L home oxygen for resp failure, nursing staff to check daily and maintain >90%. Torsemide  decreased 20mg  twice a week for leg swelling. NPO with speech eval and tube feedings through PEG.   Testing: CXR/CT chest 2014:  Emphysematous changes with mild scarring, +dilated esophagus with A/F level.  PFT"s 08/2012:  Normal FEV1%, but FVC improved 17% with BD.  +++ airtrapping/ +FVL, no restriction, DLCO normal  Echo 09/2012:  Normal LV EF, diastolic dysfxn, mildly dilated LA, RV normal.  CT angio 09/2012:  No PE, small nodule posterior LUL, GG anterior lingula ?significance, scattered areas of atx.  No significant emphysematous changes or ISLD  01/12/2018 Presents today for 2 week fu cough with his daughter. Recently moved into nursing home. Doing better. Still coughing up clear to grey mucus. No shortness of breath, off oxygen. Reports that he uses flutter valve and IS daily.  Continues Torsemide twice a week, weight in office 135lbs on standing scale. No ankle swelling. Patient was taking diuretic at home when needed. BP lower in office today. Continues physical therapy, working on Hotel manager. Using walker and getting out of bed into chair.   Allergies  Allergen Reactions  . Codeine Nausea And Vomiting    Immunization History  Administered Date(s) Administered  . Influenza Split 02/15/2012, 02/13/2014, 05/03/2015  . Influenza, High Dose Seasonal PF 02/28/2016, 04/12/2017  . Influenza,inj,Quad PF,6+ Mos 03/08/2013  . Pneumococcal Polysaccharide-23 06/15/2010    Past Medical History:  Diagnosis Date  . Anemia   . COPD (chronic obstructive pulmonary disease) (HCC)    Mild  . HTN (hypertension)   .  Hypothyroidism     Tobacco History: Social History   Tobacco Use  Smoking Status Former Smoker  . Packs/day: 1.50  . Years: 25.00  . Pack years: 37.50  . Types: Cigarettes  . Last attempt to quit: 06/16/1971  . Years since quitting: 46.6  Smokeless Tobacco Never Used  Tobacco Comment   started at 84 years old   Counseling given: Not Answered Comment: started at 84  years old   Outpatient Medications Prior to Visit  Medication Sig Dispense Refill  . albuterol (PROVENTIL) (5 MG/ML) 0.5% nebulizer solution Take 0.5 mLs (2.5 mg total) by nebulization every 6 (six) hours as needed for wheezing or shortness of breath. 90 mL 3  . aspirin 325 MG tablet Take 325 mg by mouth daily.    Marland Kitchen azithromycin (ZITHROMAX) 250 MG tablet Take 1 tablet (250 mg total) by mouth daily. 30 tablet 3  . Calcium Carbonate-Vitamin D (CALCIUM-VITAMIN D) 500-200 MG-UNIT tablet Take 1 tablet by mouth daily.     . Ferrous Sulfate (IRON) 28 MG TABS Take 1 tablet by mouth daily.    Marland Kitchen guaiFENesin (MUCINEX) 600 MG 12 hr tablet Take 600 mg by mouth daily.    Marland Kitchen levothyroxine (SYNTHROID, LEVOTHROID) 100 MCG tablet Take 100 mcg by mouth daily.    . Multiple Vitamin (MULTIVITAMIN) tablet Take 1 tablet by mouth daily.    . Nutritional Supplements (FEEDING SUPPLEMENT, JEVITY 1.5 CAL,) LIQD Place 270 mLs into feeding tube every 6 (six) hours.    Marland Kitchen omeprazole (PRILOSEC) 40 MG capsule TAKE 1 CAPSULE BY MOUTH ONCE DAILY 90 capsule 1  . Respiratory Therapy Supplies (FLUTTER) DEVI Use as directed 1 each 0  . torsemide (DEMADEX) 20 MG tablet Take 20 mg by mouth daily.  2  . TRELEGY ELLIPTA 100-62.5-25 MCG/INH AEPB INHALE 1 PUFF ONCE DAILY 60 each 11  . verapamil (CALAN) 120 MG tablet Take 120 mg by mouth 3 (three) times daily.     No facility-administered medications prior to visit.       Review of Systems  Review of Systems  Constitutional: Negative.   HENT: Negative.   Respiratory: Positive for cough. Negative for shortness of breath.   Cardiovascular: Negative for leg swelling.  Neurological: Negative for dizziness and light-headedness.   Physical Exam  BP 108/62 (BP Location: Left Arm, Cuff Size: Normal)   Pulse 62   Ht 5\' 6"  (1.676 m)   Wt 135 lb (61.2 kg)   SpO2 94%   BMI 21.79 kg/m  Physical Exam  Constitutional: He is oriented to person, place, and time. No distress.  Elderly  male, chronically ill appearing.  HENT:  Head: Normocephalic and atraumatic.  Mouth/Throat: Uvula is midline, oropharynx is clear and moist and mucous membranes are normal.  Eyes: EOM are normal.  Neck: Normal range of motion. Neck supple.  Cardiovascular: Normal rate and regular rhythm.  Pulmonary/Chest: Effort normal. He has no wheezes.  Scattered rhonchi, clears with cough. Good air movement. No resp distress. O2 sat 94% RA.   Abdominal: Soft. Normal appearance. He exhibits no distension. There is no tenderness.  Neurological: He is alert and oriented to person, place, and time.  Skin: Skin is warm, dry and intact. No rash noted.  Psychiatric: He has a normal mood and affect. His speech is normal and behavior is normal. Thought content normal. Cognition and memory are normal.     Lab Results:  CBC    Component Value Date/Time   WBC 6.8 12/22/2017 1503  RBC 3.14 (L) 12/22/2017 1503   HGB 10.1 (L) 12/22/2017 1503   HCT 29.5 (L) 12/22/2017 1503   PLT 214.0 12/22/2017 1503   MCV 93.7 12/22/2017 1503   MCH 29.7 10/07/2012 0536   MCHC 34.2 12/22/2017 1503   RDW 14.1 12/22/2017 1503   LYMPHSABS 1.4 12/22/2017 1503   MONOABS 0.6 12/22/2017 1503   EOSABS 0.2 12/22/2017 1503   BASOSABS 0.0 12/22/2017 1503    BMET    Component Value Date/Time   NA 126 (L) 12/22/2017 1503   K 4.0 12/22/2017 1503   CL 90 (L) 12/22/2017 1503   CO2 31 12/22/2017 1503   GLUCOSE 112 (H) 12/22/2017 1503   BUN 25 (H) 12/22/2017 1503   CREATININE 0.79 12/22/2017 1503   CREATININE 1.27 10/24/2012 1006   CALCIUM 8.4 12/22/2017 1503   GFRNONAA 63 (L) 10/10/2012 0440   GFRAA 73 (L) 10/10/2012 0440    BNP No results found for: BNP  ProBNP    Component Value Date/Time   PROBNP 120.0 (H) 09/23/2017 1139    Imaging: Dg Chest 2 View  Result Date: 12/23/2017 CLINICAL DATA:  84 year old male with cough and congestion for 5 days. EXAM: CHEST - 2 VIEW COMPARISON:  Chest radiograph 08/10/2017 and  earlier. FINDINGS: Mildly lower lung volumes compared to February but improved ventilation at the right lung base with interval regressed patchy opacity. Chronic curvilinear scarring at both lung bases and chronic blunting of the costophrenic angles. Tortuous thoracic aorta. Stable cardiac size and mediastinal contours. Visualized tracheal air column is within normal limits. No pneumothorax, pulmonary edema or acute pulmonary opacity. No acute osseous abnormality identified. Negative visible bowel gas pattern. Cholecystectomy clips. IMPRESSION: Lower lung volumes with superimposed chronic lung base scarring. No acute cardiopulmonary abnormality identified. Electronically Signed   By: Genevie Ann M.D.   On: 12/23/2017 08:19     Assessment & Plan:   COPD (chronic obstructive pulmonary disease) with emphysema ([place]) - Stable; no evidence of acute exacerbation. Off home oxygen, maintain >90% - Continue Trelegy 1 puff daily  - Mucinex twice a day and as needed - Daily azithromycin 250mg   - Flutter valve three times a day - CXR 7/10 showing lower lung volumes with superimposed chronic lung base scarring. No acute cardiopulmonary abnormality identified.   Diastolic dysfunction - Stable; no evidence of fluid vol overload. No leg/ankle swelling. Weight down, 135lb standing scale.  - Continues Torsemide twice a week (Consider changing to PRN leg swelling or weight >140lbs once labs resulted and reviewed) - Checking labs with BNP today   Unsteady gait - Continues physical therapy, progressing slowly.  - Working on Hotel manager - Using walker   HTN (hypertension) - BP 108/62, 118/58 - Decrease Verapamil 80mg  TID (previously taking 120mg  TID) - Check manual BP twice a day and hold if SBP<100  Hyponatremia - Sodium 126 (12/22/17) - No nausea, vomiting or diarrhea  - Checking labs today - Could be r/t diuretic use  - Also may want to check if patient is receiving free water with tube feedings or  not         Martyn Ehrich, NP 01/12/2018

## 2018-01-12 NOTE — Assessment & Plan Note (Addendum)
-   Stable; no evidence of acute exacerbation. Off home oxygen, maintain >90% - Continue Trelegy 1 puff daily  - Mucinex twice a day and as needed - Daily azithromycin 250mg   - Flutter valve three times a day - CXR 7/10 showing lower lung volumes with superimposed chronic lung base scarring. No acute cardiopulmonary abnormality identified.

## 2018-01-13 ENCOUNTER — Telehealth: Payer: Self-pay | Admitting: Primary Care

## 2018-01-13 NOTE — Telephone Encounter (Signed)
Sounds great, thanks.

## 2018-01-13 NOTE — Telephone Encounter (Signed)
Dr. Lake Bells,  I saw Mr. Scow yesterday for 2 week fu cough. Patient feels better, still has cough with clear to grey mucus. He is taking mucinex and azithromycin daily. He looked well, off home oxygen. LS with rhonchi but clears with cough. You decreased his Torsemide to twice a week. No ankle swelling or sob. Weight was 135lb on standing scale.  There was concern regarding his sodium level, I rechecked labs and it was mildly improved at 129 (126). ProBNP is stable at 123 (120 three months ago). He did not appear fluid overloaded.   Plan decreased Torsemide to weekly dosing on fridays. He is getting free water through peg 122ml q4hrs. I also decreased his his Verapamil to 80mg  tid because his BP was on the lower side of normal. He will FU with you in 3-4 weeks. Will need labs

## 2018-01-13 NOTE — Progress Notes (Signed)
Reviewed, agree 

## 2018-01-19 ENCOUNTER — Ambulatory Visit: Payer: Medicare HMO | Admitting: Pulmonary Disease

## 2018-01-31 ENCOUNTER — Telehealth: Payer: Self-pay | Admitting: Pulmonary Disease

## 2018-01-31 DIAGNOSIS — J432 Centrilobular emphysema: Secondary | ICD-10-CM

## 2018-01-31 NOTE — Telephone Encounter (Signed)
Spoke with pt's daughter [name], requesting a rx for a hospital bed with handrails.   Pt is still in nursing home, but daughter is working toward getting things ready for pt when he returns home.  Pt receives O2 through [place].  Pt's insurance also works with [employer], [employer].  Pt's daughter would like to switch to either [employer] or [employer] if possible.  BQ please advise if ok to order hospital bed.  Thanks!

## 2018-02-01 NOTE — Telephone Encounter (Signed)
OK by me 

## 2018-02-01 NOTE — Telephone Encounter (Signed)
Spoke with pt's daughter, [name]. She is aware that BQ is okay with ordering the pt a hospital bed. [name] requested to have this order go to [facility]. Order has been placed. Nothing further was needed.

## 2018-02-04 ENCOUNTER — Telehealth: Payer: Self-pay | Admitting: Pulmonary Disease

## 2018-02-04 NOTE — Telephone Encounter (Signed)
Forms have been located and given to [facility] for completion.   Forwarding to BJT to follow up on signature from BQ.

## 2018-02-04 NOTE — Telephone Encounter (Signed)
Form signed by BQ and faxed back to [place] at [facility].  Original form placed in scan folder.  Nothing further needed.

## 2018-02-10 ENCOUNTER — Encounter: Payer: Self-pay | Admitting: Pulmonary Disease

## 2018-02-10 ENCOUNTER — Other Ambulatory Visit (INDEPENDENT_AMBULATORY_CARE_PROVIDER_SITE_OTHER): Payer: Medicare HMO

## 2018-02-10 ENCOUNTER — Telehealth: Payer: Self-pay | Admitting: Pulmonary Disease

## 2018-02-10 ENCOUNTER — Ambulatory Visit: Payer: Medicare HMO | Admitting: Pulmonary Disease

## 2018-02-10 VITALS — BP 126/66 | HR 66 | Ht 66.0 in | Wt 135.4 lb

## 2018-02-10 DIAGNOSIS — J432 Centrilobular emphysema: Secondary | ICD-10-CM

## 2018-02-10 DIAGNOSIS — I5189 Other ill-defined heart diseases: Secondary | ICD-10-CM

## 2018-02-10 DIAGNOSIS — M7989 Other specified soft tissue disorders: Secondary | ICD-10-CM

## 2018-02-10 DIAGNOSIS — E871 Hypo-osmolality and hyponatremia: Secondary | ICD-10-CM

## 2018-02-10 LAB — BASIC METABOLIC PANEL
BUN: 11 mg/dL (ref 6–23)
CALCIUM: 8.7 mg/dL (ref 8.4–10.5)
CHLORIDE: 99 meq/L (ref 96–112)
CO2: 28 meq/L (ref 19–32)
Creatinine, Ser: 0.77 mg/dL (ref 0.40–1.50)
GFR: 100.72 mL/min (ref >60.00)
GLUCOSE: 96 mg/dL (ref 70–99)
POTASSIUM: 4.2 meq/L (ref 3.5–5.1)
SODIUM: 132 meq/L — AB (ref 135–145)

## 2018-02-10 NOTE — Telephone Encounter (Signed)
Notes recorded by Juanito Doom, MD on 02/10/2018 at 12:07 PM EDT BJ, Please let the patient know this was OK Thanks, B ------------------------------------------------ Spoke with pt's daughter, [name]. She is aware of results. Nothing further was needed.

## 2018-02-10 NOTE — Patient Instructions (Signed)
Dysphagia: Continue to follow the speech therapy recommendations Continue to work with speech therapy  Diastolic dysfunction: Continue torsemide twice a week as you are doing Monitor your weight every day, the goal weight is 135 pounds We will check a basic metabolic panel today to make sure your sodium and kidney function is okay  COPD with recurrent exacerbations: Continue Trelegy daily Continue Mucinex twice a day Continue daily azithromycin Continue flutter valve 3 times a day  Follow-up in 6 weeks

## 2018-02-10 NOTE — Progress Notes (Signed)
Subjective:    Patient ID: [name] Seller Sr., male    DOB: [birth year], 84 y.o.   MRN: [id number]  Synopsis: former patient of Dr. Gwenette Greet with COPD Smoked cigarettes for many years, from age 34 to age 89 1.5 ppd CXR/CT chest 2014:  Emphysematous changes with mild scarring, +dilated esophagus with A/F level.  PFT"s 08/2012:  Normal FEV1%, but FVC improved 17% with BD.  +++ airtrapping/ +FVL, no restriction, DLCO normal  Echo 09/2012:  Normal LV EF, diastolic dysfxn, mildly dilated LA, RV normal.  CT angio 09/2012:  No PE, small nodule posterior LUL, GG anterior lingula ?significance, scattered areas of atx.  No significant emphysematous changes or ISLD [name] [name] developed significant   HPI Chief Complaint  Patient presents with  . Follow-up    pt states he is doing well, not exerting as much as usual at group home.  does not prod cough with yellow mucus.     [name] [name] says that he has been coughing up some mucus in the morning which is typically yellow but apart from that he says his breathing has been okay.  He is no longer needing to use oxygen when he is walking around.  His leg swelling has improved.  His weight is been stable at 135 pounds.  He continues to use tube feedings at night.  They are feeding him throughout the day and using speech therapy.   Past Medical History:  Diagnosis Date  . Anemia   . COPD (chronic obstructive pulmonary disease) (HCC)    Mild  . HTN (hypertension)   . Hypothyroidism       Review of Systems  Constitutional: Negative for chills, fatigue and fever.  HENT: Negative for postnasal drip, rhinorrhea and sinus pressure.   Respiratory: Negative for cough, shortness of breath and wheezing.   Cardiovascular: Positive for leg swelling. Negative for chest pain and palpitations.       Objective:   Physical Exam  Vitals:   02/10/18 0922  BP: 126/66  Pulse: 66  SpO2: 93%  Weight: 135 lb 6.4 oz (61.4 kg)  Height: 5\' 6"  (1.676 m)   RA  Gen:  chronically ill appearing, in wheelchair HENT: OP clear, TM's clear, neck supple PULM: few coarse crackles in R base, normal percussion CV: RRR, no mgr, trace edema GI: BS+, soft, nontender Derm: no cyanosis or rash Psyche: normal mood and affect     Chest imaging: February 2019 chest x-ray images independently reviewed showing no pleural effusion or pulmonary edema, some chronic bronchitis changes noted  BMET    Component Value Date/Time   NA 129 (L) 01/12/2018 1613   K 4.6 01/12/2018 1613   CL 93 (L) 01/12/2018 1613   CO2 31 01/12/2018 1613   GLUCOSE 74 01/12/2018 1613   BUN 28 (H) 01/12/2018 1613   CREATININE 0.78 01/12/2018 1613   CREATININE 1.27 10/24/2012 1006   CALCIUM 8.9 01/12/2018 1613   GFRNONAA 63 (L) 10/10/2012 0440   GFRAA 73 (L) 10/10/2012 0440   Records from his nursing home reviewed where he has been receiving Trelegy daily, daily azithromycin and speech therapy.     Assessment & Plan:  Diastolic dysfunction - Plan: Basic Metabolic Panel (BMET)  Leg swelling - Plan: Basic Metabolic Panel (BMET)  Centrilobular emphysema ([place])  Hyponatremia  Discussion: [name] [name] has a stable weight today compared to last month and he has no leg swelling.  He is off of oxygen.  He continues to cough up mucus  which I think will be something that he does for every day of his life.  So from my standpoint he is actually doing fairly well.  He has done well with speech therapy.  Plan: Dysphagia: Continue to follow the speech therapy recommendations Continue to work with speech therapy  Diastolic dysfunction: Continue torsemide twice a week as you are doing Monitor your weight every day, the goal weight is 135 pounds We will check a basic metabolic panel today to make sure your sodium and kidney function is okay  COPD with recurrent exacerbations: Continue Trelegy daily Continue Mucinex twice a day Continue daily azithromycin Continue flutter valve 3 times a  day  Follow-up in 6 weeks   Current Outpatient Medications:  .  albuterol (PROVENTIL) (5 MG/ML) 0.5% nebulizer solution, Take 0.5 mLs (2.5 mg total) by nebulization every 6 (six) hours as needed for wheezing or shortness of breath., Disp: 90 mL, Rfl: 3 .  aspirin 325 MG tablet, Take 325 mg by mouth daily., Disp: , Rfl:  .  azithromycin (ZITHROMAX) 250 MG tablet, Take 1 tablet (250 mg total) by mouth daily., Disp: 30 tablet, Rfl: 3 .  Calcium Carbonate-Vitamin D (CALCIUM-VITAMIN D) 500-200 MG-UNIT tablet, Take 1 tablet by mouth daily. , Disp: , Rfl:  .  Ferrous Sulfate (IRON) 28 MG TABS, Take 1 tablet by mouth daily., Disp: , Rfl:  .  guaiFENesin (MUCINEX) 600 MG 12 hr tablet, Take 600 mg by mouth daily., Disp: , Rfl:  .  levothyroxine (SYNTHROID, LEVOTHROID) 100 MCG tablet, Take 100 mcg by mouth daily., Disp: , Rfl:  .  Multiple Vitamin (MULTIVITAMIN) tablet, Take 1 tablet by mouth daily., Disp: , Rfl:  .  Nutritional Supplements (FEEDING SUPPLEMENT, JEVITY 1.5 CAL,) LIQD, Place 270 mLs into feeding tube every 6 (six) hours., Disp: , Rfl:  .  omeprazole (PRILOSEC) 40 MG capsule, TAKE 1 CAPSULE BY MOUTH ONCE DAILY, Disp: 90 capsule, Rfl: 1 .  Respiratory Therapy Supplies (FLUTTER) DEVI, Use as directed, Disp: 1 each, Rfl: 0 .  torsemide (DEMADEX) 20 MG tablet, Take 20 mg by mouth once a week. , Disp: , Rfl: 2 .  TRELEGY ELLIPTA 100-62.5-25 MCG/INH AEPB, INHALE 1 PUFF ONCE DAILY, Disp: 60 each, Rfl: 11

## 2018-03-09 ENCOUNTER — Other Ambulatory Visit (INDEPENDENT_AMBULATORY_CARE_PROVIDER_SITE_OTHER): Payer: Medicare HMO

## 2018-03-09 ENCOUNTER — Telehealth: Payer: Self-pay | Admitting: Pulmonary Disease

## 2018-03-09 DIAGNOSIS — R8290 Unspecified abnormal findings in urine: Secondary | ICD-10-CM

## 2018-03-09 DIAGNOSIS — R410 Disorientation, unspecified: Secondary | ICD-10-CM | POA: Diagnosis not present

## 2018-03-09 LAB — URINALYSIS
BILIRUBIN URINE: NEGATIVE
KETONES UR: NEGATIVE
LEUKOCYTES UA: NEGATIVE
NITRITE: NEGATIVE
Specific Gravity, Urine: 1.01 (ref 1.000–1.030)
Total Protein, Urine: NEGATIVE
Urine Glucose: NEGATIVE
Urobilinogen, UA: 0.2 (ref 0.0–1.0)
pH: 6.5 (ref 5.0–8.0)

## 2018-03-09 NOTE — Telephone Encounter (Signed)
Per pt's granddaughter, [name] [name]. She is going to get a clean catch urine sample. Spoke with Dr. Lake Bells, he is okay with ordering a UA. Order has been placed for UA and culture. Will await results.

## 2018-03-09 NOTE — Telephone Encounter (Signed)
lmtcb x1 for pt. 

## 2018-03-09 NOTE — Telephone Encounter (Signed)
Spoke with pt's grand daughter, Lynelle Smoke. New order for UA and culture placed as STAT due to pt's confusion. Nothing further needed at this time.

## 2018-03-10 ENCOUNTER — Ambulatory Visit (INDEPENDENT_AMBULATORY_CARE_PROVIDER_SITE_OTHER): Payer: Medicare HMO | Admitting: Adult Health

## 2018-03-10 ENCOUNTER — Encounter: Payer: Self-pay | Admitting: Adult Health

## 2018-03-10 ENCOUNTER — Ambulatory Visit (INDEPENDENT_AMBULATORY_CARE_PROVIDER_SITE_OTHER)
Admission: RE | Admit: 2018-03-10 | Discharge: 2018-03-10 | Disposition: A | Discharge status: Home / Self Care | Payer: Medicare HMO | Source: Ambulatory Visit | Attending: Adult Health | Admitting: Adult Health

## 2018-03-10 VITALS — BP 114/72 | HR 70 | Temp 98.3°F | Ht 66.0 in | Wt 131.8 lb

## 2018-03-10 DIAGNOSIS — J181 Lobar pneumonia, unspecified organism: Secondary | ICD-10-CM

## 2018-03-10 DIAGNOSIS — J432 Centrilobular emphysema: Secondary | ICD-10-CM | POA: Diagnosis not present

## 2018-03-10 DIAGNOSIS — T17908D Unspecified foreign body in respiratory tract, part unspecified causing other injury, subsequent encounter: Secondary | ICD-10-CM

## 2018-03-10 DIAGNOSIS — J189 Pneumonia, unspecified organism: Secondary | ICD-10-CM | POA: Insufficient documentation

## 2018-03-10 LAB — URINE CULTURE
MICRO NUMBER:: 91152557
SPECIMEN QUALITY:: ADEQUATE

## 2018-03-10 MED ORDER — AMOXICILLIN-POT CLAVULANATE 875-125 MG PO TABS: 1.0000 | ORAL_TABLET | Freq: Two times a day (BID) | ORAL | 0 refills | Status: DC

## 2018-03-10 NOTE — Progress Notes (Signed)
@Patient  ID: [name] Seller Sr., male    DOB: 06-30-1924, 84 y.o.   MRN: [id number]  Chief Complaint  Patient presents with  . Acute Visit    COPD     Referring provider: Renaldo Reel, DO  HPI: 84 year-old male former smoker followed for emphysema/COPD , Diastolic CHF  Medical Hx significant for   Synopsis: former patient of Dr. Gwenette Greet with COPD Smoked cigarettes for many years, from age 51 to age 5 1.5 ppd CXR/CT chest 2014: Emphysematous changes with mild scarring, +dilated esophagus with A/F level.  PFT"s 08/2012: Normal FEV1%, but FVC improved 17% with BD. +++ airtrapping/ +FVL, no restriction, DLCO normal  Echo 09/2012: Normal LV EF, diastolic dysfxn, mildly dilated LA, RV normal.  CT angio 09/2012: No PE, small nodule posterior LUL, GG anterior lingula ?significance, scattered areas of atx. No significant emphysematous changes or ISLD 07/2015 Pseudomonas (pan sensitive )  Sputum cx and AFB neg on 12/23/15 .    03/10/2018 Acute OV : COPD Patient presents for an acute office visit.  Patient is accompanied by his daughter and son.  He lives with his son.  Over the last few days he has had increased confusion especially at nighttime.  Some confusion during the daytime with intermittent agitation.  Patient has a good appetite.  No fever.  Does have a daily cough with thick mucus.  No significant change. Patient has COPD with emphysema.  Is on Trelegy inhaler at home. Patient had a hip fracture in July of this year.  Postop he has significant dysphagia.  He had a feeding tube but is been working closely with speech therapy.  And has been weaned off of tube feeds and is been taking in regular diet for the last few weeks.  Denies any dysphasia or choking episodes.  We discussed aspiration precautions.   Chest x-ray shows patchy consolidation along the right base with a small pleural effusion.     Allergies  Allergen Reactions  . Codeine Nausea And Vomiting     Immunization History  Administered Date(s) Administered  . Influenza Split 02/15/2012, 02/13/2014, 05/03/2015  . Influenza, High Dose Seasonal PF 02/28/2016, 04/12/2017  . Influenza,inj,Quad PF,6+ Mos 03/08/2013  . Pneumococcal Polysaccharide-23 06/15/2010    Past Medical History:  Diagnosis Date  . Anemia   . COPD (chronic obstructive pulmonary disease) (HCC)    Mild  . HTN (hypertension)   . Hypothyroidism     Tobacco History: Social History   Tobacco Use  Smoking Status Former Smoker  . Packs/day: 1.50  . Years: 25.00  . Pack years: 37.50  . Types: Cigarettes  . Last attempt to quit: 06/16/1971  . Years since quitting: 46.7  Smokeless Tobacco Never Used  Tobacco Comment   started at 84 years old   Counseling given: Not Answered Comment: started at 84 years old   Outpatient Medications Prior to Visit  Medication Sig Dispense Refill  . albuterol (PROVENTIL) (5 MG/ML) 0.5% nebulizer solution Take 0.5 mLs (2.5 mg total) by nebulization every 6 (six) hours as needed for wheezing or shortness of breath. 90 mL 3  . aspirin 325 MG tablet Take 325 mg by mouth daily.    Marland Kitchen azithromycin (ZITHROMAX) 250 MG tablet Take 1 tablet (250 mg total) by mouth daily. 30 tablet 3  . Calcium Carbonate-Vitamin D (CALCIUM-VITAMIN D) 500-200 MG-UNIT tablet Take 1 tablet by mouth daily.     . Ferrous Sulfate (IRON) 28 MG TABS Take 1 tablet by mouth daily.    Marland Kitchen  guaiFENesin (MUCINEX) 600 MG 12 hr tablet Take 600 mg by mouth daily.    Marland Kitchen levothyroxine (SYNTHROID, LEVOTHROID) 100 MCG tablet Take 100 mcg by mouth daily.    . Multiple Vitamin (MULTIVITAMIN) tablet Take 1 tablet by mouth daily.    . Nutritional Supplements (FEEDING SUPPLEMENT, JEVITY 1.5 CAL,) LIQD Place 270 mLs into feeding tube every 6 (six) hours.    Marland Kitchen omeprazole (PRILOSEC) 40 MG capsule TAKE 1 CAPSULE BY MOUTH ONCE DAILY 90 capsule 1  . Respiratory Therapy Supplies (FLUTTER) DEVI Use as directed 1 each 0  . TRELEGY ELLIPTA  100-62.5-25 MCG/INH AEPB INHALE 1 PUFF ONCE DAILY 60 each 11  . verapamil (CALAN) 80 MG tablet Take 80 mg by mouth 3 (three) times daily.    Marland Kitchen torsemide (DEMADEX) 20 MG tablet Take 20 mg by mouth as needed.   2   No facility-administered medications prior to visit.      Review of Systems  Constitutional:   No  weight loss, night sweats,  Fevers, chills, + fatigue, or  lassitude.  HEENT:   No headaches,  Difficulty swallowing,  Tooth/dental problems, or  Sore throat,                No sneezing, itching, ear ache, nasal congestion, post nasal drip,   CV:  No chest pain,  Orthopnea, PND, swelling in lower extremities, anasarca, dizziness, palpitations, syncope.   GI  No heartburn, indigestion, abdominal pain, nausea, vomiting, diarrhea, change in bowel habits, loss of appetite, bloody stools.   Resp: .  No chest wall deformity  Skin: no rash or lesions.  GU: no dysuria, change in color of urine, no urgency or frequency.  No flank pain, no hematuria   MS:  No joint pain or swelling.  No decreased range of motion.  No back pain.    Physical Exam  BP 114/72 (BP Location: Left Arm, Cuff Size: Normal)   Pulse 70   Temp 98.3 F (36.8 C) (Oral)   Ht 5\' 6"  (1.676 m)   Wt 131 lb 12.8 oz (59.8 kg)   SpO2 94%   BMI 21.27 kg/m   GEN: A/Ox3; pleasant , NAD, elderly in wheelchair   HEENT:  [place]/AT,  EACs-clear, TMs-wnl, NOSE-clear, THROAT-clear, no lesions, no postnasal drip or exudate noted.   NECK:  Supple w/ fair ROM; no JVD; normal carotid impulses w/o bruits; no thyromegaly or nodules palpated; no lymphadenopathy.    RESP scattered rhonchi no accessory muscle use, no dullness to percussion  CARD:  RRR, no m/r/g, no peripheral edema, pulses intact, no cyanosis or clubbing.  GI:   Soft & nt; nml bowel sounds; no organomegaly or masses detected.   Musco: Warm bil, no deformities or joint swelling noted.   Neuro: alert, no focal deficits noted.    Skin: Warm, no lesions or  rashes    Lab Results:    Imaging: Dg Chest 2 View  Result Date: 03/10/2018 CLINICAL DATA:  Cough and congestion for 3 days EXAM: CHEST - 2 VIEW COMPARISON:  December 22, 2017 FINDINGS: The heart size and mediastinal contours are stable. The aorta is tortuous. Patchy consolidation of right lung base with minimal right pleural effusion is noted. The left lung is clear. The visualized skeletal structures are unremarkable. IMPRESSION: Patchy consolidation with minimal right pleural effusion. This may be due to atelectasis but underlying pneumonia is not excluded. Electronically Signed   By: Abelardo Diesel M.D.   On: 03/10/2018 16:20  Assessment & Plan:   COPD (chronic obstructive pulmonary disease) with emphysema (HCC) Mild flare with Right sided PNA - he is alert and appropriate in the ofifce , taking in good fluids and food.  Will tx OP with abx, if not improving or worsens with need admission   Plan  Patient Instructions  Augmentin 875mg  Twice daily  For 7 days , take w/ food.  Eat yogurt daily .  Hold azitrhomycin while on augmentin , once done restart Azithromycin.  Mucinex DM Twice daily  .As needed  Cough/congestion  Continue  on Flutter valve Three times a day   Aspiration precautions .  Please contact office for sooner follow up if symptoms do not improve or worsen or seek emergency care  Follow up Dr. Lake Bells or Parrett NP in 1 week and As needed           Pneumonia Right basilar PNA - will tx OP with abx,  . He is taking in food and fluids  If not repsonding or worsens with confusion or stops taking in food /fluids   Plan  Patient Instructions  Augmentin 875mg  Twice daily  For 7 days , take w/ food.  Eat yogurt daily .  Hold azitrhomycin while on augmentin , once done restart Azithromycin.  Mucinex DM Twice daily  .As needed  Cough/congestion  Continue  on Flutter valve Three times a day   Aspiration precautions .  Please contact office for sooner follow up if  symptoms do not improve or worsen or seek emergency care  Follow up Dr. Lake Bells or Parrett NP in 1 week and As needed              Rexene Edison, NP 03/10/2018

## 2018-03-10 NOTE — Assessment & Plan Note (Signed)
Right basilar PNA - will tx OP with abx,  . He is taking in food and fluids  If not repsonding or worsens with confusion or stops taking in food /fluids   Plan  Patient Instructions  Augmentin 875mg  Twice daily  For 7 days , take w/ food.  Eat yogurt daily .  Hold azitrhomycin while on augmentin , once done restart Azithromycin.  Mucinex DM Twice daily  .As needed  Cough/congestion  Continue  on Flutter valve Three times a day   Aspiration precautions .  Please contact office for sooner follow up if symptoms do not improve or worsen or seek emergency care  Follow up Dr. Lake Bells or Herman Mell NP in 1 week and As needed

## 2018-03-10 NOTE — Progress Notes (Signed)
Reviewed, agree 

## 2018-03-10 NOTE — Patient Instructions (Addendum)
Augmentin 875mg  Twice daily  For 7 days , take w/ food.  Eat yogurt daily .  Hold azitrhomycin while on augmentin , once done restart Azithromycin.  Mucinex DM Twice daily  .As needed  Cough/congestion  Continue  on Flutter valve Three times a day   Aspiration precautions .  Please contact office for sooner follow up if symptoms do not improve or worsen or seek emergency care  Follow up Dr. Lake Bells or Parrett NP in 1 week and As needed

## 2018-03-10 NOTE — Assessment & Plan Note (Signed)
Mild flare with Right sided PNA - he is alert and appropriate in the ofifce , taking in good fluids and food.  Will tx OP with abx, if not improving or worsens with need admission   Plan  Patient Instructions  Augmentin 875mg  Twice daily  For 7 days , take w/ food.  Eat yogurt daily .  Hold azitrhomycin while on augmentin , once done restart Azithromycin.  Mucinex DM Twice daily  .As needed  Cough/congestion  Continue  on Flutter valve Three times a day   Aspiration precautions .  Please contact office for sooner follow up if symptoms do not improve or worsen or seek emergency care  Follow up Dr. Lake Bells or Jahan Friedlander NP in 1 week and As needed

## 2018-03-17 ENCOUNTER — Ambulatory Visit (INDEPENDENT_AMBULATORY_CARE_PROVIDER_SITE_OTHER): Payer: Medicare HMO | Admitting: Adult Health

## 2018-03-17 ENCOUNTER — Ambulatory Visit (INDEPENDENT_AMBULATORY_CARE_PROVIDER_SITE_OTHER)
Admission: RE | Admit: 2018-03-17 | Discharge: 2018-03-17 | Disposition: A | Discharge status: Home / Self Care | Payer: Medicare HMO | Source: Ambulatory Visit | Attending: Adult Health | Admitting: Adult Health

## 2018-03-17 ENCOUNTER — Encounter: Payer: Self-pay | Admitting: Adult Health

## 2018-03-17 VITALS — BP 102/62 | HR 69 | Ht 66.0 in | Wt 135.0 lb

## 2018-03-17 DIAGNOSIS — J181 Lobar pneumonia, unspecified organism: Secondary | ICD-10-CM

## 2018-03-17 DIAGNOSIS — J432 Centrilobular emphysema: Secondary | ICD-10-CM

## 2018-03-17 DIAGNOSIS — J189 Pneumonia, unspecified organism: Secondary | ICD-10-CM

## 2018-03-17 NOTE — Progress Notes (Signed)
@Patient  ID: [name] Seller Sr., male    DOB: June 30, 1924, 84 y.o.   MRN: [id number]  Chief Complaint  Patient presents with  . Follow-up    PNA     Referring provider: Renaldo Reel, DO  HPI: 84 year-old male former smoker followed for emphysema/COPD , Diastolic CHF    Synopsis: former patient of Dr. Gwenette Greet with COPD Smoked cigarettes for many years, from age 61 to age 56 1.5 ppd CXR/CT chest 2014: Emphysematous changes with mild scarring, +dilated esophagus with A/F level.  PFT"s 08/2012: Normal FEV1%, but FVC improved 17% with BD. +++ airtrapping/ +FVL, no restriction, DLCO normal  Echo 09/2012: Normal LV EF, diastolic dysfxn, mildly dilated LA, RV normal.  CT angio 09/2012: No PE, small nodule posterior LUL, GG anterior lingula ?significance, scattered areas of atx. No significant emphysematous changes or ISLD 07/2015 Pseudomonas (pan sensitive )  Sputum cx and AFB neg on 12/23/15 .  03/17/2018 Follow up : COPD /PNA  Patient presents for a one-week follow-up for pneumonia.  Patient was seen last visit for increased confusion.  Found to have a right lower lobe pneumonia he was treated with a seven-day course of Augmentin.  Patient is on his last day of therapy.  Patient is accompanied by his family.  Says he is feeling much better.  Confusion has resolved.  Patient is sleeping well.  Appetite is good. Chest x-ray today shows resolution of effusion and consolidation on the right.  Chronic changes are stable.   Patient has underlying COPD. patient remains on Trelegy, daily azithromycin.   Allergies  Allergen Reactions  . Codeine Nausea And Vomiting    Immunization History  Administered Date(s) Administered  . Influenza Split 02/15/2012, 02/13/2014, 05/03/2015  . Influenza, High Dose Seasonal PF 02/28/2016, 04/12/2017  . Influenza,inj,Quad PF,6+ Mos 03/08/2013  . Pneumococcal Polysaccharide-23 06/15/2010    Past Medical History:  Diagnosis Date  . Anemia   . COPD  (chronic obstructive pulmonary disease) (HCC)    Mild  . HTN (hypertension)   . Hypothyroidism     Tobacco History: Social History   Tobacco Use  Smoking Status Former Smoker  . Packs/day: 1.50  . Years: 25.00  . Pack years: 37.50  . Types: Cigarettes  . Last attempt to quit: 06/16/1971  . Years since quitting: 46.7  Smokeless Tobacco Never Used  Tobacco Comment   started at 84 years old   Counseling given: Not Answered Comment: started at 84 years old   Outpatient Medications Prior to Visit  Medication Sig Dispense Refill  . albuterol (PROVENTIL) (5 MG/ML) 0.5% nebulizer solution Take 0.5 mLs (2.5 mg total) by nebulization every 6 (six) hours as needed for wheezing or shortness of breath. 90 mL 3  . aspirin 325 MG tablet Take 325 mg by mouth daily.    Marland Kitchen azithromycin (ZITHROMAX) 250 MG tablet Take 1 tablet (250 mg total) by mouth daily. 30 tablet 3  . Calcium Carbonate-Vitamin D (CALCIUM-VITAMIN D) 500-200 MG-UNIT tablet Take 1 tablet by mouth daily.     . Ferrous Sulfate (IRON) 28 MG TABS Take 1 tablet by mouth daily.    Marland Kitchen guaiFENesin (MUCINEX) 600 MG 12 hr tablet Take 600 mg by mouth daily.    Marland Kitchen levothyroxine (SYNTHROID, LEVOTHROID) 100 MCG tablet Take 100 mcg by mouth daily.    . Multiple Vitamin (MULTIVITAMIN) tablet Take 1 tablet by mouth daily.    Marland Kitchen omeprazole (PRILOSEC) 40 MG capsule TAKE 1 CAPSULE BY MOUTH ONCE DAILY 90 capsule  1  . Respiratory Therapy Supplies (FLUTTER) DEVI Use as directed 1 each 0  . torsemide (DEMADEX) 20 MG tablet Take 20 mg by mouth as needed.   2  . TRELEGY ELLIPTA 100-62.5-25 MCG/INH AEPB INHALE 1 PUFF ONCE DAILY 60 each 11  . verapamil (CALAN) 80 MG tablet Take 80 mg by mouth 3 (three) times daily.    . Nutritional Supplements (FEEDING SUPPLEMENT, JEVITY 1.5 CAL,) LIQD Place 270 mLs into feeding tube every 6 (six) hours.    Marland Kitchen amoxicillin-clavulanate (AUGMENTIN) 875-125 MG tablet Take 1 tablet by mouth 2 (two) times daily for 7 days. 14 tablet 0    No facility-administered medications prior to visit.      Review of Systems  Constitutional:   No  weight loss, night sweats,  Fevers, chills, + fatigue, or  lassitude.  HEENT:   No headaches,  Difficulty swallowing,  Tooth/dental problems, or  Sore throat,                No sneezing, itching, ear ache, nasal congestion, post nasal drip,   CV:  No chest pain,  Orthopnea, PND, swelling in lower extremities, anasarca, dizziness, palpitations, syncope.   GI  No heartburn, indigestion, abdominal pain, nausea, vomiting, diarrhea, change in bowel habits, loss of appetite, bloody stools.   Resp: .  No chest wall deformity  Skin: no rash or lesions.  GU: no dysuria, change in color of urine, no urgency or frequency.  No flank pain, no hematuria   MS:  No joint pain or swelling.  No decreased range of motion.  No back pain.    Physical Exam  BP 102/62 (BP Location: Left Arm, Cuff Size: Normal)   Pulse 69   Ht 5\' 6"  (1.676 m)   Wt 135 lb (61.2 kg)   SpO2 94%   BMI 21.79 kg/m   GEN: A/Ox3; pleasant , NAD, elderly    HEENT:  [place]/AT,  EACs-clear, TMs-wnl, NOSE-clear, THROAT-clear, no lesions, no postnasal drip or exudate noted.   NECK:  Supple w/ fair ROM; no JVD; normal carotid impulses w/o bruits; no thyromegaly or nodules palpated; no lymphadenopathy.    RESP  Few trace rhonchi , improved aeration , . no accessory muscle use, no dullness to percussion  CARD:  RRR, no m/r/g, no peripheral edema, pulses intact, no cyanosis or clubbing.  GI:   Soft & nt; nml bowel sounds; no organomegaly or masses detected.   Musco: Warm bil, no deformities or joint swelling noted.   Neuro: alert, no focal deficits noted.    Skin: Warm, no lesions or rashes    Lab Results:  CBC  BMET  BNP No results found for: BNP   Imaging: Dg Chest 2 View  Result Date: 03/17/2018 CLINICAL DATA:  Follow up pneumonia. EXAM: CHEST - 2 VIEW COMPARISON:  Chest radiograph March 10, 2018  FINDINGS: Cardiac silhouette is normal. Mildly tortuous calcified aorta. Bandlike densities bilateral lung bases. Flattened hemidiaphragms. No pleural effusion or focal consolidation. No pneumothorax. Soft tissue planes and included osseous structures are non suspicious. Surgical clips in the included right abdomen compatible with cholecystectomy. IMPRESSION: Bibasilar atelectasis/scarring. Aortic Atherosclerosis (ICD10-I70.0). Emphysema (ICD10-J43.9). Electronically Signed   By: Elon Alas M.D.   On: 03/17/2018 15:59   Dg Chest 2 View  Result Date: 03/10/2018 CLINICAL DATA:  Cough and congestion for 3 days EXAM: CHEST - 2 VIEW COMPARISON:  December 22, 2017 FINDINGS: The heart size and mediastinal contours are stable. The aorta is tortuous.  Patchy consolidation of right lung base with minimal right pleural effusion is noted. The left lung is clear. The visualized skeletal structures are unremarkable. IMPRESSION: Patchy consolidation with minimal right pleural effusion. This may be due to atelectasis but underlying pneumonia is not excluded. Electronically Signed   By: Abelardo Diesel M.D.   On: 03/10/2018 16:20     Assessment & Plan:   COPD (chronic obstructive pulmonary disease) with emphysema (HCC) COPD -appears stable . Continue on TRELEGY and Resume daily Azithromycin  Cont w/ pulmonary hygiene   Plan  Patient Instructions  Finish Augmentin today .  Then resume Azithromycin. . Continue on current regimen .  Mucinex DM Twice daily  .As needed  Cough/congestion  Continue  on Flutter valve Three times a day   Aspiration precautions .  Please contact office for sooner follow up if symptoms do not improve or worsen or seek emergency care  Follow up Dr. Lake Bells 6- 8 weeks and As needed          Pneumonia Right sided PNA -clinically improved.  CXR shows clearance  Aspiration precautions   Plan  Patient Instructions  Finish Augmentin today .  Then resume Azithromycin. . Continue  on current regimen .  Mucinex DM Twice daily  .As needed  Cough/congestion  Continue  on Flutter valve Three times a day   Aspiration precautions .  Please contact office for sooner follow up if symptoms do not improve or worsen or seek emergency care  Follow up Dr. Lake Bells 6- 8 weeks and As needed        n      Rexene Edison, NP 03/17/2018

## 2018-03-17 NOTE — Assessment & Plan Note (Signed)
Right sided PNA -clinically improved.  CXR shows clearance  Aspiration precautions   Plan  Patient Instructions  Finish Augmentin today .  Then resume Azithromycin. . Continue on current regimen .  Mucinex DM Twice daily  .As needed  Cough/congestion  Continue  on Flutter valve Three times a day   Aspiration precautions .  Please contact office for sooner follow up if symptoms do not improve or worsen or seek emergency care  Follow up [name] [name] 6- 8 weeks and As needed        n

## 2018-03-17 NOTE — Patient Instructions (Addendum)
Finish Augmentin today .  Then resume Azithromycin. . Continue on current regimen .  Mucinex DM Twice daily  .As needed  Cough/congestion  Continue  on Flutter valve Three times a day   Aspiration precautions .  Please contact office for sooner follow up if symptoms do not improve or worsen or seek emergency care  Follow up Dr. Lake Bells 6- 8 weeks and As needed

## 2018-03-17 NOTE — Assessment & Plan Note (Signed)
COPD -appears stable . Continue on TRELEGY and Resume daily Azithromycin  Cont w/ pulmonary hygiene   Plan  Patient Instructions  Finish Augmentin today .  Then resume Azithromycin. . Continue on current regimen .  Mucinex DM Twice daily  .As needed  Cough/congestion  Continue  on Flutter valve Three times a day   Aspiration precautions .  Please contact office for sooner follow up if symptoms do not improve or worsen or seek emergency care  Follow up Dr. Lake Bells 6- 8 weeks and As needed

## 2018-03-17 NOTE — Progress Notes (Signed)
Reviewed, agree 

## 2018-03-22 ENCOUNTER — Other Ambulatory Visit: Payer: Self-pay

## 2018-03-22 ENCOUNTER — Emergency Department (HOSPITAL_COMMUNITY): Payer: Medicare HMO

## 2018-03-22 ENCOUNTER — Inpatient Hospital Stay (HOSPITAL_COMMUNITY)
Admission: EM | Admit: 2018-03-22 | Discharge: 2018-04-02 | DRG: 091 | Disposition: A | Discharge status: Home Health | Payer: Medicare HMO | Attending: Family Medicine | Admitting: Family Medicine

## 2018-03-22 ENCOUNTER — Encounter (HOSPITAL_COMMUNITY): Payer: Self-pay

## 2018-03-22 DIAGNOSIS — J9611 Chronic respiratory failure with hypoxia: Secondary | ICD-10-CM | POA: Diagnosis present

## 2018-03-22 DIAGNOSIS — G9340 Encephalopathy, unspecified: Secondary | ICD-10-CM | POA: Diagnosis present

## 2018-03-22 DIAGNOSIS — J439 Emphysema, unspecified: Secondary | ICD-10-CM | POA: Diagnosis present

## 2018-03-22 DIAGNOSIS — K5900 Constipation, unspecified: Secondary | ICD-10-CM | POA: Diagnosis present

## 2018-03-22 DIAGNOSIS — J432 Centrilobular emphysema: Secondary | ICD-10-CM

## 2018-03-22 DIAGNOSIS — G92 Toxic encephalopathy: Principal | ICD-10-CM | POA: Diagnosis present

## 2018-03-22 DIAGNOSIS — Z6821 Body mass index (BMI) 21.0-21.9, adult: Secondary | ICD-10-CM

## 2018-03-22 DIAGNOSIS — E860 Dehydration: Secondary | ICD-10-CM | POA: Diagnosis present

## 2018-03-22 DIAGNOSIS — Z79890 Hormone replacement therapy (postmenopausal): Secondary | ICD-10-CM

## 2018-03-22 DIAGNOSIS — E039 Hypothyroidism, unspecified: Secondary | ICD-10-CM | POA: Diagnosis present

## 2018-03-22 DIAGNOSIS — I5032 Chronic diastolic (congestive) heart failure: Secondary | ICD-10-CM | POA: Diagnosis present

## 2018-03-22 DIAGNOSIS — I10 Essential (primary) hypertension: Secondary | ICD-10-CM | POA: Diagnosis present

## 2018-03-22 DIAGNOSIS — T17908A Unspecified foreign body in respiratory tract, part unspecified causing other injury, initial encounter: Secondary | ICD-10-CM

## 2018-03-22 DIAGNOSIS — E43 Unspecified severe protein-calorie malnutrition: Secondary | ICD-10-CM | POA: Diagnosis present

## 2018-03-22 DIAGNOSIS — Z7982 Long term (current) use of aspirin: Secondary | ICD-10-CM

## 2018-03-22 DIAGNOSIS — G4709 Other insomnia: Secondary | ICD-10-CM | POA: Diagnosis present

## 2018-03-22 DIAGNOSIS — Z87891 Personal history of nicotine dependence: Secondary | ICD-10-CM

## 2018-03-22 DIAGNOSIS — Z8249 Family history of ischemic heart disease and other diseases of the circulatory system: Secondary | ICD-10-CM

## 2018-03-22 DIAGNOSIS — J449 Chronic obstructive pulmonary disease, unspecified: Secondary | ICD-10-CM | POA: Diagnosis present

## 2018-03-22 DIAGNOSIS — I130 Hypertensive heart and chronic kidney disease with heart failure and stage 1 through stage 4 chronic kidney disease, or unspecified chronic kidney disease: Secondary | ICD-10-CM | POA: Diagnosis present

## 2018-03-22 DIAGNOSIS — J9610 Chronic respiratory failure, unspecified whether with hypoxia or hypercapnia: Secondary | ICD-10-CM | POA: Diagnosis present

## 2018-03-22 DIAGNOSIS — R4702 Dysphasia: Secondary | ICD-10-CM | POA: Diagnosis present

## 2018-03-22 DIAGNOSIS — Z79899 Other long term (current) drug therapy: Secondary | ICD-10-CM

## 2018-03-22 DIAGNOSIS — R4182 Altered mental status, unspecified: Secondary | ICD-10-CM

## 2018-03-22 LAB — COMPREHENSIVE METABOLIC PANEL
ALBUMIN: 3.5 g/dL (ref 3.5–5.0)
ALK PHOS: 78 U/L (ref 38–126)
ALT: 23 U/L (ref 0–44)
ANION GAP: 9 (ref 5–15)
AST: 27 U/L (ref 15–41)
BILIRUBIN TOTAL: 0.7 mg/dL (ref 0.3–1.2)
BUN: 24 mg/dL — AB (ref 8–23)
CALCIUM: 9.3 mg/dL (ref 8.9–10.3)
CO2: 28 mmol/L (ref 22–32)
CREATININE: 0.88 mg/dL (ref 0.61–1.24)
Chloride: 106 mmol/L (ref 98–111)
GFR calc Af Amer: >60 mL/min (ref >60)
GFR calc non Af Amer: >60 mL/min (ref >60)
GLUCOSE: 91 mg/dL (ref 70–99)
Potassium: 4.3 mmol/L (ref 3.5–5.1)
SODIUM: 143 mmol/L (ref 135–145)
TOTAL PROTEIN: 7.4 g/dL (ref 6.5–8.1)

## 2018-03-22 LAB — BLOOD GAS, ARTERIAL
Acid-Base Excess: 2.1 mmol/L — ABNORMAL HIGH (ref 0.0–2.0)
BICARBONATE: 25.8 mmol/L (ref 20.0–28.0)
Drawn by: 30860
O2 Content: 2 L/min
O2 Saturation: 95.4 %
PATIENT TEMPERATURE: 98.6
PH ART: 7.438 (ref 7.350–7.450)
pCO2 arterial: 38.8 mmHg (ref 32.0–48.0)
pO2, Arterial: 79.6 mmHg — ABNORMAL LOW (ref 83.0–108.0)

## 2018-03-22 LAB — CBC WITH DIFFERENTIAL/PLATELET
Abs Immature Granulocytes: 0.02 10*3/uL (ref 0.00–0.07)
BASOS ABS: 0 10*3/uL (ref 0.0–0.1)
BASOS PCT: 1 %
Eosinophils Absolute: 0.3 10*3/uL (ref 0.0–0.5)
Eosinophils Relative: 3 %
HCT: 36.5 % — ABNORMAL LOW (ref 39.0–52.0)
Hemoglobin: 12 g/dL — ABNORMAL LOW (ref 13.0–17.0)
IMMATURE GRANULOCYTES: 0 %
Lymphocytes Relative: 20 %
Lymphs Abs: 1.7 10*3/uL (ref 0.7–4.0)
MCH: 31.3 pg (ref 26.0–34.0)
MCHC: 32.9 g/dL (ref 30.0–36.0)
MCV: 95.3 fL (ref 80.0–100.0)
MONOS PCT: 6 %
Monocytes Absolute: 0.5 10*3/uL (ref 0.1–1.0)
NEUTROS ABS: 5.8 10*3/uL (ref 1.7–7.7)
NEUTROS PCT: 70 %
NRBC: 0 % (ref 0.0–0.2)
PLATELETS: 156 10*3/uL (ref 150–400)
RBC: 3.83 MIL/uL — AB (ref 4.22–5.81)
RDW: 13 % (ref 11.5–15.5)
WBC: 8.3 10*3/uL (ref 4.0–10.5)

## 2018-03-22 LAB — URINALYSIS, ROUTINE W REFLEX MICROSCOPIC
Bilirubin Urine: NEGATIVE
GLUCOSE, UA: NEGATIVE mg/dL
KETONES UR: NEGATIVE mg/dL
Leukocytes, UA: NEGATIVE
Nitrite: NEGATIVE
PH: 7 (ref 5.0–8.0)
Protein, ur: NEGATIVE mg/dL
SPECIFIC GRAVITY, URINE: 1.011 (ref 1.005–1.030)

## 2018-03-22 MED ORDER — ENOXAPARIN SODIUM 40 MG/0.4ML ~~LOC~~ SOLN
40.0000 mg | Freq: Every day | SUBCUTANEOUS | Status: DC
  Administered 2018-03-22 – 2018-04-01 (×11): 40 mg via SUBCUTANEOUS
  Filled 2018-03-22 (×11): qty 0.4

## 2018-03-22 MED ORDER — BISACODYL 10 MG RE SUPP: 10.0000 mg | Freq: Every day | RECTAL | Status: DC | PRN

## 2018-03-22 MED ORDER — ALBUTEROL SULFATE (2.5 MG/3ML) 0.083% IN NEBU: 2.5000 mg | INHALATION_SOLUTION | Freq: Four times a day (QID) | RESPIRATORY_TRACT | Status: DC | PRN

## 2018-03-22 MED ORDER — ONDANSETRON HCL 4 MG PO TABS: 4.0000 mg | ORAL_TABLET | Freq: Four times a day (QID) | ORAL | Status: DC | PRN

## 2018-03-22 MED ORDER — FLEET ENEMA 7-19 GM/118ML RE ENEM: 1.0000 | ENEMA | Freq: Once | RECTAL | Status: DC | PRN

## 2018-03-22 MED ORDER — AZITHROMYCIN 250 MG PO TABS
250.0000 mg | ORAL_TABLET | Freq: Every day | ORAL | Status: DC
  Administered 2018-03-24 – 2018-03-25 (×2): 250 mg via ORAL
  Filled 2018-03-22 (×2): qty 1

## 2018-03-22 MED ORDER — LEVOTHYROXINE SODIUM 100 MCG PO TABS
100.0000 ug | ORAL_TABLET | Freq: Every day | ORAL | Status: DC
  Administered 2018-03-25 – 2018-03-28 (×3): 100 ug via ORAL
  Filled 2018-03-22 (×3): qty 1

## 2018-03-22 MED ORDER — ASPIRIN 325 MG PO TABS
325.0000 mg | ORAL_TABLET | Freq: Every day | ORAL | Status: DC
  Administered 2018-03-24 – 2018-03-28 (×5): 325 mg via ORAL
  Filled 2018-03-22 (×5): qty 1

## 2018-03-22 MED ORDER — VERAPAMIL HCL 80 MG PO TABS
80.0000 mg | ORAL_TABLET | Freq: Three times a day (TID) | ORAL | Status: DC
  Administered 2018-03-22 – 2018-03-28 (×12): 80 mg via ORAL
  Filled 2018-03-22 (×18): qty 1

## 2018-03-22 MED ORDER — HYDRALAZINE HCL 20 MG/ML IJ SOLN: 10.0000 mg | INTRAMUSCULAR | Status: DC | PRN

## 2018-03-22 MED ORDER — SODIUM CHLORIDE 0.9 % IV SOLN
INTRAVENOUS | Status: DC
  Administered 2018-03-22: via INTRAVENOUS

## 2018-03-22 MED ORDER — ACETAMINOPHEN 325 MG PO TABS: 650.0000 mg | ORAL_TABLET | Freq: Four times a day (QID) | ORAL | Status: DC | PRN

## 2018-03-22 MED ORDER — PANTOPRAZOLE SODIUM 40 MG PO TBEC
40.0000 mg | DELAYED_RELEASE_TABLET | Freq: Every day | ORAL | Status: DC
  Administered 2018-03-24 – 2018-03-26 (×3): 40 mg via ORAL
  Filled 2018-03-22 (×3): qty 1

## 2018-03-22 MED ORDER — GUAIFENESIN 100 MG/5ML PO SOLN: 200.0000 mg | Freq: Three times a day (TID) | ORAL | Status: DC | PRN

## 2018-03-22 MED ORDER — DOCUSATE SODIUM 100 MG PO CAPS
100.0000 mg | ORAL_CAPSULE | Freq: Two times a day (BID) | ORAL | Status: DC
  Administered 2018-03-22 – 2018-03-26 (×7): 100 mg via ORAL
  Filled 2018-03-22 (×8): qty 1

## 2018-03-22 MED ORDER — BUDESONIDE 0.25 MG/2ML IN SUSP
0.2500 mg | Freq: Two times a day (BID) | RESPIRATORY_TRACT | Status: DC
  Administered 2018-03-23 – 2018-04-02 (×20): 0.25 mg via RESPIRATORY_TRACT
  Filled 2018-03-22 (×21): qty 2

## 2018-03-22 MED ORDER — ADULT MULTIVITAMIN W/MINERALS CH
1.0000 | ORAL_TABLET | Freq: Every day | ORAL | Status: DC
  Administered 2018-03-24 – 2018-03-28 (×5): 1 via ORAL
  Filled 2018-03-22 (×5): qty 1

## 2018-03-22 MED ORDER — MAGNESIUM HYDROXIDE 400 MG/5ML PO SUSP
30.0000 mL | Freq: Every day | ORAL | Status: DC | PRN
  Administered 2018-03-22: 30 mL via ORAL
  Filled 2018-03-22: qty 30

## 2018-03-22 MED ORDER — ONDANSETRON HCL 4 MG/2ML IJ SOLN: 4.0000 mg | Freq: Four times a day (QID) | INTRAMUSCULAR | Status: DC | PRN

## 2018-03-22 MED ORDER — ACETAMINOPHEN 650 MG RE SUPP: 650.0000 mg | Freq: Four times a day (QID) | RECTAL | Status: DC | PRN

## 2018-03-22 MED ORDER — UMECLIDINIUM-VILANTEROL 62.5-25 MCG/INH IN AEPB
1.0000 | INHALATION_SPRAY | Freq: Every day | RESPIRATORY_TRACT | Status: DC
  Administered 2018-03-24 – 2018-03-31 (×6): 1 via RESPIRATORY_TRACT
  Filled 2018-03-22 (×2): qty 14

## 2018-03-22 MED ORDER — CALCIUM CARBONATE-VITAMIN D 500-200 MG-UNIT PO TABS
1.0000 | ORAL_TABLET | Freq: Every day | ORAL | Status: DC
  Administered 2018-03-24 – 2018-03-28 (×5): 1 via ORAL
  Filled 2018-03-22 (×5): qty 1

## 2018-03-22 MED ORDER — HALOPERIDOL LACTATE 5 MG/ML IJ SOLN: 1.0000 mg | Freq: Four times a day (QID) | INTRAMUSCULAR | Status: DC | PRN

## 2018-03-22 NOTE — ED Provider Notes (Signed)
[place] [facility] DEPT Provider Note   CSN: [id number] Arrival date & time: 03/22/18  1513     History   Chief Complaint Chief Complaint  Patient presents with  . Shortness of Breath    HPI [name] Middleton Sr. is a 84 y.o. male.  The history is provided by the patient. No language interpreter was used.  Shortness of Breath  This is a new problem. The average episode lasts 2 days. The problem occurs continuously.The problem has not changed since onset.Pertinent negatives include no fever. He has tried nothing for the symptoms. The treatment provided no relief. He has had prior hospitalizations.  Pt was treated on 10/3 for pneumonia.  Pt finished Rx for zithromax.  Pt's son reports pt became agitated Monday night.  They report pt has been confused since.  Pt has been eating and drinking.  They report pt has not slept since 4am on Monday. Pt has been hallucinating.  Pt has been picking at air.getting trapped between bedrails.  Son reports pt has had some confusion in evenings but nothing like this.    Past Medical History:  Diagnosis Date  . Anemia   . COPD (chronic obstructive pulmonary disease) (HCC)    Mild  . HTN (hypertension)   . Hypothyroidism     Patient Active Problem List   Diagnosis Date Noted  . Pneumonia 03/10/2018  . Unsteady gait 01/12/2018  . HTN (hypertension) 01/12/2018  . Hyponatremia 01/12/2018  . Aspiration into airway 02/17/2017  . Osteopenia 01/05/2017  . Anemia, iron deficiency 09/13/2015  . Fall 07/23/2015  . Herpes zoster 04/01/2015  . Normocytic normochromic anemia 12/25/2013  . Solitary pulmonary nodule 03/08/2013  . Diastolic dysfunction 56/21/3086  . Chronic cough 08/26/2012  . COPD (chronic obstructive pulmonary disease) with emphysema ([place]) 08/12/2012  . Chronic respiratory failure ([place]) 08/12/2012    Past Surgical History:  Procedure Laterality Date  . CATARACT EXTRACTION Bilateral   . GALLBLADDER SURGERY   02/21/2011  . OTHER SURGICAL HISTORY     finger right pointer--laceration repair        Home Medications    Prior to Admission medications   Medication Sig Start Date End Date Taking? Authorizing Provider  albuterol (PROVENTIL) (5 MG/ML) 0.5% nebulizer solution Take 0.5 mLs (2.5 mg total) by nebulization every 6 (six) hours as needed for wheezing or shortness of breath. 11/02/17  Yes Juanito Doom, MD  aspirin 325 MG tablet Take 325 mg by mouth daily.   Yes [provider]  azithromycin (ZITHROMAX) 250 MG tablet Take 1 tablet (250 mg total) by mouth daily. 11/19/17  Yes Juanito Doom, MD  Calcium Carbonate-Vitamin D (CALCIUM-VITAMIN D) 500-200 MG-UNIT tablet Take 1 tablet by mouth daily.    Yes [provider]  Ferrous Sulfate (IRON) 28 MG TABS Take 1 tablet by mouth daily.   Yes [provider]  guaiFENesin (ROBITUSSIN) 100 MG/5ML liquid Take 200 mg by mouth 3 (three) times daily as needed for cough.   Yes [provider]  levothyroxine (SYNTHROID, LEVOTHROID) 100 MCG tablet Take 100 mcg by mouth daily.   Yes [provider]  Multiple Vitamin (MULTIVITAMIN) tablet Take 1 tablet by mouth daily.   Yes [provider]  omeprazole (PRILOSEC) 40 MG capsule TAKE 1 CAPSULE BY MOUTH ONCE DAILY 07/19/17  Yes Noralee Space, MD  TRELEGY ELLIPTA 100-62.5-25 MCG/INH AEPB INHALE 1 PUFF ONCE DAILY Patient taking differently: Take 1 puff by mouth daily.  09/06/17  Yes McQuaid,  Ronie Spies, MD  verapamil (CALAN) 80 MG tablet Take 80 mg by mouth 3 (three) times daily.   Yes [provider]  Respiratory Therapy Supplies (FLUTTER) DEVI Use as directed 03/31/16   Parrett, Fonnie Mu, NP    Family History Family History  Problem Relation Age of Onset  . Other Father        enlarged heart  . Lung disease Mother   . COPD Sister   . Heart disease Sister        Pacemaker  . Lung cancer Sister   . Colon cancer Neg Hx     Social  History Social History   Tobacco Use  . Smoking status: Former Smoker    Packs/day: 1.50    Years: 25.00    Pack years: 37.50    Types: Cigarettes    Last attempt to quit: 06/16/1971    Years since quitting: 46.7  . Smokeless tobacco: Never Used  . Tobacco comment: started at 84 years old  Substance Use Topics  . Alcohol use: No  . Drug use: No     Allergies   Codeine   Review of Systems Review of Systems  Constitutional: Negative for fever.  Respiratory: Positive for shortness of breath.   All other systems reviewed and are negative.    Physical Exam Updated Vital Signs BP (!) 192/86   Pulse 75   Temp 99.4 F (37.4 C) (Rectal)   Resp 19   SpO2 97%   Physical Exam  Constitutional: He appears well-developed and well-nourished.  HENT:  Head: Normocephalic and atraumatic.  Cardiovascular: Normal rate.  Pulmonary/Chest: Effort normal. He has no decreased breath sounds.  Abdominal: Soft.  Musculoskeletal: Normal range of motion.       Right lower leg: Normal.       Left lower leg: Normal.  Neurological: He is alert.  Skin: Skin is warm.  Psychiatric: He has a normal mood and affect.     ED Treatments / Results  Labs (all labs ordered are listed, but only abnormal results are displayed) Labs Reviewed  CBC WITH DIFFERENTIAL/PLATELET - Abnormal; Notable for the following components:      Result Value   RBC 3.83 (*)    Hemoglobin 12.0 (*)    HCT 36.5 (*)    All other components within normal limits  COMPREHENSIVE METABOLIC PANEL - Abnormal; Notable for the following components:   BUN 24 (*)    All other components within normal limits  URINALYSIS, ROUTINE W REFLEX MICROSCOPIC - Abnormal; Notable for the following components:   Hgb urine dipstick SMALL (*)    Bacteria, UA RARE (*)    All other components within normal limits    EKG None  Radiology Dg Chest 2 View  Result Date: 03/22/2018 CLINICAL DATA:  Acute cough and weakness. EXAM: CHEST - 2  VIEW COMPARISON:  03/17/2018 and prior exams FINDINGS: This is a low volume study. Cardiomediastinal silhouette is unchanged. Mild bibasilar opacities again noted-likely atelectasis/scarring. No pleural effusion, definite airspace disease or pneumothorax. IMPRESSION: Low volume study with mild bibasilar opacities again noted-likely atelectasis/scarring. Electronically Signed   By: Margarette Canada M.D.   On: 03/22/2018 18:13   Ct Head Wo Contrast  Result Date: 03/22/2018 CLINICAL DATA:  Altered mental status. EXAM: CT HEAD WITHOUT CONTRAST TECHNIQUE: Contiguous axial images were obtained from the base of the skull through the vertex without intravenous contrast. COMPARISON:  CT scan of October 06, 2012. FINDINGS: Brain: Mild diffuse cortical atrophy is  noted. Mild chronic ischemic white matter disease is noted. No mass effect or midline shift is noted. Ventricular size is within normal limits. There is no evidence of mass lesion, hemorrhage or acute infarction. Vascular: No hyperdense vessel or unexpected calcification. Skull: Normal. Negative for fracture or focal lesion. Sinuses/Orbits: Opacification of left maxillary sinus is again noted consistent with chronic sinusitis. Other: None. IMPRESSION: Mild diffuse cortical atrophy. Mild chronic ischemic white matter disease. No acute intracranial abnormality seen. Electronically Signed   By: Marijo Conception, M.D.   On: 03/22/2018 19:54    Procedures Procedures (including critical care time)  Medications Ordered in ED Medications - No data to display   Initial Impression / Assessment and Plan / ED Course  I have reviewed the triage vital signs and the nursing notes.  Pertinent labs & imaging results that were available during my care of the patient were reviewed by me and considered in my medical decision making (see chart for details).     Dr. Roderic Palau in to see and examine.  ABg obtained. Chest xray no pneumonia.  Labs reviewed.   Family reports pt had a  similar episode when hospitalized after hip surgery that they were told was due to lack of nutrition.  Pt had a peg tube placed and symptoms resolved and pt was able to go home.  He is currently on a regular diet and did eat and drink normally today.   I spoke to Dr. Blima Singer who will see and admit.   Final Clinical Impressions(s) / ED Diagnoses   Final diagnoses:  Acute encephalopathy    ED Discharge Orders    None       Sidney Ace 03/22/18 2142    Milton Ferguson, MD 03/22/18 2336

## 2018-03-22 NOTE — ED Notes (Signed)
ED TO INPATIENT HANDOFF REPORT  Name/Age/Gender [name] Seller Sr. 84 y.o. male  Code Status    Code Status Orders  (From admission, onward)         Start     Ordered   03/22/18 2200  Full code  Continuous     03/22/18 2202        Code Status History    Date Active Date Inactive Code Status Order ID Comments User Context   10/06/2012 1623 10/10/2012 1546 Full Code [id number]  Parrett, Fonnie Mu, NP Inpatient      Home/SNF/Other Home  Chief Complaint AMS   Level of Care/Admitting Diagnosis ED Disposition    ED Disposition Condition [facility]: [facility] [100102]  Level of Care: Med-Surg [16]  Diagnosis: Acute encephalopathy [269485]  Admitting Physician: Vianne Bulls [[id number]]  Attending Physician: Vianne Bulls [[id number]]  PT Class (Do Not Modify): Observation [104]  PT Acc Code (Do Not Modify): Observation [10022]       Medical History Past Medical History:  Diagnosis Date  . Anemia   . COPD (chronic obstructive pulmonary disease) (HCC)    Mild  . HTN (hypertension)   . Hypothyroidism     Allergies Allergies  Allergen Reactions  . Codeine Nausea And Vomiting    IV Location/Drains/Wounds Patient Lines/Drains/Airways Status   Active Line/Drains/Airways    Name:   Placement date:   Placement time:   Site:   Days:   Peripheral IV 03/22/18 Left Hand   03/22/18    1505    Hand   less than 1          Labs/Imaging Results for orders placed or performed during the hospital encounter of 03/22/18 (from the past 48 hour(s))  Urinalysis, Routine w reflex microscopic     Status: Abnormal   Collection Time: 03/22/18  4:09 PM  Result Value Ref Range   Color, Urine YELLOW YELLOW   APPearance CLEAR CLEAR   Specific Gravity, Urine 1.011 1.005 - 1.030   pH 7.0 5.0 - 8.0   Glucose, UA NEGATIVE NEGATIVE mg/dL   Hgb urine dipstick SMALL (A) NEGATIVE   Bilirubin Urine NEGATIVE NEGATIVE   Ketones, ur NEGATIVE NEGATIVE  mg/dL   Protein, ur NEGATIVE NEGATIVE mg/dL   Nitrite NEGATIVE NEGATIVE   Leukocytes, UA NEGATIVE NEGATIVE   RBC / HPF 0-5 0 - 5 RBC/hpf   WBC, UA 0-5 0 - 5 WBC/hpf   Bacteria, UA RARE (A) NONE SEEN   Squamous Epithelial / LPF 0-5 0 - 5   Mucus PRESENT     Comment: Performed at [facility], [place] [address], [place], [place] [address]  CBC with Differential/Platelet     Status: Abnormal   Collection Time: 03/22/18  4:25 PM  Result Value Ref Range   WBC 8.3 4.0 - 10.5 K/uL   RBC 3.83 (L) 4.22 - 5.81 MIL/uL   Hemoglobin 12.0 (L) 13.0 - 17.0 g/dL   HCT 36.5 (L) 39.0 - 52.0 %   MCV 95.3 80.0 - 100.0 fL   MCH 31.3 26.0 - 34.0 pg   MCHC 32.9 30.0 - 36.0 g/dL   RDW 13.0 11.5 - 15.5 %   Platelets 156 150 - 400 K/uL   nRBC 0.0 0.0 - 0.2 %   Neutrophils Relative % 70 %   Neutro Abs 5.8 1.7 - 7.7 K/uL   Lymphocytes Relative 20 %   Lymphs Abs 1.7 0.7 - 4.0  K/uL   Monocytes Relative 6 %   Monocytes Absolute 0.5 0.1 - 1.0 K/uL   Eosinophils Relative 3 %   Eosinophils Absolute 0.3 0.0 - 0.5 K/uL   Basophils Relative 1 %   Basophils Absolute 0.0 0.0 - 0.1 K/uL   Immature Granulocytes 0 %   Abs Immature Granulocytes 0.02 0.00 - 0.07 K/uL    Comment: Performed at [facility], [place] [address], [place], [place] [address]  Comprehensive metabolic panel     Status: Abnormal   Collection Time: 03/22/18  4:25 PM  Result Value Ref Range   Sodium 143 135 - 145 mmol/L   Potassium 4.3 3.5 - 5.1 mmol/L   Chloride 106 98 - 111 mmol/L   CO2 28 22 - 32 mmol/L   Glucose, Bld 91 70 - 99 mg/dL   BUN 24 (H) 8 - 23 mg/dL   Creatinine, Ser 0.88 0.61 - 1.24 mg/dL   Calcium 9.3 8.9 - 10.3 mg/dL   Total Protein 7.4 6.5 - 8.1 g/dL   Albumin 3.5 3.5 - 5.0 g/dL   AST 27 15 - 41 U/L   ALT 23 0 - 44 U/L   Alkaline Phosphatase 78 38 - 126 U/L   Total Bilirubin 0.7 0.3 - 1.2 mg/dL   GFR calc non Af Amer >60 >60 mL/min   GFR calc Af Amer >60 >60 mL/min    Comment:  (NOTE) The eGFR has been calculated using the CKD EPI equation. This calculation has not been validated in all clinical situations. eGFR's persistently <60 mL/min signify possible Chronic Kidney Disease.    Anion gap 9 5 - 15    Comment: Performed at [facility], [place] [address], [place], [place] [address]  Blood gas, arterial ([facility] & [facility] ONLY)     Status: Abnormal   Collection Time: 03/22/18  8:13 PM  Result Value Ref Range   O2 Content 2.0 L/min   Delivery systems NASAL CANNULA    pH, Arterial 7.438 7.350 - 7.450   pCO2 arterial 38.8 32.0 - 48.0 mmHg   pO2, Arterial 79.6 (L) 83.0 - 108.0 mmHg   Bicarbonate 25.8 20.0 - 28.0 mmol/L   Acid-Base Excess 2.1 (H) 0.0 - 2.0 mmol/L   O2 Saturation 95.4 %   Patient temperature 98.6    Collection site RIGHT RADIAL    Drawn by [phone]    Sample type ARTERIAL DRAW    Allens test (pass/fail) PASS PASS    Comment: Performed at [facility], [place] [address], [place], [place] [address]   Dg Chest 2 View  Result Date: 03/22/2018 CLINICAL DATA:  Acute cough and weakness. EXAM: CHEST - 2 VIEW COMPARISON:  03/17/2018 and prior exams FINDINGS: This is a low volume study. Cardiomediastinal silhouette is unchanged. Mild bibasilar opacities again noted-likely atelectasis/scarring. No pleural effusion, definite airspace disease or pneumothorax. IMPRESSION: Low volume study with mild bibasilar opacities again noted-likely atelectasis/scarring. Electronically Signed   By: Margarette Canada M.D.   On: 03/22/2018 18:13   Ct Head Wo Contrast  Result Date: 03/22/2018 CLINICAL DATA:  Altered mental status. EXAM: CT HEAD WITHOUT CONTRAST TECHNIQUE: Contiguous axial images were obtained from the base of the skull through the vertex without intravenous contrast. COMPARISON:  CT scan of October 06, 2012. FINDINGS: Brain: Mild diffuse cortical atrophy is noted. Mild chronic ischemic white matter disease is noted. No mass effect or midline shift  is noted. Ventricular size is within normal limits. There is no  evidence of mass lesion, hemorrhage or acute infarction. Vascular: No hyperdense vessel or unexpected calcification. Skull: Normal. Negative for fracture or focal lesion. Sinuses/Orbits: Opacification of left maxillary sinus is again noted consistent with chronic sinusitis. Other: None. IMPRESSION: Mild diffuse cortical atrophy. Mild chronic ischemic white matter disease. No acute intracranial abnormality seen. Electronically Signed   By: Marijo Conception, M.D.   On: 03/22/2018 19:54   None  Pending Labs Unresulted Labs (From admission, onward)    Start     Ordered   03/29/18 0500  Creatinine, serum  (enoxaparin (LOVENOX)    CrCl >/= 30 ml/min)  Weekly,   R    Comments:  while on enoxaparin therapy    03/22/18 2202   03/23/18 4627  Basic metabolic panel  Tomorrow morning,   R     03/22/18 2202   03/22/18 2202  Vitamin B12  Once,   R     03/22/18 2202   03/22/18 2202  Folate RBC  Once,   R     03/22/18 2202   03/22/18 2202  RPR  Once,   R     03/22/18 2202   03/22/18 2202  Ammonia  Once,   R     03/22/18 2202   03/22/18 2202  HIV Antibody (routine testing w rflx)  Once,   R     03/22/18 2202   03/22/18 2201  TSH  Once,   R     03/22/18 2202          Vitals/Pain Today's Vitals   03/22/18 1900 03/22/18 1906 03/22/18 2000 03/22/18 2030  BP: (!) 192/86 (!) 192/86 (!) 190/90 (!) 183/95  Pulse:  75 78 77  Resp: (!) 22 19 (!) 28 20  Temp:      TempSrc:      SpO2:  97% 96% 98%    Isolation Precautions No active isolations  Medications Medications  aspirin tablet 325 mg (has no administration in time range)  azithromycin (ZITHROMAX) tablet 250 mg (has no administration in time range)  verapamil (CALAN) tablet 80 mg (has no administration in time range)  levothyroxine (SYNTHROID, LEVOTHROID) tablet 100 mcg (has no administration in time range)  pantoprazole (PROTONIX) EC tablet 40 mg (has no administration in time  range)  calcium-vitamin D 500-200 MG-UNIT per tablet 1 tablet (has no administration in time range)  multivitamin tablet 1 tablet (has no administration in time range)  albuterol (PROVENTIL) (5 MG/ML) 0.5% nebulizer solution 2.5 mg (has no administration in time range)  guaiFENesin (ROBITUSSIN) 100 MG/5ML liquid 200 mg (has no administration in time range)  Fluticasone-Umeclidin-Vilant 100-62.5-25 MCG/INH AEPB 1 puff (has no administration in time range)  enoxaparin (LOVENOX) injection 40 mg (has no administration in time range)  0.9 %  sodium chloride infusion (has no administration in time range)  acetaminophen (TYLENOL) tablet 650 mg (has no administration in time range)    Or  acetaminophen (TYLENOL) suppository 650 mg (has no administration in time range)  docusate sodium (COLACE) capsule 100 mg (has no administration in time range)  magnesium hydroxide (MILK OF MAGNESIA) suspension 30 mL (has no administration in time range)  bisacodyl (DULCOLAX) suppository 10 mg (has no administration in time range)  sodium phosphate (FLEET) 7-19 GM/118ML enema 1 enema (has no administration in time range)  ondansetron (ZOFRAN) tablet 4 mg (has no administration in time range)    Or  ondansetron (ZOFRAN) injection 4 mg (has no administration in time range)    Mobility  non-ambulatory

## 2018-03-22 NOTE — ED Notes (Signed)
Bed: MS11 Expected date:  Expected time:  Means of arrival:  Comments: EMS-rhonchi

## 2018-03-22 NOTE — H&P (Signed)
History and Physical    [name] [name]. YWV:[id number] DOB: 1925/05/01 DOA: 03/22/2018  PCP: Renaldo Reel, DO   Patient coming from: Home   Chief Complaint: Agitation, confusion, hallucinations   HPI: [name] [name]. is a 84 y.o. male with medical history significant for COPD with chronic hypoxic respiratory failure, hypothyroidism, hypertension, and chronic mild confusion and memory loss, now presenting to the emergency department for evaluation of increased confusion, agitation, and hallucinations.  Patient was in his usual state until early yesterday morning when he began yelling out, apparently confused, and reaching for things that were not there.  Since that time, he has not slept, and has continued to be agitated, confused, and hallucinating.  At baseline, he is conversant, able to feed himself, typically uses a wheelchair but is able to ambulate short distances.  He has remained in bed for the past 2 days and too confused to feed himself.  He has not complained of anything.  Has not appeared to be short of breath or in any distress.  No vomiting or diarrhea reported.  Patient has not had a bowel movement in 3 days.  He has been eating with family's encouragement.  ED Course: Upon arrival to the ED, patient is found to be afebrile, saturating adequately on his usual 2 L/min of supplemental oxygen, and with vitals otherwise stable.  Chemistry panel is notable for mild elevation in BUN to creatinine ratio.  CBC and urinalysis are unremarkable.  No hypercarbia on ABG.  Noncontrast head CT is negative for acute findings and chest x-ray is notable for atelectasis or scarring only.  Patient has remained hemodynamically stable in the ED, in no apparent respiratory distress, but remains markedly confused with intermittent agitation and apparent hallucinations.  He will be observed for ongoing evaluation and management.  Review of Systems:  All other systems reviewed and apart from HPI, are  negative.  Past Medical History:  Diagnosis Date  . Anemia   . COPD (chronic obstructive pulmonary disease) (HCC)    Mild  . HTN (hypertension)   . Hypothyroidism     Past Surgical History:  Procedure Laterality Date  . CATARACT EXTRACTION Bilateral   . GALLBLADDER SURGERY  02/21/2011  . OTHER SURGICAL HISTORY     finger right pointer--laceration repair     reports that he quit smoking about 46 years ago. His smoking use included cigarettes. He has a 37.50 pack-year smoking history. He has never used smokeless tobacco. He reports that he does not drink alcohol or use drugs.  Allergies  Allergen Reactions  . Codeine Nausea And Vomiting    Family History  Problem Relation Age of Onset  . Other Father        enlarged heart  . Lung disease Mother   . COPD Sister   . Heart disease Sister        Pacemaker  . Lung cancer Sister   . Colon cancer Neg Hx      Prior to Admission medications   Medication Sig Start Date End Date Taking? Authorizing Provider  albuterol (PROVENTIL) (5 MG/ML) 0.5% nebulizer solution Take 0.5 mLs (2.5 mg total) by nebulization every 6 (six) hours as needed for wheezing or shortness of breath. 11/02/17  Yes Juanito Doom, MD  aspirin 325 MG tablet Take 325 mg by mouth daily.   Yes [provider]  azithromycin (ZITHROMAX) 250 MG tablet Take 1 tablet (250 mg total) by mouth daily. 11/19/17  Yes Juanito Doom, MD  Calcium Carbonate-Vitamin D (CALCIUM-VITAMIN D) 500-200 MG-UNIT tablet Take 1 tablet by mouth daily.    Yes [provider]  Ferrous Sulfate (IRON) 28 MG TABS Take 1 tablet by mouth daily.   Yes [provider]  guaiFENesin (ROBITUSSIN) 100 MG/5ML liquid Take 200 mg by mouth 3 (three) times daily as needed for cough.   Yes [provider]  levothyroxine (SYNTHROID, LEVOTHROID) 100 MCG tablet Take 100 mcg by mouth daily.   Yes [provider]  Multiple Vitamin (MULTIVITAMIN) tablet Take 1 tablet  by mouth daily.   Yes [provider]  omeprazole (PRILOSEC) 40 MG capsule TAKE 1 CAPSULE BY MOUTH ONCE DAILY 07/19/17  Yes Noralee Space, MD  TRELEGY ELLIPTA 100-62.5-25 MCG/INH AEPB INHALE 1 PUFF ONCE DAILY Patient taking differently: Take 1 puff by mouth daily.  09/06/17  Yes Juanito Doom, MD  verapamil (CALAN) 80 MG tablet Take 80 mg by mouth 3 (three) times daily.   Yes [provider]  Respiratory Therapy Supplies (FLUTTER) DEVI Use as directed 03/31/16   Melvenia Needles, NP    Physical Exam: Vitals:   03/22/18 1900 03/22/18 1906 03/22/18 2000 03/22/18 2030  BP: (!) 192/86 (!) 192/86 (!) 190/90 (!) 183/95  Pulse:  75 78 77  Resp: (!) 22 19 (!) 28 20  Temp:      TempSrc:      SpO2:  97% 96% 98%    Constitutional: NAD, reaching out for something that is not there  Eyes: PERTLA, lids and conjunctivae normal ENMT: Mucous membranes are moist. Posterior pharynx clear of any exudate or lesions.   Neck: normal, supple, no masses, no thyromegaly Respiratory: Clear to auscultation b/l, no wheezing, no crackles. Normal respiratory effort.    Cardiovascular: S1 & S2 heard, regular rate and rhythm. No extremity edema.   Abdomen: No distension, no tenderness, soft. Bowel sounds normal.  Musculoskeletal: no clubbing / cyanosis. No joint deformity upper and lower extremities.    Skin: no significant rashes, lesions, ulcers. Poor turgor. Neurologic: CN 2-12 grossly intact. Sensation intact. Strength 5/5 in all 4 limbs.  Psychiatric: Alert and oriented to person and place only. Reaching out for objects not seen by others. Able to be redirected and cooperates.     Labs on Admission: I have personally reviewed following labs and imaging studies  CBC: Recent Labs  Lab 03/22/18 1625  WBC 8.3  NEUTROABS 5.8  HGB 12.0*  HCT 36.5*  MCV 95.3  PLT 161   Basic Metabolic Panel: Recent Labs  Lab 03/22/18 1625  NA 143  K 4.3  CL 106  CO2 28  GLUCOSE 91  BUN 24*    CREATININE 0.88  CALCIUM 9.3   GFR: Estimated Creatinine Clearance: 48.3 mL/min (by C-G formula based on SCr of 0.88 mg/dL). Liver Function Tests: Recent Labs  Lab 03/22/18 1625  AST 27  ALT 23  ALKPHOS 78  BILITOT 0.7  PROT 7.4  ALBUMIN 3.5   No results for input(s): LIPASE, AMYLASE in the last 168 hours. No results for input(s): AMMONIA in the last 168 hours. Coagulation Profile: No results for input(s): INR, PROTIME in the last 168 hours. Cardiac Enzymes: No results for input(s): CKTOTAL, CKMB, CKMBINDEX, TROPONINI in the last 168 hours. BNP (last 3 results) Recent Labs    09/23/17 1139 01/12/18 1613  PROBNP 120.0* 123.0*   HbA1C: No results for input(s): HGBA1C in the last 72 hours. CBG: No results for input(s): GLUCAP in the last 168  hours. Lipid Profile: No results for input(s): CHOL, HDL, LDLCALC, TRIG, CHOLHDL, LDLDIRECT in the last 72 hours. Thyroid Function Tests: No results for input(s): TSH, T4TOTAL, FREET4, T3FREE, THYROIDAB in the last 72 hours. Anemia Panel: No results for input(s): VITAMINB12, FOLATE, FERRITIN, TIBC, IRON, RETICCTPCT in the last 72 hours. Urine analysis:    Component Value Date/Time   COLORURINE YELLOW 03/22/2018 1609   APPEARANCEUR CLEAR 03/22/2018 1609   LABSPEC 1.011 03/22/2018 1609   PHURINE 7.0 03/22/2018 1609   GLUCOSEU NEGATIVE 03/22/2018 1609   GLUCOSEU NEGATIVE 03/09/2018 1210   HGBUR SMALL (A) 03/22/2018 1609   BILIRUBINUR NEGATIVE 03/22/2018 1609   KETONESUR NEGATIVE 03/22/2018 1609   PROTEINUR NEGATIVE 03/22/2018 1609   UROBILINOGEN 0.2 03/09/2018 1210   NITRITE NEGATIVE 03/22/2018 1609   LEUKOCYTESUR NEGATIVE 03/22/2018 1609   Sepsis Labs: @LABRCNTIP (procalcitonin:4,lacticidven:4) )No results found for this or any previous visit (from the past 240 hour(s)).   Radiological Exams on Admission: Dg Chest 2 View  Result Date: 03/22/2018 CLINICAL DATA:  Acute cough and weakness. EXAM: CHEST - 2 VIEW COMPARISON:   03/17/2018 and prior exams FINDINGS: This is a low volume study. Cardiomediastinal silhouette is unchanged. Mild bibasilar opacities again noted-likely atelectasis/scarring. No pleural effusion, definite airspace disease or pneumothorax. IMPRESSION: Low volume study with mild bibasilar opacities again noted-likely atelectasis/scarring. Electronically Signed   By: Margarette Canada M.D.   On: 03/22/2018 18:13   Ct Head Wo Contrast  Result Date: 03/22/2018 CLINICAL DATA:  Altered mental status. EXAM: CT HEAD WITHOUT CONTRAST TECHNIQUE: Contiguous axial images were obtained from the base of the skull through the vertex without intravenous contrast. COMPARISON:  CT scan of October 06, 2012. FINDINGS: Brain: Mild diffuse cortical atrophy is noted. Mild chronic ischemic white matter disease is noted. No mass effect or midline shift is noted. Ventricular size is within normal limits. There is no evidence of mass lesion, hemorrhage or acute infarction. Vascular: No hyperdense vessel or unexpected calcification. Skull: Normal. Negative for fracture or focal lesion. Sinuses/Orbits: Opacification of left maxillary sinus is again noted consistent with chronic sinusitis. Other: None. IMPRESSION: Mild diffuse cortical atrophy. Mild chronic ischemic white matter disease. No acute intracranial abnormality seen. Electronically Signed   By: Marijo Conception, M.D.   On: 03/22/2018 19:54    EKG: Not performed.   Assessment/Plan   1. Acute encephalopathy  - Presents with 2 days of increased confusion, agitation, and reaching for things that aren't there  - Family reports some baseline confusion, but current state represents an acute and marked departure from his usual mental status  - Head CT is negative for acute findings and no focal deficits noted on exam  - Basic labs and UA unremarkable, no hypercarbia on ABG  - Etiology not yet determined; dehydration and/or constipation could be contributing as patient appears hypovolemic  and family reports no BM in 3 days   - Check TSH, ammonia, B12, folate, and RPR  - Continue supportive care   2. COPD; chronic hypoxic respiratory failure  - No wheezing or dyspnea  - Continue supplemental O2, Trelegy, prn albuterol  - Follows with pulmonary and kept on daily azithromycin 250 mg, will continue    3. Hypertension  - BP elevated in ED in setting of agitation  - Continue verapamil and use hydralazine IVP's as needed   4. Hypothyroidism  - Check TSH given his presentation - Continue Synthroid     DVT prophylaxis: Lovenox Code Status: Full code for now, son at bedside  will be conferring with his sister Family Communication: Son and daughter-in-law updated at bedside  Consults called: None Admission status: Observation     Vianne Bulls, MD [facility] Pager [phone]  If 7PM-7AM, please contact night-coverage [email or web address] Password [email or web address]  03/22/2018, 10:02 PM

## 2018-03-22 NOTE — ED Triage Notes (Signed)
Family report pt. To be "less active and responsive" today. They also told paramedics that p t. Had just completed a ten-day course of antibiotic for pneumonia about a week ago. He arrives on L2 per cannula, which he is on continuously at home. He is in no distress. Clamped G-tube noted.

## 2018-03-23 ENCOUNTER — Other Ambulatory Visit: Payer: Self-pay

## 2018-03-23 DIAGNOSIS — G4709 Other insomnia: Secondary | ICD-10-CM | POA: Diagnosis present

## 2018-03-23 DIAGNOSIS — Z7982 Long term (current) use of aspirin: Secondary | ICD-10-CM | POA: Diagnosis not present

## 2018-03-23 DIAGNOSIS — I130 Hypertensive heart and chronic kidney disease with heart failure and stage 1 through stage 4 chronic kidney disease, or unspecified chronic kidney disease: Secondary | ICD-10-CM | POA: Diagnosis present

## 2018-03-23 DIAGNOSIS — G92 Toxic encephalopathy: Secondary | ICD-10-CM | POA: Diagnosis present

## 2018-03-23 DIAGNOSIS — J449 Chronic obstructive pulmonary disease, unspecified: Secondary | ICD-10-CM | POA: Diagnosis present

## 2018-03-23 DIAGNOSIS — R4182 Altered mental status, unspecified: Secondary | ICD-10-CM | POA: Diagnosis not present

## 2018-03-23 DIAGNOSIS — J9611 Chronic respiratory failure with hypoxia: Secondary | ICD-10-CM | POA: Diagnosis present

## 2018-03-23 DIAGNOSIS — E860 Dehydration: Secondary | ICD-10-CM | POA: Diagnosis present

## 2018-03-23 DIAGNOSIS — I5032 Chronic diastolic (congestive) heart failure: Secondary | ICD-10-CM | POA: Diagnosis present

## 2018-03-23 DIAGNOSIS — Z8249 Family history of ischemic heart disease and other diseases of the circulatory system: Secondary | ICD-10-CM | POA: Diagnosis not present

## 2018-03-23 DIAGNOSIS — J439 Emphysema, unspecified: Secondary | ICD-10-CM | POA: Diagnosis not present

## 2018-03-23 DIAGNOSIS — G9340 Encephalopathy, unspecified: Secondary | ICD-10-CM | POA: Diagnosis not present

## 2018-03-23 DIAGNOSIS — R410 Disorientation, unspecified: Secondary | ICD-10-CM | POA: Diagnosis not present

## 2018-03-23 DIAGNOSIS — K5900 Constipation, unspecified: Secondary | ICD-10-CM | POA: Diagnosis present

## 2018-03-23 DIAGNOSIS — Z6821 Body mass index (BMI) 21.0-21.9, adult: Secondary | ICD-10-CM | POA: Diagnosis not present

## 2018-03-23 DIAGNOSIS — Z79899 Other long term (current) drug therapy: Secondary | ICD-10-CM | POA: Diagnosis not present

## 2018-03-23 DIAGNOSIS — E039 Hypothyroidism, unspecified: Secondary | ICD-10-CM | POA: Diagnosis present

## 2018-03-23 DIAGNOSIS — I10 Essential (primary) hypertension: Secondary | ICD-10-CM | POA: Diagnosis not present

## 2018-03-23 DIAGNOSIS — T17908D Unspecified foreign body in respiratory tract, part unspecified causing other injury, subsequent encounter: Secondary | ICD-10-CM | POA: Diagnosis not present

## 2018-03-23 DIAGNOSIS — R4702 Dysphasia: Secondary | ICD-10-CM | POA: Diagnosis present

## 2018-03-23 DIAGNOSIS — Z87891 Personal history of nicotine dependence: Secondary | ICD-10-CM | POA: Diagnosis not present

## 2018-03-23 DIAGNOSIS — E43 Unspecified severe protein-calorie malnutrition: Secondary | ICD-10-CM | POA: Diagnosis present

## 2018-03-23 DIAGNOSIS — Z79890 Hormone replacement therapy (postmenopausal): Secondary | ICD-10-CM | POA: Diagnosis not present

## 2018-03-23 LAB — AMMONIA: AMMONIA: 20 umol/L (ref 9–35)

## 2018-03-23 LAB — BASIC METABOLIC PANEL
ANION GAP: 10 (ref 5–15)
BUN: 25 mg/dL — ABNORMAL HIGH (ref 8–23)
CALCIUM: 9 mg/dL (ref 8.9–10.3)
CO2: 25 mmol/L (ref 22–32)
CREATININE: 0.81 mg/dL (ref 0.61–1.24)
Chloride: 107 mmol/L (ref 98–111)
GFR calc non Af Amer: >60 mL/min (ref >60)
Glucose, Bld: 104 mg/dL — ABNORMAL HIGH (ref 70–99)
Potassium: 4.2 mmol/L (ref 3.5–5.1)
Sodium: 142 mmol/L (ref 135–145)

## 2018-03-23 LAB — VITAMIN B12: Vitamin B-12: 495 pg/mL (ref 180–914)

## 2018-03-23 LAB — RPR: RPR Ser Ql: NONREACTIVE

## 2018-03-23 LAB — HIV ANTIBODY (ROUTINE TESTING W REFLEX): HIV SCREEN 4TH GENERATION: NONREACTIVE

## 2018-03-23 LAB — TSH: TSH: 2.509 u[IU]/mL (ref 0.350–4.500)

## 2018-03-23 MED ORDER — SODIUM CHLORIDE 0.9 % IV SOLN
INTRAVENOUS | Status: AC
  Administered 2018-03-23 (×2): via INTRAVENOUS

## 2018-03-23 MED ORDER — OLANZAPINE 5 MG PO TABS: 2.5000 mg | ORAL_TABLET | Freq: Every evening | ORAL | Status: DC | PRN

## 2018-03-23 NOTE — Consult Note (Addendum)
[facility] Face-to-Face Psychiatry Consult   Reason for Consult:  Hallucinations Referring Physician:  Dr. Karleen Hampshire  Patient Identification: [name] [name]. MRN:  [id number] Principal Diagnosis: Altered mental status Diagnosis:   Patient Active Problem List   Diagnosis Date Noted  . Acute encephalopathy [G93.40] 03/22/2018  . Hypothyroidism [E03.9] 03/22/2018  . Pneumonia [J18.9] 03/10/2018  . Unsteady gait [R26.81] 01/12/2018  . HTN (hypertension) [I10] 01/12/2018  . Hyponatremia [E87.1] 01/12/2018  . Aspiration into airway [T17.908A] 02/17/2017  . Osteopenia [M85.80] 01/05/2017  . Anemia, iron deficiency [D50.9] 09/13/2015  . Fall [W19.XXXA] 07/23/2015  . Herpes zoster [B02.9] 04/01/2015  . Normocytic normochromic anemia [D64.9] 12/25/2013  . Solitary pulmonary nodule [R91.1] 03/08/2013  . Diastolic dysfunction [W97.94] 10/21/2012  . Chronic cough [R05] 08/26/2012  . COPD (chronic obstructive pulmonary disease) with emphysema ([place]) [J43.9] 08/12/2012  . Chronic respiratory failure [facility]) [J96.10] 08/12/2012    Total Time spent with patient: 30 minutes  Subjective:   [name] [name]. is a 84 y.o. male patient admitted with altered mental status.  HPI:   Per chart review, patient was admitted with increased confusion, agitation and hallucinations. He was in his usual state of health until the morning of 10/7 when he began yelling out and reaching for objects that were not there. He has not been sleeping since this time. At baseline, he has some confusion. He is able to feed himself. He uses a wheelchair to ambulate short distances. He had not had a bowel movement in 3 days.  Primary team believes that dehydration and/or constipation may be contributing to his presentation. Head CT was unremarkable.    On interview, Mr. Goracke is drowsy after awakening although he appropriately answers questions. He is only oriented to person. He does not remember recently feeling more confused. He  denies SI, HI or AVH. He denies problems with sleep or appetite. He denies a prior psychiatric history. His son and granddaughter were spoken to later after seen at bedside. His son reports that he was admitted to [facility] for 2 months for rehab after a fall and sustaining a hip fracture. He improved with therapy. He was treated for pneumonia a week ago. There was concern for aspiration but he later passed a swallow evaluation. His presentation was similar to now. He has been more agitated and confused for the past 4 days. He has not been sleeping. He has home health care.    Past Psychiatric History: Denies   Risk to Self:  None. Denies SI.  Risk to Others:  None. Denies HI.  Prior Inpatient Therapy:  Denies  Prior Outpatient Therapy:  Denies   Past Medical History:  Past Medical History:  Diagnosis Date  . Anemia   . COPD (chronic obstructive pulmonary disease) (HCC)    Mild  . HTN (hypertension)   . Hypothyroidism     Past Surgical History:  Procedure Laterality Date  . CATARACT EXTRACTION Bilateral   . GALLBLADDER SURGERY  02/21/2011  . OTHER SURGICAL HISTORY     finger right pointer--laceration repair   Family History:  Family History  Problem Relation Age of Onset  . Other Father        enlarged heart  . Lung disease Mother   . COPD Sister   . Heart disease Sister        Pacemaker  . Lung cancer Sister   . Colon cancer Neg Hx    Family Psychiatric  History: Denies  Social History:  Social History  Substance and Sexual Activity  Alcohol Use No     Social History   Substance and Sexual Activity  Drug Use No    Social History   Socioeconomic History  . Marital status: Married    Spouse name: Estill Bamberg  . Number of children: 5  . Years of education: Not on file  . Highest education level: Not on file  Occupational History  . Occupation: retired  Scientific laboratory technician  . Financial resource strain: Not on file  . Food insecurity:    Worry: Not on file     Inability: Not on file  . Transportation needs:    Medical: Not on file    Non-medical: Not on file  Tobacco Use  . Smoking status: Former Smoker    Packs/day: 1.50    Years: 25.00    Pack years: 37.50    Types: Cigarettes    Last attempt to quit: 06/16/1971    Years since quitting: 46.8  . Smokeless tobacco: Never Used  . Tobacco comment: started at 84 years old  Substance and Sexual Activity  . Alcohol use: No  . Drug use: No  . Sexual activity: Never  Lifestyle  . Physical activity:    Days per week: Not on file    Minutes per session: Not on file  . Stress: Not on file  Relationships  . Social connections:    Talks on phone: Not on file    Gets together: Not on file    Attends religious service: Not on file    Active member of club or organization: Not on file    Attends meetings of clubs or organizations: Not on file    Relationship status: Not on file  Other Topics Concern  . Not on file  Social History Narrative   Lives with wife.    Additional Social History: He live at home with his son and daughter-in-law. He was recently discharged from [facility] 3 weeks ago for rehab after falling and sustaining a hip fracture. He was previously living at home with his wife. She has dementia and her daughters are now living at their home to care for her. He denies alcohol or illicit substance use.     Allergies:   Allergies  Allergen Reactions  . Codeine Nausea And Vomiting    Labs:  Results for orders placed or performed during the hospital encounter of 03/22/18 (from the past 48 hour(s))  Urinalysis, Routine w reflex microscopic     Status: Abnormal   Collection Time: 03/22/18  4:09 PM  Result Value Ref Range   Color, Urine YELLOW YELLOW   APPearance CLEAR CLEAR   Specific Gravity, Urine 1.011 1.005 - 1.030   pH 7.0 5.0 - 8.0   Glucose, UA NEGATIVE NEGATIVE mg/dL   Hgb urine dipstick SMALL (A) NEGATIVE   Bilirubin Urine NEGATIVE NEGATIVE   Ketones, ur NEGATIVE  NEGATIVE mg/dL   Protein, ur NEGATIVE NEGATIVE mg/dL   Nitrite NEGATIVE NEGATIVE   Leukocytes, UA NEGATIVE NEGATIVE   RBC / HPF 0-5 0 - 5 RBC/hpf   WBC, UA 0-5 0 - 5 WBC/hpf   Bacteria, UA RARE (A) NONE SEEN   Squamous Epithelial / LPF 0-5 0 - 5   Mucus PRESENT     Comment: Performed at [facility], [place] [address] [place], [place] [address]  CBC with Differential/Platelet     Status: Abnormal   Collection Time: 03/22/18  4:25 PM  Result Value Ref Range  WBC 8.3 4.0 - 10.5 K/uL   RBC 3.83 (L) 4.22 - 5.81 MIL/uL   Hemoglobin 12.0 (L) 13.0 - 17.0 g/dL   HCT 36.5 (L) 39.0 - 52.0 %   MCV 95.3 80.0 - 100.0 fL   MCH 31.3 26.0 - 34.0 pg   MCHC 32.9 30.0 - 36.0 g/dL   RDW 13.0 11.5 - 15.5 %   Platelets 156 150 - 400 K/uL   nRBC 0.0 0.0 - 0.2 %   Neutrophils Relative % 70 %   Neutro Abs 5.8 1.7 - 7.7 K/uL   Lymphocytes Relative 20 %   Lymphs Abs 1.7 0.7 - 4.0 K/uL   Monocytes Relative 6 %   Monocytes Absolute 0.5 0.1 - 1.0 K/uL   Eosinophils Relative 3 %   Eosinophils Absolute 0.3 0.0 - 0.5 K/uL   Basophils Relative 1 %   Basophils Absolute 0.0 0.0 - 0.1 K/uL   Immature Granulocytes 0 %   Abs Immature Granulocytes 0.02 0.00 - 0.07 K/uL    Comment: Performed at [facility], [place] [address] [place], [place] [address]  Comprehensive metabolic panel     Status: Abnormal   Collection Time: 03/22/18  4:25 PM  Result Value Ref Range   Sodium 143 135 - 145 mmol/L   Potassium 4.3 3.5 - 5.1 mmol/L   Chloride 106 98 - 111 mmol/L   CO2 28 22 - 32 mmol/L   Glucose, Bld 91 70 - 99 mg/dL   BUN 24 (H) 8 - 23 mg/dL   Creatinine, Ser 0.88 0.61 - 1.24 mg/dL   Calcium 9.3 8.9 - 10.3 mg/dL   Total Protein 7.4 6.5 - 8.1 g/dL   Albumin 3.5 3.5 - 5.0 g/dL   AST 27 15 - 41 U/L   ALT 23 0 - 44 U/L   Alkaline Phosphatase 78 38 - 126 U/L   Total Bilirubin 0.7 0.3 - 1.2 mg/dL   GFR calc non Af Amer >60 >60 mL/min   GFR calc Af Amer >60 >60 mL/min    Comment:  (NOTE) The eGFR has been calculated using the CKD EPI equation. This calculation has not been validated in all clinical situations. eGFR's persistently <60 mL/min signify possible Chronic Kidney Disease.    Anion gap 9 5 - 15    Comment: Performed at [facility], [place] [address] [place], [place] [address]  Blood gas, arterial ([facility] & [facility] ONLY)     Status: Abnormal   Collection Time: 03/22/18  8:13 PM  Result Value Ref Range   O2 Content 2.0 L/min   Delivery systems NASAL CANNULA    pH, Arterial 7.438 7.350 - 7.450   pCO2 arterial 38.8 32.0 - 48.0 mmHg   pO2, Arterial 79.6 (L) 83.0 - 108.0 mmHg   Bicarbonate 25.8 20.0 - 28.0 mmol/L   Acid-Base Excess 2.1 (H) 0.0 - 2.0 mmol/L   O2 Saturation 95.4 %   Patient temperature 98.6    Collection site RIGHT RADIAL    Drawn by [phone]    Sample type ARTERIAL DRAW    Allens test (pass/fail) PASS PASS    Comment: Performed at [facility], [place] [address], [place],  [address]  TSH     Status: None   Collection Time: 03/22/18 11:02 PM  Result Value Ref Range   TSH 2.509 0.350 - 4.500 uIU/mL    Comment: Performed by a 3rd Generation assay with a functional sensitivity of <=0.01 uIU/mL. Performed at [facility]  [place] [address], [place], [place] [address]   Vitamin B12     Status: None   Collection Time: 03/22/18 11:02 PM  Result Value Ref Range   Vitamin B-12 495 180 - 914 pg/mL    Comment: (NOTE) This assay is not validated for testing neonatal or myeloproliferative syndrome specimens for Vitamin B12 levels. Performed at [facility], [place] [address], [place], [place] [address]   Ammonia     Status: None   Collection Time: 03/22/18 11:02 PM  Result Value Ref Range   Ammonia 20 9 - 35 umol/L    Comment: Performed at [facility], [place] [address] [place], [place] [address]  Basic metabolic panel     Status: Abnormal   Collection Time:  03/23/18  3:44 AM  Result Value Ref Range   Sodium 142 135 - 145 mmol/L   Potassium 4.2 3.5 - 5.1 mmol/L   Chloride 107 98 - 111 mmol/L   CO2 25 22 - 32 mmol/L   Glucose, Bld 104 (H) 70 - 99 mg/dL   BUN 25 (H) 8 - 23 mg/dL   Creatinine, Ser 0.81 0.61 - 1.24 mg/dL   Calcium 9.0 8.9 - 10.3 mg/dL   GFR calc non Af Amer >60 >60 mL/min   GFR calc Af Amer >60 >60 mL/min    Comment: (NOTE) The eGFR has been calculated using the CKD EPI equation. This calculation has not been validated in all clinical situations. eGFR's persistently <60 mL/min signify possible Chronic Kidney Disease.    Anion gap 10 5 - 15    Comment: Performed at [facility], [place] [address], [place],  [address]    Current Facility-Administered Medications  Medication Dose Route Frequency Provider Last Rate Last Dose  . 0.9 %  sodium chloride infusion   Intravenous Continuous Hosie Poisson, MD 75 mL/hr at 03/23/18 1045    . acetaminophen (TYLENOL) tablet 650 mg  650 mg Oral Q6H PRN Opyd, Ilene Qua, MD       Or  . acetaminophen (TYLENOL) suppository 650 mg  650 mg Rectal Q6H PRN Opyd, Ilene Qua, MD      . albuterol (PROVENTIL) (2.5 MG/3ML) 0.083% nebulizer solution 2.5 mg  2.5 mg Nebulization Q6H PRN Opyd, Ilene Qua, MD      . aspirin tablet 325 mg  325 mg Oral Daily Opyd, Ilene Qua, MD      . azithromycin (ZITHROMAX) tablet 250 mg  250 mg Oral Daily Opyd, Ilene Qua, MD      . bisacodyl (DULCOLAX) suppository 10 mg  10 mg Rectal Daily PRN Opyd, Ilene Qua, MD      . budesonide (PULMICORT) nebulizer solution 0.25 mg  0.25 mg Nebulization BID Opyd, Ilene Qua, MD   0.25 mg at 03/23/18 0737  . calcium-vitamin D (OSCAL WITH D) 500-200 MG-UNIT per tablet 1 tablet  1 tablet Oral Daily Opyd, Ilene Qua, MD      . docusate sodium (COLACE) capsule 100 mg  100 mg Oral BID Opyd, Ilene Qua, MD   100 mg at 03/22/18 2331  . enoxaparin (LOVENOX) injection 40 mg  40 mg Subcutaneous QHS Opyd, Ilene Qua, MD   40 mg at  03/22/18 2308  . guaiFENesin (ROBITUSSIN) 100 MG/5ML solution 200 mg  200 mg Oral TID PRN Opyd, Ilene Qua, MD      . haloperidol lactate (HALDOL) injection 1 mg  1 mg Intravenous Q6H PRN Opyd, Ilene Qua, MD      .  hydrALAZINE (APRESOLINE) injection 10 mg  10 mg Intravenous Q4H PRN Opyd, Ilene Qua, MD      . levothyroxine (SYNTHROID, LEVOTHROID) tablet 100 mcg  100 mcg Oral QAC breakfast Opyd, Ilene Qua, MD      . magnesium hydroxide (MILK OF MAGNESIA) suspension 30 mL  30 mL Oral Daily PRN Opyd, Ilene Qua, MD   30 mL at 03/22/18 2331  . multivitamin with minerals tablet 1 tablet  1 tablet Oral Daily Opyd, Ilene Qua, MD      . ondansetron (ZOFRAN) tablet 4 mg  4 mg Oral Q6H PRN Opyd, Ilene Qua, MD       Or  . ondansetron (ZOFRAN) injection 4 mg  4 mg Intravenous Q6H PRN Opyd, Ilene Qua, MD      . pantoprazole (PROTONIX) EC tablet 40 mg  40 mg Oral Daily Opyd, Ilene Qua, MD      . sodium phosphate (FLEET) 7-19 GM/118ML enema 1 enema  1 enema Rectal Once PRN Opyd, Ilene Qua, MD      . umeclidinium-vilanterol (ANORO ELLIPTA) 62.5-25 MCG/INH 1 puff  1 puff Inhalation Daily Opyd, Ilene Qua, MD      . verapamil (CALAN) tablet 80 mg  80 mg Oral TID Vianne Bulls, MD   80 mg at 03/22/18 2331    Musculoskeletal: Strength & Muscle Tone: Generalized weakness. Gait & Station: unable to stand Patient leans: N/A  Psychiatric Specialty Exam: Physical Exam  Nursing note and vitals reviewed. Constitutional: He is oriented to person, place, and time. He appears well-developed and well-nourished.  HENT:  Head: Normocephalic and atraumatic.  Neck: Normal range of motion.  Respiratory: Effort normal.  Musculoskeletal: Normal range of motion.  Neurological: He is alert and oriented to person, place, and time.  Psychiatric: Judgment and thought content normal. His affect is blunt. His speech is slurred. He is slowed. Cognition and memory are impaired.    Review of Systems  Cardiovascular: Negative for  chest pain.  Gastrointestinal: Negative for abdominal pain, constipation, diarrhea, nausea and vomiting.  Psychiatric/Behavioral: Negative for depression, hallucinations, substance abuse and suicidal ideas. The patient is not nervous/anxious and does not have insomnia.   All other systems reviewed and are negative.   Blood pressure (!) 162/70, pulse 71, temperature 98 F (36.7 C), temperature source Oral, resp. rate 16, height 5' 6"  (1.676 m), weight 60.8 kg, SpO2 96 %.Body mass index is 21.63 kg/m.  General Appearance: Fairly Groomed, elderly, Caucasian male, wearing a hospital gown with thinning hair and edentulous who is lying in bed. NAD.   Eye Contact:  Fair  Speech:  Slow and Slurred  Volume:  Decreased  Mood:  Euthymic  Affect:  Constricted  Thought Process:  Linear and Descriptions of Associations: Intact  Orientation:  Other:  Only oriented to self.  Thought Content:  Logical  Suicidal Thoughts:  No  Homicidal Thoughts:  No  Memory:  Immediate;   Poor Recent;   Fair Remote;   Fair  Judgement:  Poor  Insight:  Poor  Psychomotor Activity:  Decreased  Concentration:  Concentration: Fair and Attention Span: Fair  Recall:  Poor  Fund of Knowledge:  Fair  Language:  Good  Akathisia:  No  Handed:  Right  AIMS (if indicated):   N/A  Assets:  Communication Skills Housing Social Support  ADL's:  Impaired  Cognition:  Impaired with deficits in recent memory.   Sleep:   Poor for the past 4 days.    Assessment:  Llewyn Heap [name]. is a 84 y.o. male who is admitted with altered mental status in the setting of recent pneumonia and is currently receiving treatment for dehydration and constipation. His presentation is most consistent with delirium due to a medical condition give the acute decline in his mental status. Recommend low dose Zyprexa only for worsening agitation or psychosis. He denies SI, HI or AVH and does not warrant inpatient psychiatric hospitalization at this time.    Treatment Plan Summary: -Consider Zyprexa 2.5 mg qhs PRN for psychosis, agitation and insomnia.  -Will need EKG to monitor for QTc prolongation.  -Psychiatry will sign off on patient at this time. Please consult psychiatry again as needed.   Disposition: No evidence of imminent risk to self or others at present.   Patient does not meet criteria for psychiatric inpatient admission.  Faythe Dingwall, DO 03/23/2018 1:25 PM

## 2018-03-23 NOTE — Progress Notes (Signed)
Patient has been too lethargic to safely administer oral meds

## 2018-03-23 NOTE — Progress Notes (Signed)
PROGRESS NOTE    [name] Lengacher Sr.  IRJ:[id number] DOB: [birth year] DOA: 03/22/2018 PCP: Renaldo Reel, DO    Brief Narrative:  [name] Whittinghill Sr. is a 84 y.o. male with medical history significant for COPD with chronic hypoxic respiratory failure, hypothyroidism, hypertension, and chronic mild confusion and memory loss, now presenting to the emergency department for evaluation of increased confusion, agitation, and hallucinations.   Patient's son at bedside reports that he has been having increased confusion with agitation and hallucinations since Saturday and has not slept since then.  Patient also had about 3 weeks of physical therapy at [facility] and was recently discharged home.  Psychiatric consulted for increased agitation, hallucinations and insomnia.   Assessment & Plan:   Principal Problem:   Acute encephalopathy Active Problems:   COPD (chronic obstructive pulmonary disease) with emphysema (HCC)   Chronic respiratory failure (HCC)   HTN (hypertension)   Hypothyroidism   Acute encephalopathy Unclear etiology differential includes infection versus dehydration versus Initial evaluation with CT head, negative for acute findings.  Patient is able to move all his extremities.  ABG does not show any hypercarbia.  There are no signs of infection so far.  His urine analysis is negative his chest x-ray is negative for pneumonia or acute changes. TSH within normal limits, B12 within normal limits, ammonia level is within normal limits.  supportive care with IV fluids. Psychiatric consulted for increased agitation and hallucinations. Physical therapy evaluation for deconditioning. IV Haldol ordered for agitation.    Hypertension Suboptimally controlled currently on verapamil, use as needed hydralazine.   Hypothyroidism TSH within normal limits, resume home dose of Synthroid.    COPD, chronic No wheezing heard on exam Albuterol as needed.   DVT prophylaxis:  Lovenox Code Status: Full code Family Communication: Discussed with son at bedside Disposition Plan: Pending PT evaluation and psychiatric evaluation..   Consultants:   Psychiatric  Procedures: None Antimicrobials: Azithromycin for bronchitis  Subjective: Calm was able to tell me his name and date of birth and that he is not at home.  He denies any pain.  He is not oriented to place or time.  Objective: Vitals:   03/22/18 2200 03/22/18 2251 03/23/18 0630 03/23/18 0738  BP: (!) 174/105 (!) 173/96 (!) 162/70   Pulse:  82 77 71  Resp: 15  16 16   Temp:  (!) 97.4 F (36.3 C) 98 F (36.7 C)   TempSrc:  Oral Oral   SpO2:  95% 99% 96%  Weight:  60.8 kg    Height:  5\' 6"  (1.676 m)      Intake/Output Summary (Last 24 hours) at 03/23/2018 1049 Last data filed at 03/23/2018 0925 Gross per 24 hour  Intake -  Output 1200 ml  Net -1200 ml   Filed Weights   03/22/18 2251  Weight: 60.8 kg    Examination:  General exam: Appears calm and comfortable  Respiratory system: Clear to auscultation. Respiratory effort normal. Cardiovascular system: S1 & S2 heard, RRR. No JVD, murmurs, rubs, gallops or clicks. No pedal edema. Gastrointestinal system: Abdomen is nondistended, soft and nontender. No organomegaly or masses felt. Normal bowel sounds heard. Central nervous system: Alert and oriented. No focal neurological deficits. Extremities: Symmetric 5 x 5 power. Skin: No rashes, lesions or ulcers Psychiatry: Judgement and insight appear normal. Mood & affect appropriate.     Data Reviewed: I have personally reviewed following labs and imaging studies  CBC: Recent Labs  Lab 03/22/18 1625  WBC 8.3  NEUTROABS 5.8  HGB 12.0*  HCT 36.5*  MCV 95.3  PLT 086   Basic Metabolic Panel: Recent Labs  Lab 03/22/18 1625 03/23/18 0344  NA 143 142  K 4.3 4.2  CL 106 107  CO2 28 25  GLUCOSE 91 104*  BUN 24* 25*  CREATININE 0.88 0.81  CALCIUM 9.3 9.0   GFR: Estimated Creatinine  Clearance: 52.1 mL/min (by C-G formula based on SCr of 0.81 mg/dL). Liver Function Tests: Recent Labs  Lab 03/22/18 1625  AST 27  ALT 23  ALKPHOS 78  BILITOT 0.7  PROT 7.4  ALBUMIN 3.5   No results for input(s): LIPASE, AMYLASE in the last 168 hours. Recent Labs  Lab 03/22/18 2302  AMMONIA 20   Coagulation Profile: No results for input(s): INR, PROTIME in the last 168 hours. Cardiac Enzymes: No results for input(s): CKTOTAL, CKMB, CKMBINDEX, TROPONINI in the last 168 hours. BNP (last 3 results) Recent Labs    09/23/17 1139 01/12/18 1613  PROBNP 120.0* 123.0*   HbA1C: No results for input(s): HGBA1C in the last 72 hours. CBG: No results for input(s): GLUCAP in the last 168 hours. Lipid Profile: No results for input(s): CHOL, HDL, LDLCALC, TRIG, CHOLHDL, LDLDIRECT in the last 72 hours. Thyroid Function Tests: Recent Labs    03/22/18 2302  TSH 2.509   Anemia Panel: Recent Labs    03/22/18 2302  VITAMINB12 495   Sepsis Labs: No results for input(s): PROCALCITON, LATICACIDVEN in the last 168 hours.  No results found for this or any previous visit (from the past 240 hour(s)).       Radiology Studies: Dg Chest 2 View  Result Date: 03/22/2018 CLINICAL DATA:  Acute cough and weakness. EXAM: CHEST - 2 VIEW COMPARISON:  03/17/2018 and prior exams FINDINGS: This is a low volume study. Cardiomediastinal silhouette is unchanged. Mild bibasilar opacities again noted-likely atelectasis/scarring. No pleural effusion, definite airspace disease or pneumothorax. IMPRESSION: Low volume study with mild bibasilar opacities again noted-likely atelectasis/scarring. Electronically Signed   By: Margarette Canada M.D.   On: 03/22/2018 18:13   Ct Head Wo Contrast  Result Date: 03/22/2018 CLINICAL DATA:  Altered mental status. EXAM: CT HEAD WITHOUT CONTRAST TECHNIQUE: Contiguous axial images were obtained from the base of the skull through the vertex without intravenous contrast.  COMPARISON:  CT scan of October 06, 2012. FINDINGS: Brain: Mild diffuse cortical atrophy is noted. Mild chronic ischemic white matter disease is noted. No mass effect or midline shift is noted. Ventricular size is within normal limits. There is no evidence of mass lesion, hemorrhage or acute infarction. Vascular: No hyperdense vessel or unexpected calcification. Skull: Normal. Negative for fracture or focal lesion. Sinuses/Orbits: Opacification of left maxillary sinus is again noted consistent with chronic sinusitis. Other: None. IMPRESSION: Mild diffuse cortical atrophy. Mild chronic ischemic white matter disease. No acute intracranial abnormality seen. Electronically Signed   By: Marijo Conception, M.D.   On: 03/22/2018 19:54        Scheduled Meds: . aspirin  325 mg Oral Daily  . azithromycin  250 mg Oral Daily  . budesonide (PULMICORT) nebulizer solution  0.25 mg Nebulization BID  . calcium-vitamin D  1 tablet Oral Daily  . docusate sodium  100 mg Oral BID  . enoxaparin (LOVENOX) injection  40 mg Subcutaneous QHS  . levothyroxine  100 mcg Oral QAC breakfast  . multivitamin with minerals  1 tablet Oral Daily  . pantoprazole  40 mg Oral Daily  . umeclidinium-vilanterol  1  puff Inhalation Daily  . verapamil  80 mg Oral TID   Continuous Infusions: . sodium chloride       LOS: 0 days    Time spent: 40 minutes    Hosie Poisson, MD [facility] Pager [phone]  If 7PM-7AM, please contact night-coverage [email or web address] Password [email or web address] 03/23/2018, 10:49 AM

## 2018-03-23 NOTE — Progress Notes (Signed)
Patient is lethargic, unable to administer oral meds at this time

## 2018-03-23 NOTE — Care Management Note (Signed)
Case Management Note  Patient Details  Name: [name] [name]. MRN: [id number] Date of Birth: 1925/04/07  This CM met with pt and family at bedside. Pt has been living with family and receiving home health services from [place]. Pt was recently at [facility] place (3 weeks ago) prior to moving in with family. [facility] rep alerted of admission. PT consult pending. Will await recommendations for DC planning.   Lynnell Catalan, RN 03/23/2018, 11:51 AM [phone]

## 2018-03-23 NOTE — Care Management Obs Status (Signed)
[place] NOTIFICATION   Patient Details  Name: [name] Ybarbo Sr. MRN: [id number] Date of Birth: 1924-08-10   Medicare Observation Status Notification Given:  Yes   Confused, unable to sign. Copy given to family.  Lynnell Catalan, RN 03/23/2018, 11:28 AM

## 2018-03-24 ENCOUNTER — Inpatient Hospital Stay (HOSPITAL_COMMUNITY): Payer: Medicare HMO

## 2018-03-24 DIAGNOSIS — G9340 Encephalopathy, unspecified: Secondary | ICD-10-CM

## 2018-03-24 DIAGNOSIS — T17908D Unspecified foreign body in respiratory tract, part unspecified causing other injury, subsequent encounter: Secondary | ICD-10-CM

## 2018-03-24 DIAGNOSIS — R410 Disorientation, unspecified: Secondary | ICD-10-CM

## 2018-03-24 LAB — FOLATE RBC
Folate, Hemolysate: 525.9 ng/mL
Folate, RBC: 1366 ng/mL (ref >498)
Hematocrit: 38.5 % (ref 37.5–51.0)

## 2018-03-24 LAB — BASIC METABOLIC PANEL
Anion gap: 8 (ref 5–15)
BUN: 30 mg/dL — ABNORMAL HIGH (ref 8–23)
CHLORIDE: 112 mmol/L — AB (ref 98–111)
CO2: 24 mmol/L (ref 22–32)
CREATININE: 0.83 mg/dL (ref 0.61–1.24)
Calcium: 8.4 mg/dL — ABNORMAL LOW (ref 8.9–10.3)
Glucose, Bld: 67 mg/dL — ABNORMAL LOW (ref 70–99)
Potassium: 3.9 mmol/L (ref 3.5–5.1)
SODIUM: 144 mmol/L (ref 135–145)

## 2018-03-24 LAB — MAGNESIUM: MAGNESIUM: 2.4 mg/dL (ref 1.7–2.4)

## 2018-03-24 MED ORDER — RESOURCE THICKENUP CLEAR PO POWD
ORAL | Status: DC | PRN
  Filled 2018-03-24: qty 125

## 2018-03-24 MED ORDER — PIPERACILLIN-TAZOBACTAM 3.375 G IVPB
3.3750 g | Freq: Three times a day (TID) | INTRAVENOUS | Status: AC
  Administered 2018-03-24 – 2018-03-26 (×5): 3.375 g via INTRAVENOUS
  Filled 2018-03-24 (×5): qty 50

## 2018-03-24 NOTE — Progress Notes (Signed)
PROGRESS NOTE    [name] Obeso Sr.  QBV:[id number] DOB: [birth year] DOA: 03/22/2018 PCP: Renaldo Reel, DO    Brief Narrative:  [name] Bail Sr. is a 84 y.o. male with medical history significant for COPD with chronic hypoxic respiratory failure, hypothyroidism, hypertension, and chronic mild confusion and memory loss, now presenting to the emergency department for evaluation of increased confusion, agitation, and hallucinations.   Patient's son at bedside reports that he has been having increased confusion with agitation and hallucinations since Saturday and has not slept since then.  Patient also had about 3 weeks of physical therapy at [facility] and was recently discharged home.  Psychiatric consulted for increased agitation, hallucinations and insomnia.   Assessment & Plan:   Principal Problem:   Altered mental status Active Problems:   COPD (chronic obstructive pulmonary disease) with emphysema (HCC)   Chronic respiratory failure (HCC)   HTN (hypertension)   Acute encephalopathy   Hypothyroidism   Acute encephalopathy Unclear etiology differential includes infection versus dehydration versus delirium.  Initial evaluation with CT head, negative for acute findings.  Patient is able to move all his extremities.  ABG does not show any hypercarbia.  There are no signs of infection so far.  His urine analysis is negative his chest x-ray is negative for pneumonia or acute changes. TSH within normal limits, B12 within normal limits, ammonia level is within normal limits.  Pt is a little alert today, able to answer his name and tell me his DOB . He reports the year is 71 and doesn't know where he is . When asked if he wants to eat, he said he wants to eat oatmeal. Recommend getting SLP eval AND start diet .   supportive care with IV fluids. Psychiatric consulted for increased agitation and hallucinations, recommended starting zyprexa if EKG is wnl.. Physical therapy evaluation  for deconditioning. IV Haldol ordered for agitation.  Dehydration: improving.   Insomnia:  Son reports that patient hasn't slept for 4 days and has been sleeping since admission.  Prn zyprexa ordered by psychiatry for agitation, delirium and insomnia.   Hypertension Suboptimally controlled currently on verapamil, use as needed hydralazine.   Hypothyroidism TSH within normal limits, resume home dose of Synthroid.    COPD, chronic bronchitis No wheezing heard on exam Albuterol as needed and azithromycin for bronchitis.    DVT prophylaxis: Lovenox Code Status: Full code Family Communication: none at bedside today. Disposition Plan: Pending PT evaluation    Consultants:   Psychiatric  Procedures: None  Antimicrobials: Azithromycin for bronchitis since admission.  Subjective: Just oriented to person, still very lethargic, does not appear to be in distress, still requiring 2 lit of [place] oxygen. Will wean him off the oxygen today.    Objective: Vitals:   03/23/18 2037 03/23/18 2111 03/24/18 0625 03/24/18 0848  BP:  (!) 152/68 (!) 155/64   Pulse:  70 (!) 59   Resp:  20 16   Temp:  98.8 F (37.1 C) 98.4 F (36.9 C)   TempSrc:  Oral Oral   SpO2: 93% 91% 93% 94%  Weight:      Height:        Intake/Output Summary (Last 24 hours) at 03/24/2018 1105 Last data filed at 03/24/2018 0400 Gross per 24 hour  Intake 1027.28 ml  Output 201 ml  Net 826.28 ml   Filed Weights   03/22/18 2251  Weight: 60.8 kg    Examination:  General exam: Appears calm and comfortable not in distress,  on 2 lit of [place] oxygen.  Respiratory system: diminished at bases, no wheezing or rhonchi.  Cardiovascular system: S1 & S2 heard, RRR.  No pedal edema. Gastrointestinal system: Abdomen is soft NT non distended bowel sounds good, PEG tube in place.  Central nervous system: Arousable, but lethargic, able to tell me his name and DOb, not oriented to place or time.  Extremities: able to move his  extremities. Skin: No rashes, lesions or ulcers Psychiatry: calm.     Data Reviewed: I have personally reviewed following labs and imaging studies  CBC: Recent Labs  Lab 03/22/18 1625  WBC 8.3  NEUTROABS 5.8  HGB 12.0*  HCT 36.5*  MCV 95.3  PLT 382   Basic Metabolic Panel: Recent Labs  Lab 03/22/18 1625 03/23/18 0344 03/24/18 0907  NA 143 142 144  K 4.3 4.2 3.9  CL 106 107 112*  CO2 28 25 24   GLUCOSE 91 104* 67*  BUN 24* 25* 30*  CREATININE 0.88 0.81 0.83  CALCIUM 9.3 9.0 8.4*  MG  --   --  2.4   GFR: Estimated Creatinine Clearance: 50.9 mL/min (by C-G formula based on SCr of 0.83 mg/dL). Liver Function Tests: Recent Labs  Lab 03/22/18 1625  AST 27  ALT 23  ALKPHOS 78  BILITOT 0.7  PROT 7.4  ALBUMIN 3.5   No results for input(s): LIPASE, AMYLASE in the last 168 hours. Recent Labs  Lab 03/22/18 2302  AMMONIA 20   Coagulation Profile: No results for input(s): INR, PROTIME in the last 168 hours. Cardiac Enzymes: No results for input(s): CKTOTAL, CKMB, CKMBINDEX, TROPONINI in the last 168 hours. BNP (last 3 results) Recent Labs    09/23/17 1139 01/12/18 1613  PROBNP 120.0* 123.0*   HbA1C: No results for input(s): HGBA1C in the last 72 hours. CBG: No results for input(s): GLUCAP in the last 168 hours. Lipid Profile: No results for input(s): CHOL, HDL, LDLCALC, TRIG, CHOLHDL, LDLDIRECT in the last 72 hours. Thyroid Function Tests: Recent Labs    03/22/18 2302  TSH 2.509   Anemia Panel: Recent Labs    03/22/18 2302  VITAMINB12 495   Sepsis Labs: No results for input(s): PROCALCITON, LATICACIDVEN in the last 168 hours.  No results found for this or any previous visit (from the past 240 hour(s)).       Radiology Studies: Dg Chest 2 View  Result Date: 03/22/2018 CLINICAL DATA:  Acute cough and weakness. EXAM: CHEST - 2 VIEW COMPARISON:  03/17/2018 and prior exams FINDINGS: This is a low volume study. Cardiomediastinal silhouette is  unchanged. Mild bibasilar opacities again noted-likely atelectasis/scarring. No pleural effusion, definite airspace disease or pneumothorax. IMPRESSION: Low volume study with mild bibasilar opacities again noted-likely atelectasis/scarring. Electronically Signed   By: Margarette Canada M.D.   On: 03/22/2018 18:13   Ct Head Wo Contrast  Result Date: 03/22/2018 CLINICAL DATA:  Altered mental status. EXAM: CT HEAD WITHOUT CONTRAST TECHNIQUE: Contiguous axial images were obtained from the base of the skull through the vertex without intravenous contrast. COMPARISON:  CT scan of October 06, 2012. FINDINGS: Brain: Mild diffuse cortical atrophy is noted. Mild chronic ischemic white matter disease is noted. No mass effect or midline shift is noted. Ventricular size is within normal limits. There is no evidence of mass lesion, hemorrhage or acute infarction. Vascular: No hyperdense vessel or unexpected calcification. Skull: Normal. Negative for fracture or focal lesion. Sinuses/Orbits: Opacification of left maxillary sinus is again noted consistent with chronic sinusitis. Other: None. IMPRESSION:  Mild diffuse cortical atrophy. Mild chronic ischemic white matter disease. No acute intracranial abnormality seen. Electronically Signed   By: Marijo Conception, M.D.   On: 03/22/2018 19:54        Scheduled Meds: . aspirin  325 mg Oral Daily  . azithromycin  250 mg Oral Daily  . budesonide (PULMICORT) nebulizer solution  0.25 mg Nebulization BID  . calcium-vitamin D  1 tablet Oral Daily  . docusate sodium  100 mg Oral BID  . enoxaparin (LOVENOX) injection  40 mg Subcutaneous QHS  . levothyroxine  100 mcg Oral QAC breakfast  . multivitamin with minerals  1 tablet Oral Daily  . pantoprazole  40 mg Oral Daily  . umeclidinium-vilanterol  1 puff Inhalation Daily  . verapamil  80 mg Oral TID   Continuous Infusions:    LOS: 1 day    Time spent: 28 minutes    Hosie Poisson, MD [facility] Pager  [phone]  If 7PM-7AM, please contact night-coverage [email or web address] Password [email or web address] 03/24/2018, 11:05 AM

## 2018-03-24 NOTE — Consult Note (Signed)
NAME:  [name] Biel., MRN:  [id number], DOB:  01/08/1925, LOS: 1 ADMISSION DATE:  03/22/2018, CONSULTATION DATE:  03/24/2018 REFERRING MD:  Karleen Hampshire, CHIEF COMPLAINT:  Confusion   Brief History   84 y/o male with a past medical history of recurrent aspiration pneumonia, COPD and CHF was admitted for further evaluation of agitated delirium  Past Medical History  Chronic kidney disease Anemia Hypothyroidism COPD Recurrent aspiration Recent right hip fracture Significant Hospital Events   October 8 admission  Consults: date of consult/date signed off & final recs:  October 10 pulmonary  Procedures (surgical and bedside):    Significant Diagnostic Tests:    Micro Data:    Antimicrobials:  10/10 Azithro>  10/10 Zosyn >    Subjective:  Has been sleepy all day  Objective   Blood pressure 140/71, pulse 73, temperature 98.4 F (36.9 C), temperature source Oral, resp. rate 20, height 5\' 6"  (1.676 m), weight 60.8 kg, SpO2 95 %.        Intake/Output Summary (Last 24 hours) at 03/24/2018 1554 Last data filed at 03/24/2018 0400 Gross per 24 hour  Intake 1027.28 ml  Output 201 ml  Net 826.28 ml   Filed Weights   03/22/18 2251  Weight: 60.8 kg    Examination: General:  Resting comfortably in chair HENT: NCAT OP clear PULM: Crackles bases B, normal effort CV: RRR, no mgr GI: BS+, soft, nontender MSK: normal bulk and tone Neuro: sleepy but arouses, tells me he is in [facility] Problem list     Assessment & Plan:  Acute agitated delirium: Now resolved, I question whether or not this could have been due to an occult aspiration event?  Now at this point appears to have nonagitated delirium with significant somnolence.  Question whether or not olanzapine could be contributing Consider holding olanzapine  Aspiration this afternoon: Continue swallowing evaluation and speech therapy Consider pured diet as this seems to work well for  him at home Check chest x-ray now Start Zosyn considering witnessed aspiration this afternoon and worsening cough Sit up in chair  COPD with pseudomonas colonization: Continue Anoro Continue daily azithromycin Continue albuterol as needed We will need trilogy on discharge  Chronic diastolic heart failure: Monitor for signs of volume overload Monitor ins and outs For now I agree with gentle IV fluids though may need to hold this tomorrow  Disposition / Summary of Today's Plan 03/24/18   Read above     Diet: dysphagia 3, nectar thick DVT prophylaxis: lovenox GI prophylaxis: n'a Hyperglycemia protocol: per [facility] Mobility: PT consult Code Status: Full for now Family Communication: updated son/daughter bedside  Labs   CBC: Recent Labs  Lab 03/22/18 1625 03/22/18 2302  WBC 8.3  --   NEUTROABS 5.8  --   HGB 12.0*  --   HCT 36.5* 38.5  MCV 95.3  --   PLT 156  --     Basic Metabolic Panel: Recent Labs  Lab 03/22/18 1625 03/23/18 0344 03/24/18 0907  NA 143 142 144  K 4.3 4.2 3.9  CL 106 107 112*  CO2 28 25 24   GLUCOSE 91 104* 67*  BUN 24* 25* 30*  CREATININE 0.88 0.81 0.83  CALCIUM 9.3 9.0 8.4*  MG  --   --  2.4   GFR: Estimated Creatinine Clearance: 50.9 mL/min (by C-G formula based on SCr of 0.83 mg/dL). Recent Labs  Lab 03/22/18 1625  WBC 8.3    Liver Function Tests: Recent  Labs  Lab 03/22/18 1625  AST 27  ALT 23  ALKPHOS 78  BILITOT 0.7  PROT 7.4  ALBUMIN 3.5   No results for input(s): LIPASE, AMYLASE in the last 168 hours. Recent Labs  Lab 03/22/18 2302  AMMONIA 20    ABG    Component Value Date/Time   PHART 7.438 03/22/2018 2013   PCO2ART 38.8 03/22/2018 2013   PO2ART 79.6 (L) 03/22/2018 2013   HCO3 25.8 03/22/2018 2013   O2SAT 95.4 03/22/2018 2013     Coagulation Profile: No results for input(s): INR, PROTIME in the last 168 hours.  Cardiac Enzymes: No results for input(s): CKTOTAL, CKMB, CKMBINDEX, TROPONINI in the last  168 hours.  HbA1C: No results found for: HGBA1C  CBG: No results for input(s): GLUCAP in the last 168 hours.  Admitting History of Present Illness.   This is a pleasant 84 year old male whom I know well from clinic.  He does not provide history because of confusion.  His family says that he was doing fairly well at home despite having it to mulch was here.  He is been hospitalized, has had several episodes of pneumonia and he had a hip fracture.  He also spent a long period of time in a skilled nursing facility. He was admitted here because earlier in the week he was becoming somewhat confused, not sleeping at night, and was irritable with family which is very unlike him.  There is no clear signs or symptoms of an underlying cause on admission and he has been monitored here.  Since admission he has had more of a quiet delirium picture and has been quite somnolent for several days.  He will wake up from time to time.  This afternoon he had a witnessed aspiration peer  Review of Systems:   Cannot obtain due to confusion  Past Medical History  He,  has a past medical history of Anemia, COPD (chronic obstructive pulmonary disease) ([place]), HTN (hypertension), and Hypothyroidism.   Surgical History    Past Surgical History:  Procedure Laterality Date  . CATARACT EXTRACTION Bilateral   . GALLBLADDER SURGERY  02/21/2011  . OTHER SURGICAL HISTORY     finger right pointer--laceration repair     Social History   Social History   Socioeconomic History  . Marital status: Married    Spouse name: Estill Bamberg  . Number of children: 5  . Years of education: Not on file  . Highest education level: Not on file  Occupational History  . Occupation: retired  Scientific laboratory technician  . Financial resource strain: Not on file  . Food insecurity:    Worry: Not on file    Inability: Not on file  . Transportation needs:    Medical: Not on file    Non-medical: Not on file  Tobacco Use  . Smoking status: Former  Smoker    Packs/day: 1.50    Years: 25.00    Pack years: 37.50    Types: Cigarettes    Last attempt to quit: 06/16/1971    Years since quitting: 46.8  . Smokeless tobacco: Never Used  . Tobacco comment: started at 84 years old  Substance and Sexual Activity  . Alcohol use: No  . Drug use: No  . Sexual activity: Never  Lifestyle  . Physical activity:    Days per week: Not on file    Minutes per session: Not on file  . Stress: Not on file  Relationships  . Social connections:    Talks  on phone: Not on file    Gets together: Not on file    Attends religious service: Not on file    Active member of club or organization: Not on file    Attends meetings of clubs or organizations: Not on file    Relationship status: Not on file  . Intimate partner violence:    Fear of current or ex partner: Not on file    Emotionally abused: Not on file    Physically abused: Not on file    Forced sexual activity: Not on file  Other Topics Concern  . Not on file  Social History Narrative   Lives with wife.   ,  reports that he quit smoking about 46 years ago. His smoking use included cigarettes. He has a 37.50 pack-year smoking history. He has never used smokeless tobacco. He reports that he does not drink alcohol or use drugs.   Family History   His family history includes COPD in his sister; Heart disease in his sister; Lung cancer in his sister; Lung disease in his mother; Other in his father. There is no history of Colon cancer.   Allergies Allergies  Allergen Reactions  . Codeine Nausea And Vomiting     Home Medications  Prior to Admission medications   Medication Sig Start Date End Date Taking? Authorizing Provider  albuterol (PROVENTIL) (5 MG/ML) 0.5% nebulizer solution Take 0.5 mLs (2.5 mg total) by nebulization every 6 (six) hours as needed for wheezing or shortness of breath. 11/02/17  Yes Juanito Doom, MD  aspirin 325 MG tablet Take 325 mg by mouth daily.   Yes [provider]  azithromycin (ZITHROMAX) 250 MG tablet Take 1 tablet (250 mg total) by mouth daily. 11/19/17  Yes Juanito Doom, MD  Calcium Carbonate-Vitamin D (CALCIUM-VITAMIN D) 500-200 MG-UNIT tablet Take 1 tablet by mouth daily.    Yes [provider]  Ferrous Sulfate (IRON) 28 MG TABS Take 1 tablet by mouth daily.   Yes [provider]  guaiFENesin (ROBITUSSIN) 100 MG/5ML liquid Take 200 mg by mouth 3 (three) times daily as needed for cough.   Yes [provider]  levothyroxine (SYNTHROID, LEVOTHROID) 100 MCG tablet Take 100 mcg by mouth daily.   Yes [provider]  Multiple Vitamin (MULTIVITAMIN) tablet Take 1 tablet by mouth daily.   Yes [provider]  omeprazole (PRILOSEC) 40 MG capsule TAKE 1 CAPSULE BY MOUTH ONCE DAILY 07/19/17  Yes Noralee Space, MD  TRELEGY ELLIPTA 100-62.5-25 MCG/INH AEPB INHALE 1 PUFF ONCE DAILY Patient taking differently: Take 1 puff by mouth daily.  09/06/17  Yes Juanito Doom, MD  verapamil (CALAN) 80 MG tablet Take 80 mg by mouth 3 (three) times daily.   Yes [provider]  Respiratory Therapy Supplies (FLUTTER) DEVI Use as directed 03/31/16   Parrett, Fonnie Mu, NP     Critical care time: n/a     Roselie Awkward, MD [facility] Pager: [phone] Cell: [phone] After 3pm or if no response, call [phone]

## 2018-03-24 NOTE — Evaluation (Signed)
Occupational Therapy Evaluation Patient Details Name: [name] Crossland Sr. MRN: [id number] DOB: 1924/07/26 Today's Date: 03/24/2018    History of Present Illness [name] Busbee Sr. is a 84 y.o. male with medical history significant for COPD , hypothyroidism, hypertension, and chronic mild confusion and memory loss, R hip fracture/ Hemiarthroplasty in July/19, presenting to the ED 03/22/18 for evaluation of increased confusion, agitation, and hallucinations.     Clinical Impression   Pt was admitted for the above. He was very pleasant during evaluation and followed all one step commands.  Pt is near his baseline for adls; however, he needs a little more assistance to stand, and he needs more assistance with transfers at this time. Family is available 24/7 and he was working with [facility] therapies. Will follow in acute setting focusing on dynamic standing balance/tolerance via ADL goals listed below    Follow Up Recommendations  Home health OT;Supervision/Assistance - 24 hour    Equipment Recommendations  None recommended by OT    Recommendations for Other Services       Precautions / Restrictions Precautions Precautions: Posterior Hip Precaution Comments: right hip hemiarthroplasty ? posterior, Has a PEG form hospital stay in july, not used per family Restrictions Weight Bearing Restrictions: No      Mobility Bed Mobility Overal bed mobility: Needs Assistance Bed Mobility: Supine to Sit     Supine to sit: Min assist Sit to supine: Min assist;Mod assist   General bed mobility comments: assist for legs; pt able to scoot over in bed  Transfers Overall transfer level: Needs assistance Equipment used: Rolling walker (2 wheeled) Transfers: Sit to/from [facility] Sit to Stand: Min assist Stand pivot transfers: Mod assist       General transfer comment: min A to stand; mod A for support with SPT to bed    Balance Overall balance assessment: Needs  assistance;History of Falls Sitting-balance support: Feet supported;No upper extremity supported Sitting balance-Leahy Scale: Good     Standing balance support: During functional activity;Bilateral upper extremity supported Standing balance-Leahy Scale: Poor Standing balance comment: relies on RW.                           ADL either performed or assessed with clinical judgement   ADL Overall ADL's : Needs assistance/impaired Eating/Feeding: Supervision/ safety   Grooming: Set up;Sitting   Upper Body Bathing: Set up;Sitting   Lower Body Bathing: Moderate assistance;Sit to/from stand   Upper Body Dressing : Minimal assistance;Sitting   Lower Body Dressing: Maximal assistance;Sit to/from stand   Toilet Transfer: Minimal assistance;Stand-pivot;RW(bed)             General ADL Comments: pt shaky with SPT back to bed.  He is near baseline for adls, but not for walking, standing activities     Vision         Perception     Praxis      Pertinent Vitals/Pain Pain Assessment: No/denies pain Faces Pain Scale: No hurt     Hand Dominance     Extremity/Trunk Assessment Upper Extremity Assessment Upper Extremity Assessment: Overall WFL for tasks assessed   Lower Extremity Assessment Lower Extremity Assessment: Generalized weakness   Cervical / Trunk Assessment Cervical / Trunk Assessment: Kyphotic;Other exceptions Cervical / Trunk Exceptions: neck tending to be in extension   Communication     Cognition Arousal/Alertness: Awake/alert Behavior During Therapy: WFL for tasks assessed/performed Overall Cognitive Status: History of cognitive impairments - at baseline  General Comments: was oriented to [facility] after daughter told him that he was across street from his doctor. family reports that the patient gets up without assist at times even thogh not supposed to PTA. Has not had any falls since returned home from  rehab.   General Comments       Exercises     Shoulder Instructions      Home Living Family/patient expects to be discharged to:: Private residence Living Arrangements: Children Available Help at Discharge: Family;Available 24 hours/day Type of Home: House Home Access: Stairs to enter [employer] of Steps: 3 Entrance Stairs-Rails: Right;Left Home Layout: One level               Home Equipment: Bedside commode;Shower seat   Additional Comments: was at [facility]  after hip fracture in july, was in hospital postop for a month per family. Was receiving HHOT/PT/ST      Prior Functioning/Environment Level of Independence: Needs assistance  Gait / Transfers Assistance Needed: ambulated w/ RW and min guard at all times, able to go up/down steps with assit. ADL's / Homemaking Assistance Needed: sonn assisted with showering.  Family assisted with LB clothing, but pt would stand and pull pants up himself            OT Problem List: Decreased strength;Decreased activity tolerance;Impaired balance (sitting and/or standing);Decreased cognition      OT Treatment/Interventions: Self-care/ADL training;Energy conservation;DME and/or AE instruction;Patient/family education;Balance training;Therapeutic activities;Cognitive remediation/compensation    OT Goals(Current goals can be found in the care plan section) Acute Rehab OT Goals Patient Stated Goal: family's:  aggressive therapy to get pt stronger OT Goal Formulation: With family Time For Goal Achievement: 04/07/18 Potential to Achieve Goals: Good ADL Goals Pt Will Perform Grooming: with min guard assist;standing Pt Will Transfer to Toilet: with min assist;bedside commode;ambulating Pt Will Perform Toileting - Clothing Manipulation and hygiene: with min guard assist;sit to/from stand  OT Frequency: Min 2X/week   Barriers to D/C:            Co-evaluation              AM-PAC PT "6 Clicks" Daily Activity      Outcome Measure Help from another person eating meals?: A Little Help from another person taking care of personal grooming?: A Little Help from another person toileting, which includes using toliet, bedpan, or urinal?: A Lot Help from another person bathing (including washing, rinsing, drying)?: A Lot Help from another person to put on and taking off regular upper body clothing?: A Little Help from another person to put on and taking off regular lower body clothing?: A Lot 6 Click Score: 15   End of Session    Activity Tolerance: Patient limited by fatigue Patient left: in bed;with call bell/phone within reach;with bed alarm set;with family/visitor present  OT Visit Diagnosis: Unsteadiness on feet (R26.81);Muscle weakness (generalized) (M62.81)                Time: 7681-1572 OT Time Calculation (min): 19 min Charges:  OT General Charges $OT Visit: 1 Visit OT Evaluation $OT Eval Low Complexity: [place], OTR/L Acute Rehabilitation Services [phone] [facility] pager [phone] office 03/24/2018  [place] 03/24/2018, 3:34 PM

## 2018-03-24 NOTE — Evaluation (Signed)
Clinical/Bedside Swallow Evaluation Patient Details  Name: [name] [name]. MRN: [id number] Date of Birth: August 15, 1924  Today's Date: 03/24/2018 Time: SLP Start Time (ACUTE ONLY): 1115 SLP Stop Time (ACUTE ONLY): 1205 SLP Time Calculation (min) (ACUTE ONLY): 50 min  Past Medical History:  Past Medical History:  Diagnosis Date  . Anemia   . COPD (chronic obstructive pulmonary disease) (HCC)    Mild  . HTN (hypertension)   . Hypothyroidism    Past Surgical History:  Past Surgical History:  Procedure Laterality Date  . CATARACT EXTRACTION Bilateral   . GALLBLADDER SURGERY  02/21/2011  . OTHER SURGICAL HISTORY     finger right pointer--laceration repair   HPI:  pt is a 84 yo male adm to [facility] with AMS - pt had not slept for several days prior to admit per family.  Pt with PMH + for hip fx s/p surgery with post-op dysphagia requiring feeding tube, COPD, aspiration pna.   Patient has had several swallow studies completed in the past with last one recommending dys3/nectar diet.  He recently had a "touch" of pna and was treated with ABX per daughter.   Assessment / Plan / Recommendation Clinical Impression  Pt with h/o chronic dysphagia with aspiration *chronic* and is under the care of home SLP.  Pt is on a modified diet to mitigate dysphagia/aspiration.  Pt follows strict aspiration precautions.  Pt continues with symptoms of aspiration with thinner consistencies more than thicker.  Recommend continue modified diet with strict precautions.   SLP Visit Diagnosis: Dysphagia, pharyngeal phase (R13.13)    Aspiration Risk  Mild aspiration risk    Diet Recommendation Dysphagia 3 (Mech soft);Nectar-thick liquid   Liquid Administration via: Cup Medication Administration: Crushed with puree Supervision: Patient able to self feed Compensations: Slow rate;Small sips/bites Postural Changes: Seated upright at 90 degrees;Remain upright for at least 30 minutes after po intake    Other   Recommendations Oral Care Recommendations: Oral care BID   Follow up Recommendations    tbd    Frequency and Duration min 1 x/week  1 week       Prognosis Prognosis for Safe Diet Advancement: Fair Barriers to Reach Goals: Severity of deficits;Time post onset      Swallow Study   General Date of Onset: 03/24/18 HPI: pt is a 84 yo male adm to [facility] with AMS - pt had not slept for several days prior to admit per family.  Pt with PMH + for hip fx s/p surgery with post-op dysphagia requiring feeding tube, COPD, aspiration pna.   Patient has had several swallow studies completed in the past with last one recommending dys3/nectar diet.  He recently had a "touch" of pna and was treated with ABX per daughter. Diet Prior to this Study: Regular;Thin liquids Temperature Spikes Noted: No Respiratory Status: Room air History of Recent Intubation: No Behavior/Cognition: Cooperative;Pleasant mood;Alert Oral Cavity Assessment: Dry;Dried secretions Oral Care Completed by SLP: Yes Oral Cavity - Dentition: Edentulous Vision: Functional for self-feeding Self-Feeding Abilities: Able to feed self Patient Positioning: Upright in chair Baseline Vocal Quality: (weak) Volitional Cough: Strong(productive at times) Volitional Swallow: Able to elicit    Oral/Motor/Sensory Function Overall Oral Motor/Sensory Function: Generalized oral weakness(mild weakness)   Ice Chips Ice chips: Impaired Presentation: Spoon Pharyngeal Phase Impairments: Cough - Immediate   Thin Liquid Thin Liquid: Not tested    Nectar Thick Nectar Thick Liquid: Impaired Pharyngeal Phase Impairments: Cough - Delayed;Multiple swallows;Other (comments) Other Comments: audible swallow   Honey Thick Honey  Thick Liquid: Impaired Pharyngeal Phase Impairments: Multiple swallows;Other (comments) Other Comments: audible swallow   Puree Puree: Impaired Presentation: Spoon;Self Fed Pharyngeal Phase Impairments: Multiple swallows   Solid      Solid: Impaired Presentation: Self Fed      [name] [name] 03/24/2018,1:00 PM   Luanna Salk, MS [facility] SLP [place] Pager [phone] Office [phone]

## 2018-03-24 NOTE — Evaluation (Signed)
Physical Therapy Evaluation Patient Details Name: [name] Cahall Sr. MRN: [id number] DOB: 1924/12/23 Today's Date: 03/24/2018   History of Present Illness  [name] Cadman Sr. is a 84 y.o. male with medical history significant for COPD , hypothyroidism, hypertension, and chronic mild confusion and memory loss, R hip fracture/ Hemiarthroplasty in July/19, presenting to the ED 03/22/18 for evaluation of increased confusion, agitation, and hallucinations.    Clinical Impression  The patient 's son and daughter/caregivers present. Patient assisted to recliner with RW and 1 mod. Assist. Per family, plans to return home if able to improve with ambulation with 1 assist for house distances. Pt admitted with above diagnosis. Pt currently with functional limitations due to the deficits listed below (see PT Problem List).  Pt will benefit from skilled PT to increase their independence and safety with mobility to allow discharge to the venue listed below.       Follow Up Recommendations Home health PT;Supervision/Assistance - 24 hour,patient had HHPT ongoing.    Equipment Recommendations  None recommended by PT    Recommendations for Other Services   OT    Precautions / Restrictions Precautions Precautions: Posterior Hip Precaution Comments: right hip hemiarthroplasty ? posterior, Has a PEG form hospital stay in july, not used per family      Mobility  Bed Mobility Overal bed mobility: Needs Assistance Bed Mobility: Supine to Sit     Supine to sit: Min assist     General bed mobility comments: patient able to move legs to bed edge . gentle assist to scoot to edge.  Transfers Overall transfer level: Needs assistance Equipment used: Rolling walker (2 wheeled) Transfers: Sit to/from [facility] Sit to Stand: Mod assist Stand pivot transfers: Mod assist       General transfer comment: mod assist to rise and steady. Right leg with some buckling when taking small steps to  the recliner.  Ambulation/Gait                Stairs            Wheelchair Mobility    Modified Rankin (Stroke Patients Only)       Balance Overall balance assessment: Needs assistance;History of Falls Sitting-balance support: Feet supported;No upper extremity supported Sitting balance-Leahy Scale: Good     Standing balance support: During functional activity;Bilateral upper extremity supported Standing balance-Leahy Scale: Poor Standing balance comment: relies on RW.                             Pertinent Vitals/Pain Pain Assessment: No/denies pain    Home Living Family/patient expects to be discharged to:: Private residence Living Arrangements: Children Available Help at Discharge: Family;Available 24 hours/day Type of Home: House Home Access: Stairs to enter Entrance Stairs-Rails: Psychiatric nurse of Steps: 3 Home Layout: One level Home Equipment: Walker - 2 wheels;Wheelchair - manual;Shower seat;Bedside commode Additional Comments: was at [employer]  after hip fracture in july, was in hospital postop for a month per family    Prior Function Level of Independence: Needs assistance   Gait / Transfers Assistance Needed: ambulated w/ RW and min guard at all times, able to go up/down steps with assit.  ADL's / Homemaking Assistance Needed: son assisted in shower ,seated        Hand Dominance        Extremity/Trunk Assessment   Upper Extremity Assessment Upper Extremity Assessment: Defer to OT evaluation    Lower Extremity  Assessment Lower Extremity Assessment: Generalized weakness    Cervical / Trunk Assessment Cervical / Trunk Assessment: Kyphotic;Other exceptions Cervical / Trunk Exceptions: neck tending to be in extension  Communication      Cognition Arousal/Alertness: Awake/alert Behavior During Therapy: WFL for tasks assessed/performed Overall Cognitive Status: History of cognitive impairments - at  baseline                                 General Comments: was oriented to [facility] after daughter told him that he was across street from his doctor. family reports that the patient gets up without assist at times even thogh not supposed to PTA. Has not had any falls since returned home from rehab.      General Comments      Exercises     Assessment/Plan    PT Assessment Patient needs continued PT services  PT Problem List Decreased strength;Decreased range of motion;Decreased activity tolerance;Decreased knowledge of use of DME;Decreased balance;Decreased safety awareness;Decreased mobility;Decreased knowledge of precautions       PT Treatment Interventions DME instruction;Therapeutic exercise;Gait training;Stair training;Functional mobility training;Therapeutic activities;Patient/family education    PT Goals (Current goals can be found in the Care Plan section)  Acute Rehab PT Goals Patient Stated Goal: to return home PT Goal Formulation: With patient/family Time For Goal Achievement: 04/07/18 Potential to Achieve Goals: Fair    Frequency Min 3X/week   Barriers to discharge        Co-evaluation               AM-PAC PT "6 Clicks" Daily Activity  Outcome Measure Difficulty turning over in bed (including adjusting bedclothes, sheets and blankets)?: A Lot Difficulty moving from lying on back to sitting on the side of the bed? : A Lot Difficulty sitting down on and standing up from a chair with arms (e.g., wheelchair, bedside commode, etc,.)?: A Lot Help needed moving to and from a bed to chair (including a wheelchair)?: Total Help needed walking in hospital room?: Total Help needed climbing 3-5 steps with a railing? : Total 6 Click Score: 9    End of Session Equipment Utilized During Treatment: Gait belt Activity Tolerance: Patient tolerated treatment well Patient left: in chair;with call bell/phone within reach;with chair alarm set;with family/visitor  present Nurse Communication: Mobility status PT Visit Diagnosis: Unsteadiness on feet (R26.81)    Time: 4970-2637 PT Time Calculation (min) (ACUTE ONLY): 27 min   Charges:   PT Evaluation $PT Eval Low Complexity: 1 Low PT Treatments $Therapeutic Activity: 8-22 mins        Tresa Endo PT Acute Rehabilitation Services Pager [phone] Office [phone]   Claretha Cooper 03/24/2018, 12:43 PM

## 2018-03-24 NOTE — Progress Notes (Signed)
Pharmacy Antibiotic Note  [name] Miano Sr. is a 84 y.o. male with a h/o recurrent aspiration PNA admitted on 03/22/2018 with agitated delirium. Pharmacy has been consulted for Zosyn dosing.  Plan: Zosyn 3.375 g EI q 8 hours.   Pharmacy will sign-off with no need for further dosage adjustment. Thank you for the consult.   Height: 5\' 6"  (167.6 cm) Weight: 134 lb 0.6 oz (60.8 kg) IBW/kg (Calculated) : 63.8  Temp (24hrs), Avg:98.5 F (36.9 C), Min:98.4 F (36.9 C), Max:98.8 F (37.1 C)  Recent Labs  Lab 03/22/18 1625 03/23/18 0344 03/24/18 0907  WBC 8.3  --   --   CREATININE 0.88 0.81 0.83    Estimated Creatinine Clearance: 50.9 mL/min (by C-G formula based on SCr of 0.83 mg/dL).    Allergies  Allergen Reactions  . Codeine Nausea And Vomiting    Antimicrobials this admission: Zosyn 10/10 >>  azithromycin 10/10 >>   Dose adjustments this admission:   Microbiology results:  Thank you for allowing pharmacy to be a part of this patient's care.  Ulice Dash D 03/24/2018 4:08 PM

## 2018-03-25 DIAGNOSIS — J439 Emphysema, unspecified: Secondary | ICD-10-CM

## 2018-03-25 DIAGNOSIS — J9611 Chronic respiratory failure with hypoxia: Secondary | ICD-10-CM

## 2018-03-25 LAB — BASIC METABOLIC PANEL
Anion gap: 7 (ref 5–15)
BUN: 32 mg/dL — ABNORMAL HIGH (ref 8–23)
CALCIUM: 8.3 mg/dL — AB (ref 8.9–10.3)
CHLORIDE: 113 mmol/L — AB (ref 98–111)
CO2: 23 mmol/L (ref 22–32)
CREATININE: 0.95 mg/dL (ref 0.61–1.24)
GFR calc Af Amer: >60 mL/min (ref >60)
GFR calc non Af Amer: >60 mL/min (ref >60)
GLUCOSE: 96 mg/dL (ref 70–99)
Potassium: 4.1 mmol/L (ref 3.5–5.1)
Sodium: 143 mmol/L (ref 135–145)

## 2018-03-25 LAB — CBC
HEMATOCRIT: 31.3 % — AB (ref 39.0–52.0)
HEMOGLOBIN: 9.9 g/dL — AB (ref 13.0–17.0)
MCH: 30.8 pg (ref 26.0–34.0)
MCHC: 31.6 g/dL (ref 30.0–36.0)
MCV: 97.5 fL (ref 80.0–100.0)
Platelets: 145 10*3/uL — ABNORMAL LOW (ref 150–400)
RBC: 3.21 MIL/uL — ABNORMAL LOW (ref 4.22–5.81)
RDW: 12.7 % (ref 11.5–15.5)
WBC: 7 10*3/uL (ref 4.0–10.5)
nRBC: 0 % (ref 0.0–0.2)

## 2018-03-25 MED ORDER — SODIUM CHLORIDE 0.9 % IV SOLN
INTRAVENOUS | Status: DC | PRN
  Administered 2018-03-25: 1000 mL via INTRAVENOUS

## 2018-03-25 MED ORDER — AMOXICILLIN-POT CLAVULANATE 250-62.5 MG/5ML PO SUSR
875.0000 mg | Freq: Two times a day (BID) | ORAL | Status: DC
  Administered 2018-03-26 – 2018-03-27 (×3): 875 mg via ORAL
  Filled 2018-03-25 (×3): qty 17.5

## 2018-03-25 NOTE — Progress Notes (Signed)
Occupational Therapy Treatment Patient Details Name: [name] Viana Sr. MRN: [id number] DOB: 03/11/1925 Today's Date: 03/25/2018    History of present illness 84 y.o. male with medical history significant for COPD , hypothyroidism, hypertension, and chronic mild confusion and memory loss, R hip fracture/ Hemiarthroplasty in July/19, presenting to the ED 03/22/18 for evaluation of increased confusion, agitation, and hallucinations.     OT comments  Pt agreeable to working with therapy:  Ambulated in hall with min A +2 safety. Worked on sit to stand twice with min A.  Family's priority is ambulating/standing balance and activity tolerance  Follow Up Recommendations  Home health OT;Supervision/Assistance - 24 hour    Equipment Recommendations  None recommended by OT    Recommendations for Other Services      Precautions / Restrictions Precautions Precautions: Posterior Hip Precaution Comments: right hip hemiarthroplasty 11/2017 posterior, Has a PEG form hospital stay in july, not used per family Restrictions Weight Bearing Restrictions: No       Mobility Bed Mobility Overal bed mobility: Needs Assistance Bed Mobility: Sit to Supine       Sit to supine: Min assist   General bed mobility comments: assist for LEs  Transfers Overall transfer level: Needs assistance Equipment used: Rolling walker (2 wheeled) Transfers: Sit to/from Stand Sit to Stand: Min assist         General transfer comment: Assist to steady. VCS safety, hand placement    Balance                                           ADL either performed or assessed with clinical judgement   ADL                           Toilet Transfer: Minimal assistance;+2 for safety/equipment;Ambulation;RW(simulated, bed)             General ADL Comments: Pt ambulated in hall with min A, +2 safety for lines.  Cues for step length and wider BOS.  Pt asked for catheter to be placed prior to  ambulating:  did not use toilet this session     Vision       Perception     Praxis      Cognition Arousal/Alertness: Awake/alert Behavior During Therapy: WFL for tasks assessed/performed Overall Cognitive Status: History of cognitive impairments - at baseline                                          Exercises     Shoulder Instructions       General Comments took one seated rest break; pt with one coughing spell.  Sats in 90s on 3 liters 02    Pertinent Vitals/ Pain       Pain Assessment: No/denies pain  Home Living                                          Prior Functioning/Environment              Frequency           Progress Toward Goals  OT Goals(current goals can now be found  in the care plan section)  Progress towards OT goals: Progressing toward goals     Plan      Co-evaluation    PT/OT/SLP Co-Evaluation/Treatment: Yes Reason for Co-Treatment: For patient/therapist safety PT goals addressed during session: Mobility/safety with mobility OT goals addressed during session: Strengthening/ROM      AM-PAC PT "6 Clicks" Daily Activity     Outcome Measure   Help from another person eating meals?: A Little Help from another person taking care of personal grooming?: A Little Help from another person toileting, which includes using toliet, bedpan, or urinal?: A Lot Help from another person bathing (including washing, rinsing, drying)?: A Lot Help from another person to put on and taking off regular upper body clothing?: A Little Help from another person to put on and taking off regular lower body clothing?: A Lot 6 Click Score: 15    End of Session    OT Visit Diagnosis: Unsteadiness on feet (R26.81);Muscle weakness (generalized) (M62.81)   Activity Tolerance Patient tolerated treatment well   Patient Left in bed;with call bell/phone within reach;with bed alarm set;with family/visitor present   Nurse  Communication          Time: 8341-9622 OT Time Calculation (min): 19 min  Charges: OT General Charges $OT Visit: 1 Visit OT Treatments $Therapeutic Activity: 8-22 mins  Lesle Chris, OTR/L Acute Rehabilitation Services [phone] [place] pager [phone] office 03/25/2018   [name] [name] 03/25/2018, 3:20 PM

## 2018-03-25 NOTE — Progress Notes (Addendum)
Physical Therapy Treatment Patient Details Name: [name] Erman Sr. MRN: [id number] DOB: [birth year] Today's Date: 03/25/2018    History of Present Illness 84 y.o. male with medical history significant for COPD , hypothyroidism, hypertension, and chronic mild confusion and memory loss, R hip fracture/ Hemiarthroplasty in July/19, presenting to the ED 03/22/18 for evaluation of increased confusion, agitation, and hallucinations.      PT Comments    Progressing with mobility.    Follow Up Recommendations  Home health PT;Supervision/Assistance - 24 hour     Equipment Recommendations  None recommended by PT    Recommendations for Other Services       Precautions / Restrictions Precautions Precautions: Posterior Hip Precaution Comments: right hip hemiarthroplasty 11/2017 posterior, Has a PEG form hospital stay in july, not used per family Restrictions Weight Bearing Restrictions: No    Mobility  Bed Mobility Overal bed mobility: Needs Assistance Bed Mobility: Sit to Supine       Sit to supine: Min assist   General bed mobility comments: assist for LEs  Transfers Overall transfer level: Needs assistance Equipment used: Rolling walker (2 wheeled) Transfers: Sit to/from Stand Sit to Stand: Min assist         General transfer comment: Assist to steady. VCS safety, hand placement  Ambulation/Gait Ambulation/Gait assistance: Min assist Gait Distance (Feet): 75 Feet(60'x1, 75'x1) Assistive device: Rolling walker (2 wheeled) Gait Pattern/deviations: Step-through pattern;Trunk flexed;Narrow base of support     General Gait Details: Assist to steady pt and maneuver with RW. Remained on  O2-93% on RA, dyspnea 2/4. One seated rest break   Stairs             Wheelchair Mobility    Modified Rankin (Stroke Patients Only)       Balance           Standing balance support: Bilateral upper extremity supported Standing balance-Leahy Scale: Poor                              Cognition Arousal/Alertness: Awake/alert Behavior During Therapy: WFL for tasks assessed/performed Overall Cognitive Status: History of cognitive impairments - at baseline                                        Exercises      General Comments General comments (skin integrity, edema, etc.): took one seated rest break; pt with one coughing spell.  Sats in 90s on 3 liters 02      Pertinent Vitals/Pain Pain Assessment: No/denies pain    Home Living                      Prior Function            PT Goals (current goals can now be found in the care plan section) Progress towards PT goals: Progressing toward goals    Frequency    Min 3X/week      PT Plan Current plan remains appropriate    Co-evaluation   Reason for Co-Treatment: For patient/therapist safety PT goals addressed during session: Mobility/safety with mobility OT goals addressed during session: Strengthening/ROM      AM-PAC PT "6 Clicks" Daily Activity  Outcome Measure  Difficulty turning over in bed (including adjusting bedclothes, sheets and blankets)?: A Lot Difficulty moving from lying on back to sitting on the side  of the bed? : A Lot Difficulty sitting down on and standing up from a chair with arms (e.g., wheelchair, bedside commode, etc,.)?: Unable Help needed moving to and from a bed to chair (including a wheelchair)?: A Little Help needed walking in hospital room?: A Little Help needed climbing 3-5 steps with a railing? : Total 6 Click Score: 12    End of Session Equipment Utilized During Treatment: Gait belt Activity Tolerance: Patient tolerated treatment well Patient left: in bed;with call bell/phone within reach;with bed alarm set;with family/visitor present   PT Visit Diagnosis: Unsteadiness on feet (R26.81);Muscle weakness (generalized) (M62.81)     Time: 1537-9432 PT Time Calculation (min) (ACUTE ONLY): 18 min  Charges:                           Weston Anna, PT Acute Rehabilitation Services Pager: [phone] Office: [phone]

## 2018-03-25 NOTE — Progress Notes (Signed)
NAME:  [name] Nazir., MRN:  [id number], DOB:  [birth year], LOS: 2 ADMISSION DATE:  03/22/2018, CONSULTATION DATE:  03/24/2018 REFERRING MD:  Karleen Hampshire, CHIEF COMPLAINT:  Confusion   Brief History   84 y/o male with a past medical history of recurrent aspiration pneumonia, COPD and CHF was admitted for further evaluation of agitated delirium  Past Medical History  Chronic kidney disease, Anemia, Hypothyroidism, COPD, Recurrent aspiration, Recent right hip fracture Significant Hospital Events   October 8 admission  Consults: date of consult/date signed off & final recs:  October 10 pulmonary  Procedures (surgical and bedside):    Significant Diagnostic Tests:    Micro Data:    Antimicrobials:  10/10 Azithro>  10/10 Zosyn >    Subjective:  More awake   Objective   Blood pressure (Abnormal) 141/70, pulse (Abnormal) 58, temperature 98.7 F (37.1 C), temperature source Oral, resp. rate 16, height 5\' 6"  (1.676 m), weight 60.8 kg, SpO2 93 %.        Intake/Output Summary (Last 24 hours) at 03/25/2018 1216 Last data filed at 03/25/2018 0830 Gross per 24 hour  Intake 755.85 ml  Output no documentation  Net 755.85 ml   Filed Weights   03/22/18 2251  Weight: 60.8 kg    Examination:  General 84 year old white male. Sleepy but not in acute distress.  HENT: NCAT no JVD MMM Pulm: scattered rhonchi no accessory use  Card: RRR no MRG abd soft not tender + bowel sounds Ext: warm and dry LE edema  Gu: clear yellow  Neuro: awake but sleepy. Moves all extremities. No focal def.    Resolved Hospital Problem list     Assessment & Plan:  Acute agitated delirium complicated by hypoactive delirium/toxic encephalopathy possibly exacerbated by Zyprexa -Wonder if this was occult infection initially, now treated.  Hypoactive delirium seems to be following Zyprexa.  This does have a very long half-life it appears as though last dose would have been on the ninth, the half-life can  be anywhere from 21 to in excess of 50 hours.   Plan Cont supportive care  Dysphagia w/ aspiration event Plan Cont aspiration precautions Cont abx   COPD with pseudomonas colonization: Plan Cont anoro Holding daily azithro; can resume after cxr.  Will need trilogy on dc    Chronic diastolic heart failure: Plan Monitor for element of vol overload.   Disposition / Summary of Today's Plan 03/25/18   Read above     Diet: dysphagia 3, nectar thick DVT prophylaxis: lovenox GI prophylaxis: n'a Hyperglycemia protocol: per [facility] Mobility: PT consult Code Status: Full for now Family Communication: updated son/daughter bedside  Labs   CBC: Recent Labs  Lab 03/22/18 1625 03/22/18 2302 03/25/18 0347  WBC 8.3  --  7.0  NEUTROABS 5.8  --   --   HGB 12.0*  --  9.9*  HCT 36.5* 38.5 31.3*  MCV 95.3  --  97.5  PLT 156  --  145*    Basic Metabolic Panel: Recent Labs  Lab 03/22/18 1625 03/23/18 0344 03/24/18 0907 03/25/18 0347  NA 143 142 144 143  K 4.3 4.2 3.9 4.1  CL 106 107 112* 113*  CO2 28 25 24 23   GLUCOSE 91 104* 67* 96  BUN 24* 25* 30* 32*  CREATININE 0.88 0.81 0.83 0.95  CALCIUM 9.3 9.0 8.4* 8.3*  MG  --   --  2.4  --    GFR: Estimated Creatinine Clearance: 44.4 mL/min (by C-G formula based  on SCr of 0.95 mg/dL). Recent Labs  Lab 03/22/18 1625 03/25/18 0347  WBC 8.3 7.0    Liver Function Tests: Recent Labs  Lab 03/22/18 1625  AST 27  ALT 23  ALKPHOS 78  BILITOT 0.7  PROT 7.4  ALBUMIN 3.5   No results for input(s): LIPASE, AMYLASE in the last 168 hours. Recent Labs  Lab 03/22/18 2302  AMMONIA 20    ABG    Component Value Date/Time   PHART 7.438 03/22/2018 2013   PCO2ART 38.8 03/22/2018 2013   PO2ART 79.6 (L) 03/22/2018 2013   HCO3 25.8 03/22/2018 2013   O2SAT 95.4 03/22/2018 2013     Coagulation Profile: No results for input(s): INR, PROTIME in the last 168 hours.  Cardiac Enzymes: No results for input(s): CKTOTAL, CKMB,  CKMBINDEX, TROPONINI in the last 168 hours.  HbA1C: No results found for: HGBA1C  CBG: No results for input(s): GLUCAP in the last 168 hours.  Erick Colace ACNP-BC [place] Pager # [phone] OR # [phone] if no answer

## 2018-03-25 NOTE — Progress Notes (Signed)
PROGRESS NOTE    [name] Ruffins Sr.  CVE:[id number] DOB: 1924/06/23 DOA: 03/22/2018 PCP: Renaldo Reel, DO    Brief Narrative:  [name] Marcell Sr. is a 84 y.o. male with medical history significant for COPD with chronic hypoxic respiratory failure, hypothyroidism, hypertension, and chronic mild confusion and memory loss, now presenting to the emergency department for evaluation of increased confusion, agitation, and hallucinations.   Patient's son at bedside reports that he has been having increased confusion with agitation and hallucinations since Saturday and has not slept since then.  Patient also had about 3 weeks of physical therapy at [facility] and was recently discharged home.  Psychiatric consulted for increased agitation, hallucinations and insomnia.   Assessment & Plan:   Principal Problem:   Altered mental status Active Problems:   COPD (chronic obstructive pulmonary disease) with emphysema (HCC)   Chronic respiratory failure (HCC)   HTN (hypertension)   Acute encephalopathy   Hypothyroidism   Acute encephalopathy Unclear etiology differential includes infection versus dehydration versus delirium.  Initial evaluation with CT head, negative for acute findings.  Patient is able to move all his extremities.  ABG does not show any hypercarbia.  There are no signs of infection so far.  His urine analysis is negative his chest x-ray is negative for pneumonia or acute changes. TSH within normal limits, B12 within normal limits, ammonia level is within normal limits.  Recommend getting SLP eval AND started diet .  Psychiatric consulted for increased agitation and hallucinations, recommended starting zyprexa if EKG is wnl.. Physical therapy evaluation for deconditioning. Pt yesterday had a coughing spell and possible aspiration event and CXR this am shows right infiltrate , possibly aspiration pneumonia.  PCCM on board, and started him on IV zosyn.  Recommend to continue  zosyn for another 24 hours and transition to oral augmentin to complete the course on discharge.   Dehydration: improving.   Insomnia:  Appears to have resolved.  Prn zyprexa ordered by psychiatry for agitation, delirium and insomnia.   Hypertension Better controlled. .   Hypothyroidism TSH within normal limits, resume home dose of Synthroid.    COPD, chronic bronchitis No wheezing heard on exam Albuterol as needed and azithromycin for bronchitis.    DVT prophylaxis: Lovenox Code Status: Full code Family Communication: none at bedside today. Disposition Plan: Pending PT evaluation    Consultants:   Psychiatric  Procedures: None  Antimicrobials: Azithromycin for bronchitis since admission.  Subjective: Alert and answering questions appropriately.   Objective: Vitals:   03/24/18 2004 03/25/18 0629 03/25/18 0723 03/25/18 1300  BP: 139/63 (!) 141/70  138/62  Pulse: (!) 59 (!) 58  (!) 50  Resp: 16 16  16   Temp: 98.2 F (36.8 C) 98.7 F (37.1 C)  97.9 F (36.6 C)  TempSrc: Oral Oral  Oral  SpO2: 99% 97% 93% 97%  Weight:      Height:        Intake/Output Summary (Last 24 hours) at 03/25/2018 1919 Last data filed at 03/25/2018 1800 Gross per 24 hour  Intake 896.43 ml  Output 300 ml  Net 596.43 ml   Filed Weights   03/22/18 2251  Weight: 60.8 kg    Examination:  General exam: Appears calm and comfortable not in distress, on 2 lit of [place] oxygen.  Respiratory system:  Diminished at bases, scattered rhonchi on the right.  Cardiovascular system: S1 & S2 heard, RRR.  No pedal edema. Gastrointestinal system: Abdomen is soft NT non distended bowel sounds  good, PEG tube in place.  Central nervous system: alert and answering questions appropriately  Extremities: able to move his extremities. Working with PT.  Skin: No rashes, lesions or ulcers Psychiatry: calm.     Data Reviewed: I have personally reviewed following labs and imaging studies  CBC: Recent  Labs  Lab 03/22/18 1625 03/22/18 2302 03/25/18 0347  WBC 8.3  --  7.0  NEUTROABS 5.8  --   --   HGB 12.0*  --  9.9*  HCT 36.5* 38.5 31.3*  MCV 95.3  --  97.5  PLT 156  --  892*   Basic Metabolic Panel: Recent Labs  Lab 03/22/18 1625 03/23/18 0344 03/24/18 0907 03/25/18 0347  NA 143 142 144 143  K 4.3 4.2 3.9 4.1  CL 106 107 112* 113*  CO2 28 25 24 23   GLUCOSE 91 104* 67* 96  BUN 24* 25* 30* 32*  CREATININE 0.88 0.81 0.83 0.95  CALCIUM 9.3 9.0 8.4* 8.3*  MG  --   --  2.4  --    GFR: Estimated Creatinine Clearance: 44.4 mL/min (by C-G formula based on SCr of 0.95 mg/dL). Liver Function Tests: Recent Labs  Lab 03/22/18 1625  AST 27  ALT 23  ALKPHOS 78  BILITOT 0.7  PROT 7.4  ALBUMIN 3.5   No results for input(s): LIPASE, AMYLASE in the last 168 hours. Recent Labs  Lab 03/22/18 2302  AMMONIA 20   Coagulation Profile: No results for input(s): INR, PROTIME in the last 168 hours. Cardiac Enzymes: No results for input(s): CKTOTAL, CKMB, CKMBINDEX, TROPONINI in the last 168 hours. BNP (last 3 results) Recent Labs    09/23/17 1139 01/12/18 1613  PROBNP 120.0* 123.0*   HbA1C: No results for input(s): HGBA1C in the last 72 hours. CBG: No results for input(s): GLUCAP in the last 168 hours. Lipid Profile: No results for input(s): CHOL, HDL, LDLCALC, TRIG, CHOLHDL, LDLDIRECT in the last 72 hours. Thyroid Function Tests: Recent Labs    03/22/18 2302  TSH 2.509   Anemia Panel: Recent Labs    03/22/18 2302  VITAMINB12 495   Sepsis Labs: No results for input(s): PROCALCITON, LATICACIDVEN in the last 168 hours.  No results found for this or any previous visit (from the past 240 hour(s)).       Radiology Studies: Dg Chest Port 1 View  Result Date: 03/24/2018 CLINICAL DATA:  Aspiration into airway EXAM: PORTABLE CHEST 1 VIEW COMPARISON:  03/22/2018 FINDINGS: Shallow lung inflation. There are patchy opacities at the lung bases bilaterally, RIGHT  greater than LEFT and consistent with atelectasis or infiltrates. Heart size is accentuated by technique. There is tortuosity of the thoracic aorta. No pulmonary edema. IMPRESSION: Bibasilar atelectasis or infiltrates, RIGHT greater than LEFT. Electronically Signed   By: Nolon Nations M.D.   On: 03/24/2018 20:48        Scheduled Meds: . [START ON 03/26/2018] amoxicillin-clavulanate  875 mg Oral Q12H  . aspirin  325 mg Oral Daily  . budesonide (PULMICORT) nebulizer solution  0.25 mg Nebulization BID  . calcium-vitamin D  1 tablet Oral Daily  . docusate sodium  100 mg Oral BID  . enoxaparin (LOVENOX) injection  40 mg Subcutaneous QHS  . levothyroxine  100 mcg Oral QAC breakfast  . multivitamin with minerals  1 tablet Oral Daily  . pantoprazole  40 mg Oral Daily  . umeclidinium-vilanterol  1 puff Inhalation Daily  . verapamil  80 mg Oral TID   Continuous Infusions: . sodium  chloride Stopped (03/25/18 1737)  . piperacillin-tazobactam (ZOSYN)  IV 12.5 mL/hr at 03/25/18 1800     LOS: 2 days    Time spent: 28 minutes    Hosie Poisson, MD [facility] Pager [phone]  If 7PM-7AM, please contact night-coverage [email or web address] Password [email or web address] 03/25/2018, 7:19 PM

## 2018-03-25 NOTE — Progress Notes (Signed)
SLP has been informed that pt continues to demonstrate indications of aspiration with po.  Given chronicity of dysphagia,aspiration, recommend palliative consult.   Luanna Salk, [facility] SLP Acute Rehab Services Pager [phone] Office [phone]

## 2018-03-26 MED ORDER — FREE WATER
200.0000 mL | Freq: Four times a day (QID) | Status: DC
  Administered 2018-03-26 – 2018-03-29 (×12): 200 mL

## 2018-03-26 MED ORDER — OLANZAPINE 5 MG PO TABS
2.5000 mg | ORAL_TABLET | Freq: Every evening | ORAL | Status: DC | PRN
  Administered 2018-03-26: 2.5 mg via ORAL
  Filled 2018-03-26: qty 1

## 2018-03-26 NOTE — Progress Notes (Signed)
PROGRESS NOTE    [name] Thivierge Sr.  YKD:[id number] DOB: 1924/11/18 DOA: 03/22/2018 PCP: Renaldo Reel, DO    Brief Narrative:  [name] Vonada Sr. is a 84 y.o. male with medical history significant for COPD with chronic hypoxic respiratory failure, hypothyroidism, hypertension, and chronic mild confusion and memory loss, now presenting to the emergency department for evaluation of increased confusion, agitation, and hallucinations.   Patient's son at bedside reports that he has been having increased confusion with agitation and hallucinations since Saturday and has not slept since then.  Patient also had about 3 weeks of physical therapy at [facility] and was recently discharged home.  Psychiatric consulted for increased agitation, hallucinations and insomnia. Recommended to use zyprexa prn.   Assessment & Plan:   Principal Problem:   Altered mental status Active Problems:   COPD (chronic obstructive pulmonary disease) with emphysema (HCC)   Chronic respiratory failure (HCC)   HTN (hypertension)   Acute encephalopathy   Hypothyroidism   Acute encephalopathy Unclear etiology differential includes infection versus dehydration versus delirium.  Initial evaluation with CT head, negative for acute findings.  Patient is able to move all his extremities.  ABG does not show any hypercarbia.  There are no signs of infection so far.  His urine analysis is negative his chest x-ray is negative for pneumonia or acute changes. TSH within normal limits, B12 within normal limits, ammonia level is within normal limits.  Recommend getting SLP eval AND started diet .  Psychiatric consulted for increased agitation and hallucinations, recommended starting zyprexa if EKG is wnl.. Physical therapy evaluation for deconditioning. Pt  had a coughing spell and possible aspiration event  On 10/10 and CXR on 10/11 shows right infiltrate , possibly aspiration pneumonia.  PCCM on board, and started him on  IV zosyn.  Transition to oral Augmentin to complete the course on discharge.   Dehydration: improving. Recommend getting FREE WATER 200 ML every 6 hours. Discussed with RN.   Insomnia:  Appears to have resolved.  Prn zyprexa ordered by psychiatry for agitation, delirium and insomnia.   Hypertension Better controlled. .   Hypothyroidism TSH within normal limits, resume home dose of Synthroid.    COPD, chronic bronchitis No wheezing heard on exam Albuterol as needed and azithromycin for bronchitis.   Family reports patient did not sleep last night, and was coughing with dysphagia 3 diet. , he was agitated and trying to go home as per the family.  They wanted him to stay in the hospital for another day and see if he will sleep better and can be discharged in am. Meanwhile recommend getting a repeat SLP evaluation.    DVT prophylaxis: Lovenox Code Status: Full code Family Communication: discussed with daughter and son at bedside.  Disposition Plan: home possibly in am.    Consultants:   Psychiatric  PCCM   Procedures: None  Antimicrobials: Azithromycin for bronchitis since admission.  Subjective: Alert and answering questions appropriately. No chest pain or sob. No nausea, or vomiting.   Objective: Vitals:   03/26/18 0555 03/26/18 0936 03/26/18 0940 03/26/18 1323  BP: (!) 178/92   (!) 170/85  Pulse: 75   69  Resp: 19   16  Temp: 97.7 F (36.5 C)   97.9 F (36.6 C)  TempSrc: Oral   Oral  SpO2: 97% 97% 97% 97%  Weight:      Height:        Intake/Output Summary (Last 24 hours) at 03/26/2018 1606 Last data filed  at 03/26/2018 0900 Gross per 24 hour  Intake 223.48 ml  Output 1250 ml  Net -1026.52 ml   Filed Weights   03/22/18 2251  Weight: 60.8 kg    Examination:  General exam: Appears calm and comfortable not in distress,  Eating breakfast.  Respiratory system:  Diminished at bases,  Coarse breath sounds, no wheezing or rhonchi.  Cardiovascular  system: S1 & S2 heard, RRR.  No pedal edema. Gastrointestinal system: Abdomen is soft NT nd bs+ PEG in place.  Central nervous system: alert and answering questions appropriately  Extremities: able to move his extremities. Working with PT.  Skin: No rashes, lesions or ulcers Psychiatry: calm.     Data Reviewed: I have personally reviewed following labs and imaging studies  CBC: Recent Labs  Lab 03/22/18 1625 03/22/18 2302 03/25/18 0347  WBC 8.3  --  7.0  NEUTROABS 5.8  --   --   HGB 12.0*  --  9.9*  HCT 36.5* 38.5 31.3*  MCV 95.3  --  97.5  PLT 156  --  751*   Basic Metabolic Panel: Recent Labs  Lab 03/22/18 1625 03/23/18 0344 03/24/18 0907 03/25/18 0347  NA 143 142 144 143  K 4.3 4.2 3.9 4.1  CL 106 107 112* 113*  CO2 28 25 24 23   GLUCOSE 91 104* 67* 96  BUN 24* 25* 30* 32*  CREATININE 0.88 0.81 0.83 0.95  CALCIUM 9.3 9.0 8.4* 8.3*  MG  --   --  2.4  --    GFR: Estimated Creatinine Clearance: 44.4 mL/min (by C-G formula based on SCr of 0.95 mg/dL). Liver Function Tests: Recent Labs  Lab 03/22/18 1625  AST 27  ALT 23  ALKPHOS 78  BILITOT 0.7  PROT 7.4  ALBUMIN 3.5   No results for input(s): LIPASE, AMYLASE in the last 168 hours. Recent Labs  Lab 03/22/18 2302  AMMONIA 20   Coagulation Profile: No results for input(s): INR, PROTIME in the last 168 hours. Cardiac Enzymes: No results for input(s): CKTOTAL, CKMB, CKMBINDEX, TROPONINI in the last 168 hours. BNP (last 3 results) Recent Labs    09/23/17 1139 01/12/18 1613  PROBNP 120.0* 123.0*   HbA1C: No results for input(s): HGBA1C in the last 72 hours. CBG: No results for input(s): GLUCAP in the last 168 hours. Lipid Profile: No results for input(s): CHOL, HDL, LDLCALC, TRIG, CHOLHDL, LDLDIRECT in the last 72 hours. Thyroid Function Tests: No results for input(s): TSH, T4TOTAL, FREET4, T3FREE, THYROIDAB in the last 72 hours. Anemia Panel: No results for input(s): VITAMINB12, FOLATE,  FERRITIN, TIBC, IRON, RETICCTPCT in the last 72 hours. Sepsis Labs: No results for input(s): PROCALCITON, LATICACIDVEN in the last 168 hours.  No results found for this or any previous visit (from the past 240 hour(s)).       Radiology Studies: Dg Chest Port 1 View  Result Date: 03/24/2018 CLINICAL DATA:  Aspiration into airway EXAM: PORTABLE CHEST 1 VIEW COMPARISON:  03/22/2018 FINDINGS: Shallow lung inflation. There are patchy opacities at the lung bases bilaterally, RIGHT greater than LEFT and consistent with atelectasis or infiltrates. Heart size is accentuated by technique. There is tortuosity of the thoracic aorta. No pulmonary edema. IMPRESSION: Bibasilar atelectasis or infiltrates, RIGHT greater than LEFT. Electronically Signed   By: Nolon Nations M.D.   On: 03/24/2018 20:48        Scheduled Meds: . amoxicillin-clavulanate  875 mg Oral Q12H  . aspirin  325 mg Oral Daily  . budesonide (PULMICORT) nebulizer  solution  0.25 mg Nebulization BID  . calcium-vitamin D  1 tablet Oral Daily  . docusate sodium  100 mg Oral BID  . enoxaparin (LOVENOX) injection  40 mg Subcutaneous QHS  . free water  200 mL Per Tube Q6H  . levothyroxine  100 mcg Oral QAC breakfast  . multivitamin with minerals  1 tablet Oral Daily  . pantoprazole  40 mg Oral Daily  . umeclidinium-vilanterol  1 puff Inhalation Daily  . verapamil  80 mg Oral TID   Continuous Infusions: . sodium chloride 10 mL/hr at 03/26/18 0621     LOS: 3 days    Time spent: 28 minutes    Hosie Poisson, MD [facility] Pager [phone]  If 7PM-7AM, please contact night-coverage [email or web address] Password [email or web address] 03/26/2018, 4:06 PM

## 2018-03-27 MED ORDER — PIPERACILLIN-TAZOBACTAM 3.375 G IVPB
3.3750 g | Freq: Three times a day (TID) | INTRAVENOUS | Status: DC
  Administered 2018-03-27 – 2018-03-28 (×3): 3.375 g via INTRAVENOUS
  Filled 2018-03-27 (×5): qty 50

## 2018-03-27 MED ORDER — DOCUSATE SODIUM 50 MG/5ML PO LIQD
100.0000 mg | Freq: Two times a day (BID) | ORAL | Status: DC
  Administered 2018-03-27 – 2018-04-02 (×12): 100 mg
  Filled 2018-03-27 (×15): qty 10

## 2018-03-27 MED ORDER — PANTOPRAZOLE SODIUM 40 MG PO PACK
40.0000 mg | PACK | Freq: Every day | ORAL | Status: DC
  Administered 2018-03-27 – 2018-04-02 (×7): 40 mg
  Filled 2018-03-27 (×8): qty 20

## 2018-03-27 MED ORDER — TRAZODONE HCL 50 MG PO TABS
50.0000 mg | ORAL_TABLET | Freq: Once | ORAL | Status: AC
  Administered 2018-03-27: 50 mg via ORAL
  Filled 2018-03-27: qty 1

## 2018-03-27 MED ORDER — POLYETHYLENE GLYCOL 3350 17 G PO PACK
17.0000 g | PACK | Freq: Every day | ORAL | Status: DC
  Administered 2018-03-27 – 2018-04-02 (×7): 17 g
  Filled 2018-03-27 (×7): qty 1

## 2018-03-27 NOTE — Progress Notes (Signed)
Pharmacy Antibiotic Note  [name] Spradley Sr. is a 84 y.o. male admitted on 03/22/2018 with aspiration PNA. Patient treated with 3 days of Zosyn and transitioned to Augmentin yesterday. Patient subsequently aspirated again and Pharmacy has been consulted to resume Zosyn dosing.  Plan:  Zosyn 3.375 g IV given once over 30 minutes, then every 8 hrs by 4-hr infusion  Please note that neither CAP nor HAP/VAP guidelines now routinely recommend empiric anaerobic coverage for aspiration events  Additionally, if patient is aspirating d/t continued feeding by family despite known aspiration risk, repeated treatment with antibiotics is no longer appropriate; need to clarify patient/family wishes and goals of care before retreating  Height: 5\' 6"  (167.6 cm) Weight: 134 lb 0.6 oz (60.8 kg) IBW/kg (Calculated) : 63.8  Temp (24hrs), Avg:98.1 F (36.7 C), Min:98 F (36.7 C), Max:98.2 F (36.8 C)  Recent Labs  Lab 03/22/18 1625 03/23/18 0344 03/24/18 0907 03/25/18 0347  WBC 8.3  --   --  7.0  CREATININE 0.88 0.81 0.83 0.95    Estimated Creatinine Clearance: 44.4 mL/min (by C-G formula based on SCr of 0.95 mg/dL).    Allergies  Allergen Reactions  . Codeine Nausea And Vomiting     Thank you for allowing pharmacy to be a part of this patient's care.  Reuel Boom, PharmD, BCPS [phone] 03/27/2018, 1:36 PM

## 2018-03-27 NOTE — Progress Notes (Signed)
PROGRESS NOTE    [name] Quiroa Sr.  HBZ:[id number] DOB: 1924/11/08 DOA: 03/22/2018 PCP: Renaldo Reel, DO    Brief Narrative:  [name] Jech Sr. is a 84 y.o. male with medical history significant for COPD with chronic hypoxic respiratory failure, hypothyroidism, hypertension, and chronic mild confusion and memory loss, now presenting to the emergency department for evaluation of increased confusion, agitation, and hallucinations.   Patient's son at bedside reports that he has been having increased confusion with agitation and hallucinations since Saturday and has not slept since then.  Patient also had about 3 weeks of physical therapy at [facility] and was recently discharged home.  Psychiatric consulted for increased agitation, hallucinations and insomnia. Recommended to use zyprexa prn.   Assessment & Plan:   Principal Problem:   Altered mental status Active Problems:   COPD (chronic obstructive pulmonary disease) with emphysema (HCC)   Chronic respiratory failure (HCC)   HTN (hypertension)   Acute encephalopathy   Hypothyroidism   Acute encephalopathy Unclear etiology differential includes infection versus dehydration versus delirium.  Initial evaluation with CT head, negative for acute findings.  Patient is able to move all his extremities.  ABG does not show any hypercarbia.  There are no signs of infection so far.  His urine analysis is negative his chest x-ray is negative for pneumonia or acute changes. TSH within normal limits, B12 within normal limits, ammonia level is within normal limits.  Recommend getting SLP eval AND started diet .  Psychiatric consulted for increased agitation and hallucinations, recommended starting zyprexa if EKG is wnl.. Physical therapy evaluation for deconditioning. Patient continues to aspirate with eating dysphagia 3 diet,despite restrictions, and family upset about it.  Pt was slightly agitated last night , but had calmed down after  given zyprexa. But family reports he did not sleep much last night and wanted me to call neurology evaluation .  Repeat SLP evaluation ordered.  Ordered MRI brain without contrast to see if he had stroke . Meanwhile recommended to use  Tube feeds via PEG tube to minimize the aspiration risk.    We have restarted zosyn for now as had multiple episodes of coughing while he was eating in the last 48 hours, he will be discharged on oral augmentin when ready to go home.   Dietary consulted for restarting the tube feeds.      Dehydration: improving. Free water via PEG tube ,  Discussed with RN.   Insomnia:  Persistent.  Prn zyprexa ordered by psychiatry for agitation, delirium and insomnia.   Hypertension Better controlled. .   Hypothyroidism TSH within normal limits, resume home dose of Synthroid.    COPD, chronic bronchitis No wheezing heard on exam Albuterol as needed and azithromycin for bronchitis.     DVT prophylaxis: Lovenox Code Status: Full code Family Communication: discussed with daughter and son at bedside.  Disposition Plan: home possibly in am.    Consultants:   Psychiatric  PCCM   Procedures: None  Antimicrobials: Azithromycin for bronchitis since admission.  Subjective: Sleepy this am, but not agitated. No specific complaints.  Daughter at bedside, reported pt calmed down after zyprexa but did not sleep all night.    Objective: Vitals:   03/27/18 0603 03/27/18 0832 03/27/18 0835 03/27/18 1333  BP: (!) 160/86   129/84  Pulse: 80   74  Resp: 18   20  Temp: 98.2 F (36.8 C)   (!) 97.3 F (36.3 C)  TempSrc: Oral   Oral  SpO2:  91% 90% 90% 95%  Weight:      Height:        Intake/Output Summary (Last 24 hours) at 03/27/2018 1713 Last data filed at 03/27/2018 0559 Gross per 24 hour  Intake 0 ml  Output 1200 ml  Net -1200 ml   Filed Weights   03/22/18 2251  Weight: 60.8 kg    Examination:  General exam: sleepy , not agitated.    Respiratory system:   Air entry fair , no wheezing or rhonchi.  Cardiovascular system: S1 & S2 heard, RRR.  No pedal edema. Gastrointestinal system: Abdomen is soft non tender non distended. PEG in place.  Central nervous system: calm. Non focal able to move all extremities.  Extremities: no pedal edema.  Skin: No rashes, lesions or ulcers Psychiatry: calm.     Data Reviewed: I have personally reviewed following labs and imaging studies  CBC: Recent Labs  Lab 03/22/18 1625 03/22/18 2302 03/25/18 0347  WBC 8.3  --  7.0  NEUTROABS 5.8  --   --   HGB 12.0*  --  9.9*  HCT 36.5* 38.5 31.3*  MCV 95.3  --  97.5  PLT 156  --  616*   Basic Metabolic Panel: Recent Labs  Lab 03/22/18 1625 03/23/18 0344 03/24/18 0907 03/25/18 0347  NA 143 142 144 143  K 4.3 4.2 3.9 4.1  CL 106 107 112* 113*  CO2 28 25 24 23   GLUCOSE 91 104* 67* 96  BUN 24* 25* 30* 32*  CREATININE 0.88 0.81 0.83 0.95  CALCIUM 9.3 9.0 8.4* 8.3*  MG  --   --  2.4  --    GFR: Estimated Creatinine Clearance: 44.4 mL/min (by C-G formula based on SCr of 0.95 mg/dL). Liver Function Tests: Recent Labs  Lab 03/22/18 1625  AST 27  ALT 23  ALKPHOS 78  BILITOT 0.7  PROT 7.4  ALBUMIN 3.5   No results for input(s): LIPASE, AMYLASE in the last 168 hours. Recent Labs  Lab 03/22/18 2302  AMMONIA 20   Coagulation Profile: No results for input(s): INR, PROTIME in the last 168 hours. Cardiac Enzymes: No results for input(s): CKTOTAL, CKMB, CKMBINDEX, TROPONINI in the last 168 hours. BNP (last 3 results) Recent Labs    09/23/17 1139 01/12/18 1613  PROBNP 120.0* 123.0*   HbA1C: No results for input(s): HGBA1C in the last 72 hours. CBG: No results for input(s): GLUCAP in the last 168 hours. Lipid Profile: No results for input(s): CHOL, HDL, LDLCALC, TRIG, CHOLHDL, LDLDIRECT in the last 72 hours. Thyroid Function Tests: No results for input(s): TSH, T4TOTAL, FREET4, T3FREE, THYROIDAB in the last 72  hours. Anemia Panel: No results for input(s): VITAMINB12, FOLATE, FERRITIN, TIBC, IRON, RETICCTPCT in the last 72 hours. Sepsis Labs: No results for input(s): PROCALCITON, LATICACIDVEN in the last 168 hours.  No results found for this or any previous visit (from the past 240 hour(s)).       Radiology Studies: No results found.      Scheduled Meds: . aspirin  325 mg Oral Daily  . budesonide (PULMICORT) nebulizer solution  0.25 mg Nebulization BID  . calcium-vitamin D  1 tablet Oral Daily  . docusate  100 mg Per Tube BID  . enoxaparin (LOVENOX) injection  40 mg Subcutaneous QHS  . free water  200 mL Per Tube Q6H  . levothyroxine  100 mcg Oral QAC breakfast  . multivitamin with minerals  1 tablet Oral Daily  . pantoprazole sodium  40 mg  Per Tube Daily  . polyethylene glycol  17 g Per Tube Daily  . umeclidinium-vilanterol  1 puff Inhalation Daily  . verapamil  80 mg Oral TID   Continuous Infusions: . sodium chloride Stopped (03/26/18 0751)  . piperacillin-tazobactam (ZOSYN)  IV 3.375 g (03/27/18 1609)     LOS: 4 days    Time spent: 25 minutes    Hosie Poisson, MD [facility] Pager [phone]  If 7PM-7AM, please contact night-coverage [email or web address] Password [email or web address] 03/27/2018, 5:13 PM

## 2018-03-28 ENCOUNTER — Inpatient Hospital Stay (HOSPITAL_COMMUNITY): Payer: Medicare HMO

## 2018-03-28 ENCOUNTER — Ambulatory Visit: Payer: Medicare HMO | Admitting: Pulmonary Disease

## 2018-03-28 DIAGNOSIS — R4182 Altered mental status, unspecified: Secondary | ICD-10-CM

## 2018-03-28 MED ORDER — ADULT MULTIVITAMIN W/MINERALS CH
1.0000 | ORAL_TABLET | Freq: Every day | ORAL | Status: DC
  Administered 2018-03-29 – 2018-04-02 (×5): 1
  Filled 2018-03-28 (×5): qty 1

## 2018-03-28 MED ORDER — CALCIUM CARBONATE-VITAMIN D 500-200 MG-UNIT PO TABS
1.0000 | ORAL_TABLET | Freq: Every day | ORAL | Status: DC
  Administered 2018-03-29 – 2018-04-02 (×5): 1
  Filled 2018-03-28 (×5): qty 1

## 2018-03-28 MED ORDER — VERAPAMIL HCL 80 MG PO TABS
80.0000 mg | ORAL_TABLET | Freq: Three times a day (TID) | ORAL | Status: DC
  Administered 2018-03-28 – 2018-04-02 (×15): 80 mg
  Filled 2018-03-28 (×18): qty 1

## 2018-03-28 MED ORDER — OLANZAPINE 5 MG PO TABS: 2.5000 mg | ORAL_TABLET | Freq: Every evening | ORAL | Status: DC | PRN

## 2018-03-28 MED ORDER — ASPIRIN 325 MG PO TABS
325.0000 mg | ORAL_TABLET | Freq: Every day | ORAL | Status: DC
  Administered 2018-03-29 – 2018-04-02 (×5): 325 mg
  Filled 2018-03-28 (×5): qty 1

## 2018-03-28 MED ORDER — TRAZODONE HCL 50 MG PO TABS
50.0000 mg | ORAL_TABLET | Freq: Every day | ORAL | Status: DC
  Administered 2018-03-28 – 2018-03-30 (×3): 50 mg via ORAL
  Filled 2018-03-28 (×3): qty 1

## 2018-03-28 MED ORDER — ENSURE ENLIVE PO LIQD
237.0000 mL | Freq: Three times a day (TID) | ORAL | Status: DC
  Administered 2018-03-28 (×2): 237 mL via ORAL

## 2018-03-28 MED ORDER — LEVOTHYROXINE SODIUM 100 MCG PO TABS
100.0000 ug | ORAL_TABLET | Freq: Every day | ORAL | Status: DC
  Administered 2018-03-29 – 2018-04-02 (×5): 100 ug
  Filled 2018-03-28 (×5): qty 1

## 2018-03-28 MED ORDER — JEVITY 1.2 CAL PO LIQD
1000.0000 mL | ORAL | Status: DC
  Administered 2018-03-28: 1000 mL

## 2018-03-28 NOTE — Progress Notes (Addendum)
  Speech Language Pathology Treatment: Dysphagia  Patient Details Name: [name] [name]. MRN: [id number] DOB: [birth year] Today's Date: 03/28/2018 Time: 1287-8676 SLP Time Calculation (min) (ACUTE ONLY): 27 min  Assessment / Plan / Recommendation Clinical Impression  SLP visit to educate son Eduard Clos to pt with ongoing aspiration in the hospital and concern for recurrent aspiration pneumonias.  He admits pt was coughing with solids this weekend causing him to stop eating.  Extensive education regarding functional reserve compromise with fall - hip fx, pna within the last month and current pulmonary issue/deconditioning.  Reviewed multiple risk factors for aspiration pneumonias and not just dysphagia.  Inquired re: son's willingness to see palliative to help establish goals to which he stated approval.  Son stated "whatever will help my dad".   Son also states his father was talking about things from his past over the weekend as well.    Pt not alert enough to provide po during SLP session - after SLP verbal/tactile stimulation - he did not awaken adequately.  Congested coughing noted x2 - concerning for secretion aspiration.     HPI HPI: pt is a 84 yo male adm to [facility] with AMS - pt had not slept for several days prior to admit per family.  Pt with PMH + for hip fx s/p surgery with post-op dysphagia requiring feeding tube, COPD, aspiration pna.   Patient has had several swallow studies completed in the past with last one recommending dys3/nectar diet.  He recently had a "touch" of pna and was treated with ABX per daughter.      SLP Plan  (await until pt fully alert)       Recommendations  Diet recommendations: Other(comment)(single small ice chips after oral care to help manage secretions) Medication Administration: Via alternative means Compensations: Slow rate;Small sips/bites       Palliative consult         Oral Care Recommendations: Oral care BID SLP Visit Diagnosis: Dysphagia,  pharyngeal phase (R13.13) Plan: (await until pt fully alert)       GO               Luanna Salk, MS [facility] SLP Acute Rehab Services Pager [phone] Office [phone]  Macario Golds 03/28/2018, 12:03 PM

## 2018-03-28 NOTE — Progress Notes (Signed)
Occupational Therapy Treatment Patient Details Name: [name] Loren Sr. MRN: [id number] DOB: August 10, 1924 Today's Date: 03/28/2018    History of present illness 84 y.o. male with medical history significant for COPD , hypothyroidism, hypertension, and chronic mild confusion and memory loss, R hip fracture/ Hemiarthroplasty in July/19, presenting to the ED 03/22/18 for evaluation of increased confusion, agitation, and hallucinations.     OT comments  Pt in chair and willing to peform activity with OT  Follow Up Recommendations  Home health OT;Supervision/Assistance - 24 hour    Equipment Recommendations  None recommended by OT    Recommendations for Other Services      Precautions / Restrictions Precautions Precautions: Posterior Hip Precaution Comments: right hip hemiarthroplasty 11/2017 posterior, Has a PEG form hospital stay in july Restrictions Weight Bearing Restrictions: No       Mobility Bed Mobility Overal bed mobility: Needs Assistance Bed Mobility: Supine to Sit     Supine to sit: Min assist;HOB elevated     General bed mobility comments: pt in chair  Transfers Overall transfer level: Needs assistance Equipment used: Rolling walker (2 wheeled) Transfers: Sit to/from Stand Sit to Stand: Min assist         General transfer comment: Assist to steady. VCS safety, hand placement    Balance Overall balance assessment: Needs assistance         Standing balance support: Bilateral upper extremity supported Standing balance-Leahy Scale: Poor                             ADL either performed or assessed with clinical judgement   ADL Overall ADL's : Needs assistance/impaired                                       General ADL Comments: Pt practiced sit to stand 3 times with OT focusing on hand placement in prep for ADL activity .  Pts family speaking with MD regarding concerns./     Vision Patient Visual Report: No change from  baseline            Cognition Arousal/Alertness: Awake/alert Behavior During Therapy: WFL for tasks assessed/performed Overall Cognitive Status: History of cognitive impairments - at baseline                                                     Pertinent Vitals/ Pain       Pain Assessment: No/denies pain   Progress Toward Goals  OT Goals(current goals can now be found in the care plan section)        Plan Discharge plan remains appropriate    Co-evaluation                 AM-PAC PT "6 Clicks" Daily Activity     Outcome Measure   Help from another person eating meals?: A Little Help from another person taking care of personal grooming?: A Little Help from another person toileting, which includes using toliet, bedpan, or urinal?: A Lot Help from another person bathing (including washing, rinsing, drying)?: A Lot Help from another person to put on and taking off regular upper body clothing?: A Little Help from another person to put on and taking  off regular lower body clothing?: A Lot 6 Click Score: 15    End of Session    OT Visit Diagnosis: Unsteadiness on feet (R26.81);Muscle weakness (generalized) (M62.81)   Activity Tolerance Patient tolerated treatment well   Patient Left in bed;with call bell/phone within reach;with bed alarm set;with family/visitor present   Nurse Communication          Time: 1545-1600 OT Time Calculation (min): 15 min  Charges: OT General Charges $OT Visit: 1 Visit OT Treatments $Self Care/Home Management : 8-22 mins  Kari Baars, [place] [phone] Office- [phone]      [place], Edwena Felty D 03/28/2018, 7:03 PM

## 2018-03-28 NOTE — Progress Notes (Signed)
  Speech Language Pathology Treatment: Dysphagia  Patient Details Name: [name] Somers Sr. MRN: [id number] DOB: 28-Aug-1924 Today's Date: 03/28/2018 Time: 9030-0923 SLP Time Calculation (min) (ACUTE ONLY): 19 min  Assessment / Plan / Recommendation Clinical Impression  SLP notified by son that pt now more alert and pt desiring po intake.  Provided oral care and single small ice chips to pt.  Delayed swallow noted followed by congested cough (productive x3/5 coughing episodes).   To help this patient's  family elucidate dysphagia and make educated decisions regarding his care - will proceed with MBS today at approximately 1300.  Son Eduard Clos in agreement, RN and MD informed.  Again regardless would recommend palliative consult to help establish goals of care.  Recommend npo x ice chips as tolerated pending MBS.  Spoke to dietician who reported pt was getting "bolus feeds" at home also concerning for aspiration pna risk.       HPI HPI: pt is a 84 yo male adm to [facility] with AMS - pt had not slept for several days prior to admit per family.  Pt with PMH + for hip fx s/p surgery with post-op dysphagia requiring feeding tube, COPD, aspiration pna.   Patient has had several swallow studies completed in the past with last one recommending dys3/nectar diet.  He recently had a "touch" of pna and was treated with ABX per daughter.      SLP Plan  MBS       Recommendations  Diet recommendations: Other(comment)(single small ice chips) Medication Administration: Via alternative means Compensations: Slow rate;Small sips/bites                Oral Care Recommendations: Oral care BID SLP Visit Diagnosis: Dysphagia, pharyngeal phase (R13.13) Plan: MBS       GO                Macario Golds 03/28/2018, 12:15 PM  Luanna Salk, [facility] SLP Acute Rehab Services Pager [phone] Office [phone]

## 2018-03-28 NOTE — Progress Notes (Signed)
Physical Therapy Treatment Patient Details Name: [name] Laskowski Sr. MRN: [id number] DOB: 05-31-1925 Today's Date: 03/28/2018    History of Present Illness 84 y.o. male with medical history significant for COPD , hypothyroidism, hypertension, and chronic mild confusion and memory loss, R hip fracture/ Hemiarthroplasty in July/19, presenting to the ED 03/22/18 for evaluation of increased confusion, agitation, and hallucinations.      PT Comments    Pt agreeable to work with PT on today with family encouragement. O2 sat >90% on RA during session. Left O2 off-made RN aware. Will continue to follow and progress activity as tolerated.    Follow Up Recommendations  Home health PT;Supervision/Assistance - 24 hour     Equipment Recommendations  None recommended by PT    Recommendations for Other Services       Precautions / Restrictions Precautions Precautions: Posterior Hip Precaution Comments: right hip hemiarthroplasty 11/2017 posterior, Has a PEG form hospital stay in july Restrictions Weight Bearing Restrictions: No    Mobility  Bed Mobility Overal bed mobility: Needs Assistance Bed Mobility: Supine to Sit     Supine to sit: Min assist;HOB elevated     General bed mobility comments: small amount of assist for trunk. Increased time.   Transfers Overall transfer level: Needs assistance Equipment used: Rolling walker (2 wheeled) Transfers: Sit to/from Stand Sit to Stand: Min assist         General transfer comment: Assist to steady. VCS safety, hand placement  Ambulation/Gait Ambulation/Gait assistance: Min assist Gait Distance (Feet): 85 Feet Assistive device: Rolling walker (2 wheeled) Gait Pattern/deviations: Step-through pattern;Decreased stride length;Trunk flexed;Narrow base of support     General Gait Details: Assist to steady pt and maneuver with RW intermittently.  O2 >90%on RA, dyspnea 2/4. Pt tolerated distance well.    Stairs              Wheelchair Mobility    Modified Rankin (Stroke Patients Only)       Balance Overall balance assessment: Needs assistance         Standing balance support: Bilateral upper extremity supported Standing balance-Leahy Scale: Poor                              Cognition Arousal/Alertness: Awake/alert Behavior During Therapy: WFL for tasks assessed/performed Overall Cognitive Status: History of cognitive impairments - at baseline                                        Exercises      General Comments        Pertinent Vitals/Pain Pain Assessment: No/denies pain    Home Living                      Prior Function            PT Goals (current goals can now be found in the care plan section) Progress towards PT goals: Progressing toward goals    Frequency    Min 3X/week      PT Plan Current plan remains appropriate    Co-evaluation              AM-PAC PT "6 Clicks" Daily Activity  Outcome Measure  Difficulty turning over in bed (including adjusting bedclothes, sheets and blankets)?: A Lot Difficulty moving from lying on back to sitting on the  side of the bed? : A Lot Difficulty sitting down on and standing up from a chair with arms (e.g., wheelchair, bedside commode, etc,.)?: Unable Help needed moving to and from a bed to chair (including a wheelchair)?: A Little Help needed walking in hospital room?: A Little Help needed climbing 3-5 steps with a railing? : A Lot 6 Click Score: 13    End of Session Equipment Utilized During Treatment: Gait belt Activity Tolerance: Patient tolerated treatment well Patient left: in chair;with call bell/phone within reach;with family/visitor present   PT Visit Diagnosis: Unsteadiness on feet (R26.81);Muscle weakness (generalized) (M62.81)     Time: 9842-1031 PT Time Calculation (min) (ACUTE ONLY): 20 min  Charges:  $Gait Training: 8-22 mins                        Weston Anna, PT Acute Rehabilitation Services Pager: [phone] Office: [phone]

## 2018-03-28 NOTE — Progress Notes (Signed)
Modified Barium Swallow Progress Note  Patient Details  Name: [name] [name]. MRN: [id number] Date of Birth: 03/22/1925  Today's Date: 03/28/2018  Modified Barium Swallow completed.  Full report located under Chart Review in the Imaging Section.  Brief recommendations include the following:  Clinical Impression  Pt presents with a moderate oral and severe pharyngeal dysphagia resulting in aspiration of thin, nectar, honey consistencies and gross residuals across all boluses.  Barium mixed with copious secretions retained in pharynx/larynx.  He is weak and has very poor laryngeal closure/epiglottic deflection/pharyngeal contraction results in gross vallecular residuals.  Aspiration occurs both during the swallow due to decreased airway closure and after as residuals spill into open airway.  Pt does NOT sense residuals period nor aspiration consistently.  Cued cough is weak and not effective to clear aspirates.  Throughout study pt was instructed to "cough" and "hock" to help clear pharynx.  This strategy decreased residuals but did not elminiate them.  Pt will aspirate regardless of consistency and thicker boluses result in worsening residuals.  He is aspirating secretions at this time. Son Eduard Clos present for exam and he, male attendant and pt were educated to findings/concerns.  Educated them again that tube feeding does NOT prevent aspiration - Again recommend palliative consult for this pt.  Recommend pt consume single small ice chips after oral care only to help with secretion management/comfort.  Pt should be encouraged to cough/hock with all intake.   If pt desires po - it should be with accepted aspiration and for comfort only as pt will be unable to meet nutritional needs by po alone due to level of dysphagia.     Swallow Evaluation Recommendations       SLP Diet Recommendations: NPO;Ice chips PRN after oral care- pending GOC - ? If pt/family will desire for pt to be comfort only -      Ice chips after oral care only  Single small ice chips, swallow and cough - oral suction                         Luanna Salk, MS [facility] SLP Acute Rehab Services Pager [phone] Office [phone]    Macario Golds 03/28/2018,2:27 PM

## 2018-03-28 NOTE — Care Management Important Message (Signed)
Important Message  Patient Details  Name: [name] Kilpatrick Sr. MRN: [id number] Date of Birth: 1924-08-27   Medicare Important Message Given:  Yes    Kerin Salen 03/28/2018, 11:06 AMImportant Message  Patient Details  Name: [name] Derousse Sr. MRN: [id number] Date of Birth: 10-10-1924   Medicare Important Message Given:  Yes    Kerin Salen 03/28/2018, 11:06 AM

## 2018-03-28 NOTE — Progress Notes (Signed)
NAME:  [name] [name]., MRN:  [id number], DOB:  Sep 17, 1924, LOS: 5 ADMISSION DATE:  03/22/2018, CONSULTATION DATE:  03/24/2018 REFERRING MD:  Karleen Hampshire, CHIEF COMPLAINT:  Confusion   Brief History   84 y/o male with a past medical history of recurrent aspiration pneumonia, COPD and CHF was admitted for further evaluation of agitated delirium   Past Medical History  Chronic kidney disease, Anemia, Hypothyroidism, COPD, Recurrent aspiration, Recent right hip fracture Significant Hospital Events   October 8 admission  Consults: date of consult/date signed off & final recs:  October 10 pulmonary  Procedures (surgical and bedside):  No procedures  Significant Diagnostic Tests:    Micro Data:    Antimicrobials:  10/10 Azithro> 10/13 10/10 Zosyn >  10/13  Subjective:  More awake   Objective   Blood pressure 128/74, pulse 74, temperature 97.9 F (36.6 C), temperature source Oral, resp. rate 16, height 5\' 6"  (1.676 m), weight 60.8 kg, SpO2 94 %.        Intake/Output Summary (Last 24 hours) at 03/28/2018 1352 Last data filed at 03/28/2018 0635 Gross per 24 hour  Intake 140.62 ml  Output 1450 ml  Net -1309.38 ml   Filed Weights   03/22/18 2251  Weight: 60.8 kg    Examination:  General 84 year old white male.  Does not appear to be in distress, following commands  hENT: Moist oral mucosa, no JVD Pulm: Rhonchi bilaterally Card: S1-S2 appreciated abd soft not tender + bowel sounds Ext: Dry, edema Gu: clear yellow  Neuro: awake but sleepy. Moves all extremities. No focal def.    Resolved Hospital Problem list   Agitation is improving  Assessment & Plan:  Acute agitated delirium complicated by hypoactive delirium/toxic encephalopathy possibly exacerbated by Zyprexa -Being managed with Zyprexa  Plan Cont supportive care  Dysphagia w/ aspiration event Plan Aspiration precautions   COPD with pseudomonas colonization: Plan Continue  bronchodilators Antibiotics on hold  Chronic diastolic heart failure: Plan Monitor for element of vol overload.   Disposition / Summary of Today's Plan 03/28/18   Read above Discontinue Zosyn Repeat chest x-ray in the morning    Diet: dysphagia 3, nectar thick DVT prophylaxis: lovenox Hyperglycemia protocol: per [facility] Mobility: PT consult Code Status: Full for now Family Communication: updated son/daughter bedside  Labs   CBC: Recent Labs  Lab 03/22/18 1625 03/22/18 2302 03/25/18 0347  WBC 8.3  --  7.0  NEUTROABS 5.8  --   --   HGB 12.0*  --  9.9*  HCT 36.5* 38.5 31.3*  MCV 95.3  --  97.5  PLT 156  --  145*    Basic Metabolic Panel: Recent Labs  Lab 03/22/18 1625 03/23/18 0344 03/24/18 0907 03/25/18 0347  NA 143 142 144 143  K 4.3 4.2 3.9 4.1  CL 106 107 112* 113*  CO2 28 25 24 23   GLUCOSE 91 104* 67* 96  BUN 24* 25* 30* 32*  CREATININE 0.88 0.81 0.83 0.95  CALCIUM 9.3 9.0 8.4* 8.3*  MG  --   --  2.4  --    GFR: Estimated Creatinine Clearance: 44.4 mL/min (by C-G formula based on SCr of 0.95 mg/dL). Recent Labs  Lab 03/22/18 1625 03/25/18 0347  WBC 8.3 7.0    Liver Function Tests: Recent Labs  Lab 03/22/18 1625  AST 27  ALT 23  ALKPHOS 78  BILITOT 0.7  PROT 7.4  ALBUMIN 3.5   No results for input(s): LIPASE, AMYLASE in the last 168 hours.  Recent Labs  Lab 03/22/18 2302  AMMONIA 20    ABG    Component Value Date/Time   PHART 7.438 03/22/2018 2013   PCO2ART 38.8 03/22/2018 2013   PO2ART 79.6 (L) 03/22/2018 2013   HCO3 25.8 03/22/2018 2013   O2SAT 95.4 03/22/2018 2013     Coagulation Profile: No results for input(s): INR, PROTIME in the last 168 hours.  Cardiac Enzymes: No results for input(s): CKTOTAL, CKMB, CKMBINDEX, TROPONINI in the last 168 hours.  HbA1C: No results found for: HGBA1C  CBG: No results for input(s): GLUCAP in the last 168 hours.  Ari Engelbrecht.  MD Cell: [phone]

## 2018-03-28 NOTE — Progress Notes (Signed)
Nutrition Note  RD consulted for nutritional assessment and TF initiation and management. Pt with PEG which was placed in June 2019 per chart review. Per last RD note in June, pt was receiving bolus feeds of 270 ml of Jevity 1.5 QID.   At time of visit on floor, pt was having a speech evaluation by SLP. Per SLP, pt to have MBS completed today and MRI is scheduled for tonight.  Will await results of MBS and family decisions prior to interventions. Would not recommend resuming bolus feeds given aspiration risk.  Clayton Bibles, MS, RD, [place] [facility] Pager: [phone] After Hours Pager: [phone]

## 2018-03-28 NOTE — Progress Notes (Signed)
PROGRESS NOTE    [name] Kawecki Sr.  PYK:[id number] DOB: August 11, 1924 DOA: 03/22/2018 PCP: Renaldo Reel, DO    Brief Narrative:  [name] Balistreri Sr. is a 84 y.o. male with medical history significant for COPD with chronic hypoxic respiratory failure, hypothyroidism, hypertension, and chronic mild confusion and memory loss, now presenting to the emergency department for evaluation of increased confusion, agitation, and hallucinations.   Patient's son at bedside reports that he has been having increased confusion with agitation and hallucinations since Saturday and has not slept since then.  Patient also had about 3 weeks of physical therapy at [facility] and was recently discharged home.  Psychiatric consulted for increased agitation, hallucinations and insomnia. Recommended to use zyprexa prn.   Assessment & Plan:   Principal Problem:   Altered mental status Active Problems:   COPD (chronic obstructive pulmonary disease) with emphysema (HCC)   Chronic respiratory failure (HCC)   HTN (hypertension)   Acute encephalopathy   Hypothyroidism   Acute encephalopathy Unclear etiology differential includes infection versus dehydration versus delirium.  Initial evaluation with CT head, negative for acute findings.  Patient is able to move all his extremities.  ABG does not show any hypercarbia.  There are no signs of infection so far.  His urine analysis is negative his chest x-ray is negative for pneumonia or acute changes. TSH within normal limits, B12 within normal limits, ammonia level is within normal limits.  Psychiatric consulted for increased agitation and hallucinations, recommended starting zyprexa if EKG is wnl.. Physical therapy evaluation for deconditioning recommending home health PT.  Patient continues to aspirate with eating dysphagia 3 diet,despite restrictions, and family upset about it.  .  Repeat SLP evaluation ordered.underwent MBS today was found to have severe  pharyngeal dysphagia and mod oral dysphagia. Pt is pocketing food and coughing while taking thickened liquids.  speech recommended NPO, with ice chips. Family wanted to feed him via PEG tube  understanding the risk of aspiration. He was started on TUBE feeds this evening after talking to the family.  Ordered MRI brain without contrast to see if he had stroke, to explain his worsening dysphagia .  If MRI is negative, plan for EEG and if EEG is also negative, will get neurology evaluation for his intermittent confusion and lethargy.   He received 5 days of antibiotics for aspiration pneumonia, PCCM discontinued the antibiotics and repeat CXR in am.   Dietary consulted for restarting the tube feeds.   They do not want palliative care consult at this time for goals of care.    Dehydration: improving. Free water via PEG tube ,  Discussed with RN.   Insomnia:  Persistent.  Prn zyprexa ordered by psychiatry for agitation, delirium and insomnia.   Hypertension Better controlled. .   Hypothyroidism TSH within normal limits, resume home dose of Synthroid.    COPD, chronic bronchitis No wheezing heard on exam Albuterol as needed .    DVT prophylaxis: Lovenox Code Status: Full code Family Communication: discussed with son and grand daughter at bedside.   Disposition Plan: home possibly tomorrow.    Consultants:   Psychiatric  PCCM   Procedures: None  Antimicrobials: none.   Subjective: Alert and able to answer questions    Objective: Vitals:   03/27/18 2004 03/27/18 2005 03/28/18 0415 03/28/18 1506  BP: 132/82  128/74 128/67  Pulse: 82  74 (!) 59  Resp: 18  16 18   Temp: 97.6 F (36.4 C)  97.9 F (36.6 C) 97.7  F (36.5 C)  TempSrc: Oral  Oral Oral  SpO2: 92% 91% 94% 90%  Weight:      Height:        Intake/Output Summary (Last 24 hours) at 03/28/2018 1815 Last data filed at 03/28/2018 1642 Gross per 24 hour  Intake 251.86 ml  Output 1451 ml  Net -1199.14 ml    Filed Weights   03/22/18 2251  Weight: 60.8 kg    Examination:  General exam: alert and answering questions Respiratory system:  No wheezing or rhonchi, air entry fair.  Cardiovascular system: S1 & S2 heard, RRR.  No pedal edema. Gastrointestinal system: Abdomen is soft NT ND BS+ PEG in place.  Central nervous system: calm. Non focal able to move all extremities.  Extremities: no pedal edema.  Skin: No rashes, lesions or ulcers Psychiatry: calm. Not agitated    Data Reviewed: I have personally reviewed following labs and imaging studies  CBC: Recent Labs  Lab 03/22/18 1625 03/22/18 2302 03/25/18 0347  WBC 8.3  --  7.0  NEUTROABS 5.8  --   --   HGB 12.0*  --  9.9*  HCT 36.5* 38.5 31.3*  MCV 95.3  --  97.5  PLT 156  --  093*   Basic Metabolic Panel: Recent Labs  Lab 03/22/18 1625 03/23/18 0344 03/24/18 0907 03/25/18 0347  NA 143 142 144 143  K 4.3 4.2 3.9 4.1  CL 106 107 112* 113*  CO2 28 25 24 23   GLUCOSE 91 104* 67* 96  BUN 24* 25* 30* 32*  CREATININE 0.88 0.81 0.83 0.95  CALCIUM 9.3 9.0 8.4* 8.3*  MG  --   --  2.4  --    GFR: Estimated Creatinine Clearance: 44.4 mL/min (by C-G formula based on SCr of 0.95 mg/dL). Liver Function Tests: Recent Labs  Lab 03/22/18 1625  AST 27  ALT 23  ALKPHOS 78  BILITOT 0.7  PROT 7.4  ALBUMIN 3.5   No results for input(s): LIPASE, AMYLASE in the last 168 hours. Recent Labs  Lab 03/22/18 2302  AMMONIA 20   Coagulation Profile: No results for input(s): INR, PROTIME in the last 168 hours. Cardiac Enzymes: No results for input(s): CKTOTAL, CKMB, CKMBINDEX, TROPONINI in the last 168 hours. BNP (last 3 results) Recent Labs    09/23/17 1139 01/12/18 1613  PROBNP 120.0* 123.0*   HbA1C: No results for input(s): HGBA1C in the last 72 hours. CBG: No results for input(s): GLUCAP in the last 168 hours. Lipid Profile: No results for input(s): CHOL, HDL, LDLCALC, TRIG, CHOLHDL, LDLDIRECT in the last 72  hours. Thyroid Function Tests: No results for input(s): TSH, T4TOTAL, FREET4, T3FREE, THYROIDAB in the last 72 hours. Anemia Panel: No results for input(s): VITAMINB12, FOLATE, FERRITIN, TIBC, IRON, RETICCTPCT in the last 72 hours. Sepsis Labs: No results for input(s): PROCALCITON, LATICACIDVEN in the last 168 hours.  No results found for this or any previous visit (from the past 240 hour(s)).       Radiology Studies: Dg Swallowing Func-speech Pathology  Result Date: 03/28/2018 Objective Swallowing Evaluation: Type of Study: MBS-Modified Barium Swallow Study  Patient Details Name: [name] Wissner Sr. MRN: [id number] Date of Birth: 1925/05/26 Today's Date: 03/28/2018 Time: SLP Start Time (ACUTE ONLY): 1315 -SLP Stop Time (ACUTE ONLY): 1357 SLP Time Calculation (min) (ACUTE ONLY): 42 min Past Medical History: Past Medical History: Diagnosis Date . Anemia  . COPD (chronic obstructive pulmonary disease) (HCC)   Mild . HTN (hypertension)  . Hypothyroidism  Past Surgical  History: Past Surgical History: Procedure Laterality Date . CATARACT EXTRACTION Bilateral  . GALLBLADDER SURGERY  02/21/2011 . OTHER SURGICAL HISTORY    finger right pointer--laceration repair HPI: pt is a 84 yo male adm to [facility] with AMS - pt had not slept for several days prior to admit per family.  Pt with PMH + for hip fx s/p surgery with post-op dysphagia requiring feeding tube, COPD, aspiration pna.   Patient has had several swallow studies completed in the past with last one recommending dys3/nectar diet.  He recently had a "touch" of pna and was treated with ABX per daughter.  Subjective: pt awake in chair Assessment / Plan / Recommendation CHL IP CLINICAL IMPRESSIONS 03/28/2018 Clinical Impression Pt presents with a moderate oral and severe pharyngeal dysphagia resulting in aspiration of thin, nectar, honey consistencies and gross residuals across all boluses.  Barium mixed with copious secretions retained in pharynx/larynx.  He is  weak and has very poor laryngeal closure/epiglottic deflection/pharyngeal contraction results in gross vallecular residuals.  Aspiration occurs both during the swallow due to decreased airway closure and after as residuals spill into open airway.  Pt does NOT sense residuals period nor aspiration consistently.  Cued cough is weak and not effective to clear aspirates.  Throughout study pt was instructed to "cough" and "hock" to help clear pharynx.  This strategy decreased residuals but did not elminiate them.  Pt will aspirate regardless of consistency and thicker boluses result in worsening residuals.  He is aspirating secretions at this time. Son Eduard Clos present for exam and he, male attendant and pt were educated to findings/concerns.  Educated them again that tube feeding does NOT prevent aspiration - Again recommend palliative consult for this pt.  Recommend pt consume single small ice chips after oral care only to help with secretion management/comfort.  Pt should be encouraged to cough/hock with all intake.   If pt desires po - it should be with accepted aspiration and for comfort only as pt will be unable to meet nutritional needs by po alone due to level of dysphagia.   SLP Visit Diagnosis Dysphagia, oropharyngeal phase (R13.12);Dysphagia, pharyngoesophageal phase (R13.14) Attention and concentration deficit following -- Frontal lobe and executive function deficit following -- Impact on safety and function Severe aspiration risk;Risk for inadequate nutrition/hydration   CHL IP TREATMENT RECOMMENDATION 03/24/2018 Treatment Recommendations Therapy as outlined in treatment plan below   Prognosis 03/28/2018 Prognosis for Safe Diet Advancement Fair Barriers to Reach Goals Severity of deficits;Time post onset Barriers/Prognosis Comment -- CHL IP DIET RECOMMENDATION 03/28/2018 SLP Diet Recommendations NPO;Ice chips PRN after oral care Liquid Administration via -- Medication Administration -- Compensations --  Postural Changes --   No flowsheet data found.  CHL IP FOLLOW UP RECOMMENDATIONS 03/28/2018 Follow up Recommendations None   CHL IP FREQUENCY AND DURATION 03/28/2018 Speech Therapy Frequency (ACUTE ONLY) min 1 x/week Treatment Duration 1 week      CHL IP ORAL PHASE 03/28/2018 Oral Phase Impaired Oral - Pudding Teaspoon -- Oral - Pudding Cup -- Oral - Honey Teaspoon Reduced posterior propulsion Oral - Honey Cup -- Oral - Nectar Teaspoon Reduced posterior propulsion Oral - Nectar Cup Reduced posterior propulsion Oral - Nectar Straw -- Oral - Thin Teaspoon Reduced posterior propulsion Oral - Thin Cup Reduced posterior propulsion Oral - Thin Straw -- Oral - Puree Reduced posterior propulsion Oral - Mech Soft -- Oral - Regular -- Oral - Multi-Consistency -- Oral - Pill -- Oral Phase - Comment --  CHL IP PHARYNGEAL  PHASE 03/28/2018 Pharyngeal Phase Impaired Pharyngeal- Pudding Teaspoon -- Pharyngeal -- Pharyngeal- Pudding Cup -- Pharyngeal -- Pharyngeal- Honey Teaspoon Reduced pharyngeal peristalsis;Reduced epiglottic inversion;Reduced laryngeal elevation;Reduced airway/laryngeal closure;Pharyngeal residue - valleculae;Reduced tongue base retraction;Penetration/Aspiration during swallow;Penetration/Apiration after swallow;Reduced anterior laryngeal mobility Pharyngeal Material enters airway, passes BELOW cords without attempt by patient to eject out (silent aspiration) Pharyngeal- Honey Cup -- Pharyngeal -- Pharyngeal- Nectar Teaspoon Reduced pharyngeal peristalsis;Reduced epiglottic inversion;Reduced anterior laryngeal mobility;Reduced laryngeal elevation;Reduced airway/laryngeal closure;Reduced tongue base retraction;Penetration/Aspiration during swallow;Penetration/Apiration after swallow;Pharyngeal residue - valleculae;Pharyngeal residue - pyriform Pharyngeal Material enters airway, passes BELOW cords without attempt by patient to eject out (silent aspiration) Pharyngeal- Nectar Cup Reduced pharyngeal  peristalsis;Reduced laryngeal elevation;Reduced epiglottic inversion;Reduced airway/laryngeal closure;Reduced tongue base retraction;Pharyngeal residue - valleculae;Pharyngeal residue - pyriform;Significant aspiration (Amount);Penetration/Aspiration during swallow;Penetration/Apiration after swallow;Reduced anterior laryngeal mobility Pharyngeal Material enters airway, passes BELOW cords without attempt by patient to eject out (silent aspiration);Material enters airway, passes BELOW cords and not ejected out despite cough attempt by patient Pharyngeal- Nectar Straw -- Pharyngeal -- Pharyngeal- Thin Teaspoon Reduced pharyngeal peristalsis;Reduced epiglottic inversion;Reduced anterior laryngeal mobility;Reduced laryngeal elevation;Reduced airway/laryngeal closure;Reduced tongue base retraction;Significant aspiration (Amount);Pharyngeal residue - pyriform;Pharyngeal residue - valleculae Pharyngeal -- Pharyngeal- Thin Cup Reduced pharyngeal peristalsis;Reduced epiglottic inversion;Reduced anterior laryngeal mobility;Reduced laryngeal elevation;Reduced airway/laryngeal closure;Reduced tongue base retraction;Penetration/Aspiration during swallow;Penetration/Apiration after swallow;Pharyngeal residue - valleculae;Pharyngeal residue - pyriform Pharyngeal Material enters airway, passes BELOW cords without attempt by patient to eject out (silent aspiration) Pharyngeal- Thin Straw -- Pharyngeal -- Pharyngeal- Puree Reduced tongue base retraction;Reduced airway/laryngeal closure;Reduced pharyngeal peristalsis;Reduced epiglottic inversion;Reduced anterior laryngeal mobility;Reduced laryngeal elevation;Pharyngeal residue - valleculae Pharyngeal -- Pharyngeal- Mechanical Soft -- Pharyngeal -- Pharyngeal- Regular -- Pharyngeal -- Pharyngeal- Multi-consistency -- Pharyngeal -- Pharyngeal- Pill -- Pharyngeal -- Pharyngeal Comment pt without sensation to gross residuals or minimal aspiration, gross aspiration sensed but pt's reflexive  cough was not strong enough to clear, head turn right did not decrease residual accumulation, chin tuck not tested due to concerns for pyriform residuals spilling more readily into airway after the swallow  CHL IP CERVICAL ESOPHAGEAL PHASE 03/28/2018 Cervical Esophageal Phase Impaired Pudding Teaspoon -- Pudding Cup -- Honey Teaspoon -- Honey Cup -- Nectar Teaspoon -- Nectar Cup -- Nectar Straw -- Thin Teaspoon -- Thin Cup -- Thin Straw -- Puree -- Mechanical Soft -- Regular -- Multi-consistency -- Pill -- Cervical Esophageal Comment decreased UES opening resulting in residuals, pt with shallow pyriform sinuses resulting in increased aspiration Macario Golds 03/28/2018, 2:28 PM Luanna Salk, MS [facility] SLP Acute Rehab Services Pager [phone] Office [phone]                   Scheduled Meds: . [START ON 03/29/2018] aspirin  325 mg Per Tube Daily  . budesonide (PULMICORT) nebulizer solution  0.25 mg Nebulization BID  . [START ON 03/29/2018] calcium-vitamin D  1 tablet Per Tube Daily  . docusate  100 mg Per Tube BID  . enoxaparin (LOVENOX) injection  40 mg Subcutaneous QHS  . feeding supplement (ENSURE ENLIVE)  237 mL Oral TID BM  . free water  200 mL Per Tube Q6H  . [START ON 03/29/2018] levothyroxine  100 mcg Per Tube QAC breakfast  . [START ON 03/29/2018] multivitamin with minerals  1 tablet Per Tube Daily  . pantoprazole sodium  40 mg Per Tube Daily  . polyethylene glycol  17 g Per Tube Daily  . traZODone  50 mg Oral QHS  . umeclidinium-vilanterol  1 puff Inhalation Daily  . verapamil  80 mg  Per Tube TID   Continuous Infusions: . sodium chloride Stopped (03/27/18 2107)  . feeding supplement (JEVITY 1.2 CAL) 1,000 mL (03/28/18 1753)     LOS: 5 days    Time spent: 25 minutes    Hosie Poisson, MD [facility] Pager [phone]  If 7PM-7AM, please contact night-coverage [email or web address] Password [email or web address] 03/28/2018, 6:15 PM

## 2018-03-28 NOTE — Progress Notes (Signed)
Pts son noticed that one of the goals on the white board was comfort,  placed when he was admitted. He asked if we were not feeding him on purpose. I explained that was not the case, then Dr. Karleen Hampshire entered the room to talk with the family.  Speech has spoken in depth with the family the high risk for aspiration and the need for a palliative consult.

## 2018-03-29 ENCOUNTER — Inpatient Hospital Stay (HOSPITAL_COMMUNITY): Payer: Medicare HMO

## 2018-03-29 ENCOUNTER — Inpatient Hospital Stay (HOSPITAL_COMMUNITY)
Admit: 2018-03-29 | Discharge: 2018-03-29 | Disposition: A | Discharge status: Home / Self Care | Payer: Medicare HMO | Attending: Internal Medicine | Admitting: Internal Medicine

## 2018-03-29 DIAGNOSIS — E43 Unspecified severe protein-calorie malnutrition: Secondary | ICD-10-CM

## 2018-03-29 LAB — GLUCOSE, CAPILLARY
GLUCOSE-CAPILLARY: 95 mg/dL (ref 70–99)
Glucose-Capillary: 103 mg/dL — ABNORMAL HIGH (ref 70–99)

## 2018-03-29 LAB — CREATININE, SERUM
Creatinine, Ser: 1.05 mg/dL (ref 0.61–1.24)
GFR calc non Af Amer: >60 mL/min (ref >60)

## 2018-03-29 MED ORDER — JEVITY 1.2 CAL PO LIQD: 1000.0000 mL | ORAL | Status: DC

## 2018-03-29 MED ORDER — JEVITY 1.2 CAL PO LIQD
1000.0000 mL | ORAL | Status: DC
  Administered 2018-03-29 – 2018-04-02 (×5): 1000 mL

## 2018-03-29 MED ORDER — FREE WATER
100.0000 mL | Status: DC
  Administered 2018-03-29 – 2018-04-02 (×26): 100 mL

## 2018-03-29 NOTE — Progress Notes (Signed)
PROGRESS NOTE    [name] Viviano Sr.  JIR:[id number] DOB: [birth year] DOA: 03/22/2018 PCP: Renaldo Reel, DO    Brief Narrative:  [name] Preusser Sr. is a 84 y.o. male with medical history significant for COPD with chronic hypoxic respiratory failure, hypothyroidism, hypertension, and chronic mild confusion and memory loss, now presenting to the emergency department for evaluation of increased confusion, agitation, and hallucinations.   Patient's son at bedside reports that he has been having increased confusion with agitation and hallucinations since Saturday and has not slept since then.  Patient also had about 3 weeks of physical therapy at [facility] and was recently discharged home.  Psychiatric consulted for increased agitation, hallucinations and insomnia. Recommended to use zyprexa prn.   Assessment & Plan:   Principal Problem:   Altered mental status Active Problems:   COPD (chronic obstructive pulmonary disease) with emphysema (HCC)   Chronic respiratory failure (HCC)   HTN (hypertension)   Acute encephalopathy   Hypothyroidism   Protein-calorie malnutrition, severe   Acute encephalopathy Unclear etiology differential includes infection versus dehydration versus delirium.  Initial evaluation with CT head, negative for acute findings.  Patient is able to move all his extremities.  ABG does not show any hypercarbia.  There are no signs of infection so far.  His urine analysis is negative his chest x-ray is negative for pneumonia or acute changes. TSH within normal limits, B12 within normal limits, ammonia level is within normal limits.  Psychiatric consulted for increased agitation and hallucinations, recommended starting zyprexa if EKG is wnl.. Physical therapy evaluation for deconditioning recommending home health PT.  Patient continues to aspirate with eating dysphagia 3 diet,despite restrictions, and family upset about it.  .  Repeat SLP evaluation ordered.underwent  MBS  was found to have severe pharyngeal dysphagia and mod oral dysphagia. Pt is pocketing food and coughing while taking thickened liquids.  speech recommended NPO, with ice chips. Family wanted to feed him via PEG tube  understanding the risk of aspiration. He was started on TUBE feeds after talking to the family.  Ordered MRI brain without contrast to see if he had stroke, to explain his worsening dysphagia . MRI brain was negative for acute stroke.   plan for EEG and if EEG is also negative, will get neurology evaluation for his intermittent confusion and lethargy.   He received 5 days of antibiotics for aspiration pneumonia, PCCM discontinued the antibiotics and repeated CXR . CXR shows clearing up of the previous right sided infiltrate. He is off oxygen and comfortable.   Dietary consulted for restarting the tube feeds.   They do not want palliative care consult at this time for goals of care.    Dehydration: improving. Free water via PEG tube ,  Discussed with RN.   Insomnia:  Persistent.  Prn zyprexa ordered by psychiatry for agitation, delirium and insomnia. Added trazodone for sleep .  Hypertension Better controlled. .   Hypothyroidism TSH within normal limits, resume home dose of Synthroid.    COPD, chronic bronchitis No wheezing heard on exam Albuterol as needed .    DVT prophylaxis: Lovenox Code Status: Full code Family Communication: discussed with son over the phone.  Disposition Plan: home after EEG is done, possibly in the next 24 hours.    Consultants:   Psychiatric  PCCM   Procedures: None  Antimicrobials: none.   Subjective: Alert and able to answer questions no distress. No chest pain or sob. No nausea or vomtiing.    Objective:  Vitals:   03/28/18 1506 03/28/18 2042 03/28/18 2123 03/29/18 0455  BP: 128/67  125/69 122/72  Pulse: (!) 59  72 70  Resp: 18  18 18   Temp: 97.7 F (36.5 C)  98 F (36.7 C) 97.9 F (36.6 C)  TempSrc: Oral  Oral  Oral  SpO2: 90% 92% 92% 96%  Weight:      Height:        Intake/Output Summary (Last 24 hours) at 03/29/2018 1335 Last data filed at 03/29/2018 1045 Gross per 24 hour  Intake 399.41 ml  Output 201 ml  Net 198.41 ml   Filed Weights   03/22/18 2251  Weight: 60.8 kg    Examination:  General exam: alert and answering questions, no distress noted.  Respiratory system:  Bilateral air entry fair, no wheezing or rhonchi.  Cardiovascular system: S1 & S2 heard, RRR.  No pedal edema. Gastrointestinal system: Abdomen is soft NT ND BS+ PEG in place.  Central nervous system: calm. Non focal, able to move all extremities.  Extremities: no pedal edema.  Skin: No rashes, lesions or ulcers Psychiatry: calm. Not agitated    Data Reviewed: I have personally reviewed following labs and imaging studies  CBC: Recent Labs  Lab 03/22/18 1625 03/22/18 2302 03/25/18 0347  WBC 8.3  --  7.0  NEUTROABS 5.8  --   --   HGB 12.0*  --  9.9*  HCT 36.5* 38.5 31.3*  MCV 95.3  --  97.5  PLT 156  --  466*   Basic Metabolic Panel: Recent Labs  Lab 03/22/18 1625 03/23/18 0344 03/24/18 0907 03/25/18 0347 03/29/18 0424  NA 143 142 144 143  --   K 4.3 4.2 3.9 4.1  --   CL 106 107 112* 113*  --   CO2 28 25 24 23   --   GLUCOSE 91 104* 67* 96  --   BUN 24* 25* 30* 32*  --   CREATININE 0.88 0.81 0.83 0.95 1.05  CALCIUM 9.3 9.0 8.4* 8.3*  --   MG  --   --  2.4  --   --    GFR: Estimated Creatinine Clearance: 40.2 mL/min (by C-G formula based on SCr of 1.05 mg/dL). Liver Function Tests: Recent Labs  Lab 03/22/18 1625  AST 27  ALT 23  ALKPHOS 78  BILITOT 0.7  PROT 7.4  ALBUMIN 3.5   No results for input(s): LIPASE, AMYLASE in the last 168 hours. Recent Labs  Lab 03/22/18 2302  AMMONIA 20   Coagulation Profile: No results for input(s): INR, PROTIME in the last 168 hours. Cardiac Enzymes: No results for input(s): CKTOTAL, CKMB, CKMBINDEX, TROPONINI in the last 168 hours. BNP (last 3  results) Recent Labs    09/23/17 1139 01/12/18 1613  PROBNP 120.0* 123.0*   HbA1C: No results for input(s): HGBA1C in the last 72 hours. CBG: Recent Labs  Lab 03/29/18 1157  GLUCAP 95   Lipid Profile: No results for input(s): CHOL, HDL, LDLCALC, TRIG, CHOLHDL, LDLDIRECT in the last 72 hours. Thyroid Function Tests: No results for input(s): TSH, T4TOTAL, FREET4, T3FREE, THYROIDAB in the last 72 hours. Anemia Panel: No results for input(s): VITAMINB12, FOLATE, FERRITIN, TIBC, IRON, RETICCTPCT in the last 72 hours. Sepsis Labs: No results for input(s): PROCALCITON, LATICACIDVEN in the last 168 hours.  No results found for this or any previous visit (from the past 240 hour(s)).       Radiology Studies: Mr Brain Wo Contrast  Result Date: 03/29/2018 CLINICAL DATA:  Encephalopathy. Altered level of consciousness. Agitation and hallucinations since Saturday. EXAM: MRI HEAD WITHOUT CONTRAST TECHNIQUE: Multiplanar, multiecho pulse sequences of the brain and surrounding structures were obtained without intravenous contrast. COMPARISON:  Head CT from 03/22/2018 and 10/06/2012 FINDINGS: Brain: No acute infarction, hemorrhage, hydrocephalus, extra-axial collection or mass lesion. Moderate atrophy and chronic small vessel ischemia in the cerebral white matter. Vascular: Major flow voids are preserved. Strong dominance of the right vertebral artery. Skull and upper cervical spine: No evident marrow lesion. Sinuses/Orbits: Chronic left maxillary sinusitis with atelectasis. There is enophthalmos on the left, compatible with silent sinus syndrome. Bilateral cataract resection. IMPRESSION: 1. No acute finding. 2. Moderate atrophy and chronic small vessel ischemia. Electronically Signed   By: Monte Fantasia M.D.   On: 03/29/2018 08:52   Dg Chest Port 1 View  Result Date: 03/29/2018 CLINICAL DATA:  84 year old male. Aspiration. Subsequent encounter. EXAM: PORTABLE CHEST 1 VIEW COMPARISON:   03/24/2018 chest x-ray. FINDINGS: Interval improved aeration left lung base. Persistent right lung base opacity suggestive of atelectasis. Infiltrate less likely consideration. No pulmonary edema or pneumothorax. Heart size within normal limits. Calcified mildly tortuous aorta. Scoliosis thoracic spine. IMPRESSION: 1. Interval improved aeration left lung base. Persistent right lung base opacity suggestive of atelectasis. Infiltrate less likely consideration. 2.  Aortic Atherosclerosis (ICD10-I70.0). Electronically Signed   By: Genia Del M.D.   On: 03/29/2018 08:35   Dg Swallowing Func-speech Pathology  Result Date: 03/28/2018 Objective Swallowing Evaluation: Type of Study: MBS-Modified Barium Swallow Study  Patient Details Name: Delvis Kau Sr. MRN: [id number] Date of Birth: 12-07-1924 Today's Date: 03/28/2018 Time: SLP Start Time (ACUTE ONLY): 1315 -SLP Stop Time (ACUTE ONLY): 1357 SLP Time Calculation (min) (ACUTE ONLY): 42 min Past Medical History: Past Medical History: Diagnosis Date . Anemia  . COPD (chronic obstructive pulmonary disease) (HCC)   Mild . HTN (hypertension)  . Hypothyroidism  Past Surgical History: Past Surgical History: Procedure Laterality Date . CATARACT EXTRACTION Bilateral  . GALLBLADDER SURGERY  02/21/2011 . OTHER SURGICAL HISTORY    finger right pointer--laceration repair HPI: pt is a 84 yo male adm to [facility] with AMS - pt had not slept for several days prior to admit per family.  Pt with PMH + for hip fx s/p surgery with post-op dysphagia requiring feeding tube, COPD, aspiration pna.   Patient has had several swallow studies completed in the past with last one recommending dys3/nectar diet.  He recently had a "touch" of pna and was treated with ABX per daughter.  Subjective: pt awake in chair Assessment / Plan / Recommendation CHL IP CLINICAL IMPRESSIONS 03/28/2018 Clinical Impression Pt presents with a moderate oral and severe pharyngeal dysphagia resulting in aspiration of thin,  nectar, honey consistencies and gross residuals across all boluses.  Barium mixed with copious secretions retained in pharynx/larynx.  He is weak and has very poor laryngeal closure/epiglottic deflection/pharyngeal contraction results in gross vallecular residuals.  Aspiration occurs both during the swallow due to decreased airway closure and after as residuals spill into open airway.  Pt does NOT sense residuals period nor aspiration consistently.  Cued cough is weak and not effective to clear aspirates.  Throughout study pt was instructed to "cough" and "hock" to help clear pharynx.  This strategy decreased residuals but did not elminiate them.  Pt will aspirate regardless of consistency and thicker boluses result in worsening residuals.  He is aspirating secretions at this time. Son Eduard Clos present for exam and he, male attendant and pt were educated to  findings/concerns.  Educated them again that tube feeding does NOT prevent aspiration - Again recommend palliative consult for this pt.  Recommend pt consume single small ice chips after oral care only to help with secretion management/comfort.  Pt should be encouraged to cough/hock with all intake.   If pt desires po - it should be with accepted aspiration and for comfort only as pt will be unable to meet nutritional needs by po alone due to level of dysphagia.   SLP Visit Diagnosis Dysphagia, oropharyngeal phase (R13.12);Dysphagia, pharyngoesophageal phase (R13.14) Attention and concentration deficit following -- Frontal lobe and executive function deficit following -- Impact on safety and function Severe aspiration risk;Risk for inadequate nutrition/hydration   CHL IP TREATMENT RECOMMENDATION 03/24/2018 Treatment Recommendations Therapy as outlined in treatment plan below   Prognosis 03/28/2018 Prognosis for Safe Diet Advancement Fair Barriers to Reach Goals Severity of deficits;Time post onset Barriers/Prognosis Comment -- CHL IP DIET RECOMMENDATION  03/28/2018 SLP Diet Recommendations NPO;Ice chips PRN after oral care Liquid Administration via -- Medication Administration -- Compensations -- Postural Changes --   No flowsheet data found.  CHL IP FOLLOW UP RECOMMENDATIONS 03/28/2018 Follow up Recommendations None   CHL IP FREQUENCY AND DURATION 03/28/2018 Speech Therapy Frequency (ACUTE ONLY) min 1 x/week Treatment Duration 1 week      CHL IP ORAL PHASE 03/28/2018 Oral Phase Impaired Oral - Pudding Teaspoon -- Oral - Pudding Cup -- Oral - Honey Teaspoon Reduced posterior propulsion Oral - Honey Cup -- Oral - Nectar Teaspoon Reduced posterior propulsion Oral - Nectar Cup Reduced posterior propulsion Oral - Nectar Straw -- Oral - Thin Teaspoon Reduced posterior propulsion Oral - Thin Cup Reduced posterior propulsion Oral - Thin Straw -- Oral - Puree Reduced posterior propulsion Oral - Mech Soft -- Oral - Regular -- Oral - Multi-Consistency -- Oral - Pill -- Oral Phase - Comment --  CHL IP PHARYNGEAL PHASE 03/28/2018 Pharyngeal Phase Impaired Pharyngeal- Pudding Teaspoon -- Pharyngeal -- Pharyngeal- Pudding Cup -- Pharyngeal -- Pharyngeal- Honey Teaspoon Reduced pharyngeal peristalsis;Reduced epiglottic inversion;Reduced laryngeal elevation;Reduced airway/laryngeal closure;Pharyngeal residue - valleculae;Reduced tongue base retraction;Penetration/Aspiration during swallow;Penetration/Apiration after swallow;Reduced anterior laryngeal mobility Pharyngeal Material enters airway, passes BELOW cords without attempt by patient to eject out (silent aspiration) Pharyngeal- Honey Cup -- Pharyngeal -- Pharyngeal- Nectar Teaspoon Reduced pharyngeal peristalsis;Reduced epiglottic inversion;Reduced anterior laryngeal mobility;Reduced laryngeal elevation;Reduced airway/laryngeal closure;Reduced tongue base retraction;Penetration/Aspiration during swallow;Penetration/Apiration after swallow;Pharyngeal residue - valleculae;Pharyngeal residue - pyriform Pharyngeal Material enters  airway, passes BELOW cords without attempt by patient to eject out (silent aspiration) Pharyngeal- Nectar Cup Reduced pharyngeal peristalsis;Reduced laryngeal elevation;Reduced epiglottic inversion;Reduced airway/laryngeal closure;Reduced tongue base retraction;Pharyngeal residue - valleculae;Pharyngeal residue - pyriform;Significant aspiration (Amount);Penetration/Aspiration during swallow;Penetration/Apiration after swallow;Reduced anterior laryngeal mobility Pharyngeal Material enters airway, passes BELOW cords without attempt by patient to eject out (silent aspiration);Material enters airway, passes BELOW cords and not ejected out despite cough attempt by patient Pharyngeal- Nectar Straw -- Pharyngeal -- Pharyngeal- Thin Teaspoon Reduced pharyngeal peristalsis;Reduced epiglottic inversion;Reduced anterior laryngeal mobility;Reduced laryngeal elevation;Reduced airway/laryngeal closure;Reduced tongue base retraction;Significant aspiration (Amount);Pharyngeal residue - pyriform;Pharyngeal residue - valleculae Pharyngeal -- Pharyngeal- Thin Cup Reduced pharyngeal peristalsis;Reduced epiglottic inversion;Reduced anterior laryngeal mobility;Reduced laryngeal elevation;Reduced airway/laryngeal closure;Reduced tongue base retraction;Penetration/Aspiration during swallow;Penetration/Apiration after swallow;Pharyngeal residue - valleculae;Pharyngeal residue - pyriform Pharyngeal Material enters airway, passes BELOW cords without attempt by patient to eject out (silent aspiration) Pharyngeal- Thin Straw -- Pharyngeal -- Pharyngeal- Puree Reduced tongue base retraction;Reduced airway/laryngeal closure;Reduced pharyngeal peristalsis;Reduced epiglottic inversion;Reduced anterior laryngeal mobility;Reduced laryngeal elevation;Pharyngeal residue - valleculae Pharyngeal --  Pharyngeal- Mechanical Soft -- Pharyngeal -- Pharyngeal- Regular -- Pharyngeal -- Pharyngeal- Multi-consistency -- Pharyngeal -- Pharyngeal- Pill --  Pharyngeal -- Pharyngeal Comment pt without sensation to gross residuals or minimal aspiration, gross aspiration sensed but pt's reflexive cough was not strong enough to clear, head turn right did not decrease residual accumulation, chin tuck not tested due to concerns for pyriform residuals spilling more readily into airway after the swallow  CHL IP CERVICAL ESOPHAGEAL PHASE 03/28/2018 Cervical Esophageal Phase Impaired Pudding Teaspoon -- Pudding Cup -- Honey Teaspoon -- Honey Cup -- Nectar Teaspoon -- Nectar Cup -- Nectar Straw -- Thin Teaspoon -- Thin Cup -- Thin Straw -- Puree -- Mechanical Soft -- Regular -- Multi-consistency -- Pill -- Cervical Esophageal Comment decreased UES opening resulting in residuals, pt with shallow pyriform sinuses resulting in increased aspiration Macario Golds 03/28/2018, 2:28 PM Luanna Salk, MS [facility] SLP Acute Rehab Services Pager [phone] Office [phone]                   Scheduled Meds: . aspirin  325 mg Per Tube Daily  . budesonide (PULMICORT) nebulizer solution  0.25 mg Nebulization BID  . calcium-vitamin D  1 tablet Per Tube Daily  . docusate  100 mg Per Tube BID  . enoxaparin (LOVENOX) injection  40 mg Subcutaneous QHS  . feeding supplement (JEVITY 1.2 CAL)  1,000 mL Per Tube Q24H  . free water  100 mL Per Tube Q4H  . levothyroxine  100 mcg Per Tube QAC breakfast  . multivitamin with minerals  1 tablet Per Tube Daily  . pantoprazole sodium  40 mg Per Tube Daily  . polyethylene glycol  17 g Per Tube Daily  . traZODone  50 mg Oral QHS  . umeclidinium-vilanterol  1 puff Inhalation Daily  . verapamil  80 mg Per Tube TID   Continuous Infusions: . sodium chloride Stopped (03/27/18 2107)     LOS: 6 days    Time spent: 25 minutes    Hosie Poisson, MD [facility] Pager [phone]  If 7PM-7AM, please contact night-coverage [email or web address] Password [email or web address] 03/29/2018, 1:35 PM

## 2018-03-29 NOTE — Progress Notes (Signed)
OT Cancellation Note  Patient Details Name: [name] Gatley Sr. MRN: [id number] DOB: Mar 27, 1925   Cancelled Treatment:    Reason Eval/Treat Not Completed: Other (comment).  Pt just got back to bed. Will check back tomorrow if schedule permits.  [name] [name] 03/29/2018, 2:28 PM  Lesle Chris, OTR/L Acute Rehabilitation Services [phone] [facility] pager [phone] office 03/29/2018

## 2018-03-29 NOTE — Progress Notes (Signed)
EEG complete - results pending 

## 2018-03-29 NOTE — Progress Notes (Signed)
NAME:  [name] Whitehorn., MRN:  [id number], DOB:  Jul 14, 1924, LOS: 6 ADMISSION DATE:  03/22/2018, CONSULTATION DATE:  03/24/2018 REFERRING MD:  Karleen Hampshire, CHIEF COMPLAINT:  Confusion   Brief History   84 y/o male with a past medical history of recurrent aspiration pneumonia, COPD and CHF was admitted for further evaluation of agitated delirium   Past Medical History  Chronic kidney disease, Anemia, Hypothyroidism, COPD, Recurrent aspiration, Recent right hip fracture  Significant Hospital Events   October 8 admission Mental status is a lot better  Consults: date of consult/date signed off & final recs:  October 10 pulmonary  Procedures (surgical and bedside):  No procedures  Significant Diagnostic Tests:  Swallowing study  Micro Data:    Antimicrobials:  10/10 Azithro> 10/13 10/10 Zosyn >  10/13  Subjective:  More awake   Objective   Blood pressure 122/72, pulse 70, temperature 97.9 F (36.6 C), temperature source Oral, resp. rate 18, height 5\' 6"  (1.676 m), weight 60.8 kg, SpO2 96 %.        Intake/Output Summary (Last 24 hours) at 03/29/2018 1140 Last data filed at 03/29/2018 1045 Gross per 24 hour  Intake 399.41 ml  Output 201 ml  Net 198.41 ml   Filed Weights   03/22/18 2251  Weight: 60.8 kg    Examination:  General: 84 year old gentleman, comfortable, follows commands hENT: Moist oral mucosa  Pulm: Faint rhonchi bilaterally Card: S1-S2 appreciate abd abdomen is soft, bowel sounds appreciated Ext: Dry, edema Gu: Good output Neuro: Moving all extremities with no focal findings   Resolved Hospital Problem list   Agitation is improving Atelectasis at right base did improve  Assessment & Plan:  1.  Agitated delirium, toxic encephalopathy -On Zyprexa, stable, mental status improving  2.  Dysphagia with aspiration event -Aspiration precautions -Oral intake as tolerated  3.  COPD -Bronchodilators -Maintenance bronchodilators including Anoro,  Pulmicort  4.  Chronic diastolic heart failure -Chronic continue monitoring, on free water -No evidence of fluid overload ] 5.  Atelectasis left base -Continue support -Aspiration precautions  Will sign off Call as needed   Disposition / Summary of Today's Plan 03/29/18   Discontinued Zosyn Repeat chest x-ray in the morning    Diet: dysphagia 3, nectar thick DVT prophylaxis: lovenox Hyperglycemia protocol: per [facility] Mobility: PT consult Code Status: Full for now Family Communication: updated son/daughter bedside  Labs   CBC: Recent Labs  Lab 03/22/18 1625 03/22/18 2302 03/25/18 0347  WBC 8.3  --  7.0  NEUTROABS 5.8  --   --   HGB 12.0*  --  9.9*  HCT 36.5* 38.5 31.3*  MCV 95.3  --  97.5  PLT 156  --  145*    Basic Metabolic Panel: Recent Labs  Lab 03/22/18 1625 03/23/18 0344 03/24/18 0907 03/25/18 0347 03/29/18 0424  NA 143 142 144 143  --   K 4.3 4.2 3.9 4.1  --   CL 106 107 112* 113*  --   CO2 28 25 24 23   --   GLUCOSE 91 104* 67* 96  --   BUN 24* 25* 30* 32*  --   CREATININE 0.88 0.81 0.83 0.95 1.05  CALCIUM 9.3 9.0 8.4* 8.3*  --   MG  --   --  2.4  --   --    GFR: Estimated Creatinine Clearance: 40.2 mL/min (by C-G formula based on SCr of 1.05 mg/dL). Recent Labs  Lab 03/22/18 1625 03/25/18 0347  WBC 8.3 7.0  Liver Function Tests: Recent Labs  Lab 03/22/18 1625  AST 27  ALT 23  ALKPHOS 78  BILITOT 0.7  PROT 7.4  ALBUMIN 3.5   No results for input(s): LIPASE, AMYLASE in the last 168 hours. Recent Labs  Lab 03/22/18 2302  AMMONIA 20    ABG    Component Value Date/Time   PHART 7.438 03/22/2018 2013   PCO2ART 38.8 03/22/2018 2013   PO2ART 79.6 (L) 03/22/2018 2013   HCO3 25.8 03/22/2018 2013   O2SAT 95.4 03/22/2018 2013     Coagulation Profile: No results for input(s): INR, PROTIME in the last 168 hours.  Cardiac Enzymes: No results for input(s): CKTOTAL, CKMB, CKMBINDEX, TROPONINI in the last 168 hours.  HbA1C: No  results found for: HGBA1C  CBG: No results for input(s): GLUCAP in the last 168 hours.  [name] Olalere.  MD Cell: [phone]

## 2018-03-29 NOTE — Progress Notes (Signed)
  Speech Language Pathology Treatment: Dysphagia  Patient Details Name: [name] Ravenscroft Sr. MRN: [id number] DOB: 1925-03-20 Today's Date: 03/29/2018 Time: 1340-1430 [name] Time Calculation (min) (ACUTE ONLY): 50 min  Assessment / Plan / Recommendation Clinical Impression  Pt seen for education of family re: importance of oral care with tube feeding to decrease aspiration pna risk, follow up [name] [name] recommended to maximize pt's swallowing rehab, goals for indication for repeat MBS prior to po administration *strengthen "hock" and cough, management of secretions, tolerance of ice chips.  Provided written information re: oral flora changes with npo, tube fed patients.  Pt was provided oral care by RN and single ice chips.  Swallows followed by productive cough to viscous secretions.  Advised pt continue ice chips after oral care to help manage secretions - oropharyngeal clearance, decrease disuse muscle atrophy, and maximize comfort.   Observed pt using oral suction himself adequately - posted sign requesting oral suction to be left in pt's hand for his use.    Asked son [name] [name] to request his sister read about palliative care - reviewed that it is NOT hospice and evidence supports its use.  He advised his understanding.    [name] contact dietician via pager regarding [name] [name]'s question about need for tube feeding pump at home.  Upon [name] leaving, [name] [name] was going to see pt/son.  Thank you!   Written instructions provided for [name] [name] regarding findings of MBS and follow up requested.  At this time, no further acute [name] indicated as pt npo x single ice chips after oral care and is using tube feeding.  All education completed using teach back, [name] to sign off.        HPI HPI: pt is a 84 yo male adm to [facility] with AMS - pt had not slept for several days prior to admit per family.  Pt with PMH + for hip fx s/p surgery with post-op dysphagia requiring feeding tube, COPD, aspiration pna.   Patient has had several  swallow studies completed in the past with last one recommending dys3/nectar diet.  He recently had a "touch" of pna and was treated with ABX per daughter.      [name] Plan  All goals met       Recommendations  Medication Administration: Via alternative means Compensations: Slow rate                Oral Care Recommendations: Oral care BID [name] Visit Diagnosis: Dysphagia, pharyngeal phase (R13.13) Plan: All goals met       GO              [name] [name], [name] [name] [name] Acute Rehab Services Pager [phone] Office [phone]   [name] [name] 03/29/2018, 2:52 PM

## 2018-03-29 NOTE — Progress Notes (Signed)
Physical Therapy Treatment Patient Details Name: [name] Moncus Sr. MRN: [id number] DOB: April 13, 1925 Today's Date: 03/29/2018    History of Present Illness 84 y.o. male with medical history significant for COPD , hypothyroidism, hypertension, and chronic mild confusion and memory loss, R hip fracture/ Hemiarthroplasty in July/19, presenting to the ED 03/22/18 for evaluation of increased confusion, agitation, and hallucinations.      PT Comments    Pt ambulated 120' with RW, no loss of balance, min/guard for safety and verbal cues to increase step length. Performed BLE exercises with min assist. Pt progressing well. Pt's son stated he plans to care for pt follow acute DC.    Follow Up Recommendations  Home health PT;Supervision/Assistance - 24 hour     Equipment Recommendations  None recommended by PT    Recommendations for Other Services       Precautions / Restrictions Precautions Precautions: Posterior Hip;Fall Precaution Comments: right hip hemiarthroplasty 11/2017 posterior, Has a PEG from hospital stay in july; son reports pt has had 3 falls in past 1 year, none since hip fx in June 2019 Restrictions Weight Bearing Restrictions: No    Mobility  Bed Mobility Overal bed mobility: Needs Assistance Bed Mobility: Supine to Sit     Supine to sit: Min assist;HOB elevated     General bed mobility comments: assist to pivot hips to edge of bed  Transfers Overall transfer level: Needs assistance Equipment used: Rolling walker (2 wheeled) Transfers: Sit to/from Stand Sit to Stand: Min assist         General transfer comment: Assist to steady. VCS safety, hand placement  Ambulation/Gait Ambulation/Gait assistance: Min guard Gait Distance (Feet): 120 Feet Assistive device: Rolling walker (2 wheeled) Gait Pattern/deviations: Step-through pattern;Decreased stride length;Trunk flexed;Narrow base of support     General Gait Details: VCs to increase step length, no loss of  balance, 1-2/4 dyspnea   Stairs             Wheelchair Mobility    Modified Rankin (Stroke Patients Only)       Balance Overall balance assessment: Needs assistance         Standing balance support: Bilateral upper extremity supported Standing balance-Leahy Scale: Poor                              Cognition Arousal/Alertness: Awake/alert Behavior During Therapy: WFL for tasks assessed/performed Overall Cognitive Status: History of cognitive impairments - at baseline                                        Exercises General Exercises - Lower Extremity Ankle Circles/Pumps: AROM;10 reps;Both;Supine Heel Slides: AAROM;Both;10 reps;Supine Hip ABduction/ADduction: AAROM;Both;10 reps;Supine    General Comments        Pertinent Vitals/Pain Pain Assessment: No/denies pain    Home Living                      Prior Function            PT Goals (current goals can now be found in the care plan section) Acute Rehab PT Goals Patient Stated Goal: family's:  aggressive therapy to get pt stronger PT Goal Formulation: With patient/family Time For Goal Achievement: 04/07/18 Potential to Achieve Goals: Fair Progress towards PT goals: Progressing toward goals    Frequency    Min 3X/week  PT Plan Current plan remains appropriate    Co-evaluation              AM-PAC PT "6 Clicks" Daily Activity  Outcome Measure  Difficulty turning over in bed (including adjusting bedclothes, sheets and blankets)?: A Lot Difficulty moving from lying on back to sitting on the side of the bed? : A Lot Difficulty sitting down on and standing up from a chair with arms (e.g., wheelchair, bedside commode, etc,.)?: A Little Help needed moving to and from a bed to chair (including a wheelchair)?: A Little Help needed walking in hospital room?: A Little Help needed climbing 3-5 steps with a railing? : A Lot 6 Click Score: 15    End of  Session Equipment Utilized During Treatment: Gait belt Activity Tolerance: Patient tolerated treatment well Patient left: in chair;with call bell/phone within reach;with family/visitor present Nurse Communication: Mobility status PT Visit Diagnosis: Unsteadiness on feet (R26.81);Muscle weakness (generalized) (M62.81)     Time: 8648-4720 PT Time Calculation (min) (ACUTE ONLY): 20 min  Charges:  $Gait Training: 8-22 mins                    Blondell Reveal Kistler PT 03/29/2018  Acute Rehabilitation Services Pager [phone] Office [phone]

## 2018-03-29 NOTE — Progress Notes (Signed)
Initial Nutrition Assessment  DOCUMENTATION CODES:   Severe malnutrition in context of chronic illness  INTERVENTION:   Monitor magnesium, potassium, and phosphorus daily for at least 3 days, MD to replete as needed, as pt is at risk for refeeding syndrome given malnutrition status and questionable TF infusions at home.  -Continue Jevity 1.2 @ 20 ml/hr via PEG, advance every 6 hours to goal rate of 55 ml/hr. -Recommend free water flushes of 100 ml every 4 hours (600 ml). -This regimen at goal rate provides 1584 kcal, 73g protein and 1665 ml H2O.  NUTRITION DIAGNOSIS:   Severe Malnutrition related to chronic illness, dysphagia(aspiration) as evidenced by energy intake < or equal to 75% for > or equal to 1 month, mild fat depletion, severe muscle depletion.  GOAL:   Patient will meet greater than or equal to 90% of their needs  MONITOR:   Labs, Weight trends, TF tolerance, I & O's  REASON FOR ASSESSMENT:   Consult Enteral/tube feeding initiation and management, Assessment of nutrition requirement/status  ASSESSMENT:   84 y.o. male with medical history significant for COPD with chronic hypoxic respiratory failure, hypothyroidism, hypertension, and chronic mild confusion and memory loss, now presenting to the emergency department for evaluation of increased confusion, agitation, and hallucinations.    Patient sleeping in room with no family at bedside. Spoke with RN and alerted her that pt's TF pump was beeping. Pt receiving Osmolite formula, at 20 ml/hr. Will use this up and can initiate Jevity 1.2 @ 30 ml/hr this afternoon. RN states she will relay message that pt is to not have large boluses of Ensure via tube. Requested free water flush recommendations, will adjust.  Per conversation with SLP today, pt failed MBS. Recommended pt remain NPO. Recommended palliative care consult but family is refusing at this time. Family was told TF will not prevent aspiration.   Per weight  records, pt has lost 8 lb since 4/11 (6% wt loss x 6 months, insignificant for time frame).  Labs reviewed. Medications: OSCAL-D tablet daily, Multivitamin with minerals daily, Miralax packet daily  NUTRITION - FOCUSED PHYSICAL EXAM:    Most Recent Value  Orbital Region  Mild depletion  Upper Arm Region  Unable to assess  Thoracic and Lumbar Region  Unable to assess  Buccal Region  Mild depletion  Temple Region  Severe depletion  Clavicle Bone Region  Severe depletion  Clavicle and Acromion Bone Region  Severe depletion  Scapular Bone Region  Unable to assess  Dorsal Hand  Moderate depletion  Patellar Region  Unable to assess  Anterior Thigh Region  Unable to assess  Posterior Calf Region  Unable to assess  Edema (RD Assessment)  None       Diet Order:   Diet Order            Diet NPO time specified Except for: Ice Chips  Diet effective now              EDUCATION NEEDS:   Not appropriate for education at this time  Skin:  Skin Assessment: Reviewed RN Assessment  Last BM:  10/14  Height:   Ht Readings from Last 1 Encounters:  03/22/18 5\' 6"  (1.676 m)    Weight:   Wt Readings from Last 1 Encounters:  03/22/18 60.8 kg    Ideal Body Weight:  64.5 kg  BMI:  Body mass index is 21.63 kg/m.  Estimated Nutritional Needs:   Kcal:  1550-1750  Protein:  80-90g  Fluid:  1.8L/day   Clayton Bibles, MS, RD, LDN [place] Dietitian Pager: [phone] After Hours Pager: [phone]

## 2018-03-29 NOTE — Procedures (Signed)
ELECTROENCEPHALOGRAM REPORT   Patient: [name] Calixto Sr.       Room #: 3903  Age: 84 y.o.        Sex: male Referring Physician: Karleen Hampshire Report Date:  03/29/2018        Interpreting Physician: Alexis Goodell  History: [name] is an 84 y.o. male with altered mental status  Medications:  ASA, Oscal, Synthroid, Protonix, MVI, Miralax, Desyrel, Anoro Ellipta, Calan  Conditions of Recording:  This is a 21 channel routine scalp EEG performed with bipolar and monopolar montages arranged in accordance to the international 10/20 system of electrode placement. One channel was dedicated to EKG recording.  The patient is in the awake and drowsy states.  Description:  Muscle and movement artifact are prominent during the recording and often obscure the background rhythm.  When able to be visualized the waking background activity consists of a low voltage, symmetrical, fairly well organized, 7 Hz theta activity, seen from the parieto-occipital and posterior temporal regions.  Low voltage fast activity, poorly organized, is seen anteriorly and is at times superimposed on more posterior regions.  A mixture of theta and alpha rhythms are seen from the central and temporal regions.  There are frequent episodes of slowing that resemble drowse.  These episodes are dominated by polymorphic delta activity.  There appears to be some intermittent discharges of triphasic morphology.  At times these are difficult to distinguish from artifact. Hyperventilation and intermittent photic stimulation were not performed.   IMPRESSION: This is an abnormal electroencephalogram secondary to background slowing with intermittent triphasic waves consistent with an encephalopathy that is etiologically nonspecific.   Alexis Goodell, MD Neurology [phone] 03/29/2018, 8:21 PM

## 2018-03-30 ENCOUNTER — Ambulatory Visit: Payer: Medicare HMO | Admitting: Pulmonary Disease

## 2018-03-30 DIAGNOSIS — E43 Unspecified severe protein-calorie malnutrition: Secondary | ICD-10-CM

## 2018-03-30 LAB — BASIC METABOLIC PANEL
Anion gap: 7 (ref 5–15)
BUN: 22 mg/dL (ref 8–23)
CHLORIDE: 102 mmol/L (ref 98–111)
CO2: 30 mmol/L (ref 22–32)
Calcium: 8.6 mg/dL — ABNORMAL LOW (ref 8.9–10.3)
Creatinine, Ser: 0.83 mg/dL (ref 0.61–1.24)
GFR calc Af Amer: >60 mL/min (ref >60)
Glucose, Bld: 131 mg/dL — ABNORMAL HIGH (ref 70–99)
POTASSIUM: 3.9 mmol/L (ref 3.5–5.1)
SODIUM: 139 mmol/L (ref 135–145)

## 2018-03-30 LAB — GLUCOSE, CAPILLARY
GLUCOSE-CAPILLARY: 102 mg/dL — AB (ref 70–99)
GLUCOSE-CAPILLARY: 128 mg/dL — AB (ref 70–99)
GLUCOSE-CAPILLARY: 106 mg/dL — AB (ref 70–99)
Glucose-Capillary: 119 mg/dL — ABNORMAL HIGH (ref 70–99)
Glucose-Capillary: 105 mg/dL — ABNORMAL HIGH (ref 70–99)
Glucose-Capillary: 121 mg/dL — ABNORMAL HIGH (ref 70–99)
Glucose-Capillary: 88 mg/dL (ref 70–99)

## 2018-03-30 LAB — CBC
HCT: 33.3 % — ABNORMAL LOW (ref 39.0–52.0)
HEMOGLOBIN: 11 g/dL — AB (ref 13.0–17.0)
MCH: 31.7 pg (ref 26.0–34.0)
MCHC: 33 g/dL (ref 30.0–36.0)
MCV: 96 fL (ref 80.0–100.0)
NRBC: 0 % (ref 0.0–0.2)
PLATELETS: 150 10*3/uL (ref 150–400)
RBC: 3.47 MIL/uL — AB (ref 4.22–5.81)
RDW: 12.6 % (ref 11.5–15.5)
WBC: 4.8 10*3/uL (ref 4.0–10.5)

## 2018-03-30 NOTE — Progress Notes (Signed)
Occupational Therapy Treatment Patient Details Name: [name] Nichelson Sr. MRN: [id number] DOB: [birth year] Today's Date: 03/30/2018    History of present illness 84 y.o. male with medical history significant for COPD , hypothyroidism, hypertension, and chronic mild confusion and memory loss, R hip fracture/ Hemiarthroplasty in July/19, presenting to the ED 03/22/18 for evaluation of increased confusion, agitation, and hallucinations.     OT comments  Pt with good participation with BUE AROM   Follow Up Recommendations  Home health OT;Supervision/Assistance - 24 hour    Equipment Recommendations  None recommended by OT    Recommendations for Other Services      Precautions / Restrictions Precautions Precautions: Posterior Hip;Fall Precaution Comments: right hip hemiarthroplasty 11/2017 posterior, Has a PEG from hospital stay in july; son reports pt has had 3 falls in past 1 year, none since hip fx in June 2019 Restrictions Weight Bearing Restrictions: No              ADL either performed or assessed with clinical judgement   ADL Overall ADL's : Needs assistance/impaired     Grooming: Minimal assistance;Bed level Grooming Details (indicate cue type and reason): HOB raised                               General ADL Comments: Pt agreed to BUE exercise as he had just gotten back to bed               Cognition Arousal/Alertness: Awake/alert Behavior During Therapy: WFL for tasks assessed/performed Overall Cognitive Status: History of cognitive impairments - at baseline                                 General Comments: pleasant        Exercises General Exercises - Upper Extremity Shoulder Flexion: AROM;20 reps;Both Shoulder Extension: AROM;20 reps;Both;Supine Shoulder ABduction: 20 reps;Both;AAROM;Supine Shoulder ADduction: AROM;Both;20 reps;Supine Elbow Flexion: AROM;Both;20 reps;Supine Elbow Extension: AROM;Both;20 reps;Supine Wrist  Flexion: AROM;Both;20 reps Wrist Extension: AROM;20 reps;Both;Supine Digit Composite Flexion: AROM;Both;10 reps;Supine Composite Extension: AROM;Both;10 reps;Supine            Frequency           Progress Toward Goals  OT Goals(current goals can now be found in the care plan section)  Progress towards OT goals: Progressing toward goals     Plan Discharge plan remains appropriate       AM-PAC PT "6 Clicks" Daily Activity     Outcome Measure   Help from another person eating meals?: A Little Help from another person taking care of personal grooming?: A Little Help from another person toileting, which includes using toliet, bedpan, or urinal?: A Lot Help from another person bathing (including washing, rinsing, drying)?: A Lot Help from another person to put on and taking off regular upper body clothing?: A Little Help from another person to put on and taking off regular lower body clothing?: A Lot 6 Click Score: 15    End of Session    OT Visit Diagnosis: Unsteadiness on feet (R26.81);Muscle weakness (generalized) (M62.81)   Activity Tolerance Patient tolerated treatment well   Patient Left in bed;with call bell/phone within reach;with bed alarm set   Nurse Communication          Time: 7371-0626 OT Time Calculation (min): 10 min  Charges: OT General Charges $OT Visit: 1 Visit OT Treatments $Therapeutic Exercise: 8-22  mins  Kari Baars, [place] Acute Rehabilitation Services [phone] Office- [phone], Thereasa Parkin 03/30/2018, 7:22 PM

## 2018-03-30 NOTE — Progress Notes (Signed)
PROGRESS NOTE    [name] Silverio Sr.  JHE:[id number] DOB: [birth year] DOA: 03/22/2018 PCP: Renaldo Reel, DO   Brief Narrative: [name] Zorn Sr. is a 84 y.o. male with a history of COPD, hypothyroidism, hypertension, cognitive impairment.  Patient presented secondary to increased confusion, agitation and hallucinations.  Symptoms thought to be secondary to dehydration versus delirium.  Work-up is been unremarkable.  Psychiatry was consulted and recommended Zyprexa for agitation and hallucinations.  Additionally, patient with significant aspiration risk.  Family opted to restart tube feeding.  Patient has been restarted on tube feeds via PEG tube starting on October 15.  Currently on continuous feeds to minimize risk for aspiration.   Assessment & Plan:   Principal Problem:   Altered mental status Active Problems:   COPD (chronic obstructive pulmonary disease) with emphysema (HCC)   Chronic respiratory failure (HCC)   HTN (hypertension)   Acute encephalopathy   Hypothyroidism   Protein-calorie malnutrition, severe   Acute encephalopathy, unspecified Unsure cause. MRI negative for acute stroke. Appears to be back to baseline currently.  Dysphasia Chronic issue.  Significantly worsened in setting of deconditioning.  Patient was restarted on tube feeds.  Plan for continuous tube feeding per registered dietitian recommendations.  Per discussion with dietitian, current regimen would allow for at least 30 minutes of physical activity. Patient currently high risk for refeeding syndrome and will require daily labs and frequent assessments for the next 48 hours -Jevity 55 mL/h and free water to 100 mL every 4 hours -Renal function panel and magnesium daily  Hypothyroidism -Continue Synthroid  COPD Stable -Continue Anoro Elllipta daily and Albuterol prn  Insomnia Agitation -Continue trazodone and Zyprexa.  Severe malnutrition -Tube feeding as mentioned above     DVT  prophylaxis: Lovenox Code Status:   Code Status: Full Code Family Communication: Daughter via telephone Disposition Plan: Discharge home in 48 hours   Consultants:   Critical care medicine  Psychiatry  Procedures:   None  Antimicrobials:  None    Subjective: No issues.  Objective: Vitals:   03/29/18 1948 03/29/18 2137 03/30/18 0452 03/30/18 0809  BP:  (!) 144/75 130/63   Pulse:  62 (!) 58   Resp:  15 15   Temp:  (!) 97.5 F (36.4 C) 98.2 F (36.8 C)   TempSrc:  Oral Oral   SpO2: 90% 92% 93% 91%  Weight:   60.6 kg   Height:        Intake/Output Summary (Last 24 hours) at 03/30/2018 1340 Last data filed at 03/30/2018 1019 Gross per 24 hour  Intake 30 ml  Output 400 ml  Net -370 ml   Filed Weights   03/22/18 2251 03/30/18 0452  Weight: 60.8 kg 60.6 kg    Examination:  General exam: Appears calm and comfortable Respiratory system: Clear to auscultation. Respiratory effort normal. Cardiovascular system: S1 & S2 heard, RRR. No murmurs, rubs, gallops or clicks. Gastrointestinal system: Abdomen is nondistended, soft and nontender. Normal bowel sounds heard. Peg tube site is unremarkable Central nervous system: Alert, oriented to person and place. Extremities: No edema. No calf tenderness Skin: No cyanosis. No rashes Psychiatry: Judgement and insight appear normal. Mood & affect appropriate.     Data Reviewed: I have personally reviewed following labs and imaging studies  CBC: Recent Labs  Lab 03/25/18 0347 03/30/18 0847  WBC 7.0 4.8  HGB 9.9* 11.0*  HCT 31.3* 33.3*  MCV 97.5 96.0  PLT 145* 185   Basic Metabolic Panel: Recent Labs  Lab  03/24/18 0907 03/25/18 0347 03/29/18 0424 03/30/18 0847  NA 144 143  --  139  K 3.9 4.1  --  3.9  CL 112* 113*  --  102  CO2 24 23  --  30  GLUCOSE 67* 96  --  131*  BUN 30* 32*  --  22  CREATININE 0.83 0.95 1.05 0.83  CALCIUM 8.4* 8.3*  --  8.6*  MG 2.4  --   --   --    GFR: Estimated Creatinine  Clearance: 50.7 mL/min (by C-G formula based on SCr of 0.83 mg/dL). Liver Function Tests: No results for input(s): AST, ALT, ALKPHOS, BILITOT, PROT, ALBUMIN in the last 168 hours. No results for input(s): LIPASE, AMYLASE in the last 168 hours. No results for input(s): AMMONIA in the last 168 hours. Coagulation Profile: No results for input(s): INR, PROTIME in the last 168 hours. Cardiac Enzymes: No results for input(s): CKTOTAL, CKMB, CKMBINDEX, TROPONINI in the last 168 hours. BNP (last 3 results) Recent Labs    09/23/17 1139 01/12/18 1613  PROBNP 120.0* 123.0*   HbA1C: No results for input(s): HGBA1C in the last 72 hours. CBG: Recent Labs  Lab 03/29/18 1951 03/30/18 0023 03/30/18 0448 03/30/18 0742 03/30/18 1204  GLUCAP 102* 119* 105* 121* 88   Lipid Profile: No results for input(s): CHOL, HDL, LDLCALC, TRIG, CHOLHDL, LDLDIRECT in the last 72 hours. Thyroid Function Tests: No results for input(s): TSH, T4TOTAL, FREET4, T3FREE, THYROIDAB in the last 72 hours. Anemia Panel: No results for input(s): VITAMINB12, FOLATE, FERRITIN, TIBC, IRON, RETICCTPCT in the last 72 hours. Sepsis Labs: No results for input(s): PROCALCITON, LATICACIDVEN in the last 168 hours.  No results found for this or any previous visit (from the past 240 hour(s)).       Radiology Studies: Mr Brain Wo Contrast  Result Date: 03/29/2018 CLINICAL DATA:  Encephalopathy. Altered level of consciousness. Agitation and hallucinations since Saturday. EXAM: MRI HEAD WITHOUT CONTRAST TECHNIQUE: Multiplanar, multiecho pulse sequences of the brain and surrounding structures were obtained without intravenous contrast. COMPARISON:  Head CT from 03/22/2018 and 10/06/2012 FINDINGS: Brain: No acute infarction, hemorrhage, hydrocephalus, extra-axial collection or mass lesion. Moderate atrophy and chronic small vessel ischemia in the cerebral white matter. Vascular: Major flow voids are preserved. Strong dominance of the  right vertebral artery. Skull and upper cervical spine: No evident marrow lesion. Sinuses/Orbits: Chronic left maxillary sinusitis with atelectasis. There is enophthalmos on the left, compatible with silent sinus syndrome. Bilateral cataract resection. IMPRESSION: 1. No acute finding. 2. Moderate atrophy and chronic small vessel ischemia. Electronically Signed   By: Monte Fantasia M.D.   On: 03/29/2018 08:52   Dg Chest Port 1 View  Result Date: 03/29/2018 CLINICAL DATA:  84 year old male. Aspiration. Subsequent encounter. EXAM: PORTABLE CHEST 1 VIEW COMPARISON:  03/24/2018 chest x-ray. FINDINGS: Interval improved aeration left lung base. Persistent right lung base opacity suggestive of atelectasis. Infiltrate less likely consideration. No pulmonary edema or pneumothorax. Heart size within normal limits. Calcified mildly tortuous aorta. Scoliosis thoracic spine. IMPRESSION: 1. Interval improved aeration left lung base. Persistent right lung base opacity suggestive of atelectasis. Infiltrate less likely consideration. 2.  Aortic Atherosclerosis (ICD10-I70.0). Electronically Signed   By: Genia Del M.D.   On: 03/29/2018 08:35   Dg Swallowing Func-speech Pathology  Result Date: 03/28/2018 Objective Swallowing Evaluation: Type of Study: MBS-Modified Barium Swallow Study  Patient Details Name: Babatunde Seago Sr. MRN: [id number] Date of Birth: 1925/02/04 Today's Date: 03/28/2018 Time: SLP Start Time (ACUTE ONLY): 1315 -  SLP Stop Time (ACUTE ONLY): 1357 SLP Time Calculation (min) (ACUTE ONLY): 42 min Past Medical History: Past Medical History: Diagnosis Date . Anemia  . COPD (chronic obstructive pulmonary disease) (HCC)   Mild . HTN (hypertension)  . Hypothyroidism  Past Surgical History: Past Surgical History: Procedure Laterality Date . CATARACT EXTRACTION Bilateral  . GALLBLADDER SURGERY  02/21/2011 . OTHER SURGICAL HISTORY    finger right pointer--laceration repair HPI: pt is a 84 yo male adm to [facility] with AMS -  pt had not slept for several days prior to admit per family.  Pt with PMH + for hip fx s/p surgery with post-op dysphagia requiring feeding tube, COPD, aspiration pna.   Patient has had several swallow studies completed in the past with last one recommending dys3/nectar diet.  He recently had a "touch" of pna and was treated with ABX per daughter.  Subjective: pt awake in chair Assessment / Plan / Recommendation CHL IP CLINICAL IMPRESSIONS 03/28/2018 Clinical Impression Pt presents with a moderate oral and severe pharyngeal dysphagia resulting in aspiration of thin, nectar, honey consistencies and gross residuals across all boluses.  Barium mixed with copious secretions retained in pharynx/larynx.  He is weak and has very poor laryngeal closure/epiglottic deflection/pharyngeal contraction results in gross vallecular residuals.  Aspiration occurs both during the swallow due to decreased airway closure and after as residuals spill into open airway.  Pt does NOT sense residuals period nor aspiration consistently.  Cued cough is weak and not effective to clear aspirates.  Throughout study pt was instructed to "cough" and "hock" to help clear pharynx.  This strategy decreased residuals but did not elminiate them.  Pt will aspirate regardless of consistency and thicker boluses result in worsening residuals.  He is aspirating secretions at this time. Son Eduard Clos present for exam and he, male attendant and pt were educated to findings/concerns.  Educated them again that tube feeding does NOT prevent aspiration - Again recommend palliative consult for this pt.  Recommend pt consume single small ice chips after oral care only to help with secretion management/comfort.  Pt should be encouraged to cough/hock with all intake.   If pt desires po - it should be with accepted aspiration and for comfort only as pt will be unable to meet nutritional needs by po alone due to level of dysphagia.   SLP Visit Diagnosis Dysphagia,  oropharyngeal phase (R13.12);Dysphagia, pharyngoesophageal phase (R13.14) Attention and concentration deficit following -- Frontal lobe and executive function deficit following -- Impact on safety and function Severe aspiration risk;Risk for inadequate nutrition/hydration   CHL IP TREATMENT RECOMMENDATION 03/24/2018 Treatment Recommendations Therapy as outlined in treatment plan below   Prognosis 03/28/2018 Prognosis for Safe Diet Advancement Fair Barriers to Reach Goals Severity of deficits;Time post onset Barriers/Prognosis Comment -- CHL IP DIET RECOMMENDATION 03/28/2018 SLP Diet Recommendations NPO;Ice chips PRN after oral care Liquid Administration via -- Medication Administration -- Compensations -- Postural Changes --   No flowsheet data found.  CHL IP FOLLOW UP RECOMMENDATIONS 03/28/2018 Follow up Recommendations None   CHL IP FREQUENCY AND DURATION 03/28/2018 Speech Therapy Frequency (ACUTE ONLY) min 1 x/week Treatment Duration 1 week      CHL IP ORAL PHASE 03/28/2018 Oral Phase Impaired Oral - Pudding Teaspoon -- Oral - Pudding Cup -- Oral - Honey Teaspoon Reduced posterior propulsion Oral - Honey Cup -- Oral - Nectar Teaspoon Reduced posterior propulsion Oral - Nectar Cup Reduced posterior propulsion Oral - Nectar Straw -- Oral - Thin Teaspoon Reduced posterior propulsion  Oral - Thin Cup Reduced posterior propulsion Oral - Thin Straw -- Oral - Puree Reduced posterior propulsion Oral - Mech Soft -- Oral - Regular -- Oral - Multi-Consistency -- Oral - Pill -- Oral Phase - Comment --  CHL IP PHARYNGEAL PHASE 03/28/2018 Pharyngeal Phase Impaired Pharyngeal- Pudding Teaspoon -- Pharyngeal -- Pharyngeal- Pudding Cup -- Pharyngeal -- Pharyngeal- Honey Teaspoon Reduced pharyngeal peristalsis;Reduced epiglottic inversion;Reduced laryngeal elevation;Reduced airway/laryngeal closure;Pharyngeal residue - valleculae;Reduced tongue base retraction;Penetration/Aspiration during swallow;Penetration/Apiration after  swallow;Reduced anterior laryngeal mobility Pharyngeal Material enters airway, passes BELOW cords without attempt by patient to eject out (silent aspiration) Pharyngeal- Honey Cup -- Pharyngeal -- Pharyngeal- Nectar Teaspoon Reduced pharyngeal peristalsis;Reduced epiglottic inversion;Reduced anterior laryngeal mobility;Reduced laryngeal elevation;Reduced airway/laryngeal closure;Reduced tongue base retraction;Penetration/Aspiration during swallow;Penetration/Apiration after swallow;Pharyngeal residue - valleculae;Pharyngeal residue - pyriform Pharyngeal Material enters airway, passes BELOW cords without attempt by patient to eject out (silent aspiration) Pharyngeal- Nectar Cup Reduced pharyngeal peristalsis;Reduced laryngeal elevation;Reduced epiglottic inversion;Reduced airway/laryngeal closure;Reduced tongue base retraction;Pharyngeal residue - valleculae;Pharyngeal residue - pyriform;Significant aspiration (Amount);Penetration/Aspiration during swallow;Penetration/Apiration after swallow;Reduced anterior laryngeal mobility Pharyngeal Material enters airway, passes BELOW cords without attempt by patient to eject out (silent aspiration);Material enters airway, passes BELOW cords and not ejected out despite cough attempt by patient Pharyngeal- Nectar Straw -- Pharyngeal -- Pharyngeal- Thin Teaspoon Reduced pharyngeal peristalsis;Reduced epiglottic inversion;Reduced anterior laryngeal mobility;Reduced laryngeal elevation;Reduced airway/laryngeal closure;Reduced tongue base retraction;Significant aspiration (Amount);Pharyngeal residue - pyriform;Pharyngeal residue - valleculae Pharyngeal -- Pharyngeal- Thin Cup Reduced pharyngeal peristalsis;Reduced epiglottic inversion;Reduced anterior laryngeal mobility;Reduced laryngeal elevation;Reduced airway/laryngeal closure;Reduced tongue base retraction;Penetration/Aspiration during swallow;Penetration/Apiration after swallow;Pharyngeal residue - valleculae;Pharyngeal residue  - pyriform Pharyngeal Material enters airway, passes BELOW cords without attempt by patient to eject out (silent aspiration) Pharyngeal- Thin Straw -- Pharyngeal -- Pharyngeal- Puree Reduced tongue base retraction;Reduced airway/laryngeal closure;Reduced pharyngeal peristalsis;Reduced epiglottic inversion;Reduced anterior laryngeal mobility;Reduced laryngeal elevation;Pharyngeal residue - valleculae Pharyngeal -- Pharyngeal- Mechanical Soft -- Pharyngeal -- Pharyngeal- Regular -- Pharyngeal -- Pharyngeal- Multi-consistency -- Pharyngeal -- Pharyngeal- Pill -- Pharyngeal -- Pharyngeal Comment pt without sensation to gross residuals or minimal aspiration, gross aspiration sensed but pt's reflexive cough was not strong enough to clear, head turn right did not decrease residual accumulation, chin tuck not tested due to concerns for pyriform residuals spilling more readily into airway after the swallow  CHL IP CERVICAL ESOPHAGEAL PHASE 03/28/2018 Cervical Esophageal Phase Impaired Pudding Teaspoon -- Pudding Cup -- Honey Teaspoon -- Honey Cup -- Nectar Teaspoon -- Nectar Cup -- Nectar Straw -- Thin Teaspoon -- Thin Cup -- Thin Straw -- Puree -- Mechanical Soft -- Regular -- Multi-consistency -- Pill -- Cervical Esophageal Comment decreased UES opening resulting in residuals, pt with shallow pyriform sinuses resulting in increased aspiration Macario Golds 03/28/2018, 2:28 PM Luanna Salk, MS [facility] SLP Acute Rehab Services Pager [phone] Office [phone]                   Scheduled Meds: . aspirin  325 mg Per Tube Daily  . budesonide (PULMICORT) nebulizer solution  0.25 mg Nebulization BID  . calcium-vitamin D  1 tablet Per Tube Daily  . docusate  100 mg Per Tube BID  . enoxaparin (LOVENOX) injection  40 mg Subcutaneous QHS  . feeding supplement (JEVITY 1.2 CAL)  1,000 mL Per Tube Q24H  . free water  100 mL Per Tube Q4H  . levothyroxine  100 mcg Per Tube QAC breakfast  . multivitamin with  minerals  1 tablet Per Tube Daily  . pantoprazole sodium  40 mg Per  Tube Daily  . polyethylene glycol  17 g Per Tube Daily  . traZODone  50 mg Oral QHS  . umeclidinium-vilanterol  1 puff Inhalation Daily  . verapamil  80 mg Per Tube TID   Continuous Infusions: . sodium chloride Stopped (03/27/18 2107)     LOS: 7 days     Cordelia Poche, MD [facility] 03/30/2018, 1:40 PM  If 7PM-7AM, please contact night-coverage [email or web address] 03/30/2018, 1:40 PM

## 2018-03-30 NOTE — Progress Notes (Signed)
Physical Therapy Treatment Patient Details Name: [name] Bua Sr. MRN: [id number] DOB: [birth year] Today's Date: 03/30/2018    History of Present Illness 84 y.o. male with medical history significant for COPD , hypothyroidism, hypertension, and chronic mild confusion and memory loss, R hip fracture/ Hemiarthroplasty in July/19, presenting to the ED 03/22/18 for evaluation of increased confusion, agitation, and hallucinations.      PT Comments    Son at bed side and very helpful to assist.  Assisted pt OOB to amb 70 feet, one seated rest break before amb back to room using walker.  VCs to increase step length, no loss of balance, slight B hip and knee flex posture and slow/delayed corrective responce.  HIGH FALL RISK     Follow Up Recommendations  Home health PT;Supervision/Assistance - 24 hour(has a very supportive family)     Equipment Recommendations  None recommended by PT    Recommendations for Other Services       Precautions / Restrictions Precautions Precautions: Posterior Hip;Fall Precaution Comments: right hip hemiarthroplasty 11/2017 posterior, Has a PEG from hospital stay in july; son reports pt has had 3 falls in past 1 year, none since hip fx in June 2019 Restrictions Weight Bearing Restrictions: No    Mobility  Bed Mobility Overal bed mobility: Needs Assistance Bed Mobility: Supine to Sit     Supine to sit: Supervision;Min guard     General bed mobility comments: slight assist to complete scooting to EOB   Transfers Overall transfer level: Needs assistance Equipment used: Rolling walker (2 wheeled) Transfers: Sit to/from [facility] Sit to Stand: Min assist;Min guard Stand pivot transfers: Min assist       General transfer comment: 25% VC's for safety with turns and increased time allowed   Ambulation/Gait Ambulation/Gait assistance: Min guard Gait Distance (Feet): 140 Feet(70 feet x 2 one sitting rest break  ) Assistive device:  Rolling walker (2 wheeled) Gait Pattern/deviations: Step-through pattern;Decreased stride length;Trunk flexed;Narrow base of support Gait velocity: decreased    General Gait Details: VCs to increase step length, no loss of balance, slight B hip and knee flex posture and slow/delayed corrective responce.  HIGH FALL RISK    Stairs             Wheelchair Mobility    Modified Rankin (Stroke Patients Only)       Balance                                            Cognition Arousal/Alertness: Awake/alert Behavior During Therapy: WFL for tasks assessed/performed Overall Cognitive Status: History of cognitive impairments - at baseline                                 General Comments: pleasant      Exercises      General Comments        Pertinent Vitals/Pain Pain Assessment: No/denies pain    Home Living                      Prior Function            PT Goals (current goals can now be found in the care plan section) Progress towards PT goals: Progressing toward goals    Frequency    Min 3X/week  PT Plan Current plan remains appropriate    Co-evaluation              AM-PAC PT "6 Clicks" Daily Activity  Outcome Measure  Difficulty turning over in bed (including adjusting bedclothes, sheets and blankets)?: A Lot Difficulty moving from lying on back to sitting on the side of the bed? : A Lot Difficulty sitting down on and standing up from a chair with arms (e.g., wheelchair, bedside commode, etc,.)?: A Lot Help needed moving to and from a bed to chair (including a wheelchair)?: A Little Help needed walking in hospital room?: A Little Help needed climbing 3-5 steps with a railing? : A Lot 6 Click Score: 14    End of Session Equipment Utilized During Treatment: Gait belt Activity Tolerance: Patient tolerated treatment well Patient left: in chair;with call bell/phone within reach;with family/visitor  present;with chair alarm set Nurse Communication: Mobility status PT Visit Diagnosis: Unsteadiness on feet (R26.81);Muscle weakness (generalized) (M62.81)     Time: 9480-1655 PT Time Calculation (min) (ACUTE ONLY): 24 min  Charges:  $Gait Training: 8-22 mins $Therapeutic Activity: 8-22 mins                     Rica Koyanagi  PTA Acute  Rehabilitation Services Pager      [phone] Office      [phone]

## 2018-03-30 NOTE — Care Management Note (Signed)
Case Management Note  Patient Details  Name: [name] Warmuth Sr. MRN: [id number] Date of Birth: 04/14/1925  This CM contacted daughter [name] [name] about DC planning. Plan is for pt to dc home with [facility] home health as prior to admission. Daughter informed that Dietitian is recommending continuous TF with TF pump at home. [name] [name] stating that she doesn't understand why he needs continuous feeds and needs more information. MD asked to call daughter. [facility] alerted of orders for TF and TF pump.  Lynnell Catalan, RN 03/30/2018, 10:11 AM  [phone]

## 2018-03-30 NOTE — Progress Notes (Signed)
Nutrition Follow-up  DOCUMENTATION CODES:   Severe malnutrition in context of chronic illness  INTERVENTION:   Continue Jevity 1.2 @ 55 ml/hr via PEG. Free water flushes 100 ml every 4 hours (600 ml total). Provides 1584 kcal, 73g protein and 1665 ml H2O.  NUTRITION DIAGNOSIS:   Severe Malnutrition related to chronic illness, dysphagia(aspiration) as evidenced by energy intake < or equal to 75% for > or equal to 1 month, mild fat depletion, severe muscle depletion.  Ongoing.  GOAL:   Patient will meet greater than or equal to 90% of their needs  Meeting with TF.  MONITOR:   Labs, Weight trends, TF tolerance, I & O's  ASSESSMENT:   84 y.o. male with medical history significant for COPD with chronic hypoxic respiratory failure, hypothyroidism, hypertension, and chronic mild confusion and memory loss, now presenting to the emergency department for evaluation of increased confusion, agitation, and hallucinations.    Patient in room awake with son at bedside. Pt currently receiving Jevity 1.2 @ 55 ml/hr and pt reports no issues with tolerance. Pt coughing intermittently during visit and using suction device. Pt's son requesting TF rate and instructions written for use at home. Provided for patient. Pt's son with concern if formula will be delivered today. Advised son to speak with CM about concerns.   Weight has remained stable.  Medications: OSCAL-D tablet daily, Colace liquid BID, Multivitamin with minerals daily Labs reviewed: CBGs: 88-121   Diet Order:   Diet Order            Diet NPO time specified Except for: Ice Chips  Diet effective now              EDUCATION NEEDS:   Not appropriate for education at this time  Skin:  Skin Assessment: Reviewed RN Assessment  Last BM:  10/14  Height:   Ht Readings from Last 1 Encounters:  03/22/18 5\' 6"  (1.676 m)    Weight:   Wt Readings from Last 1 Encounters:  03/30/18 60.6 kg    Ideal Body Weight:  64.5  kg  BMI:  Body mass index is 21.56 kg/m.  Estimated Nutritional Needs:   Kcal:  1550-1750  Protein:  80-90g  Fluid:  1.8L/day  Clayton Bibles, MS, RD, LDN [facility] Pager: [phone] After Hours Pager: [phone]

## 2018-03-31 LAB — RENAL FUNCTION PANEL
ALBUMIN: 2.8 g/dL — AB (ref 3.5–5.0)
Anion gap: 7 (ref 5–15)
BUN: 21 mg/dL (ref 8–23)
CALCIUM: 8.4 mg/dL — AB (ref 8.9–10.3)
CO2: 29 mmol/L (ref 22–32)
CREATININE: 0.77 mg/dL (ref 0.61–1.24)
Chloride: 103 mmol/L (ref 98–111)
GFR calc Af Amer: >60 mL/min (ref >60)
GFR calc non Af Amer: >60 mL/min (ref >60)
Glucose, Bld: 119 mg/dL — ABNORMAL HIGH (ref 70–99)
Phosphorus: 3.9 mg/dL (ref 2.5–4.6)
Potassium: 3.9 mmol/L (ref 3.5–5.1)
SODIUM: 139 mmol/L (ref 135–145)

## 2018-03-31 LAB — GLUCOSE, CAPILLARY
GLUCOSE-CAPILLARY: 108 mg/dL — AB (ref 70–99)
GLUCOSE-CAPILLARY: 122 mg/dL — AB (ref 70–99)
GLUCOSE-CAPILLARY: 98 mg/dL (ref 70–99)
Glucose-Capillary: 100 mg/dL — ABNORMAL HIGH (ref 70–99)
Glucose-Capillary: 100 mg/dL — ABNORMAL HIGH (ref 70–99)
Glucose-Capillary: 114 mg/dL — ABNORMAL HIGH (ref 70–99)
Glucose-Capillary: 120 mg/dL — ABNORMAL HIGH (ref 70–99)

## 2018-03-31 LAB — MAGNESIUM: MAGNESIUM: 2 mg/dL (ref 1.7–2.4)

## 2018-03-31 MED ORDER — TRAZODONE HCL 50 MG PO TABS: 25.0000 mg | ORAL_TABLET | Freq: Every day | ORAL | Status: DC

## 2018-03-31 MED ORDER — TRAZODONE HCL 50 MG PO TABS
50.0000 mg | ORAL_TABLET | Freq: Every day | ORAL | Status: DC
Start: 2018-03-31 — End: 2018-04-01
  Administered 2018-03-31: 50 mg via ORAL
  Filled 2018-03-31: qty 1

## 2018-03-31 NOTE — Progress Notes (Signed)
PROGRESS NOTE    [name] Schara Sr.  PFX:[id number] DOB: Sep 20, 1924 DOA: 03/22/2018 PCP: Renaldo Reel, DO   Brief Narrative: [name] Seats Sr. is a 84 y.o. male with a history of COPD, hypothyroidism, hypertension, cognitive impairment.  Patient presented secondary to increased confusion, agitation and hallucinations.  Symptoms thought to be secondary to dehydration versus delirium.  Work-up is been unremarkable.  Psychiatry was consulted and recommended Zyprexa for agitation and hallucinations.  Additionally, patient with significant aspiration risk.  Family opted to restart tube feeding.  Patient has been restarted on tube feeds via PEG tube starting on October 15.  Currently on continuous feeds to minimize risk for aspiration.   Assessment & Plan:   Principal Problem:   Altered mental status Active Problems:   COPD (chronic obstructive pulmonary disease) with emphysema (HCC)   Chronic respiratory failure (HCC)   HTN (hypertension)   Acute encephalopathy   Hypothyroidism   Protein-calorie malnutrition, severe   Acute encephalopathy, unspecified Unsure cause. MRI negative for acute stroke. Waxing/waning. Workup negative. Likely secondary to cognitive decline as suggested by MRI findings of cortical atrophy.  Dysphasia Chronic issue.  Significantly worsened in setting of deconditioning.  Patient was restarted on tube feeds.  Plan for continuous tube feeding per registered dietitian recommendations.  Per discussion with dietitian, current regimen would allow for at least 30 minutes of physical activity. Patient currently high risk for refeeding syndrome and will require daily labs and frequent assessments for the next 24 hours -Jevity 55 mL/h and free water to 100 mL every 4 hours -Renal function panel and magnesium daily  Hypothyroidism -Continue Synthroid  COPD Stable -Continue Anoro Elllipta daily and Albuterol prn  Insomnia Agitation -Continue trazodone and  Zyprexa.  Severe malnutrition -Tube feeding as mentioned above    DVT prophylaxis: Lovenox Code Status:   Code Status: Full Code Family Communication: Daughter and son at bedside Disposition Plan: Discharge home in 24-48 hours   Consultants:   Critical care medicine  Psychiatry  Procedures:   None  Antimicrobials:  None    Subjective: No concerns overnight.  Objective: Vitals:   03/30/18 2025 03/30/18 2123 03/31/18 0444 03/31/18 0757  BP: (!) 154/76  (!) 152/66   Pulse: 64  66 71  Resp: 16  16 16   Temp: 98.4 F (36.9 C)  98.2 F (36.8 C)   TempSrc: Oral  Oral   SpO2: 93% 93% 93% 92%  Weight:   60.4 kg   Height:        Intake/Output Summary (Last 24 hours) at 03/31/2018 1445 Last data filed at 03/31/2018 0900 Gross per 24 hour  Intake 0 ml  Output 850 ml  Net -850 ml   Filed Weights   03/22/18 2251 03/30/18 0452 03/31/18 0444  Weight: 60.8 kg 60.6 kg 60.4 kg    Examination:  General exam: Appears calm and comfortable Respiratory system: Clear to auscultation. Respiratory effort normal. Cardiovascular system: S1 & S2 heard, RRR. No murmurs, rubs, gallops or clicks. Gastrointestinal system: Abdomen is nondistended, soft and nontender. No organomegaly or masses felt. Normal bowel sounds heard. Central nervous system: Alert and oriented to person only. No focal neurological deficits. Extremities: No edema. No calf tenderness Skin: No cyanosis. No rashes.    Data Reviewed: I have personally reviewed following labs and imaging studies  CBC: Recent Labs  Lab 03/25/18 0347 03/30/18 0847  WBC 7.0 4.8  HGB 9.9* 11.0*  HCT 31.3* 33.3*  MCV 97.5 96.0  PLT 145* 150  Basic Metabolic Panel: Recent Labs  Lab 03/25/18 0347 03/29/18 0424 03/30/18 0847 03/31/18 0401  NA 143  --  139 139  K 4.1  --  3.9 3.9  CL 113*  --  102 103  CO2 23  --  30 29  GLUCOSE 96  --  131* 119*  BUN 32*  --  22 21  CREATININE 0.95 1.05 0.83 0.77  CALCIUM 8.3*   --  8.6* 8.4*  MG  --   --   --  2.0  PHOS  --   --   --  3.9   GFR: Estimated Creatinine Clearance: 52.4 mL/min (by C-G formula based on SCr of 0.77 mg/dL). Liver Function Tests: Recent Labs  Lab 03/31/18 0401  ALBUMIN 2.8*   No results for input(s): LIPASE, AMYLASE in the last 168 hours. No results for input(s): AMMONIA in the last 168 hours. Coagulation Profile: No results for input(s): INR, PROTIME in the last 168 hours. Cardiac Enzymes: No results for input(s): CKTOTAL, CKMB, CKMBINDEX, TROPONINI in the last 168 hours. BNP (last 3 results) Recent Labs    09/23/17 1139 01/12/18 1613  PROBNP 120.0* 123.0*   HbA1C: No results for input(s): HGBA1C in the last 72 hours. CBG: Recent Labs  Lab 03/30/18 2022 03/31/18 0009 03/31/18 0440 03/31/18 0717 03/31/18 1138  GLUCAP 106* 114* 120* 100* 108*   Lipid Profile: No results for input(s): CHOL, HDL, LDLCALC, TRIG, CHOLHDL, LDLDIRECT in the last 72 hours. Thyroid Function Tests: No results for input(s): TSH, T4TOTAL, FREET4, T3FREE, THYROIDAB in the last 72 hours. Anemia Panel: No results for input(s): VITAMINB12, FOLATE, FERRITIN, TIBC, IRON, RETICCTPCT in the last 72 hours. Sepsis Labs: No results for input(s): PROCALCITON, LATICACIDVEN in the last 168 hours.  No results found for this or any previous visit (from the past 240 hour(s)).       Radiology Studies: No results found.      Scheduled Meds: . aspirin  325 mg Per Tube Daily  . budesonide (PULMICORT) nebulizer solution  0.25 mg Nebulization BID  . calcium-vitamin D  1 tablet Per Tube Daily  . docusate  100 mg Per Tube BID  . enoxaparin (LOVENOX) injection  40 mg Subcutaneous QHS  . feeding supplement (JEVITY 1.2 CAL)  1,000 mL Per Tube Q24H  . free water  100 mL Per Tube Q4H  . levothyroxine  100 mcg Per Tube QAC breakfast  . multivitamin with minerals  1 tablet Per Tube Daily  . pantoprazole sodium  40 mg Per Tube Daily  . polyethylene glycol   17 g Per Tube Daily  . traZODone  50 mg Oral QHS  . umeclidinium-vilanterol  1 puff Inhalation Daily  . verapamil  80 mg Per Tube TID   Continuous Infusions: . sodium chloride Stopped (03/27/18 2107)     LOS: 8 days     Cordelia Poche, MD [facility] 03/31/2018, 2:45 PM  If 7PM-7AM, please contact night-coverage [email or web address] 03/31/2018, 2:45 PM

## 2018-03-31 NOTE — Plan of Care (Signed)
Pt alert, some intermittent confusion noted per pt's family.  Tube feeds running. Plan of care discussed with pt and family. RN will monitor.

## 2018-04-01 LAB — RENAL FUNCTION PANEL
ALBUMIN: 3.1 g/dL — AB (ref 3.5–5.0)
Anion gap: 8 (ref 5–15)
BUN: 20 mg/dL (ref 8–23)
CALCIUM: 8.8 mg/dL — AB (ref 8.9–10.3)
CO2: 29 mmol/L (ref 22–32)
Chloride: 100 mmol/L (ref 98–111)
Creatinine, Ser: 0.72 mg/dL (ref 0.61–1.24)
GFR calc Af Amer: >60 mL/min (ref >60)
GFR calc non Af Amer: >60 mL/min (ref >60)
GLUCOSE: 106 mg/dL — AB (ref 70–99)
PHOSPHORUS: 4.1 mg/dL (ref 2.5–4.6)
POTASSIUM: 4.1 mmol/L (ref 3.5–5.1)
SODIUM: 137 mmol/L (ref 135–145)

## 2018-04-01 LAB — GLUCOSE, CAPILLARY
Glucose-Capillary: 94 mg/dL (ref 70–99)
Glucose-Capillary: 87 mg/dL (ref 70–99)
Glucose-Capillary: 107 mg/dL — ABNORMAL HIGH (ref 70–99)
Glucose-Capillary: 71 mg/dL (ref 70–99)

## 2018-04-01 LAB — MAGNESIUM: Magnesium: 2.1 mg/dL (ref 1.7–2.4)

## 2018-04-01 MED ORDER — TRAZODONE HCL 50 MG PO TABS
50.0000 mg | ORAL_TABLET | Freq: Every day | ORAL | Status: DC
  Administered 2018-04-01: 50 mg via ORAL
  Filled 2018-04-01: qty 1

## 2018-04-01 MED ORDER — QUETIAPINE FUMARATE 25 MG PO TABS
25.0000 mg | ORAL_TABLET | Freq: Every day | ORAL | Status: DC
  Administered 2018-04-01: 25 mg via ORAL
  Filled 2018-04-01: qty 1

## 2018-04-01 NOTE — Care Management Important Message (Signed)
Important Message  Patient Details  Name: Collin Rengel Sr. MRN: [id number] Date of Birth: [birth year]   Medicare Important Message Given:  Yes    Kerin Salen 04/01/2018, 10:59 AMImportant Message  Patient Details  Name: Juanantonio Stolar Sr. MRN: [id number] Date of Birth: 05/06/1925   Medicare Important Message Given:  Yes    Kerin Salen 04/01/2018, 10:59 AM

## 2018-04-01 NOTE — Progress Notes (Signed)
PROGRESS NOTE    [name] Kinnear Sr.  XVQ:[id number] DOB: 06-28-1924 DOA: 03/22/2018 PCP: Renaldo Reel, DO   Brief Narrative: [name] Gumz Sr. is a 84 y.o. male with a history of COPD, hypothyroidism, hypertension, cognitive impairment.  Patient presented secondary to increased confusion, agitation and hallucinations.  Symptoms thought to be secondary to dehydration versus delirium.  Work-up is been unremarkable.  Psychiatry was consulted and recommended Zyprexa for agitation and hallucinations.  Additionally, patient with significant aspiration risk.  Family opted to restart tube feeding.  Patient has been restarted on tube feeds via PEG tube starting on October 15.  Currently on continuous feeds to minimize risk for aspiration.   Assessment & Plan:   Principal Problem:   Altered mental status Active Problems:   COPD (chronic obstructive pulmonary disease) with emphysema (HCC)   Chronic respiratory failure (HCC)   HTN (hypertension)   Acute encephalopathy   Hypothyroidism   Protein-calorie malnutrition, severe   Acute encephalopathy, unspecified Unsure cause. MRI negative for acute stroke. Waxing/waning. Workup negative. Likely secondary to cognitive decline as suggested by MRI findings of cortical atrophy. Agitation overnight. -Continue Trazodone -Start Seroquel qhs  Dysphasia Chronic issue.  Significantly worsened in setting of deconditioning.  Patient was restarted on tube feeds.  Plan for continuous tube feeding per registered dietitian recommendations.  Per discussion with dietitian, current regimen would allow for at least 30 minutes of physical activity. Electrolytes stable. -Jevity 55 mL/h and free water to 100 mL every 4 hours  Hypothyroidism -Continue Synthroid  COPD Stable -Continue Anoro Elllipta daily and Albuterol prn  Insomnia Agitation -Continue trazodone and Zyprexa.  Severe malnutrition -Tube feeding as mentioned above    DVT prophylaxis:  Lovenox Code Status:   Code Status: Full Code Family Communication: Daughter and son at bedside Disposition Plan: Discharge home in 24 if delirium improved   Consultants:   Critical care medicine  Psychiatry  Procedures:   None  Antimicrobials:  None    Subjective: Some agitation and delirium overnight.  Objective: Vitals:   03/31/18 1950 03/31/18 2214 04/01/18 0517 04/01/18 0726  BP:  (!) 176/84 (!) 163/87   Pulse:  70 67 63  Resp:  17 16 16   Temp:  97.8 F (36.6 C) 97.6 F (36.4 C)   TempSrc:   Oral   SpO2: 92% 98% 96% 96%  Weight:   59.3 kg   Height:        Intake/Output Summary (Last 24 hours) at 04/01/2018 1327 Last data filed at 04/01/2018 0251 Gross per 24 hour  Intake -  Output 300 ml  Net -300 ml   Filed Weights   03/30/18 0452 03/31/18 0444 04/01/18 0517  Weight: 60.6 kg 60.4 kg 59.3 kg    Examination:  General exam: Appears calm and comfortable Respiratory system: Clear to auscultation. Respiratory effort normal. Cardiovascular system: S1 & S2 heard, RRR. No murmurs, rubs, gallops or clicks. Gastrointestinal system: Abdomen is nondistended, soft and nontender. Normal bowel sounds heard. Central nervous system: Alert and oriented to person and place. Extremities: No edema. No calf tenderness Skin: No cyanosis. No rashes   Data Reviewed: I have personally reviewed following labs and imaging studies  CBC: Recent Labs  Lab 03/30/18 0847  WBC 4.8  HGB 11.0*  HCT 33.3*  MCV 96.0  PLT 093   Basic Metabolic Panel: Recent Labs  Lab 03/29/18 0424 03/30/18 0847 03/31/18 0401 04/01/18 0506  NA  --  139 139 137  K  --  3.9 3.9  4.1  CL  --  102 103 100  CO2  --  30 29 29   GLUCOSE  --  131* 119* 106*  BUN  --  22 21 20   CREATININE 1.05 0.83 0.77 0.72  CALCIUM  --  8.6* 8.4* 8.8*  MG  --   --  2.0 2.1  PHOS  --   --  3.9 4.1   GFR: Estimated Creatinine Clearance: 51.5 mL/min (by C-G formula based on SCr of 0.72 mg/dL). Liver  Function Tests: Recent Labs  Lab 03/31/18 0401 04/01/18 0506  ALBUMIN 2.8* 3.1*   No results for input(s): LIPASE, AMYLASE in the last 168 hours. No results for input(s): AMMONIA in the last 168 hours. Coagulation Profile: No results for input(s): INR, PROTIME in the last 168 hours. Cardiac Enzymes: No results for input(s): CKTOTAL, CKMB, CKMBINDEX, TROPONINI in the last 168 hours. BNP (last 3 results) Recent Labs    09/23/17 1139 01/12/18 1613  PROBNP 120.0* 123.0*   HbA1C: No results for input(s): HGBA1C in the last 72 hours. CBG: Recent Labs  Lab 03/31/18 1637 03/31/18 2016 03/31/18 2358 04/01/18 0506 04/01/18 0728  GLUCAP 98 100* 122* 94 87   Lipid Profile: No results for input(s): CHOL, HDL, LDLCALC, TRIG, CHOLHDL, LDLDIRECT in the last 72 hours. Thyroid Function Tests: No results for input(s): TSH, T4TOTAL, FREET4, T3FREE, THYROIDAB in the last 72 hours. Anemia Panel: No results for input(s): VITAMINB12, FOLATE, FERRITIN, TIBC, IRON, RETICCTPCT in the last 72 hours. Sepsis Labs: No results for input(s): PROCALCITON, LATICACIDVEN in the last 168 hours.  No results found for this or any previous visit (from the past 240 hour(s)).       Radiology Studies: No results found.      Scheduled Meds: . aspirin  325 mg Per Tube Daily  . budesonide (PULMICORT) nebulizer solution  0.25 mg Nebulization BID  . calcium-vitamin D  1 tablet Per Tube Daily  . docusate  100 mg Per Tube BID  . enoxaparin (LOVENOX) injection  40 mg Subcutaneous QHS  . feeding supplement (JEVITY 1.2 CAL)  1,000 mL Per Tube Q24H  . free water  100 mL Per Tube Q4H  . levothyroxine  100 mcg Per Tube QAC breakfast  . multivitamin with minerals  1 tablet Per Tube Daily  . pantoprazole sodium  40 mg Per Tube Daily  . polyethylene glycol  17 g Per Tube Daily  . QUEtiapine  25 mg Oral QHS  . traZODone  50 mg Oral QHS  . umeclidinium-vilanterol  1 puff Inhalation Daily  . verapamil  80 mg  Per Tube TID   Continuous Infusions: . sodium chloride Stopped (03/27/18 2107)     LOS: 9 days     Cordelia Poche, MD [facility] 04/01/2018, 1:27 PM  If 7PM-7AM, please contact night-coverage [email or web address] 04/01/2018, 1:27 PM

## 2018-04-01 NOTE — Progress Notes (Signed)
Physical Therapy Treatment Patient Details Name: [name] [name]. MRN: [id number] DOB: June 25, 1924 Today's Date: 04/01/2018    History of Present Illness 84 y.o. male with medical history significant for COPD , hypothyroidism, hypertension, and chronic mild confusion and memory loss, R hip fracture/ Hemiarthroplasty in July/19, presenting to the ED 03/22/18 for evaluation of increased confusion, agitation, and hallucinations.      PT Comments    Pt in recliner alert but sleepy/groggy and slow to respond verbally and physically.  Also required increased assist with all mobility.  General Gait Details: required increased assist and tolerated decreased distance.  Rigid throughout and shuffled gait.  Poor posture B hips and knees flex.  Posterior LOB with turning in bathroom, therapist corrected.     Follow Up Recommendations  Home health PT;Supervision/Assistance - 24 hour(pt has a very supportive family)     Equipment Recommendations  None recommended by PT    Recommendations for Other Services       Precautions / Restrictions Precautions Precautions: Posterior Hip;Fall Precaution Comments: right hip hemiarthroplasty 11/2017 posterior, Has a PEG from hospital stay in july; son reports pt has had 3 falls in past 1 year, none since hip fx in June 2019 Restrictions Weight Bearing Restrictions: No    Mobility  Bed Mobility Overal bed mobility: Needs Assistance Bed Mobility: Sit to Supine       Sit to supine: Mod assist   General bed mobility comments: required increased assist back to bed and position R sidelying with pillows for pressure relief to red sacrum  Transfers Overall transfer level: Needs assistance Equipment used: Rolling walker (2 wheeled) Transfers: Sit to/from [facility] Sit to Stand: Mod assist Stand pivot transfers: Mod assist;Max assist       General transfer comment: pt required increased assit with all mobility.  Increased rigidity and  slow reaction responce.  Poor posture of flex hips and knees.    Ambulation/Gait Ambulation/Gait assistance: Min assist;Mod assist Gait Distance (Feet): 22 Feet(11 feet x 2 to and from bathroom only) Assistive device: Rolling walker (2 wheeled) Gait Pattern/deviations: Step-through pattern;Decreased stride length;Ataxic;Shuffle;Narrow base of support Gait velocity: decreased    General Gait Details: required increased assist and tolerated decreased distance.  Rigid throughout and shuffled gait.  Poor posture B hips and knees flex.  Posterior LOB with turning in bathroom, therapist corrected.     Stairs             Wheelchair Mobility    Modified Rankin (Stroke Patients Only)       Balance                                            Cognition Arousal/Alertness: Awake/alert Behavior During Therapy: Flat affect                                   General Comments: not as perky/awake and slow to respond vs last PT session.        Exercises      General Comments        Pertinent Vitals/Pain Pain Assessment: No/denies pain    Home Living                      Prior Function  PT Goals (current goals can now be found in the care plan section) Progress towards PT goals: Progressing toward goals    Frequency    Min 3X/week      PT Plan Current plan remains appropriate    Co-evaluation              AM-PAC PT "6 Clicks" Daily Activity  Outcome Measure  Difficulty turning over in bed (including adjusting bedclothes, sheets and blankets)?: A Lot Difficulty moving from lying on back to sitting on the side of the bed? : A Lot Difficulty sitting down on and standing up from a chair with arms (e.g., wheelchair, bedside commode, etc,.)?: A Lot Help needed moving to and from a bed to chair (including a wheelchair)?: A Lot Help needed walking in hospital room?: A Lot Help needed climbing 3-5 steps with a  railing? : Total 6 Click Score: 11    End of Session Equipment Utilized During Treatment: Gait belt Activity Tolerance: Patient limited by lethargy Patient left: in bed;with bed alarm set;with call bell/phone within reach Nurse Communication: Mobility status PT Visit Diagnosis: Unsteadiness on feet (R26.81);Muscle weakness (generalized) (M62.81)     Time: 3403-5248 PT Time Calculation (min) (ACUTE ONLY): 29 min  Charges:  $Gait Training: 8-22 mins $Therapeutic Activity: 8-22 mins                     Rica Koyanagi  PTA Acute  Rehabilitation Services Pager      [phone] Office      [phone]

## 2018-04-01 NOTE — Progress Notes (Signed)
Occupational Therapy Treatment Patient Details Name: [name] Raden Sr. MRN: [id number] DOB: 13-Apr-1925 Today's Date: 04/01/2018    History of present illness 84 y.o. male with medical history significant for COPD , hypothyroidism, hypertension, and chronic mild confusion and memory loss, R hip fracture/ Hemiarthroplasty in July/19, presenting to the ED 03/22/18 for evaluation of increased confusion, agitation, and hallucinations.     OT comments  Pt progressing towards acute OT goals. Focus of session was bed mobility and simulated toilet transfer (EOB>recliner). Also discussed positioning to prevent skin breakdown.with son. Pt noted to be lethargic upon OT arrival. Somewhat more alert once EOB/OOB. Asleep at end of session, sitting up in recliner. D/c plan remains appropriate.    Follow Up Recommendations  Home health OT;Supervision/Assistance - 24 hour    Equipment Recommendations  None recommended by OT    Recommendations for Other Services      Precautions / Restrictions Precautions Precautions: Posterior Hip;Fall Precaution Comments: right hip hemiarthroplasty 11/2017 posterior, Has a PEG from hospital stay in july; son reports pt has had 3 falls in past 1 year, none since hip fx in June 2019 Restrictions Weight Bearing Restrictions: No       Mobility Bed Mobility Overal bed mobility: Needs Assistance Bed Mobility: Supine to Sit     Supine to sit: Min guard;HOB elevated     General bed mobility comments: Extra time and effort. Light assist to power up.  Transfers Overall transfer level: Needs assistance Equipment used: Rolling walker (2 wheeled) Transfers: Sit to/from [facility] Sit to Stand: Min assist;Min guard Stand pivot transfers: Min assist       General transfer comment: 25% VC's for safety with turns and increased time allowed     Balance Overall balance assessment: Needs assistance Sitting-balance support: Feet supported;Bilateral  upper extremity supported Sitting balance-Leahy Scale: Fair     Standing balance support: Bilateral upper extremity supported Standing balance-Leahy Scale: Poor Standing balance comment: relies on RW.                           ADL either performed or assessed with clinical judgement   ADL Overall ADL's : Needs assistance/impaired                         Toilet Transfer: Minimal assistance;+2 for safety/equipment;Ambulation;RW           Functional mobility during ADLs: Minimal assistance;Rolling walker(pivotal steps to recliner) General ADL Comments: Pt lethargic upon OT arrival. More alert once EOB/OOB. Bed mobility and pivotal steps to recliner this session. Discussed positioning to prevent skin breakdown     Vision       Perception     Praxis      Cognition Arousal/Alertness: Lethargic Behavior During Therapy: Flat affect Overall Cognitive Status: History of cognitive impairments - at baseline                                 General Comments: limited verbalizations, eyes closed a start and end of session. more alert once EOB/OOB. Son reports pt has not slept much in the past couple of days despite meds.        Exercises     Shoulder Instructions       General Comments      Pertinent Vitals/ Pain       Pain Assessment: Faces Faces Pain  Scale: Hurts little more Pain Location: grimacing with movement Pain Descriptors / Indicators: Grimacing Pain Intervention(s): Monitored during session;Limited activity within patient's tolerance;Repositioned  Home Living                                          Prior Functioning/Environment              Frequency  Min 2X/week        Progress Toward Goals  OT Goals(current goals can now be found in the care plan section)  Progress towards OT goals: Progressing toward goals  Acute Rehab OT Goals Patient Stated Goal: family's:  aggressive therapy to get pt  stronger OT Goal Formulation: With family Time For Goal Achievement: 04/07/18 Potential to Achieve Goals: Good ADL Goals Pt Will Perform Grooming: with min guard assist;standing Pt Will Transfer to Toilet: with min assist;bedside commode;ambulating Pt Will Perform Toileting - Clothing Manipulation and hygiene: with min guard assist;sit to/from stand Pt/caregiver will Perform Home Exercise Program: Increased strength;Both right and left upper extremity;With theraband;With Supervision  Plan Discharge plan remains appropriate    Co-evaluation                 AM-PAC PT "6 Clicks" Daily Activity     Outcome Measure   Help from another person eating meals?: A Little Help from another person taking care of personal grooming?: A Little Help from another person toileting, which includes using toliet, bedpan, or urinal?: A Lot Help from another person bathing (including washing, rinsing, drying)?: A Lot Help from another person to put on and taking off regular upper body clothing?: A Little Help from another person to put on and taking off regular lower body clothing?: A Lot 6 Click Score: 15    End of Session Equipment Utilized During Treatment: Gait belt;Rolling walker  OT Visit Diagnosis: Unsteadiness on feet (R26.81);Muscle weakness (generalized) (M62.81)   Activity Tolerance Patient limited by lethargy   Patient Left in chair;with call bell/phone within reach;with chair alarm set;with family/visitor present   Nurse Communication Mobility status;Other (comment)(positioning of BLE and RUE )        Time: 6767-2094 OT Time Calculation (min): 35 min  Charges: OT General Charges $OT Visit: 1 Visit OT Treatments $Self Care/Home Management : 23-37 mins  Tyrone Schimke, OT Acute Rehabilitation Services Pager: [phone] Office: [phone]    Hortencia Pilar 04/01/2018, 12:48 PM

## 2018-04-02 LAB — MAGNESIUM: MAGNESIUM: 2.1 mg/dL (ref 1.7–2.4)

## 2018-04-02 LAB — RENAL FUNCTION PANEL
Albumin: 2.8 g/dL — ABNORMAL LOW (ref 3.5–5.0)
Anion gap: 6 (ref 5–15)
BUN: 23 mg/dL (ref 8–23)
CALCIUM: 8.6 mg/dL — AB (ref 8.9–10.3)
CHLORIDE: 101 mmol/L (ref 98–111)
CO2: 28 mmol/L (ref 22–32)
CREATININE: 0.78 mg/dL (ref 0.61–1.24)
GFR calc non Af Amer: >60 mL/min (ref >60)
GLUCOSE: 119 mg/dL — AB (ref 70–99)
Phosphorus: 4 mg/dL (ref 2.5–4.6)
Potassium: 3.9 mmol/L (ref 3.5–5.1)
SODIUM: 135 mmol/L (ref 135–145)

## 2018-04-02 LAB — GLUCOSE, CAPILLARY
Glucose-Capillary: 71 mg/dL (ref 70–99)
Glucose-Capillary: 121 mg/dL — ABNORMAL HIGH (ref 70–99)

## 2018-04-02 MED ORDER — BISACODYL 10 MG RE SUPP: 10.0000 mg | Freq: Every day | RECTAL | 0 refills | Status: DC | PRN

## 2018-04-02 MED ORDER — JEVITY 1.2 CAL PO LIQD: 55.0000 mL/h | ORAL | 0 refills | Status: DC

## 2018-04-02 MED ORDER — QUETIAPINE FUMARATE 25 MG PO TABS: 12.5000 mg | ORAL_TABLET | Freq: Every day | ORAL | Status: DC

## 2018-04-02 MED ORDER — QUETIAPINE FUMARATE 25 MG PO TABS: 12.5000 mg | ORAL_TABLET | Freq: Every day | ORAL | 0 refills | Status: DC

## 2018-04-02 MED ORDER — FREE WATER: 100.0000 mL | Status: DC

## 2018-04-02 MED ORDER — POLYETHYLENE GLYCOL 3350 17 G PO PACK: 17.0000 g | PACK | Freq: Every day | ORAL | 0 refills | Status: DC | PRN

## 2018-04-02 MED ORDER — ASPIRIN 325 MG PO TABS: 325.0000 mg | ORAL_TABLET | Freq: Every day | ORAL | Status: DC

## 2018-04-02 MED ORDER — GUAIFENESIN 100 MG/5ML PO LIQD: 200.0000 mg | Freq: Three times a day (TID) | ORAL | 0 refills | Status: DC | PRN

## 2018-04-02 MED ORDER — TRAZODONE HCL 50 MG PO TABS: 50.0000 mg | ORAL_TABLET | Freq: Every day | ORAL | 0 refills | Status: DC

## 2018-04-02 MED ORDER — JEVITY 1.2 CAL PO LIQD: 55.0000 mL/h | ORAL | 0 refills | Status: AC

## 2018-04-02 MED ORDER — ONE-DAILY MULTI VITAMINS PO TABS: 1.0000 | ORAL_TABLET | Freq: Every day | ORAL | Status: DC

## 2018-04-02 MED ORDER — PANTOPRAZOLE SODIUM 40 MG PO PACK: 40.0000 mg | PACK | Freq: Every day | ORAL | 0 refills | Status: DC

## 2018-04-02 MED ORDER — VERAPAMIL HCL 80 MG PO TABS: 80.0000 mg | ORAL_TABLET | Freq: Three times a day (TID) | ORAL | Status: DC

## 2018-04-02 MED ORDER — LEVOTHYROXINE SODIUM 100 MCG PO TABS: 100.0000 ug | ORAL_TABLET | Freq: Every day | ORAL | Status: DC

## 2018-04-02 MED ORDER — CALCIUM-VITAMIN D 500-200 MG-UNIT PO TABS: 1.0000 | ORAL_TABLET | Freq: Every day | ORAL | Status: DC

## 2018-04-02 NOTE — Discharge Instructions (Signed)
[name] Seller Sr.,  You were admitted because of confusion. This may be secondary to underlying cognitive impairment. Workup was negative in the hospital. Medications have been prescribed which may help with sleep which may help with management of confusion. Tube feeding was also restarted because of high risk for aspiration. Please follow-up with a neurologist. Home health has been set up for you.

## 2018-04-02 NOTE — Progress Notes (Signed)
PROGRESS NOTE    [name] Landin Sr.  NAT:[id number] DOB: 1924/07/03 DOA: 03/22/2018 PCP: Renaldo Reel, DO   Brief Narrative: [name] Bayless Sr. is a 84 y.o. male with a history of COPD, hypothyroidism, hypertension, cognitive impairment.  Patient presented secondary to increased confusion, agitation and hallucinations.  Symptoms thought to be secondary to dehydration versus delirium.  Work-up is been unremarkable.  Psychiatry was consulted and recommended Zyprexa for agitation and hallucinations.  Additionally, patient with significant aspiration risk.  Family opted to restart tube feeding.  Patient has been restarted on tube feeds via PEG tube starting on October 15.  Currently on continuous feeds to minimize risk for aspiration.   Assessment & Plan:   Principal Problem:   Altered mental status Active Problems:   COPD (chronic obstructive pulmonary disease) with emphysema (HCC)   Chronic respiratory failure (HCC)   HTN (hypertension)   Acute encephalopathy   Hypothyroidism   Protein-calorie malnutrition, severe   Acute encephalopathy, unspecified Unsure cause. MRI negative for acute stroke. Waxing/waning. Workup negative. Likely secondary to cognitive decline as suggested by MRI findings of cortical atrophy. Agitation overnight. -Continue Trazodone -Continue Seroquel qhs at reduced dose (12.5 mg)  Dysphasia Chronic issue.  Significantly worsened in setting of deconditioning.  Patient was restarted on tube feeds.  Plan for continuous tube feeding per registered dietitian recommendations.  Per discussion with dietitian, current regimen would allow for at least 30 minutes of physical activity. Electrolytes stable. -Jevity 55 mL/h and free water to 100 mL every 4 hours  Hypothyroidism -Continue Synthroid  COPD Stable -Continue Anoro Elllipta daily and Albuterol prn  Insomnia Agitation -Continue trazodone and Zyprexa.  Severe malnutrition -Tube feeding as mentioned  above    DVT prophylaxis: Lovenox Code Status:   Code Status: Full Code Family Communication: Daughter and son at bedside Disposition Plan: Discharge home in 24 if delirium improved   Consultants:   Critical care medicine  Psychiatry  Procedures:   None  Antimicrobials:  None    Subjective: Agitation improved overnight, however, not at baseline today in setting of Seroquel and Trazodone in addition to insomnia for the past few days.  Objective: Vitals:   04/01/18 2055 04/02/18 0513 04/02/18 0826 04/02/18 0829  BP: 137/62 (!) 147/67    Pulse: (!) 58 (!) 56    Resp: 16 16    Temp: 98.1 F (36.7 C) 97.7 F (36.5 C)    TempSrc: Axillary Oral    SpO2: 94% 93% 92% 92%  Weight:  60.4 kg    Height:        Intake/Output Summary (Last 24 hours) at 04/02/2018 1234 Last data filed at 04/02/2018 0515 Gross per 24 hour  Intake 0 ml  Output 0 ml  Net 0 ml   Filed Weights   03/31/18 0444 04/01/18 0517 04/02/18 0513  Weight: 60.4 kg 59.3 kg 60.4 kg    Examination:  General exam: Appears calm and comfortable Respiratory system: Clear to auscultation. Respiratory effort normal. Cardiovascular system: S1 & S2 heard, RRR. No murmurs, rubs, gallops or clicks. Gastrointestinal system: Abdomen is nondistended, soft and nontender. No organomegaly or masses felt. Normal bowel sounds heard. Central nervous system: Asleep Extremities: No edema. No calf tenderness Skin: No cyanosis. No rashes   Data Reviewed: I have personally reviewed following labs and imaging studies  CBC: Recent Labs  Lab 03/30/18 0847  WBC 4.8  HGB 11.0*  HCT 33.3*  MCV 96.0  PLT 427   Basic Metabolic Panel: Recent Labs  Lab 03/29/18 0424 03/30/18 0847 03/31/18 0401 04/01/18 0506 04/02/18 0404  NA  --  139 139 137 135  K  --  3.9 3.9 4.1 3.9  CL  --  102 103 100 101  CO2  --  30 29 29 28   GLUCOSE  --  131* 119* 106* 119*  BUN  --  22 21 20 23   CREATININE 1.05 0.83 0.77 0.72 0.78   CALCIUM  --  8.6* 8.4* 8.8* 8.6*  MG  --   --  2.0 2.1 2.1  PHOS  --   --  3.9 4.1 4.0   GFR: Estimated Creatinine Clearance: 52.4 mL/min (by C-G formula based on SCr of 0.78 mg/dL). Liver Function Tests: Recent Labs  Lab 03/31/18 0401 04/01/18 0506 04/02/18 0404  ALBUMIN 2.8* 3.1* 2.8*   No results for input(s): LIPASE, AMYLASE in the last 168 hours. No results for input(s): AMMONIA in the last 168 hours. Coagulation Profile: No results for input(s): INR, PROTIME in the last 168 hours. Cardiac Enzymes: No results for input(s): CKTOTAL, CKMB, CKMBINDEX, TROPONINI in the last 168 hours. BNP (last 3 results) Recent Labs    09/23/17 1139 01/12/18 1613  PROBNP 120.0* 123.0*   HbA1C: No results for input(s): HGBA1C in the last 72 hours. CBG: Recent Labs  Lab 04/01/18 0728 04/01/18 1126 04/01/18 1622 04/02/18 0711 04/02/18 1139  GLUCAP 87 107* 71 71 121*   Lipid Profile: No results for input(s): CHOL, HDL, LDLCALC, TRIG, CHOLHDL, LDLDIRECT in the last 72 hours. Thyroid Function Tests: No results for input(s): TSH, T4TOTAL, FREET4, T3FREE, THYROIDAB in the last 72 hours. Anemia Panel: No results for input(s): VITAMINB12, FOLATE, FERRITIN, TIBC, IRON, RETICCTPCT in the last 72 hours. Sepsis Labs: No results for input(s): PROCALCITON, LATICACIDVEN in the last 168 hours.  No results found for this or any previous visit (from the past 240 hour(s)).       Radiology Studies: No results found.      Scheduled Meds: . aspirin  325 mg Per Tube Daily  . budesonide (PULMICORT) nebulizer solution  0.25 mg Nebulization BID  . calcium-vitamin D  1 tablet Per Tube Daily  . docusate  100 mg Per Tube BID  . enoxaparin (LOVENOX) injection  40 mg Subcutaneous QHS  . feeding supplement (JEVITY 1.2 CAL)  1,000 mL Per Tube Q24H  . free water  100 mL Per Tube Q4H  . levothyroxine  100 mcg Per Tube QAC breakfast  . multivitamin with minerals  1 tablet Per Tube Daily  .  pantoprazole sodium  40 mg Per Tube Daily  . polyethylene glycol  17 g Per Tube Daily  . QUEtiapine  25 mg Oral QHS  . traZODone  50 mg Oral QHS  . umeclidinium-vilanterol  1 puff Inhalation Daily  . verapamil  80 mg Per Tube TID   Continuous Infusions: . sodium chloride Stopped (03/27/18 2107)     LOS: 10 days     Cordelia Poche, MD [facility] 04/02/2018, 12:34 PM  If 7PM-7AM, please contact night-coverage [email or web address] 04/02/2018, 12:34 PM

## 2018-04-02 NOTE — Discharge Summary (Signed)
Physician Discharge Summary  [name] Cremer Sr. XAJ:[id number] DOB: 10-11-1924 DOA: 03/22/2018  PCP: Renaldo Reel, DO  Admit date: 03/22/2018 Discharge date: 04/02/2018  Admitted From: Home Disposition: Home  Recommendations for Outpatient Follow-up:  1. Follow up with PCP in 1 week 2. Follow up with neurolgoy 3. Please obtain BMP/CBC in one week 4. Please follow up on the following pending results: None  Home Health: PT, RN Equipment/Devices: Tube feed pump  Discharge Condition: Stable CODE STATUS: Full code Diet recommendation: NPO, tube feeding @ 64mL/hr   Brief/Interim Summary:  Admission HPI written by Vianne Bulls, MD   Chief Complaint: Agitation, confusion, hallucinations   HPI: [name] Seller Sr. is a 84 y.o. male with medical history significant for COPD with chronic hypoxic respiratory failure, hypothyroidism, hypertension, and chronic mild confusion and memory loss, now presenting to the emergency department for evaluation of increased confusion, agitation, and hallucinations.  Patient was in his usual state until early yesterday morning when he began yelling out, apparently confused, and reaching for things that were not there.  Since that time, he has not slept, and has continued to be agitated, confused, and hallucinating.  At baseline, he is conversant, able to feed himself, typically uses a wheelchair but is able to ambulate short distances.  He has remained in bed for the past 2 days and too confused to feed himself.  He has not complained of anything.  Has not appeared to be short of breath or in any distress.  No vomiting or diarrhea reported.  Patient has not had a bowel movement in 3 days.  He has been eating with family's encouragement.  ED Course: Upon arrival to the ED, patient is found to be afebrile, saturating adequately on his usual 2 L/min of supplemental oxygen, and with vitals otherwise stable.  Chemistry panel is notable for mild elevation  in BUN to creatinine ratio.  CBC and urinalysis are unremarkable.  No hypercarbia on ABG.  Noncontrast head CT is negative for acute findings and chest x-ray is notable for atelectasis or scarring only.  Patient has remained hemodynamically stable in the ED, in no apparent respiratory distress, but remains markedly confused with intermittent agitation and apparent hallucinations.  He will be observed for ongoing evaluation and management.    Hospital course:  Acute encephalopathy, unspecified Unsure cause. MRI negative for acute stroke. Waxing/waning. Workup negative. Likely secondary to cognitive decline as suggested by MRI findings of cortical atrophy. Agitation improved with Trazodone and Seroquel. Continued on discharge. There is likely underlying cognitive impairment contributing to patient's recurrent agitation.  Dysphasia Chronic issue.  Significantly worsened in setting of deconditioning.  Patient was restarted on tube feeds.  Plan for continuous tube feeding per registered dietitian recommendations.  Per discussion with dietitian, current regimen would allow for at least 30 minutes of physical activity. Electrolytes stable. Jevity 55 mL/h and free water to 100 mL every 4 hours  Hypothyroidism Continue Synthroid  COPD Stable. Continue inhalers.  Insomnia Agitation Continue trazodone. Started on Seroquel.  Severe malnutrition Tube feeding as mentioned above  Discharge Diagnoses:  Principal Problem:   Altered mental status Active Problems:   COPD (chronic obstructive pulmonary disease) with emphysema (HCC)   Chronic respiratory failure (HCC)   HTN (hypertension)   Acute encephalopathy   Hypothyroidism   Protein-calorie malnutrition, severe    Discharge Instructions  Discharge Instructions    Increase activity slowly   Complete by:  As directed      Allergies as  of 04/02/2018      Reactions   Codeine Nausea And Vomiting      Medication List    STOP taking  these medications   azithromycin 250 MG tablet Commonly known as:  ZITHROMAX   Iron 28 MG Tabs   omeprazole 40 MG capsule Commonly known as:  PRILOSEC     TAKE these medications   albuterol (5 MG/ML) 0.5% nebulizer solution Commonly known as:  PROVENTIL Take 0.5 mLs (2.5 mg total) by nebulization every 6 (six) hours as needed for wheezing or shortness of breath.   aspirin 325 MG tablet Place 1 tablet (325 mg total) into feeding tube daily. What changed:  how to take this   bisacodyl 10 MG suppository Commonly known as:  DULCOLAX Place 1 suppository (10 mg total) rectally daily as needed for moderate constipation.   calcium-vitamin D 500-200 MG-UNIT tablet Place 1 tablet into feeding tube daily. What changed:  how to take this   feeding supplement (JEVITY 1.2 CAL) Liqd Place 55 mL/hr into feeding tube continuous.   FLUTTER Devi Use as directed   free water Soln Place 100 mLs into feeding tube every 4 (four) hours.   guaiFENesin 100 MG/5ML liquid Commonly known as:  ROBITUSSIN Place 10 mLs (200 mg total) into feeding tube 3 (three) times daily as needed for cough. What changed:  how to take this   levothyroxine 100 MCG tablet Commonly known as:  SYNTHROID, LEVOTHROID Place 1 tablet (100 mcg total) into feeding tube daily. What changed:  how to take this   multivitamin tablet Place 1 tablet into feeding tube daily. What changed:  how to take this   pantoprazole sodium 40 mg/20 mL Pack Commonly known as:  PROTONIX Place 20 mLs (40 mg total) into feeding tube daily. Start taking on:  04/03/2018   polyethylene glycol packet Commonly known as:  MIRALAX / GLYCOLAX Place 17 g into feeding tube daily as needed for mild constipation.   QUEtiapine 25 MG tablet Commonly known as:  SEROQUEL Place 0.5 tablets (12.5 mg total) into feeding tube at bedtime.   traZODone 50 MG tablet Commonly known as:  DESYREL Place 1 tablet (50 mg total) into feeding tube at bedtime.     TRELEGY ELLIPTA 100-62.5-25 MCG/INH Aepb Generic drug:  Fluticasone-Umeclidin-Vilant INHALE 1 PUFF ONCE DAILY What changed:  See the new instructions.   verapamil 80 MG tablet Commonly known as:  CALAN Place 1 tablet (80 mg total) into feeding tube 3 (three) times daily. What changed:  how to take this            Durable Medical Equipment  (From admission, onward)         Start     Ordered   03/30/18 1006  For home use only DME Tube feeding pump  Once     03/30/18 1005   03/30/18 1005  For home use only DME Tube feeding  Once    Comments:  -Continue Jevity 1.2 @ 20 ml/hr via PEG, advance every 6 hours to goal rate of 55 ml/hr. -Recommend free water flushes of 100 ml every 4 hours (600 ml). -This regimen at goal rate provides 1584 kcal, 73g protein and 1665 ml H2O.    03/30/18 1005         Follow-up Information    Renaldo Reel, DO. Schedule an appointment as soon as possible for a visit in 1 week(s).   Specialty:  Family Medicine Why:  Hospital follow-up Contact information: [address]  [place] [address] [phone]        [facility] Follow up.   Specialty:  Neurology Contact information: [address] [place] .   Contact information: [place], [place] [address] [phone]         Allergies  Allergen Reactions  . Codeine Nausea And Vomiting    Consultations:  Critical care medicine  Psychiatry   Procedures/Studies: Dg Chest 2 View  Result Date: 03/22/2018 CLINICAL DATA:  Acute cough and weakness. EXAM: CHEST - 2 VIEW COMPARISON:  03/17/2018 and prior exams FINDINGS: This is a low volume study. Cardiomediastinal silhouette is unchanged. Mild bibasilar opacities again noted-likely atelectasis/scarring. No pleural effusion, definite airspace disease or pneumothorax.  IMPRESSION: Low volume study with mild bibasilar opacities again noted-likely atelectasis/scarring. Electronically Signed   By: Margarette Canada M.D.   On: 03/22/2018 18:13   Dg Chest 2 View  Result Date: 03/17/2018 CLINICAL DATA:  Follow up pneumonia. EXAM: CHEST - 2 VIEW COMPARISON:  Chest radiograph March 10, 2018 FINDINGS: Cardiac silhouette is normal. Mildly tortuous calcified aorta. Bandlike densities bilateral lung bases. Flattened hemidiaphragms. No pleural effusion or focal consolidation. No pneumothorax. Soft tissue planes and included osseous structures are non suspicious. Surgical clips in the included right abdomen compatible with cholecystectomy. IMPRESSION: Bibasilar atelectasis/scarring. Aortic Atherosclerosis (ICD10-I70.0). Emphysema (ICD10-J43.9). Electronically Signed   By: Elon Alas M.D.   On: 03/17/2018 15:59   Dg Chest 2 View  Result Date: 03/10/2018 CLINICAL DATA:  Cough and congestion for 3 days EXAM: CHEST - 2 VIEW COMPARISON:  December 22, 2017 FINDINGS: The heart size and mediastinal contours are stable. The aorta is tortuous. Patchy consolidation of right lung base with minimal right pleural effusion is noted. The left lung is clear. The visualized skeletal structures are unremarkable. IMPRESSION: Patchy consolidation with minimal right pleural effusion. This may be due to atelectasis but underlying pneumonia is not excluded. Electronically Signed   By: Abelardo Diesel M.D.   On: 03/10/2018 16:20   Ct Head Wo Contrast  Result Date: 03/22/2018 CLINICAL DATA:  Altered mental status. EXAM: CT HEAD WITHOUT CONTRAST TECHNIQUE: Contiguous axial images were obtained from the base of the skull through the vertex without intravenous contrast. COMPARISON:  CT scan of October 06, 2012. FINDINGS: Brain: Mild diffuse cortical atrophy is noted. Mild chronic ischemic white matter disease is noted. No mass effect or midline shift is noted. Ventricular size is within normal limits. There is no  evidence of mass lesion, hemorrhage or acute infarction. Vascular: No hyperdense vessel or unexpected calcification. Skull: Normal. Negative for fracture or focal lesion. Sinuses/Orbits: Opacification of left maxillary sinus is again noted consistent with chronic sinusitis. Other: None. IMPRESSION: Mild diffuse cortical atrophy. Mild chronic ischemic white matter disease. No acute intracranial abnormality seen. Electronically Signed   By: Marijo Conception, M.D.   On: 03/22/2018 19:54   Mr Brain Wo Contrast  Result Date: 03/29/2018 CLINICAL DATA:  Encephalopathy. Altered level of consciousness. Agitation and hallucinations since Saturday. EXAM: MRI HEAD WITHOUT CONTRAST TECHNIQUE: Multiplanar, multiecho pulse sequences of the brain and surrounding structures were obtained without intravenous contrast. COMPARISON:  Head CT from 03/22/2018 and 10/06/2012 FINDINGS: Brain: No acute infarction, hemorrhage, hydrocephalus, extra-axial collection or mass lesion. Moderate atrophy and chronic small vessel ischemia in the cerebral white matter. Vascular: Major flow voids are preserved. Strong dominance of  the right vertebral artery. Skull and upper cervical spine: No evident marrow lesion. Sinuses/Orbits: Chronic left maxillary sinusitis with atelectasis. There is enophthalmos on the left, compatible with silent sinus syndrome. Bilateral cataract resection. IMPRESSION: 1. No acute finding. 2. Moderate atrophy and chronic small vessel ischemia. Electronically Signed   By: Monte Fantasia M.D.   On: 03/29/2018 08:52   Dg Chest Port 1 View  Result Date: 03/29/2018 CLINICAL DATA:  84 year old male. Aspiration. Subsequent encounter. EXAM: PORTABLE CHEST 1 VIEW COMPARISON:  03/24/2018 chest x-ray. FINDINGS: Interval improved aeration left lung base. Persistent right lung base opacity suggestive of atelectasis. Infiltrate less likely consideration. No pulmonary edema or pneumothorax. Heart size within normal limits. Calcified  mildly tortuous aorta. Scoliosis thoracic spine. IMPRESSION: 1. Interval improved aeration left lung base. Persistent right lung base opacity suggestive of atelectasis. Infiltrate less likely consideration. 2.  Aortic Atherosclerosis (ICD10-I70.0). Electronically Signed   By: Genia Del M.D.   On: 03/29/2018 08:35   Dg Chest Port 1 View  Result Date: 03/24/2018 CLINICAL DATA:  Aspiration into airway EXAM: PORTABLE CHEST 1 VIEW COMPARISON:  03/22/2018 FINDINGS: Shallow lung inflation. There are patchy opacities at the lung bases bilaterally, RIGHT greater than LEFT and consistent with atelectasis or infiltrates. Heart size is accentuated by technique. There is tortuosity of the thoracic aorta. No pulmonary edema. IMPRESSION: Bibasilar atelectasis or infiltrates, RIGHT greater than LEFT. Electronically Signed   By: Nolon Nations M.D.   On: 03/24/2018 20:48   Dg Swallowing Func-speech Pathology  Result Date: 03/28/2018 Objective Swallowing Evaluation: Type of Study: MBS-Modified Barium Swallow Study  Patient Details Name: [name] Kimmons Sr. MRN: [id number] Date of Birth: 09-30-1924 Today's Date: 03/28/2018 Time: SLP Start Time (ACUTE ONLY): 1315 -SLP Stop Time (ACUTE ONLY): 1357 SLP Time Calculation (min) (ACUTE ONLY): 42 min Past Medical History: Past Medical History: Diagnosis Date . Anemia  . COPD (chronic obstructive pulmonary disease) (HCC)   Mild . HTN (hypertension)  . Hypothyroidism  Past Surgical History: Past Surgical History: Procedure Laterality Date . CATARACT EXTRACTION Bilateral  . GALLBLADDER SURGERY  02/21/2011 . OTHER SURGICAL HISTORY    finger right pointer--laceration repair HPI: pt is a 84 yo male adm to [facility] with AMS - pt had not slept for several days prior to admit per family.  Pt with PMH + for hip fx s/p surgery with post-op dysphagia requiring feeding tube, COPD, aspiration pna.   Patient has had several swallow studies completed in the past with last one recommending dys3/nectar  diet.  He recently had a "touch" of pna and was treated with ABX per daughter.  Subjective: pt awake in chair Assessment / Plan / Recommendation CHL IP CLINICAL IMPRESSIONS 03/28/2018 Clinical Impression Pt presents with a moderate oral and severe pharyngeal dysphagia resulting in aspiration of thin, nectar, honey consistencies and gross residuals across all boluses.  Barium mixed with copious secretions retained in pharynx/larynx.  He is weak and has very poor laryngeal closure/epiglottic deflection/pharyngeal contraction results in gross vallecular residuals.  Aspiration occurs both during the swallow due to decreased airway closure and after as residuals spill into open airway.  Pt does NOT sense residuals period nor aspiration consistently.  Cued cough is weak and not effective to clear aspirates.  Throughout study pt was instructed to "cough" and "hock" to help clear pharynx.  This strategy decreased residuals but did not elminiate them.  Pt will aspirate regardless of consistency and thicker boluses result in worsening residuals.  He is aspirating secretions at this  time. Donnie Aho present for exam and he, male attendant and pt were educated to findings/concerns.  Educated them again that tube feeding does NOT prevent aspiration - Again recommend palliative consult for this pt.  Recommend pt consume single small ice chips after oral care only to help with secretion management/comfort.  Pt should be encouraged to cough/hock with all intake.   If pt desires po - it should be with accepted aspiration and for comfort only as pt will be unable to meet nutritional needs by po alone due to level of dysphagia.   SLP Visit Diagnosis Dysphagia, oropharyngeal phase (R13.12);Dysphagia, pharyngoesophageal phase (R13.14) Attention and concentration deficit following -- Frontal lobe and executive function deficit following -- Impact on safety and function Severe aspiration risk;Risk for inadequate nutrition/hydration    CHL IP TREATMENT RECOMMENDATION 03/24/2018 Treatment Recommendations Therapy as outlined in treatment plan below   Prognosis 03/28/2018 Prognosis for Safe Diet Advancement Fair Barriers to Reach Goals Severity of deficits;Time post onset Barriers/Prognosis Comment -- CHL IP DIET RECOMMENDATION 03/28/2018 SLP Diet Recommendations NPO;Ice chips PRN after oral care Liquid Administration via -- Medication Administration -- Compensations -- Postural Changes --   No flowsheet data found.  CHL IP FOLLOW UP RECOMMENDATIONS 03/28/2018 Follow up Recommendations None   CHL IP FREQUENCY AND DURATION 03/28/2018 Speech Therapy Frequency (ACUTE ONLY) min 1 x/week Treatment Duration 1 week      CHL IP ORAL PHASE 03/28/2018 Oral Phase Impaired Oral - Pudding Teaspoon -- Oral - Pudding Cup -- Oral - Honey Teaspoon Reduced posterior propulsion Oral - Honey Cup -- Oral - Nectar Teaspoon Reduced posterior propulsion Oral - Nectar Cup Reduced posterior propulsion Oral - Nectar Straw -- Oral - Thin Teaspoon Reduced posterior propulsion Oral - Thin Cup Reduced posterior propulsion Oral - Thin Straw -- Oral - Puree Reduced posterior propulsion Oral - Mech Soft -- Oral - Regular -- Oral - Multi-Consistency -- Oral - Pill -- Oral Phase - Comment --  CHL IP PHARYNGEAL PHASE 03/28/2018 Pharyngeal Phase Impaired Pharyngeal- Pudding Teaspoon -- Pharyngeal -- Pharyngeal- Pudding Cup -- Pharyngeal -- Pharyngeal- Honey Teaspoon Reduced pharyngeal peristalsis;Reduced epiglottic inversion;Reduced laryngeal elevation;Reduced airway/laryngeal closure;Pharyngeal residue - valleculae;Reduced tongue base retraction;Penetration/Aspiration during swallow;Penetration/Apiration after swallow;Reduced anterior laryngeal mobility Pharyngeal Material enters airway, passes BELOW cords without attempt by patient to eject out (silent aspiration) Pharyngeal- Honey Cup -- Pharyngeal -- Pharyngeal- Nectar Teaspoon Reduced pharyngeal peristalsis;Reduced epiglottic  inversion;Reduced anterior laryngeal mobility;Reduced laryngeal elevation;Reduced airway/laryngeal closure;Reduced tongue base retraction;Penetration/Aspiration during swallow;Penetration/Apiration after swallow;Pharyngeal residue - valleculae;Pharyngeal residue - pyriform Pharyngeal Material enters airway, passes BELOW cords without attempt by patient to eject out (silent aspiration) Pharyngeal- Nectar Cup Reduced pharyngeal peristalsis;Reduced laryngeal elevation;Reduced epiglottic inversion;Reduced airway/laryngeal closure;Reduced tongue base retraction;Pharyngeal residue - valleculae;Pharyngeal residue - pyriform;Significant aspiration (Amount);Penetration/Aspiration during swallow;Penetration/Apiration after swallow;Reduced anterior laryngeal mobility Pharyngeal Material enters airway, passes BELOW cords without attempt by patient to eject out (silent aspiration);Material enters airway, passes BELOW cords and not ejected out despite cough attempt by patient Pharyngeal- Nectar Straw -- Pharyngeal -- Pharyngeal- Thin Teaspoon Reduced pharyngeal peristalsis;Reduced epiglottic inversion;Reduced anterior laryngeal mobility;Reduced laryngeal elevation;Reduced airway/laryngeal closure;Reduced tongue base retraction;Significant aspiration (Amount);Pharyngeal residue - pyriform;Pharyngeal residue - valleculae Pharyngeal -- Pharyngeal- Thin Cup Reduced pharyngeal peristalsis;Reduced epiglottic inversion;Reduced anterior laryngeal mobility;Reduced laryngeal elevation;Reduced airway/laryngeal closure;Reduced tongue base retraction;Penetration/Aspiration during swallow;Penetration/Apiration after swallow;Pharyngeal residue - valleculae;Pharyngeal residue - pyriform Pharyngeal Material enters airway, passes BELOW cords without attempt by patient to eject out (silent aspiration) Pharyngeal- Thin Straw -- Pharyngeal -- Pharyngeal- Puree Reduced tongue base retraction;Reduced airway/laryngeal  closure;Reduced pharyngeal  peristalsis;Reduced epiglottic inversion;Reduced anterior laryngeal mobility;Reduced laryngeal elevation;Pharyngeal residue - valleculae Pharyngeal -- Pharyngeal- Mechanical Soft -- Pharyngeal -- Pharyngeal- Regular -- Pharyngeal -- Pharyngeal- Multi-consistency -- Pharyngeal -- Pharyngeal- Pill -- Pharyngeal -- Pharyngeal Comment pt without sensation to gross residuals or minimal aspiration, gross aspiration sensed but pt's reflexive cough was not strong enough to clear, head turn right did not decrease residual accumulation, chin tuck not tested due to concerns for pyriform residuals spilling more readily into airway after the swallow  CHL IP CERVICAL ESOPHAGEAL PHASE 03/28/2018 Cervical Esophageal Phase Impaired Pudding Teaspoon -- Pudding Cup -- Honey Teaspoon -- Honey Cup -- Nectar Teaspoon -- Nectar Cup -- Nectar Straw -- Thin Teaspoon -- Thin Cup -- Thin Straw -- Puree -- Mechanical Soft -- Regular -- Multi-consistency -- Pill -- Cervical Esophageal Comment decreased UES opening resulting in residuals, pt with shallow pyriform sinuses resulting in increased aspiration Macario Golds 03/28/2018, 2:28 PM Luanna Salk, MS [facility] SLP Acute Rehab Services Pager [phone] Office [phone]                 Subjective: No issues overnight  Discharge Exam: Vitals:   04/02/18 0829 04/02/18 1251  BP:  132/66  Pulse:  60  Resp:  18  Temp:  (!) 97.5 F (36.4 C)  SpO2: 92% 94%   Vitals:   04/02/18 0513 04/02/18 0826 04/02/18 0829 04/02/18 1251  BP: (!) 147/67   132/66  Pulse: (!) 56   60  Resp: 16   18  Temp: 97.7 F (36.5 C)   (!) 97.5 F (36.4 C)  TempSrc: Oral   Oral  SpO2: 93% 92% 92% 94%  Weight: 60.4 kg     Height:        General exam: Appears calm and comfortable Respiratory system: Clear to auscultation. Respiratory effort normal. Cardiovascular system: S1 & S2 heard, RRR. No murmurs, rubs, gallops or clicks. Gastrointestinal system: Abdomen is nondistended, soft and  nontender. No organomegaly or masses felt. Normal bowel sounds heard. Central nervous system: Asleep Extremities: No edema. No calf tenderness Skin: No cyanosis. No rashes   The results of significant diagnostics from this hospitalization (including imaging, microbiology, ancillary and laboratory) are listed below for reference.     Microbiology: No results found for this or any previous visit (from the past 240 hour(s)).   Labs: BNP (last 3 results) No results for input(s): BNP in the last 8760 hours. Basic Metabolic Panel: Recent Labs  Lab 03/29/18 0424 03/30/18 0847 03/31/18 0401 04/01/18 0506 04/02/18 0404  NA  --  139 139 137 135  K  --  3.9 3.9 4.1 3.9  CL  --  102 103 100 101  CO2  --  30 29 29 28   GLUCOSE  --  131* 119* 106* 119*  BUN  --  22 21 20 23   CREATININE 1.05 0.83 0.77 0.72 0.78  CALCIUM  --  8.6* 8.4* 8.8* 8.6*  MG  --   --  2.0 2.1 2.1  PHOS  --   --  3.9 4.1 4.0   Liver Function Tests: Recent Labs  Lab 03/31/18 0401 04/01/18 0506 04/02/18 0404  ALBUMIN 2.8* 3.1* 2.8*   No results for input(s): LIPASE, AMYLASE in the last 168 hours. No results for input(s): AMMONIA in the last 168 hours. CBC: Recent Labs  Lab 03/30/18 0847  WBC 4.8  HGB 11.0*  HCT 33.3*  MCV 96.0  PLT 150   Cardiac Enzymes: No results  for input(s): CKTOTAL, CKMB, CKMBINDEX, TROPONINI in the last 168 hours. BNP: Invalid input(s): POCBNP CBG: Recent Labs  Lab 04/01/18 0728 04/01/18 1126 04/01/18 1622 04/02/18 0711 04/02/18 1139  GLUCAP 87 107* 71 71 121*   D-Dimer No results for input(s): DDIMER in the last 72 hours. Hgb A1c No results for input(s): HGBA1C in the last 72 hours. Lipid Profile No results for input(s): CHOL, HDL, LDLCALC, TRIG, CHOLHDL, LDLDIRECT in the last 72 hours. Thyroid function studies No results for input(s): TSH, T4TOTAL, T3FREE, THYROIDAB in the last 72 hours.  Invalid input(s): FREET3 Anemia work up No results for input(s):  VITAMINB12, FOLATE, FERRITIN, TIBC, IRON, RETICCTPCT in the last 72 hours. Urinalysis    Component Value Date/Time   COLORURINE YELLOW 03/22/2018 1609   APPEARANCEUR CLEAR 03/22/2018 1609   LABSPEC 1.011 03/22/2018 1609   PHURINE 7.0 03/22/2018 1609   GLUCOSEU NEGATIVE 03/22/2018 1609   GLUCOSEU NEGATIVE 03/09/2018 1210   HGBUR SMALL (A) 03/22/2018 1609   BILIRUBINUR NEGATIVE 03/22/2018 [place] 03/22/2018 1609   PROTEINUR NEGATIVE 03/22/2018 1609   UROBILINOGEN 0.2 03/09/2018 1210   NITRITE NEGATIVE 03/22/2018 1609   LEUKOCYTESUR NEGATIVE 03/22/2018 1609     SIGNED:   Cordelia Poche, MD [facility] 04/02/2018, 4:06 PM

## 2018-04-02 NOTE — Progress Notes (Signed)
Contacted [facility] to make aware of dc home today with TF, states delivery scheduled for 5-9 pm today of supplies and TF. Contacted [facility] to make aware of dc home with HH. Jonnie Finner RN CCM Case Mgmt phone [phone]

## 2018-04-07 ENCOUNTER — Telehealth: Payer: Self-pay | Admitting: Pulmonary Disease

## 2018-04-07 NOTE — Telephone Encounter (Signed)
Spoke with [name] [name] with  [name] states needing VO for electrical stimulation to help aid swallowing  She states family told her that they did this in rehab before and it helped some  Please advise thanks!

## 2018-04-07 NOTE — Telephone Encounter (Signed)
OK 

## 2018-04-07 NOTE — Telephone Encounter (Signed)
lmtcb for Santiago Glad.

## 2018-04-07 NOTE — Telephone Encounter (Signed)
Spoke with Kern Reap and notified of OK for PT  Nothing further needed

## 2018-04-08 ENCOUNTER — Other Ambulatory Visit: Payer: Self-pay

## 2018-04-08 MED ORDER — AZITHROMYCIN 250 MG PO TABS: 250.0000 mg | ORAL_TABLET | Freq: Every day | ORAL | 5 refills | Status: DC

## 2018-04-08 NOTE — Telephone Encounter (Signed)
Spoke with Santiago Glad. She has been given the verbal order. Nothing further was needed at this time.

## 2018-04-25 ENCOUNTER — Telehealth: Payer: Self-pay | Admitting: Pulmonary Disease

## 2018-04-25 NOTE — Telephone Encounter (Signed)
Spoke with Mickel Baas. Gave the verbal for PT 2x weekly for the next 4 weeks. She verbalized understanding. Nothing further needed at time of call.

## 2018-04-26 ENCOUNTER — Ambulatory Visit: Payer: Self-pay | Admitting: Family Medicine

## 2018-04-26 ENCOUNTER — Encounter: Payer: Self-pay | Admitting: Family Medicine

## 2018-04-26 ENCOUNTER — Ambulatory Visit (INDEPENDENT_AMBULATORY_CARE_PROVIDER_SITE_OTHER): Payer: Medicare HMO | Admitting: Family Medicine

## 2018-04-26 VITALS — BP 142/68 | HR 69 | Temp 98.4°F | Ht 66.0 in | Wt 139.8 lb

## 2018-04-26 DIAGNOSIS — R4182 Altered mental status, unspecified: Secondary | ICD-10-CM

## 2018-04-26 DIAGNOSIS — Z23 Encounter for immunization: Secondary | ICD-10-CM

## 2018-04-26 DIAGNOSIS — R2681 Unsteadiness on feet: Secondary | ICD-10-CM

## 2018-04-26 DIAGNOSIS — T17908D Unspecified foreign body in respiratory tract, part unspecified causing other injury, subsequent encounter: Secondary | ICD-10-CM | POA: Diagnosis not present

## 2018-04-26 DIAGNOSIS — J439 Emphysema, unspecified: Secondary | ICD-10-CM

## 2018-04-26 DIAGNOSIS — Z7189 Other specified counseling: Secondary | ICD-10-CM

## 2018-04-26 MED ORDER — VERAPAMIL HCL 80 MG PO TABS: 80.0000 mg | ORAL_TABLET | Freq: Two times a day (BID) | ORAL | 1 refills | Status: DC

## 2018-04-26 MED ORDER — QUETIAPINE FUMARATE 25 MG PO TABS: 12.5000 mg | ORAL_TABLET | Freq: Every day | ORAL | 0 refills | Status: DC

## 2018-04-26 MED ORDER — VERAPAMIL HCL 80 MG PO TABS: 80.0000 mg | ORAL_TABLET | Freq: Two times a day (BID) | ORAL | Status: DC

## 2018-04-26 NOTE — Patient Instructions (Signed)
Stop aspirin.   Flu shot today.  Consider tapering seroquel in the future but I wouldn't stop it yet.  Update me as needed.  Take care.  Glad to see you.

## 2018-04-26 NOTE — Progress Notes (Signed)
New patient.  Establishing care.    84 year old male with multiple medical problems.  He had a hip replacement in June 2019.  He required feeding tube placement.  Recent hospitalization with delirium/confusion.  He has been treated with Seroquel in the meantime.  He has not had to use trazodone.  He has apparently been sleeping and feeling better with his medications as is.  He may need to taper seroquel at some point. He is sleeping better with med.  No apparent ADE on med.    He still has G-tube placed.  We talked about aspiration risk with PO vs tube feeds.  Patient wanted to eat by mouth if possible.  Patient appears to be able to make his own informed choices at this point.  He understands that he can aspirate when he takes food by mouth but he can also aspirate with intake via G-tube.  We talked about potentially having the G-tube removed but it might make sense to leave for now just in case the patient ever needed to have use of it in the future.  It is also easier to change her G-tube then to remove, heal over, and then have another G-tube placed.  He understood that, as did family.  I will the patient and family to consider all of that in the meantime.  Family was asking about potentially stopping aspirin and this seems reasonable given the bleeding risk.  He is walking at home with a walker.  No recent falls.  Discussed.  He has a baseline cough with some sputum but he is not having to use supplemental O2 at this time.  He sees pulmonary at baseline.  Medication list reviewed.  Noted that he worked for a long time in a [employer].  He was only taking verapamil BID and that seemed control his BP. D/w pt and family.  He had lower BP with TID dosing.   Variable memory loss previously noted by patient and family.  Today he didn't know the month, but knew the year. He usually knows the month per family report.  He is not in contact with his wife, she is out of touch and she is allegedly living  with his wife's daughter.    Advance care planning discussed with patient.  If patient becomes incapacitated and unable to make his own decisions he wants his son Olden Klauer and his daughter Willia Craze equally designated to make decisions.  PMH and SH reviewed  ROS: Per HPI unless specifically indicated in ROS section   Meds, vitals, and allergies reviewed.   GEN: nad, alert and oriented, hard of hearing but pleasant in conversation HEENT: mucous membranes moist NECK: supple w/o LA, old scar noted on the left side of neck, does not appear infected. CV: rrr. PULM: No focal decrease in breath sounds, no inc wob, cough noted with occasional sputum. ABD: soft, +bs, G-tube in place with minimal irritation at the site, no sign of active infection. EXT: no edema SKIN: no acute rash

## 2018-04-29 ENCOUNTER — Telehealth: Payer: Self-pay | Admitting: Pulmonary Disease

## 2018-04-29 DIAGNOSIS — J432 Centrilobular emphysema: Secondary | ICD-10-CM

## 2018-04-29 NOTE — Telephone Encounter (Signed)
Spoke with pt's daughter, [name]. Pt is needing a bedside commode. She would like this sent to [facility]. This has been marked urgent so that this can be taken care of quickly. Nothing further was needed at this time.

## 2018-05-02 ENCOUNTER — Encounter: Payer: Self-pay | Admitting: Family Medicine

## 2018-05-02 DIAGNOSIS — Z7189 Other specified counseling: Secondary | ICD-10-CM | POA: Insufficient documentation

## 2018-05-02 NOTE — Assessment & Plan Note (Signed)
Fall cautions discussed, has family support, using walker as needed.  Continue as is.

## 2018-05-02 NOTE — Assessment & Plan Note (Signed)
With G-tube previously placed.  We talked about aspiration risk with oral feeds versus G-tube feeds.  Patient is trying to eat by mouth.  I think it makes sense to continue that for now if that is what he wants to do.  He seems to be aware of the risks and benefits either way.  If he cannot maintain sufficient caloric intake with oral feeds that he can supplement via G-tube.  He is considering if he wants to have the G-tube taken out completely.  I will defer to the patient and his family at this point.  They will update me as needed.

## 2018-05-02 NOTE — Assessment & Plan Note (Signed)
History of.  He has not needed trazodone at night.  He still taking Seroquel and his mentation and sleep has been improved in the meantime, since the previous hospitalization.  He still has some waxing and waning of memory overall, per family report.  Follow clinically for now.  No obvious adverse effect on Seroquel at this point.

## 2018-05-02 NOTE — Assessment & Plan Note (Signed)
Advance care planning discussed with patient.  If patient becomes incapacitated and unable to make his own decisions he wants his son [name] [name] and his daughter [name] [name] equally designated to make decisions.

## 2018-05-02 NOTE — Assessment & Plan Note (Signed)
Per pulmonary.  No change in meds at this point.

## 2018-05-03 ENCOUNTER — Encounter: Payer: Self-pay | Admitting: Podiatry

## 2018-05-03 ENCOUNTER — Ambulatory Visit: Payer: Medicare HMO | Admitting: Podiatry

## 2018-05-03 VITALS — BP 134/65

## 2018-05-03 DIAGNOSIS — M79675 Pain in left toe(s): Secondary | ICD-10-CM

## 2018-05-03 DIAGNOSIS — M79674 Pain in right toe(s): Secondary | ICD-10-CM | POA: Diagnosis not present

## 2018-05-03 DIAGNOSIS — R1312 Dysphagia, oropharyngeal phase: Secondary | ICD-10-CM | POA: Insufficient documentation

## 2018-05-03 DIAGNOSIS — B351 Tinea unguium: Secondary | ICD-10-CM | POA: Diagnosis not present

## 2018-05-03 NOTE — Patient Instructions (Signed)

## 2018-05-04 ENCOUNTER — Ambulatory Visit: Payer: Medicare HMO | Admitting: Pulmonary Disease

## 2018-05-04 ENCOUNTER — Encounter: Payer: Self-pay | Admitting: Pulmonary Disease

## 2018-05-04 VITALS — BP 130/58 | HR 62 | Ht 66.0 in | Wt 140.0 lb

## 2018-05-04 DIAGNOSIS — J181 Lobar pneumonia, unspecified organism: Secondary | ICD-10-CM | POA: Diagnosis not present

## 2018-05-04 DIAGNOSIS — I5189 Other ill-defined heart diseases: Secondary | ICD-10-CM | POA: Diagnosis not present

## 2018-05-04 DIAGNOSIS — J432 Centrilobular emphysema: Secondary | ICD-10-CM | POA: Diagnosis not present

## 2018-05-04 DIAGNOSIS — J189 Pneumonia, unspecified organism: Secondary | ICD-10-CM

## 2018-05-04 DIAGNOSIS — T17908D Unspecified foreign body in respiratory tract, part unspecified causing other injury, subsequent encounter: Secondary | ICD-10-CM

## 2018-05-04 NOTE — Progress Notes (Signed)
Subjective:    Patient ID: [name] Seller Sr., male    DOB: July 06, 1924, 84 y.o.   MRN: [id number]  Synopsis: former patient of Dr. Gwenette Greet with severe COPD, dysphagia with recurrent aspiration events; also with diastolic heart failure and anemia. Smoked cigarettes for many years, from age 65 to age 55 1.5 ppd  HPI Chief Complaint  Patient presents with  . Follow-up    pt c/o stable prod cough with yellow/white mucus.  denies sob, difficulty swallowing.    [name] [name] returns to clinic today for evaluation of his severe COPD.  He was hospitalized back in October and I visited him while hospitalized and unfortunately he had an aspiration event then.  He recovered from that.  He is now at home.  He is taking his azithromycin and Trelegy.  He needs a refill on his flutter valve.  He has not been using tube feedings for nutrition he is just eating regular food which is not pured at home.  He uses albuterol about twice a week.  He is gaining weight but he is not swelling.  He says he feels "pretty good" .  Past Medical History:  Diagnosis Date  . Anemia   . COPD (chronic obstructive pulmonary disease) (HCC)    Mild  . HTN (hypertension)   . Hypothyroidism       Review of Systems  Constitutional: Negative for chills, fatigue and fever.  HENT: Negative for postnasal drip, rhinorrhea and sinus pressure.   Respiratory: Negative for cough, shortness of breath and wheezing.   Cardiovascular: Positive for leg swelling. Negative for chest pain and palpitations.       Objective:   Physical Exam  Vitals:   05/04/18 1509  BP: (!) 130/58  Pulse: 62  SpO2: 94%  Weight: 140 lb (63.5 kg)  Height: 5\' 6"  (1.676 m)   RA  Gen: chronically ill appearing HENT: OP clear, TM's clear, neck supple PULM: CTA B, normal percussion CV: RRR, no mgr, trace edema GI: PEG site clean, no redness or drainage, BS+, soft, nontender Derm: no cyanosis or rash Psyche: normal mood and affect   CXR/CT  chest 2014:  Emphysematous changes with mild scarring, +dilated esophagus with A/F level.  CT angio 09/2012:  No PE, small nodule posterior LUL, GG anterior lingula ?significance, scattered areas of atx.    PFT PFT"s 08/2012:  Normal FEV1%, but FVC improved 17% with BD.  +++ airtrapping/ +FVL, no restriction, DLCO normal   Echo Echo 09/2012:  Normal LV EF, diastolic dysfxn, mildly dilated LA, RV normal.      Chest imaging: February 2019 chest x-ray images independently reviewed showing no pleural effusion or pulmonary edema, some chronic bronchitis changes noted  BMET    Component Value Date/Time   NA 135 04/02/2018 0404   K 3.9 04/02/2018 0404   CL 101 04/02/2018 0404   CO2 28 04/02/2018 0404   GLUCOSE 119 (H) 04/02/2018 0404   BUN 23 04/02/2018 0404   CREATININE 0.78 04/02/2018 0404   CREATININE 1.27 10/24/2012 1006   CALCIUM 8.6 (L) 04/02/2018 0404   GFRNONAA >60 04/02/2018 0404   GFRAA >60 04/02/2018 0404   Records from his nursing home reviewed where he has been receiving Trelegy daily, daily azithromycin and speech therapy.     Assessment & Plan:  Centrilobular emphysema ([place])  Pneumonia of right lower lobe due to infectious organism [facility])  Aspiration into airway, subsequent encounter  Diastolic dysfunction  Discussion: This has been a stable interval  for Mr. home since being discharged from the hospital.  He had an aspiration event while hospitalized but he has not had any further since then.  He and his family have decided to let him take by mouth rather than use his tube for nutrition.  They understand the risk of aspiration with this.  He is gaining weight for what its worth.  He is compliant with his Trelegy.  Plan: Severe COPD with recurrent exacerbations and Pseudomonas colonization: Continue daily azithromycin Continue Trelegy 1 puff daily Use albuterol as needed for chest tightness wheezing or shortness of breath, it is also okay to use this if there is  excessive rattling in his chest Practice good hand hygiene Stay active Use the flutter valve if chest congestion increases  Chronic diastolic heart failure: Continue to monitor for leg swelling Keep sodium intake less than 2 g a day  Dysphagia with recurrent aspiration events: I recommend slow swallowing, small bites  We will see you back in January 2020 or sooner if needed  > 50% of this 25 minute visit spent face to face   Current Outpatient Medications:  .  albuterol (PROVENTIL) (5 MG/ML) 0.5% nebulizer solution, Take 0.5 mLs (2.5 mg total) by nebulization every 6 (six) hours as needed for wheezing or shortness of breath., Disp: 90 mL, Rfl: 3 .  azithromycin (ZITHROMAX) 250 MG tablet, Take 1 tablet (250 mg total) by mouth daily., Disp: 30 tablet, Rfl: 5 .  Calcium Carb-Cholecalciferol (CALCIUM-VITAMIN D) 500-200 MG-UNIT tablet, Place 1 tablet into feeding tube daily., Disp: , Rfl:  .  cetirizine (ZYRTEC) 10 MG tablet, Take 10 mg by mouth daily., Disp: , Rfl:  .  ferrous sulfate 325 (65 FE) MG EC tablet, Take 325 mg by mouth daily with breakfast., Disp: , Rfl:  .  guaiFENesin (ROBITUSSIN) 100 MG/5ML liquid, Place 10 mLs (200 mg total) into feeding tube 3 (three) times daily as needed for cough., Disp: 120 mL, Rfl: 0 .  levothyroxine (SYNTHROID, LEVOTHROID) 100 MCG tablet, Place 1 tablet (100 mcg total) into feeding tube daily., Disp: , Rfl:  .  Multiple Vitamin (MULTIVITAMIN) tablet, Place 1 tablet into feeding tube daily., Disp: , Rfl:  .  omeprazole (PRILOSEC) 40 MG capsule, Take 40 mg by mouth daily., Disp: , Rfl:  .  polyethylene glycol (MIRALAX / GLYCOLAX) packet, Place 17 g into feeding tube daily as needed for mild constipation., Disp: 14 each, Rfl: 0 .  QUEtiapine (SEROQUEL) 25 MG tablet, Place 0.5 tablets (12.5 mg total) into feeding tube at bedtime., Disp: 45 tablet, Rfl: 0 .  Respiratory Therapy Supplies (FLUTTER) DEVI, Use as directed, Disp: 1 each, Rfl: 0 .  torsemide  (DEMADEX) 20 MG tablet, Take 20 mg by mouth daily as needed., Disp: , Rfl:  .  TRELEGY ELLIPTA 100-62.5-25 MCG/INH AEPB, INHALE 1 PUFF ONCE DAILY (Patient taking differently: Take 1 puff by mouth daily. ), Disp: 60 each, Rfl: 11 .  verapamil (CALAN) 80 MG tablet, Place 1 tablet (80 mg total) into feeding tube 2 (two) times daily., Disp: 180 tablet, Rfl: 1 .  Water For Irrigation, Sterile (FREE WATER) SOLN, Place 100 mLs into feeding tube every 4 (four) hours., Disp: , Rfl:

## 2018-05-04 NOTE — Patient Instructions (Signed)
Severe COPD with recurrent exacerbations and Pseudomonas colonization: Continue daily azithromycin Continue Trelegy 1 puff daily Use albuterol as needed for chest tightness wheezing or shortness of breath, it is also okay to use this if there is excessive rattling in his chest Practice good hand hygiene Stay active  Chronic diastolic heart failure: Continue to monitor for leg swelling Keep sodium intake less than 2 g a day  Dysphagia with recurrent aspiration events: I recommend slow swallowing, small bites  We will see you back in January 2020 or sooner if needed

## 2018-05-18 ENCOUNTER — Other Ambulatory Visit: Payer: Self-pay

## 2018-05-18 NOTE — Telephone Encounter (Signed)
[name] [name] (DPR signed) request refill on omeprazole 40 mg taking one daily to [facility]. Pt established care on 04/26/18; pt has G tube that he does not use. Please advise.

## 2018-05-19 MED ORDER — OMEPRAZOLE 40 MG PO CPDR: 40.0000 mg | DELAYED_RELEASE_CAPSULE | Freq: Every day | ORAL | 1 refills | Status: DC

## 2018-05-19 NOTE — Telephone Encounter (Signed)
Sent. Thanks.   

## 2018-05-23 ENCOUNTER — Encounter: Payer: Self-pay | Admitting: Podiatry

## 2018-05-23 NOTE — Progress Notes (Signed)
Subjective: [name] [name] Sr. presents today referred by Tonia Ghent, MD with cc of painful, discolored, thick toenails which interfere with daily activities. Patient is accompanied by his daughter.  Most symptomatic is his right great toenail which is "sticking straight up". Pain is aggravated when wearing any enclosed shoe gear. He did see a Podiatrist in [place] and was told it would eventually fall off.   Past Medical History:  Diagnosis Date  . Anemia   . COPD (chronic obstructive pulmonary disease) (HCC)    Mild  . HTN (hypertension)   . Hypothyroidism     Patient Active Problem List   Diagnosis Date Noted  . Oropharyngeal dysphagia 05/03/2018  . Advance care planning 05/02/2018  . Protein-calorie malnutrition, severe 03/29/2018  . Altered mental status   . Acute encephalopathy 03/22/2018  . Hypothyroidism 03/22/2018  . Pneumonia 03/10/2018  . Unsteady gait 01/12/2018  . HTN (hypertension) 01/12/2018  . Hyponatremia 01/12/2018  . Status post hip hemiarthroplasty 12/28/2017  . Chronic diastolic heart failure ([place]) 12/06/2017  . GERD (gastroesophageal reflux disease) 12/06/2017  . Aspiration into airway 02/17/2017  . Osteopenia 01/05/2017  . Anemia, iron deficiency 09/13/2015  . Fall 07/23/2015  . Herpes zoster 04/01/2015  . Chronic back pain 01/21/2015  . Spondylosis of lumbar region without myelopathy or radiculopathy 01/21/2015  . DDD (degenerative disc disease), lumbar 11/26/2014  . Thrombocytopenic ([place]) 09/19/2014  . Normocytic normochromic anemia 12/25/2013  . Solitary pulmonary nodule 03/08/2013  . Diastolic dysfunction 69/67/8938  . Chronic cough 08/26/2012  . COPD (chronic obstructive pulmonary disease) with emphysema ([place]) 08/12/2012  . Chronic respiratory failure ([place]) 08/12/2012  . Benign prostatic hyperplasia 03/18/2011    Past Surgical History:  Procedure Laterality Date  . CATARACT EXTRACTION Bilateral   . GALLBLADDER SURGERY  02/21/2011  .  OTHER SURGICAL HISTORY     finger right pointer--laceration repair  . TOTAL HIP ARTHROPLASTY       Current Outpatient Medications:  .  albuterol (PROVENTIL) (5 MG/ML) 0.5% nebulizer solution, Take 0.5 mLs (2.5 mg total) by nebulization every 6 (six) hours as needed for wheezing or shortness of breath., Disp: 90 mL, Rfl: 3 .  azithromycin (ZITHROMAX) 250 MG tablet, Take 1 tablet (250 mg total) by mouth daily., Disp: 30 tablet, Rfl: 5 .  Calcium Carb-Cholecalciferol (CALCIUM-VITAMIN D) 500-200 MG-UNIT tablet, Place 1 tablet into feeding tube daily., Disp: , Rfl:  .  cetirizine (ZYRTEC) 10 MG tablet, Take 10 mg by mouth daily., Disp: , Rfl:  .  ferrous sulfate 325 (65 FE) MG EC tablet, Take 325 mg by mouth daily with breakfast., Disp: , Rfl:  .  guaiFENesin (ROBITUSSIN) 100 MG/5ML liquid, Place 10 mLs (200 mg total) into feeding tube 3 (three) times daily as needed for cough., Disp: 120 mL, Rfl: 0 .  levothyroxine (SYNTHROID, LEVOTHROID) 100 MCG tablet, Place 1 tablet (100 mcg total) into feeding tube daily., Disp: , Rfl:  .  Multiple Vitamin (MULTIVITAMIN) tablet, Place 1 tablet into feeding tube daily., Disp: , Rfl:  .  polyethylene glycol (MIRALAX / GLYCOLAX) packet, Place 17 g into feeding tube daily as needed for mild constipation., Disp: 14 each, Rfl: 0 .  QUEtiapine (SEROQUEL) 25 MG tablet, Place 0.5 tablets (12.5 mg total) into feeding tube at bedtime., Disp: 45 tablet, Rfl: 0 .  Respiratory Therapy Supplies (FLUTTER) DEVI, Use as directed, Disp: 1 each, Rfl: 0 .  torsemide (DEMADEX) 20 MG tablet, Take 20 mg by mouth daily as needed., Disp: , Rfl:  .  TRELEGY ELLIPTA 100-62.5-25 MCG/INH AEPB, INHALE 1 PUFF ONCE DAILY (Patient taking differently: Take 1 puff by mouth daily. ), Disp: 60 each, Rfl: 11 .  verapamil (CALAN) 80 MG tablet, Place 1 tablet (80 mg total) into feeding tube 2 (two) times daily., Disp: 180 tablet, Rfl: 1 .  Water For Irrigation, Sterile (FREE WATER) SOLN, Place 100 mLs  into feeding tube every 4 (four) hours., Disp: , Rfl:  .  omeprazole (PRILOSEC) 40 MG capsule, Take 1 capsule (40 mg total) by mouth daily. Or by G tube if needed., Disp: 90 capsule, Rfl: 1  Allergies  Allergen Reactions  . Codeine Nausea And Vomiting    Social History   Occupational History  . Occupation: retired  Tobacco Use  . Smoking status: Former Smoker    Packs/day: 1.50    Years: 25.00    Pack years: 37.50    Types: Cigarettes    Last attempt to quit: 06/16/1971    Years since quitting: 46.9  . Smokeless tobacco: Never Used  . Tobacco comment: started at 84 years old  Substance and Sexual Activity  . Alcohol use: No  . Drug use: No  . Sexual activity: Never    Family History  Problem Relation Age of Onset  . Other Father        enlarged heart  . Lung disease Mother   . COPD Sister   . Heart disease Sister        Pacemaker  . Lung cancer Sister   . Colon cancer Neg Hx     Immunization History  Administered Date(s) Administered  . Influenza Split 02/15/2012, 02/13/2014, 05/03/2015  . Influenza, High Dose Seasonal PF 02/28/2016, 04/07/2017  . Influenza,inj,Quad PF,6+ Mos 03/08/2013, 04/26/2018  . Pneumococcal Polysaccharide-23 06/15/2010  . Tdap 10/27/2017     Review of systems: Positive Findings in bold print.  Constitutional:  chills, fatigue, fever, sweats, weight change Communication: Optometrist, sign Ecologist, hand writing, iPad/Android device Eyes: diplopia, glare,  light sensitivity, eyeglasses, blindness Ears nose mouth throat: Hard of hearing, deaf, sign language,  vertigo,   bloody nose,  rhinitis,  cold sores, snoring Cardiovascular: HTN, edema, arrhythmia, pacemaker in place, defibrillator in place,  chest pain/tightness, chronic anticoagulation, blood clot Respiratory:  difficulty breathing, denies congestion, SOB, wheezing, cough Gastrointestinal: abdominal pain, diarrhea, nausea, vomiting, reflux Genitourinary:  nocturia,  pain  on urination,  blood in urine, Foley catheter, urinary urgency Musculoskeletal: Uses mobility aid,  cramping, stiff joints, painful joints, back pain, difficulty walking, falls Skin: +changes in toenails, color change dryness, itchy skin, mole changes, or rash  Neurological: numbness, paresthesias, burning in feet, denies fainting,  seizure, change in speech. denies headaches, memory problems/poor historian, cerebral palsy Endocrine: diabetes, hypothyroidism, hyperthyroidism,  dry mouth, flushing, denies heat intolerance,  cold intolerance,  excessive thirst, denies polyuria,  nocturia Hematological:  easy bleeding,  excessive bleeding, easy bruising, enlarged lymph nodes, on long term blood thinner Allergy/immunological:  hive, frequent infections, multiple drug allergies, seasonal allergies,  Psychiatric:  anxiety, depression, mood disorder, suicidal ideations, hallucinations   Objective: Vascular Examination: Capillary refill time immediate x 10 digits Dorsalis pedis and posterior tibial pulses present 1/4 b/l No digital hair x 10 digits Skin temperature gradient WNL b/l  Dermatological Examination: Skin atrophy noted b/l  Toenails 1-5 b/l discolored, thick, dystrophic with subungual debris and pain with palpation to nailbeds due to thickness of nails. Right great toenail noted to be growing vertically with 0.5 cm elevation noted and tenderness to palpation.  No erythema, no edema, no drainage.  Musculoskeletal: Muscle strength 5/5 to all LE muscle groups  Neurological: Sensation intact with 10 gram monofilament Vibratory sensation intact.  Assessment: 1. Painful onychomycosis toenails 1-5 b/l    Plan: 1. Discussed onychomycosis and treatment options.  Literature dispensed on today. 2. Toenails 1-5 b/l were debrided in length and girth without iatrogenic bleeding. Right great toenail elevated mycotic tissue debrided without incident. 3. Patient to continue soft, supportive shoe  gear 4. Patient to report any pedal injuries to medical professional immediately. 5. Follow up 3 months. Patient/POA to call should there be a concern in the interim.

## 2018-05-25 ENCOUNTER — Telehealth: Payer: Self-pay | Admitting: Pulmonary Disease

## 2018-05-25 MED ORDER — LEVOFLOXACIN 500 MG PO TABS: 500.0000 mg | ORAL_TABLET | Freq: Every day | ORAL | 0 refills | Status: DC

## 2018-05-25 MED ORDER — PREDNISONE 10 MG PO TABS: 20.0000 mg | ORAL_TABLET | Freq: Every day | ORAL | 0 refills | Status: DC

## 2018-05-25 NOTE — Telephone Encounter (Signed)
Noted. Patients granddaughter and aware and medication sent in. Nothing further needed.

## 2018-05-25 NOTE — Telephone Encounter (Signed)
levaquin 500mg  daily x 5 days Hold azithromycin while taking Levaquin, then resume 1 day after completing levaquin Prednisone 20mg  daily x 5 days

## 2018-05-25 NOTE — Telephone Encounter (Signed)
Spoke with Tammy, she stated that patient is not feeling well. Patient is having a production with think yellow mucus x 3 days. Patient is requesting an antibiotic for this. Denies fever no chills and no sweats.    BQ please advise, thank you.

## 2018-05-26 ENCOUNTER — Other Ambulatory Visit: Payer: Self-pay | Admitting: Adult Health

## 2018-05-26 ENCOUNTER — Other Ambulatory Visit: Payer: Self-pay

## 2018-05-26 ENCOUNTER — Other Ambulatory Visit (INDEPENDENT_AMBULATORY_CARE_PROVIDER_SITE_OTHER): Payer: Medicare HMO

## 2018-05-26 DIAGNOSIS — R8290 Unspecified abnormal findings in urine: Secondary | ICD-10-CM

## 2018-05-26 NOTE — Addendum Note (Signed)
Addended by: Parke Poisson E on: 05/26/2018 05:15 PM   Modules accepted: Orders

## 2018-05-27 LAB — URINALYSIS, ROUTINE W REFLEX MICROSCOPIC
Bilirubin Urine: NEGATIVE
Ketones, ur: NEGATIVE
Leukocytes, UA: NEGATIVE
Nitrite: NEGATIVE
SPECIFIC GRAVITY, URINE: 1.015 (ref 1.000–1.030)
Total Protein, Urine: NEGATIVE
Urine Glucose: NEGATIVE
Urobilinogen, UA: 0.2 (ref 0.0–1.0)
pH: 5.5 (ref 5.0–8.0)

## 2018-05-27 LAB — URINE CULTURE
MICRO NUMBER:: 91491410
SPECIMEN QUALITY:: ADEQUATE

## 2018-05-30 ENCOUNTER — Telehealth: Payer: Self-pay

## 2018-05-30 ENCOUNTER — Other Ambulatory Visit: Payer: Self-pay

## 2018-05-30 NOTE — Telephone Encounter (Signed)
[name] [name] said Dr Damita Dunnings has never prescribed thyroid med. [name] [name] is not on DRP or POA and will have [name] [name]. Call back.

## 2018-05-30 NOTE — Telephone Encounter (Signed)
[name] [name] DPR signed request 90 day refill levothyroxine 100 cg to [facility]. Pt established care on 04/26/18. Dr Damita Dunnings has not prescribed yet and [name] [name] said either put the levothyroxine in feeding tube or crush med and give to pt in applesauce. Sharon request cb when refill done.

## 2018-05-31 MED ORDER — LEVOTHYROXINE SODIUM 100 MCG PO TABS: 100.0000 ug | ORAL_TABLET | Freq: Every day | ORAL | 3 refills | Status: DC

## 2018-05-31 NOTE — Telephone Encounter (Signed)
Sent. Thanks.   

## 2018-06-25 DIAGNOSIS — R2689 Other abnormalities of gait and mobility: Secondary | ICD-10-CM | POA: Diagnosis not present

## 2018-06-25 DIAGNOSIS — J439 Emphysema, unspecified: Secondary | ICD-10-CM | POA: Diagnosis not present

## 2018-06-27 ENCOUNTER — Telehealth: Payer: Self-pay | Admitting: Pulmonary Disease

## 2018-06-27 NOTE — Telephone Encounter (Signed)
Called and spoke to [place] ([facility]). [name] is requesting referral for GI, to discuss removal of feeding tube.   BQ please advise. Thanks.

## 2018-06-28 NOTE — Telephone Encounter (Signed)
I don't know if GI will deal with it.  If they just needed it removed then we need to figure out who put it in and get them back in to see them.

## 2018-06-28 NOTE — Telephone Encounter (Signed)
Called pt and advised message from the provider. Pt understood and verbalized understanding. Nothing further is needed.    

## 2018-07-01 ENCOUNTER — Ambulatory Visit: Payer: Self-pay | Admitting: Pulmonary Disease

## 2018-07-01 DIAGNOSIS — Z431 Encounter for attention to gastrostomy: Secondary | ICD-10-CM | POA: Diagnosis not present

## 2018-07-21 DIAGNOSIS — J449 Chronic obstructive pulmonary disease, unspecified: Secondary | ICD-10-CM | POA: Diagnosis not present

## 2018-07-23 ENCOUNTER — Other Ambulatory Visit: Payer: Self-pay | Admitting: Family Medicine

## 2018-07-25 NOTE — Telephone Encounter (Signed)
Electronic refill request. Quetiapine Last office visit:   04/26/18 Last Filled:    45 tablet 0 04/26/2018  Please advise.

## 2018-07-26 ENCOUNTER — Telehealth: Payer: Self-pay | Admitting: Pulmonary Disease

## 2018-07-26 ENCOUNTER — Encounter: Payer: Self-pay | Admitting: *Deleted

## 2018-07-26 DIAGNOSIS — R2689 Other abnormalities of gait and mobility: Secondary | ICD-10-CM | POA: Diagnosis not present

## 2018-07-26 DIAGNOSIS — J439 Emphysema, unspecified: Secondary | ICD-10-CM | POA: Diagnosis not present

## 2018-07-26 MED ORDER — FLUTTER DEVI: 0 refills | Status: AC

## 2018-07-26 NOTE — Telephone Encounter (Signed)
Prescription sent.  Reasonable schedule 30-minute follow-up visit after patient has seen pulmonary in March, when convenient.  Thanks.

## 2018-07-26 NOTE — Telephone Encounter (Signed)
Flutter valve given to patient's granddaughter Tammy with instructions on how to use.  Spoke to daughter [name] to make aware.  Nothing further needed.

## 2018-07-26 NOTE — Telephone Encounter (Signed)
Letter mailed

## 2018-08-01 ENCOUNTER — Telehealth: Payer: Self-pay | Admitting: Pulmonary Disease

## 2018-08-01 DIAGNOSIS — J432 Centrilobular emphysema: Secondary | ICD-10-CM

## 2018-08-01 DIAGNOSIS — R2681 Unsteadiness on feet: Secondary | ICD-10-CM

## 2018-08-01 DIAGNOSIS — M7989 Other specified soft tissue disorders: Secondary | ICD-10-CM

## 2018-08-01 NOTE — Telephone Encounter (Signed)
Routing back to triage the homecare company is the one who would have to check that we do not have the codes  For the DME supplies

## 2018-08-01 NOTE — Telephone Encounter (Signed)
Left message for [name] [name] at [facility].

## 2018-08-01 NOTE — Telephone Encounter (Signed)
Spoke with [name]. She is requesting a lift chair RX for the patient. She stated that the patient has used [facility] in the past but [facility] advised her that the insurance will not pay for a lift chair and she would need to fill out a reimbursement form.   BQ, are you ok with [place] writing a RX for this?   PCCs, can you all help to see if insurance will in fact pay for the chair? Thanks!

## 2018-08-02 ENCOUNTER — Ambulatory Visit: Payer: Medicare HMO | Admitting: Podiatry

## 2018-08-02 DIAGNOSIS — J449 Chronic obstructive pulmonary disease, unspecified: Secondary | ICD-10-CM | POA: Diagnosis not present

## 2018-08-02 DIAGNOSIS — R531 Weakness: Secondary | ICD-10-CM | POA: Diagnosis not present

## 2018-08-02 NOTE — Telephone Encounter (Signed)
Dr. Lake Bells are you okay with doing an order for a Lift Chair for [name] [name]. Daughter would like it printed and given to Maryann Conners if BQ is okay with this.

## 2018-08-03 ENCOUNTER — Telehealth: Payer: Self-pay | Admitting: Adult Health

## 2018-08-03 ENCOUNTER — Telehealth: Payer: Self-pay | Admitting: Pulmonary Disease

## 2018-08-03 MED ORDER — AMOXICILLIN-POT CLAVULANATE 400-57 MG/5ML PO SUSR: 11.0000 mL | Freq: Two times a day (BID) | ORAL | 0 refills | Status: AC

## 2018-08-03 NOTE — Telephone Encounter (Signed)
Rx has been printed and signed and given to Maryann Conners for patient.

## 2018-08-03 NOTE — Telephone Encounter (Signed)
Spoke with patient's granddaughter, Tammy Prod cough with thick yellow mucus, family worried about history of pneumonia Requesting abx  Discussed with TP: okay for liquid Augmentin 875mg  BID x7 days  [facility] and spoke with [facility]ist Threasa Beards >> correct Rx >> 22mL BID  Rx sent to pharmacy Patient granddaughter is aware  Nothing further needed; will sign off

## 2018-08-03 NOTE — Telephone Encounter (Signed)
[name] was checking on the status of order I left her know we were waiting on BQ he is working in the hospital. If he agrees to the order [name] would like [place] to fax it to [facility] on [phone] Then mail her the copy to the address on file.

## 2018-08-03 NOTE — Telephone Encounter (Signed)
Please see other encounter.

## 2018-08-03 NOTE — Telephone Encounter (Signed)
OK by me 

## 2018-08-04 ENCOUNTER — Ambulatory Visit: Payer: Self-pay | Admitting: Adult Health

## 2018-08-04 DIAGNOSIS — J449 Chronic obstructive pulmonary disease, unspecified: Secondary | ICD-10-CM | POA: Diagnosis not present

## 2018-08-16 ENCOUNTER — Ambulatory Visit (INDEPENDENT_AMBULATORY_CARE_PROVIDER_SITE_OTHER): Payer: Medicare Other | Admitting: Pulmonary Disease

## 2018-08-16 ENCOUNTER — Encounter: Payer: Self-pay | Admitting: Pulmonary Disease

## 2018-08-16 VITALS — BP 150/88 | HR 70 | Ht 66.0 in | Wt 140.0 lb

## 2018-08-16 DIAGNOSIS — M25562 Pain in left knee: Secondary | ICD-10-CM

## 2018-08-16 DIAGNOSIS — J432 Centrilobular emphysema: Secondary | ICD-10-CM | POA: Diagnosis not present

## 2018-08-16 DIAGNOSIS — J181 Lobar pneumonia, unspecified organism: Secondary | ICD-10-CM | POA: Diagnosis not present

## 2018-08-16 DIAGNOSIS — J189 Pneumonia, unspecified organism: Secondary | ICD-10-CM

## 2018-08-16 DIAGNOSIS — T17908D Unspecified foreign body in respiratory tract, part unspecified causing other injury, subsequent encounter: Secondary | ICD-10-CM

## 2018-08-16 NOTE — Patient Instructions (Signed)
Left knee pain: I wonder about something called a Baker's cyst We will check an ultrasound to look for this  Severe COPD: Continue Trelegy Continue daily azithromycin When you see Dr. Damita Dunnings I would like for him to obtain liver function test to monitor for toxicity from azithromycin Continue albuterol as you are doing Practice good hand hygiene Stay active  Recurrent aspiration events: Continue to follow speech therapy's recommendations with swallowing  We will see you back in 2 to 3 months or sooner if needed

## 2018-08-16 NOTE — Progress Notes (Signed)
Subjective:    Patient ID: [name] Seller Sr., male    DOB: 1924/09/10, 85 y.o.   MRN: [id number]  Synopsis: former patient of Dr. Gwenette Greet with severe COPD, dysphagia with recurrent aspiration events; also with diastolic heart failure and anemia. Smoked cigarettes for many years, from age 72 to age 46 1.5 ppd Was hospitalized several times in 2019, had repeated episodes of aspiration pneumonia.  Went to a nursing home and ended up on oxygen.  Had a feeding tube placed.  However, by early 2020 he had gained weight again, got to the point where he could consume all of his meals by mouth, and had the PEG tube removed.  He also was able to successfully wean off of oxygen.  HPI Chief Complaint  Patient presents with  . Follow-up    2 mo f/u. Feeding tube has been removed. Breathing has been doing ok.    Last week Mr. [name] [name] family called in on his behalf because he was coughing a little bit more for concern of an aspiration event.  He took Levaquin as prescribed and now he is feeling better.  They say that he does not have any breathing problems and he denies any breathing difficulty.  He does still have his chronic daily cough which has always been a problem for him.  However, he had his feeding tube removed, he is now stronger than before, off of oxygen, and has gained weight up to 140 pounds He has also been complaining of pain in his left knee, behind the knee while sitting.  When he gets up and walks around it feels better.  Past Medical History:  Diagnosis Date  . Anemia   . COPD (chronic obstructive pulmonary disease) (HCC)    Mild  . HTN (hypertension)   . Hypothyroidism       Review of Systems  Constitutional: Negative for chills, fatigue and fever.  HENT: Negative for postnasal drip, rhinorrhea and sinus pressure.   Respiratory: Negative for cough, shortness of breath and wheezing.   Cardiovascular: Positive for leg swelling. Negative for chest pain and palpitations.         Objective:   Physical Exam  Vitals:   08/16/18 1354  BP: (!) 150/88  Pulse: 70  SpO2: 98%  Weight: 140 lb (63.5 kg)  Height: 5\' 6"  (1.676 m)   RA  Gen: elderly male, well appearing HENT: OP clear, TM's clear, neck supple PULM: Coarse crackles R base B, normal percussion CV: RRR, no mgr, trace edema GI: BS+, soft, nontender Derm: no cyanosis or rash MSK: no palpable joint warmth, tenderness or mass in left knee Psyche: normal mood and affect   CXR/CT chest 2014:  Emphysematous changes with mild scarring, +dilated esophagus with A/F level.  CT angio 09/2012:  No PE, small nodule posterior LUL, GG anterior lingula ?significance, scattered areas of atx.    PFT PFT"s 08/2012:  Normal FEV1%, but FVC improved 17% with BD.  +++ airtrapping/ +FVL, no restriction, DLCO normal   Echo Echo 09/2012:  Normal LV EF, diastolic dysfxn, mildly dilated LA, RV normal.      Chest imaging: February 2019 chest x-ray images independently reviewed showing no pleural effusion or pulmonary edema, some chronic bronchitis changes noted  BMET    Component Value Date/Time   NA 135 04/02/2018 0404   K 3.9 04/02/2018 0404   CL 101 04/02/2018 0404   CO2 28 04/02/2018 0404   GLUCOSE 119 (H) 04/02/2018 0404   BUN 23  04/02/2018 0404   CREATININE 0.78 04/02/2018 0404   CREATININE 1.27 10/24/2012 1006   CALCIUM 8.6 (L) 04/02/2018 0404   GFRNONAA >60 04/02/2018 0404   GFRAA >60 04/02/2018 0404   January 2020 general surgery office visit from [facility] reviewed where his percutaneous endoscopic gastrostomy tube was removed successfully.     Assessment & Plan:  Acute pain of left knee - Plan: [place] LT LOWER EXTREM LTD SOFT TISSUE NON VASCULAR  Centrilobular emphysema (HCC)  Aspiration into airway, subsequent encounter  Pneumonia of right lower lobe due to infectious organism [facility])  Discussion: This is been a remarkably stable interval for Mr. [name] [name].  He does still have some aspiration  events from time to time but he is recovered from last week's quite well.  Plan: Left knee pain: new problem I wonder about something called a Baker's cyst We will check an ultrasound to look for this  Severe COPD: Continue Trelegy Continue daily azithromycin When you see Dr. Damita Dunnings I would like for him to obtain liver function test to monitor for toxicity from azithromycin Continue albuterol as you are doing Practice good hand hygiene Stay active  Recurrent aspiration events: Continue to follow speech therapy's recommendations with swallowing  We will see you back in 2 to 3 months or sooner if needed   Current Outpatient Medications:  .  albuterol (PROVENTIL) (5 MG/ML) 0.5% nebulizer solution, Take 0.5 mLs (2.5 mg total) by nebulization every 6 (six) hours as needed for wheezing or shortness of breath., Disp: 90 mL, Rfl: 3 .  azithromycin (ZITHROMAX) 250 MG tablet, Take 1 tablet (250 mg total) by mouth daily., Disp: 30 tablet, Rfl: 5 .  Calcium Carb-Cholecalciferol (CALCIUM-VITAMIN D) 500-200 MG-UNIT tablet, Place 1 tablet into feeding tube daily., Disp: , Rfl:  .  cetirizine (ZYRTEC) 10 MG tablet, Take 10 mg by mouth daily., Disp: , Rfl:  .  ferrous sulfate 325 (65 FE) MG EC tablet, Take 325 mg by mouth daily with breakfast., Disp: , Rfl:  .  guaiFENesin (ROBITUSSIN) 100 MG/5ML liquid, Place 10 mLs (200 mg total) into feeding tube 3 (three) times daily as needed for cough., Disp: 120 mL, Rfl: 0 .  levothyroxine (SYNTHROID, LEVOTHROID) 100 MCG tablet, Place 1 tablet (100 mcg total) into feeding tube daily., Disp: 90 tablet, Rfl: 3 .  Multiple Vitamin (MULTIVITAMIN) tablet, Place 1 tablet into feeding tube daily., Disp: , Rfl:  .  omeprazole (PRILOSEC) 40 MG capsule, Take 1 capsule (40 mg total) by mouth daily. Or by G tube if needed., Disp: 90 capsule, Rfl: 1 .  polyethylene glycol (MIRALAX / GLYCOLAX) packet, Place 17 g into feeding tube daily as needed for mild constipation., Disp:  14 each, Rfl: 0 .  QUEtiapine (SEROQUEL) 25 MG tablet, PLACE 1/2 TABLET INTO FEEDING TUBE AT BEDTIME, Disp: 45 tablet, Rfl: 0 .  Respiratory Therapy Supplies (FLUTTER) DEVI, Use as directed, Disp: 1 each, Rfl: 0 .  Respiratory Therapy Supplies (FLUTTER) DEVI, Use as directed, Disp: 1 each, Rfl: 0 .  torsemide (DEMADEX) 20 MG tablet, Take 20 mg by mouth daily as needed., Disp: , Rfl:  .  TRELEGY ELLIPTA 100-62.5-25 MCG/INH AEPB, INHALE 1 PUFF ONCE DAILY (Patient taking differently: Take 1 puff by mouth daily. ), Disp: 60 each, Rfl: 11 .  verapamil (CALAN) 80 MG tablet, Place 1 tablet (80 mg total) into feeding tube 2 (two) times daily., Disp: 180 tablet, Rfl: 1 .  Water For Irrigation, Sterile (FREE WATER) SOLN, Place 100  mLs into feeding tube every 4 (four) hours., Disp: , Rfl:

## 2018-08-17 ENCOUNTER — Other Ambulatory Visit: Payer: Self-pay | Admitting: Family Medicine

## 2018-08-17 DIAGNOSIS — E039 Hypothyroidism, unspecified: Secondary | ICD-10-CM

## 2018-08-17 DIAGNOSIS — D508 Other iron deficiency anemias: Secondary | ICD-10-CM

## 2018-08-18 ENCOUNTER — Ambulatory Visit (HOSPITAL_COMMUNITY)
Admission: RE | Admit: 2018-08-18 | Discharge: 2018-08-18 | Disposition: A | Discharge status: Home / Self Care | Payer: Medicare Other | Source: Ambulatory Visit | Attending: Pulmonary Disease | Admitting: Pulmonary Disease

## 2018-08-18 DIAGNOSIS — M25562 Pain in left knee: Secondary | ICD-10-CM | POA: Diagnosis not present

## 2018-08-18 DIAGNOSIS — M7122 Synovial cyst of popliteal space [Baker], left knee: Secondary | ICD-10-CM | POA: Diagnosis not present

## 2018-08-19 DIAGNOSIS — J449 Chronic obstructive pulmonary disease, unspecified: Secondary | ICD-10-CM | POA: Diagnosis not present

## 2018-08-23 ENCOUNTER — Ambulatory Visit (INDEPENDENT_AMBULATORY_CARE_PROVIDER_SITE_OTHER): Payer: Medicare Other | Admitting: Family Medicine

## 2018-08-23 ENCOUNTER — Encounter: Payer: Self-pay | Admitting: Family Medicine

## 2018-08-23 DIAGNOSIS — D508 Other iron deficiency anemias: Secondary | ICD-10-CM

## 2018-08-23 DIAGNOSIS — Z87828 Personal history of other (healed) physical injury and trauma: Secondary | ICD-10-CM

## 2018-08-23 DIAGNOSIS — E039 Hypothyroidism, unspecified: Secondary | ICD-10-CM | POA: Diagnosis not present

## 2018-08-23 DIAGNOSIS — T17908D Unspecified foreign body in respiratory tract, part unspecified causing other injury, subsequent encounter: Secondary | ICD-10-CM | POA: Diagnosis not present

## 2018-08-23 DIAGNOSIS — J439 Emphysema, unspecified: Secondary | ICD-10-CM

## 2018-08-23 DIAGNOSIS — M25562 Pain in left knee: Secondary | ICD-10-CM

## 2018-08-23 MED ORDER — QUETIAPINE FUMARATE 25 MG PO TABS: ORAL_TABLET | ORAL | Status: DC

## 2018-08-23 MED ORDER — VERAPAMIL HCL 80 MG PO TABS: 80.0000 mg | ORAL_TABLET | Freq: Two times a day (BID) | ORAL | Status: DC

## 2018-08-23 MED ORDER — POLYETHYLENE GLYCOL 3350 17 G PO PACK: 17.0000 g | PACK | Freq: Every day | ORAL | Status: AC | PRN

## 2018-08-23 MED ORDER — FLUTICASONE-UMECLIDIN-VILANT 100-62.5-25 MCG/INH IN AEPB: 1.0000 | INHALATION_SPRAY | Freq: Every day | RESPIRATORY_TRACT | Status: DC

## 2018-08-23 MED ORDER — ONE-DAILY MULTI VITAMINS PO TABS: 1.0000 | ORAL_TABLET | Freq: Every day | ORAL | Status: DC

## 2018-08-23 MED ORDER — OMEPRAZOLE 40 MG PO CPDR: 40.0000 mg | DELAYED_RELEASE_CAPSULE | Freq: Every day | ORAL | Status: DC

## 2018-08-23 MED ORDER — CALCIUM-VITAMIN D 500-200 MG-UNIT PO TABS: 1.0000 | ORAL_TABLET | Freq: Every day | ORAL | Status: AC

## 2018-08-23 MED ORDER — GUAIFENESIN 100 MG/5ML PO LIQD: 200.0000 mg | Freq: Three times a day (TID) | ORAL | Status: DC | PRN

## 2018-08-23 MED ORDER — MUPIROCIN CALCIUM 2 % EX CREA: 1.0000 "application " | TOPICAL_CREAM | Freq: Two times a day (BID) | CUTANEOUS | 0 refills | Status: DC

## 2018-08-23 NOTE — Progress Notes (Signed)
G tube out. Swallowing well.  He is eating well overall.  Cautions with swallowing d/w pt, re: aspiration.  He wanted the tube out to see how he did in the meantime.  If he had to have it placed again, he would consent at this point- but not needed now.  Discussed with patient and family.  L knee pain.  U/S with likely baker's cyst.  More pain if prolonged sitting.  Better with movement.  No pain now.  D/w pt about using ice as needed.    Pulmonary status discussed with patient, recently seen in pulmonary clinic.  On zmax daily.  Still with some sputum, greyish.  No fevers.  Some occ wheeze.    Taking levothyroxine, discussed taking prior to food.  Due for f/u labs.  No neck mass.    Still on iron.  Some occ constipation, tx'd with miralax- used if no BM that day.    He had a recurrent cut on L jaw, from shaving and picking at it.  It is slowly healing with bactroban.    PMH and SH reviewed  ROS: Per HPI unless specifically indicated in ROS section   Meds, vitals, and allergies reviewed.   GEN: nad, alert and oriented HEENT: mucous membranes moist, he has an old superficial laceration that looks like it has been slowly healing along the left angle of the jaw.  No spreading erythema.  No fluctuant mass.  Not bleeding. NECK: supple w/o LA CV: rrr. PULM: Scattered rhonchi but no increased work of breathing and no wheeze. ABD: soft, +bs, G-tube out and site well-healed.  Not tender. EXT: no edema SKIN: no acute rash

## 2018-08-23 NOTE — Patient Instructions (Signed)
Take the levothyroxine early in the AM, before breakfast.  Go to the lab on the way out.  We'll contact you with your lab report. Take care.  Glad to see you.  Update me as needed.

## 2018-08-24 ENCOUNTER — Encounter: Payer: Self-pay | Admitting: Family Medicine

## 2018-08-24 DIAGNOSIS — J9611 Chronic respiratory failure with hypoxia: Secondary | ICD-10-CM | POA: Diagnosis not present

## 2018-08-24 DIAGNOSIS — J439 Emphysema, unspecified: Secondary | ICD-10-CM | POA: Diagnosis not present

## 2018-08-24 DIAGNOSIS — Z87828 Personal history of other (healed) physical injury and trauma: Secondary | ICD-10-CM | POA: Insufficient documentation

## 2018-08-24 DIAGNOSIS — I5189 Other ill-defined heart diseases: Secondary | ICD-10-CM | POA: Diagnosis not present

## 2018-08-24 DIAGNOSIS — M25562 Pain in left knee: Secondary | ICD-10-CM | POA: Insufficient documentation

## 2018-08-24 DIAGNOSIS — J449 Chronic obstructive pulmonary disease, unspecified: Secondary | ICD-10-CM | POA: Diagnosis not present

## 2018-08-24 LAB — COMPREHENSIVE METABOLIC PANEL
ALT: 7 U/L (ref 0–53)
AST: 12 U/L (ref 0–37)
Albumin: 3.5 g/dL (ref 3.5–5.2)
Alkaline Phosphatase: 61 U/L (ref 39–117)
BUN: 22 mg/dL (ref 6–23)
CO2: 27 mEq/L (ref 19–32)
Calcium: 8.7 mg/dL (ref 8.4–10.5)
Chloride: 104 mEq/L (ref 96–112)
Creatinine, Ser: 1.01 mg/dL (ref 0.40–1.50)
GFR: 69.21 mL/min (ref >60.00)
Glucose, Bld: 105 mg/dL — ABNORMAL HIGH (ref 70–99)
Potassium: 4.4 mEq/L (ref 3.5–5.1)
Sodium: 137 mEq/L (ref 135–145)
Total Bilirubin: 0.4 mg/dL (ref 0.2–1.2)
Total Protein: 6.6 g/dL (ref 6.0–8.3)

## 2018-08-24 LAB — CBC WITH DIFFERENTIAL/PLATELET
Basophils Absolute: 0.1 10*3/uL (ref 0.0–0.1)
Basophils Relative: 1.1 % (ref 0.0–3.0)
EOS PCT: 4.2 % (ref 0.0–5.0)
Eosinophils Absolute: 0.3 10*3/uL (ref 0.0–0.7)
HCT: 35.9 % — ABNORMAL LOW (ref 39.0–52.0)
Hemoglobin: 12.1 g/dL — ABNORMAL LOW (ref 13.0–17.0)
Lymphocytes Relative: 23.7 % (ref 12.0–46.0)
Lymphs Abs: 1.5 10*3/uL (ref 0.7–4.0)
MCHC: 33.8 g/dL (ref 30.0–36.0)
MCV: 93.7 fl (ref 78.0–100.0)
Monocytes Absolute: 0.4 10*3/uL (ref 0.1–1.0)
Monocytes Relative: 5.6 % (ref 3.0–12.0)
Neutro Abs: 4.3 10*3/uL (ref 1.4–7.7)
Neutrophils Relative %: 65.4 % (ref 43.0–77.0)
Platelets: 139 10*3/uL — ABNORMAL LOW (ref 150.0–400.0)
RBC: 3.83 Mil/uL — ABNORMAL LOW (ref 4.22–5.81)
RDW: 13.8 % (ref 11.5–15.5)
WBC: 6.5 10*3/uL (ref 4.0–10.5)

## 2018-08-24 LAB — IRON: Iron: 72 ug/dL (ref 42–165)

## 2018-08-24 LAB — TSH: TSH: 2.14 u[IU]/mL (ref 0.35–4.50)

## 2018-08-24 NOTE — Assessment & Plan Note (Signed)
Reasonable to recheck labs today.  See notes on labs.  Has been on iron daily.  Able to manage occasional constipation.  No other adverse effect on medication.

## 2018-08-24 NOTE — Assessment & Plan Note (Addendum)
History of aspiration.  He is swallowing better in the meantime.  G-tube is out.  He is doing well with meals.  Routine cautions discussed.  We talked about the fact that he could aspirate with or without a G-tube, as any patient theoretically could.  >25 minutes spent in face to face time with patient, >50% spent in counselling or coordination of care.

## 2018-08-24 NOTE — Assessment & Plan Note (Signed)
This does not look chronically infected.  I think it was just slow to heal because he kept irritating the area by picking at it and attempting to shave.  It is improved some with Bactroban.  Continue use in the meantime.  Update me as needed.  It does not have nodular changes or ulceration that would be suggestive of skin cancer.

## 2018-08-24 NOTE — Assessment & Plan Note (Signed)
No change in meds at this point other than taking in the morning before taking breakfast.  See notes on labs.  No thyromegaly on exam.

## 2018-08-24 NOTE — Assessment & Plan Note (Signed)
Check routine labs today given that he is on daily Zithromax.  He does appear improved from previous.  Discussed routine use of his daily medications.

## 2018-08-24 NOTE — Assessment & Plan Note (Signed)
Highly likely that he has underlying arthritis that could cause a Baker's cyst.  Discussed.  Try to walk as tolerated as that may help with pain and use ice as needed.  Update me as needed.  He agrees.

## 2018-09-02 DIAGNOSIS — J449 Chronic obstructive pulmonary disease, unspecified: Secondary | ICD-10-CM | POA: Diagnosis not present

## 2018-09-19 DIAGNOSIS — J449 Chronic obstructive pulmonary disease, unspecified: Secondary | ICD-10-CM | POA: Diagnosis not present

## 2018-09-24 DIAGNOSIS — J449 Chronic obstructive pulmonary disease, unspecified: Secondary | ICD-10-CM | POA: Diagnosis not present

## 2018-09-24 DIAGNOSIS — I5189 Other ill-defined heart diseases: Secondary | ICD-10-CM | POA: Diagnosis not present

## 2018-09-24 DIAGNOSIS — J439 Emphysema, unspecified: Secondary | ICD-10-CM | POA: Diagnosis not present

## 2018-09-24 DIAGNOSIS — J9611 Chronic respiratory failure with hypoxia: Secondary | ICD-10-CM | POA: Diagnosis not present

## 2018-09-26 ENCOUNTER — Other Ambulatory Visit: Payer: Self-pay | Admitting: Pulmonary Disease

## 2018-10-03 DIAGNOSIS — J449 Chronic obstructive pulmonary disease, unspecified: Secondary | ICD-10-CM | POA: Diagnosis not present

## 2018-10-07 ENCOUNTER — Telehealth: Payer: Self-pay | Admitting: *Deleted

## 2018-10-07 DIAGNOSIS — R2681 Unsteadiness on feet: Secondary | ICD-10-CM

## 2018-10-07 NOTE — Telephone Encounter (Signed)
Patient's daughter [name] left a voicemail requesting an order from Dr. Damita Dunnings for a wheelchair. [name] stated that he had beenusing one that was borrowed and they have asked for it back. [name] stated that they have been using [employer] and would like the order to go thru them so insurance will pay for it.

## 2018-10-10 NOTE — Telephone Encounter (Signed)
Letter done. Thanks.

## 2018-10-11 NOTE — Telephone Encounter (Signed)
Spoke with daughter, [name]. Patient has not had a wheelchair before and this would be new order. Advised daughter that for [facility] [employer] to cover this and order it patient may need a visit specifically to this even though he already has hospital bed through this company. I was not sure exactly and advised daughter I would contact with representatives for DME through this company to find out how to proceed for sure and will call her back. Sent [employer] message to [employer], [employer], and [employer] with [employer]. Sending to Dr. Damita Dunnings as an Juluis Rainier

## 2018-10-12 NOTE — Telephone Encounter (Signed)
Per Levada Dy with [facility] this is what is needed: In order for [place] to process the order and bill his insurance we need the below.  (1) order for lightweight wheelchair with anti-tippers, brake extensions and seat cushion.  (2) The notes also must show-  Patient requires a lightweight wheelchair due to (enter diagnosis).  . Patient has a mobility limitation that prevents them from completing ADL's.  . Patient is unable to safely ambulate with an assistive device (rolling walker, cane or crutch).  . The patient can safely self-propel a wheelchair but the patient cannot safely operate a wheelchair that weighs > 36 pounds; OR the patient has a caregiver available at all times, who is able and willing to assist in propelling wheelchair.   Levada Dy said "Thank you and just let me know when all has been entered in epic and I can pull it. "

## 2018-10-13 NOTE — Progress Notes (Signed)
Patient requires a lightweight wheelchair due to gait instability.  R26.81 Patient does have a mobility limitation that prevents them from completing ADL's.  gait instability.  R26.81 Patient is unable to safely ambulate with an assistive device (rolling walker, cane or crutch). gait instability.  R26.81 The patient can safely self-propel a wheelchair but the patient cannot safely operate a wheelchair that weighs >36 pounds.  The patient also has a caregiver available at all times, who is able and willing to assist in propelling wheelchair.

## 2018-10-13 NOTE — Telephone Encounter (Signed)
Levada Dy received this order and will work on this.

## 2018-10-13 NOTE — Telephone Encounter (Signed)
DME order put in. Sent note to [place] about order and note. Awaiting reply. Spoke with Larene [place], patient's daughter advising her of the process so far.

## 2018-10-13 NOTE — Telephone Encounter (Signed)
Please sent order for lightweight wheelchair with anti-tippers, brake extensions and seat cushion.  Addendum to 08/23/2018 note: Patient requires a lightweight wheelchair due to gait instability.  R26.81 Patient does have a mobility limitation that prevents them from completing ADL's.  gait instability.  R26.81 Patient is unable to safely ambulate with an assistive device (rolling walker, cane or crutch). gait instability.  R26.81 The patient can safely self-propel a wheelchair but the patient cannot safely operate a wheelchair that weighs > 36 pounds.  The patient also has a caregiver available at all times, who is able and willing to assist in propelling wheelchair.   Let me know if this will not work.  Thanks.

## 2018-10-17 ENCOUNTER — Ambulatory Visit (INDEPENDENT_AMBULATORY_CARE_PROVIDER_SITE_OTHER): Payer: Medicare Other | Admitting: Family Medicine

## 2018-10-17 ENCOUNTER — Other Ambulatory Visit: Payer: Self-pay

## 2018-10-17 ENCOUNTER — Encounter: Payer: Self-pay | Admitting: Family Medicine

## 2018-10-17 ENCOUNTER — Other Ambulatory Visit: Payer: Self-pay | Admitting: Family Medicine

## 2018-10-17 DIAGNOSIS — L98429 Non-pressure chronic ulcer of back with unspecified severity: Secondary | ICD-10-CM | POA: Diagnosis not present

## 2018-10-17 DIAGNOSIS — G9340 Encephalopathy, unspecified: Secondary | ICD-10-CM | POA: Diagnosis not present

## 2018-10-17 DIAGNOSIS — E43 Unspecified severe protein-calorie malnutrition: Secondary | ICD-10-CM | POA: Diagnosis not present

## 2018-10-17 DIAGNOSIS — R410 Disorientation, unspecified: Secondary | ICD-10-CM | POA: Diagnosis not present

## 2018-10-17 NOTE — Assessment & Plan Note (Signed)
Doesn't sound like the area is infected.  Will set up home health.  Family can start using hydrocolloid dressing.  I'll put a sample at front for pick up.  Family will come to clinic to get it. They can also buy OTC.  Continue off loading and rotation with pillow.  Inc protein with diet/supplement.  Update me as needed.  All agreed on plan.

## 2018-10-17 NOTE — Progress Notes (Signed)
Interactive audio and video telecommunications were attempted between this provider and patient, however failed, due to patient having technical difficulties OR patient did not have access to video capability.  We continued and completed visit with audio only.   Virtual Visit via Telephone Note  I connected with patient on 10/17/18 at 2:21 by telephone and verified that I am speaking with the correct person using two identifiers.  Location of patient: home.   Location of MD: [facility] [name] of referring provider (if blank then none associated): Names per persons and role in encounter:  MD: Earlyne Iba, Patient: [name] [name]. Family also present at home.     I discussed the limitations, risks, security and privacy concerns of performing an evaluation and management service by telephone and the availability of in person appointments. I also discussed with the patient that there may be a patient responsible charge related to this service. The patient expressed understanding and agreed to proceed.  History of Present Illness:  Skin lesion present for about 1 month.  Was prev closed but opened in the last week.  1x0.5 inch lesion.  Family had used xeroform on the area.  On the L sacral area.  Family thought it was a pressure ulcer.  Family has been dressing the area and rotating pressure with pillows. No spreading redness or pus draining.  No fevers.  Is locally tender, more in the last week.  Hasn't had home health recently.    Family has wedge pillow to use and is rotating that.  He is still continent.  Protein intake, d/w family.  He "eats good.'  Discussed supplementing protein.  Already on MVI.    Patient and family agreed to home health.   meds and allergies reviewed.    Observations/Objective:nad Speech at baseline.   Assessment and Plan:sacral ulcer.   Doesn't sound like the area is infected.  Will set up home health.  Family can start using hydrocolloid dressing.  I'll  put a sample at front for pick up.  Family will come to clinic to get it. They can also buy OTC.  Continue off loading and rotation with pillow.  Inc protein with diet/supplement.  Update me as needed.  All agreed on plan.   Follow Up Instructions:see [name].    I discussed the assessment and treatment plan with the patient. The patient was provided an opportunity to ask questions and all were answered. The patient agreed with the plan and demonstrated an understanding of the instructions.   The patient was advised to call back or seek an in-person evaluation if the symptoms worsen or if the condition fails to improve as anticipated.  I provided 15 minutes of non-face-to-face time during this encounter.  Elsie Stain, MD

## 2018-10-18 ENCOUNTER — Telehealth: Payer: Self-pay | Admitting: Pulmonary Disease

## 2018-10-18 DIAGNOSIS — J432 Centrilobular emphysema: Secondary | ICD-10-CM | POA: Diagnosis not present

## 2018-10-18 DIAGNOSIS — J9610 Chronic respiratory failure, unspecified whether with hypoxia or hypercapnia: Secondary | ICD-10-CM | POA: Diagnosis not present

## 2018-10-18 DIAGNOSIS — L89322 Pressure ulcer of left buttock, stage 2: Secondary | ICD-10-CM | POA: Diagnosis not present

## 2018-10-18 DIAGNOSIS — G8929 Other chronic pain: Secondary | ICD-10-CM | POA: Diagnosis not present

## 2018-10-18 DIAGNOSIS — Z87891 Personal history of nicotine dependence: Secondary | ICD-10-CM | POA: Diagnosis not present

## 2018-10-18 DIAGNOSIS — M47816 Spondylosis without myelopathy or radiculopathy, lumbar region: Secondary | ICD-10-CM | POA: Diagnosis not present

## 2018-10-18 DIAGNOSIS — K219 Gastro-esophageal reflux disease without esophagitis: Secondary | ICD-10-CM | POA: Diagnosis not present

## 2018-10-18 DIAGNOSIS — J449 Chronic obstructive pulmonary disease, unspecified: Secondary | ICD-10-CM | POA: Diagnosis not present

## 2018-10-18 DIAGNOSIS — I5032 Chronic diastolic (congestive) heart failure: Secondary | ICD-10-CM | POA: Diagnosis not present

## 2018-10-18 DIAGNOSIS — E871 Hypo-osmolality and hyponatremia: Secondary | ICD-10-CM | POA: Diagnosis not present

## 2018-10-18 DIAGNOSIS — Z9181 History of falling: Secondary | ICD-10-CM | POA: Diagnosis not present

## 2018-10-18 DIAGNOSIS — I110 Hypertensive heart disease with heart failure: Secondary | ICD-10-CM | POA: Diagnosis not present

## 2018-10-18 DIAGNOSIS — Z96649 Presence of unspecified artificial hip joint: Secondary | ICD-10-CM | POA: Diagnosis not present

## 2018-10-18 DIAGNOSIS — E039 Hypothyroidism, unspecified: Secondary | ICD-10-CM | POA: Diagnosis not present

## 2018-10-18 DIAGNOSIS — M25562 Pain in left knee: Secondary | ICD-10-CM | POA: Diagnosis not present

## 2018-10-18 DIAGNOSIS — E43 Unspecified severe protein-calorie malnutrition: Secondary | ICD-10-CM | POA: Diagnosis not present

## 2018-10-18 DIAGNOSIS — M549 Dorsalgia, unspecified: Secondary | ICD-10-CM | POA: Diagnosis not present

## 2018-10-18 DIAGNOSIS — D508 Other iron deficiency anemias: Secondary | ICD-10-CM | POA: Diagnosis not present

## 2018-10-18 DIAGNOSIS — R1310 Dysphagia, unspecified: Secondary | ICD-10-CM | POA: Diagnosis not present

## 2018-10-18 MED ORDER — AMOXICILLIN-POT CLAVULANATE 250-62.5 MG/5ML PO SUSR: 875.0000 mg | Freq: Two times a day (BID) | ORAL | 0 refills | Status: AC

## 2018-10-18 NOTE — Telephone Encounter (Signed)
Primary Pulmonologist: BQ Last office visit and with whom: 08/16/18 with BQ What do we see them for (pulmonary problems): COPD Last OV assessment/plan: Left knee pain: I wonder about something called a Baker's cyst We will check an ultrasound to look for this  Severe COPD: Continue Trelegy Continue daily azithromycin When you see Dr. Damita Dunnings I would like for him to obtain liver function test to monitor for toxicity from azithromycin Continue albuterol as you are doing Practice good hand hygiene Stay active  Recurrent aspiration events: Continue to follow speech therapy's recommendations with swallowing  We will see you back in 2 to 3 months or sooner if needed  Was appointment offered to patient (explain)?  No, due to covid19   Reason for call: Spoke with Amy. She stated that she went to visit the patient today. He has been coughing a lot more recently. He has a productive cough with phlegm that ranges in color from clear to green to brown. She listened to his lungs today and did hear some crackles in his RUL. Per family, his wheezing is his new normal. Temp was checked yesterday and it was 97.4. When it was checked today, it went up to 98.72F.   She wants to know if of any recommendations for the patient. She also stated that if a CXR was needed, she could arrange for one to be done at the patient's home. She just needs a verbal.   TP, please advise since BQ is not in the office today.

## 2018-10-18 NOTE — Telephone Encounter (Signed)
okay for liquid Augmentin 875mg  BID x7 days Hold azitrhomycin while on augmentin , once done restart Azithromycin.  Mucinex DM Twice daily  .As needed  Cough/congestion  Continue  on Flutter valve Three times a day    That is fine to do portable Chest xray at home if possible and patient agrees, make sures he wears a mask when they do it and Xray tech would need to wear mask as well.  Patient and family- checked with family , decline at this time    Please contact office for sooner follow up if symptoms do not improve or worsen or seek emergency care

## 2018-10-18 NOTE — Telephone Encounter (Signed)
Amy from [facility] went to visit pt today - stated that pt was coughing quite a bit and the nurse heard crackling in his right lower lobe. Wants to know if we should do a chest x-ray. Pt had a televisit with PCP yesterday. Had expiratory wheezing every time she listened. Amy states that family took temp during visit and it was 997.9 and then she took it today and it was a little higher and is worried that it is creeping up. Please advise.

## 2018-10-18 NOTE — Telephone Encounter (Signed)
Spoke with Amy, she verbalized understanding. She stated that she would call the patient's family and let them know.   [facility] [address]   Nothing further needed at time of call.

## 2018-10-19 ENCOUNTER — Telehealth: Payer: Self-pay | Admitting: *Deleted

## 2018-10-19 DIAGNOSIS — J069 Acute upper respiratory infection, unspecified: Secondary | ICD-10-CM | POA: Diagnosis not present

## 2018-10-19 DIAGNOSIS — J449 Chronic obstructive pulmonary disease, unspecified: Secondary | ICD-10-CM | POA: Diagnosis not present

## 2018-10-19 NOTE — Telephone Encounter (Signed)
Verbal order given to Amy by telephone as instructed. 

## 2018-10-19 NOTE — Telephone Encounter (Signed)
Amy nurse with [facility] left a voicemail stating that she went out yesterday to admit patient to their care after receiving a referral from the office. Amy stated that she would like a verbal order for nursing to go out two times a week for one week, then once a week for 2 weeks. Amy stated that it is okay to leave the verbal order on her voicemail because it is a secure line.

## 2018-10-19 NOTE — Telephone Encounter (Signed)
Please give the order.  Thanks.   

## 2018-10-21 DIAGNOSIS — D508 Other iron deficiency anemias: Secondary | ICD-10-CM | POA: Diagnosis not present

## 2018-10-21 DIAGNOSIS — I110 Hypertensive heart disease with heart failure: Secondary | ICD-10-CM | POA: Diagnosis not present

## 2018-10-21 DIAGNOSIS — J432 Centrilobular emphysema: Secondary | ICD-10-CM | POA: Diagnosis not present

## 2018-10-21 DIAGNOSIS — L89322 Pressure ulcer of left buttock, stage 2: Secondary | ICD-10-CM | POA: Diagnosis not present

## 2018-10-21 DIAGNOSIS — Z87891 Personal history of nicotine dependence: Secondary | ICD-10-CM | POA: Diagnosis not present

## 2018-10-21 DIAGNOSIS — M549 Dorsalgia, unspecified: Secondary | ICD-10-CM | POA: Diagnosis not present

## 2018-10-21 DIAGNOSIS — G8929 Other chronic pain: Secondary | ICD-10-CM | POA: Diagnosis not present

## 2018-10-21 DIAGNOSIS — E039 Hypothyroidism, unspecified: Secondary | ICD-10-CM | POA: Diagnosis not present

## 2018-10-21 DIAGNOSIS — E871 Hypo-osmolality and hyponatremia: Secondary | ICD-10-CM | POA: Diagnosis not present

## 2018-10-21 DIAGNOSIS — I5032 Chronic diastolic (congestive) heart failure: Secondary | ICD-10-CM | POA: Diagnosis not present

## 2018-10-21 DIAGNOSIS — M25562 Pain in left knee: Secondary | ICD-10-CM | POA: Diagnosis not present

## 2018-10-21 DIAGNOSIS — R1310 Dysphagia, unspecified: Secondary | ICD-10-CM | POA: Diagnosis not present

## 2018-10-21 DIAGNOSIS — R410 Disorientation, unspecified: Secondary | ICD-10-CM | POA: Diagnosis not present

## 2018-10-21 DIAGNOSIS — G9340 Encephalopathy, unspecified: Secondary | ICD-10-CM | POA: Diagnosis not present

## 2018-10-21 DIAGNOSIS — J449 Chronic obstructive pulmonary disease, unspecified: Secondary | ICD-10-CM | POA: Diagnosis not present

## 2018-10-21 DIAGNOSIS — E43 Unspecified severe protein-calorie malnutrition: Secondary | ICD-10-CM | POA: Diagnosis not present

## 2018-10-21 DIAGNOSIS — M47816 Spondylosis without myelopathy or radiculopathy, lumbar region: Secondary | ICD-10-CM | POA: Diagnosis not present

## 2018-10-21 DIAGNOSIS — J9610 Chronic respiratory failure, unspecified whether with hypoxia or hypercapnia: Secondary | ICD-10-CM | POA: Diagnosis not present

## 2018-10-21 DIAGNOSIS — Z9181 History of falling: Secondary | ICD-10-CM | POA: Diagnosis not present

## 2018-10-21 DIAGNOSIS — K219 Gastro-esophageal reflux disease without esophagitis: Secondary | ICD-10-CM | POA: Diagnosis not present

## 2018-10-21 DIAGNOSIS — Z96649 Presence of unspecified artificial hip joint: Secondary | ICD-10-CM | POA: Diagnosis not present

## 2018-10-24 DIAGNOSIS — I5189 Other ill-defined heart diseases: Secondary | ICD-10-CM | POA: Diagnosis not present

## 2018-10-24 DIAGNOSIS — J439 Emphysema, unspecified: Secondary | ICD-10-CM | POA: Diagnosis not present

## 2018-10-24 DIAGNOSIS — J9611 Chronic respiratory failure with hypoxia: Secondary | ICD-10-CM | POA: Diagnosis not present

## 2018-10-24 DIAGNOSIS — J449 Chronic obstructive pulmonary disease, unspecified: Secondary | ICD-10-CM | POA: Diagnosis not present

## 2018-10-25 ENCOUNTER — Telehealth: Payer: Self-pay | Admitting: Adult Health

## 2018-10-25 MED ORDER — ALBUTEROL SULFATE (5 MG/ML) 0.5% IN NEBU: 2.5000 mg | INHALATION_SOLUTION | Freq: Four times a day (QID) | RESPIRATORY_TRACT | 3 refills | Status: DC | PRN

## 2018-10-25 NOTE — Telephone Encounter (Signed)
Received call from patient's granddaughter Tammy requesting refill on patient's albuterol neb [facility] [address] Per TP: okay for refill  Refills sent Granddaughter Lynelle Smoke is aware Nothing further needed; will sign off

## 2018-10-26 ENCOUNTER — Telehealth: Payer: Self-pay | Admitting: Adult Health

## 2018-10-26 DIAGNOSIS — Z96649 Presence of unspecified artificial hip joint: Secondary | ICD-10-CM | POA: Diagnosis not present

## 2018-10-26 DIAGNOSIS — J9610 Chronic respiratory failure, unspecified whether with hypoxia or hypercapnia: Secondary | ICD-10-CM | POA: Diagnosis not present

## 2018-10-26 DIAGNOSIS — I5032 Chronic diastolic (congestive) heart failure: Secondary | ICD-10-CM | POA: Diagnosis not present

## 2018-10-26 DIAGNOSIS — J449 Chronic obstructive pulmonary disease, unspecified: Secondary | ICD-10-CM | POA: Diagnosis not present

## 2018-10-26 DIAGNOSIS — Z87891 Personal history of nicotine dependence: Secondary | ICD-10-CM | POA: Diagnosis not present

## 2018-10-26 DIAGNOSIS — M549 Dorsalgia, unspecified: Secondary | ICD-10-CM | POA: Diagnosis not present

## 2018-10-26 DIAGNOSIS — R1310 Dysphagia, unspecified: Secondary | ICD-10-CM | POA: Diagnosis not present

## 2018-10-26 DIAGNOSIS — K219 Gastro-esophageal reflux disease without esophagitis: Secondary | ICD-10-CM | POA: Diagnosis not present

## 2018-10-26 DIAGNOSIS — E871 Hypo-osmolality and hyponatremia: Secondary | ICD-10-CM | POA: Diagnosis not present

## 2018-10-26 DIAGNOSIS — L89322 Pressure ulcer of left buttock, stage 2: Secondary | ICD-10-CM | POA: Diagnosis not present

## 2018-10-26 DIAGNOSIS — M47816 Spondylosis without myelopathy or radiculopathy, lumbar region: Secondary | ICD-10-CM | POA: Diagnosis not present

## 2018-10-26 DIAGNOSIS — J432 Centrilobular emphysema: Secondary | ICD-10-CM | POA: Diagnosis not present

## 2018-10-26 DIAGNOSIS — I110 Hypertensive heart disease with heart failure: Secondary | ICD-10-CM | POA: Diagnosis not present

## 2018-10-26 DIAGNOSIS — E039 Hypothyroidism, unspecified: Secondary | ICD-10-CM | POA: Diagnosis not present

## 2018-10-26 DIAGNOSIS — Z9181 History of falling: Secondary | ICD-10-CM | POA: Diagnosis not present

## 2018-10-26 DIAGNOSIS — G8929 Other chronic pain: Secondary | ICD-10-CM | POA: Diagnosis not present

## 2018-10-26 DIAGNOSIS — E43 Unspecified severe protein-calorie malnutrition: Secondary | ICD-10-CM | POA: Diagnosis not present

## 2018-10-26 DIAGNOSIS — M25562 Pain in left knee: Secondary | ICD-10-CM | POA: Diagnosis not present

## 2018-10-26 DIAGNOSIS — D508 Other iron deficiency anemias: Secondary | ICD-10-CM | POA: Diagnosis not present

## 2018-10-26 MED ORDER — ALBUTEROL SULFATE (2.5 MG/3ML) 0.083% IN NEBU: 2.5000 mg | INHALATION_SOLUTION | Freq: Four times a day (QID) | RESPIRATORY_TRACT | 5 refills | Status: DC | PRN

## 2018-10-26 NOTE — Telephone Encounter (Signed)
Discussed with patient's granddaughter Tammy Thought that the concentrated dose that was previously on the med list was previously discussed Aware that this is not the typical dose for our patient population Will change patient's albuterol Rx to the 2.5mg /3L  Rx changed, [facility] and spoke [facility] to make them aware of this change. Patient's granddaughter Lynelle Smoke and daughter [name] are both aware  Nothing further needed; will sign off

## 2018-10-26 NOTE — Telephone Encounter (Signed)
[facility] She stated that they received a refill request yesterday for albuterol 5mg /28mL 0.5%. She stated that this is the concentrated solution and that the patient should be on the 2.17mL/3mL solution. Advised her that I would review his chart and then send over the RX.   Reviewed patient's chart, it appears that he has been getting the concentrated solution for his past refills.   Called [name] to verify this. She stated that she would have to get the box and look at the dosage and then call back.   Will await her call.

## 2018-10-28 ENCOUNTER — Telehealth: Payer: Self-pay | Admitting: Pulmonary Disease

## 2018-10-28 ENCOUNTER — Telehealth: Payer: Self-pay | Admitting: Adult Health

## 2018-10-28 MED ORDER — AZITHROMYCIN 250 MG PO TABS: 250.0000 mg | ORAL_TABLET | Freq: Every day | ORAL | 5 refills | Status: DC

## 2018-10-28 NOTE — Telephone Encounter (Signed)
Received call from patient's granddaughter Tammy requesting refill on patient's daily azithromycin 250mg   Last office visit 3.3.2020 with BQ: Instructions Return in about 3 months (around 11/16/2018).  Left knee pain: I wonder about something called a Baker's cyst We will check an ultrasound to look for this   Severe COPD: Continue Trelegy Continue daily azithromycin When you see Dr. Damita Dunnings I would like for him to obtain liver function test to monitor for toxicity from azithromycin Continue albuterol as you are doing Practice good hand hygiene Stay active   Recurrent aspiration events: Continue to follow speech therapy's recommendations with swallowing   We will see you back in 2 to 3 months or sooner if needed      Refills sent Tammy is aware Nothing further needed; will sign off

## 2018-10-28 NOTE — Telephone Encounter (Signed)
Checked previous office visits to see if I could figure out why pt was on the daily azithromycin and found that he has been on daily abx due to his recurrent aspiration pneumonia as this abx is hopefully to prevent further aspirations. Called pt's pharmacy and spoke with Adonis Huguenin and provided her the dx code for the azithromycin. Nothing further needed.

## 2018-11-02 DIAGNOSIS — J449 Chronic obstructive pulmonary disease, unspecified: Secondary | ICD-10-CM | POA: Diagnosis not present

## 2018-11-03 ENCOUNTER — Telehealth: Payer: Self-pay | Admitting: Pulmonary Disease

## 2018-11-03 MED ORDER — TORSEMIDE 20 MG PO TABS: 20.0000 mg | ORAL_TABLET | Freq: Every day | ORAL | 2 refills | Status: DC | PRN

## 2018-11-03 NOTE — Telephone Encounter (Signed)
Patient's granddaughter [name] [name] requesting refill on patient's Torsemide 20mg  daily prn Last office visit 3.3.2020 w/ BQ Upcoming appt 6.18.2020 w/ Par[place] NP   Per the 4.11.19 visit with BQ: Fluid retention/pulmonary edema due to acute diastolic heart failure: Take torsemide daily as directed by your primary care physician When the ankle swelling goes away, weigh yourself this is your dry weight If you have no ankle swelling and you only need to use the torsemide if ankle swelling develops or if your weight is 2 pounds or more above your dry weight  Refills sent Granddaughter Lynelle Smoke is aware Nothing further needed; will sign off

## 2018-11-09 ENCOUNTER — Ambulatory Visit: Payer: Self-pay | Admitting: Pulmonary Disease

## 2018-11-10 ENCOUNTER — Ambulatory Visit (INDEPENDENT_AMBULATORY_CARE_PROVIDER_SITE_OTHER): Payer: Medicare Other | Admitting: Adult Health

## 2018-11-10 ENCOUNTER — Other Ambulatory Visit: Payer: Self-pay

## 2018-11-10 ENCOUNTER — Ambulatory Visit (INDEPENDENT_AMBULATORY_CARE_PROVIDER_SITE_OTHER): Payer: Medicare Other

## 2018-11-10 ENCOUNTER — Encounter: Payer: Self-pay | Admitting: Adult Health

## 2018-11-10 VITALS — BP 122/74 | HR 74 | Temp 98.6°F | Ht 65.0 in | Wt 135.6 lb

## 2018-11-10 DIAGNOSIS — J439 Emphysema, unspecified: Secondary | ICD-10-CM | POA: Diagnosis not present

## 2018-11-10 DIAGNOSIS — K219 Gastro-esophageal reflux disease without esophagitis: Secondary | ICD-10-CM

## 2018-11-10 DIAGNOSIS — R059 Cough, unspecified: Secondary | ICD-10-CM

## 2018-11-10 DIAGNOSIS — I5032 Chronic diastolic (congestive) heart failure: Secondary | ICD-10-CM

## 2018-11-10 DIAGNOSIS — R05 Cough: Secondary | ICD-10-CM

## 2018-11-10 NOTE — Assessment & Plan Note (Signed)
Recent flare now resolving- CXR without acute process  If increased cough persists , check sputum cx (as hx of psuedomonas colonization )   Plan  Patient Instructions  Severe COPD with recurrent exacerbations Check Sputum culture today .  Continue daily azithromycin Continue Trelegy 1 puff daily Use albuterol as needed for chest tightness wheezing or shortness of breath, it is also okay to use this if there is excessive rattling in his chest Practice good hand hygiene Stay active Use Flutter valve Three times a day    Chronic diastolic heart failure: Continue to monitor for leg swelling Keep sodium intake less than 2 g a day  Dysphagia with recurrent aspiration events: I recommend slow swallowing, small bites   Follow up with Dr. Lake Bells in 3 months and As needed   Please contact office for sooner follow up if symptoms do not improve or worsen or seek emergency care

## 2018-11-10 NOTE — Patient Instructions (Addendum)
Severe COPD with recurrent exacerbations Check Sputum culture today .  Continue daily azithromycin Continue Trelegy 1 puff daily Use albuterol as needed for chest tightness wheezing or shortness of breath, it is also okay to use this if there is excessive rattling in his chest Practice good hand hygiene Stay active Use Flutter valve Three times a day    Chronic diastolic heart failure: Continue to monitor for leg swelling Keep sodium intake less than 2 g a day  Dysphagia with recurrent aspiration events: I recommend slow swallowing, small bites   Follow up with Dr. Lake Bells in 3 months and As needed   Please contact office for sooner follow up if symptoms do not improve or worsen or seek emergency care

## 2018-11-10 NOTE — Progress Notes (Signed)
@Patient  ID: [name] Seller Sr., male    DOB: [birth year], 85 y.o.   MRN: [id number]  Chief Complaint  Patient presents with  . Follow-up    COPD ,     Referring provider: Tonia Ghent, MD  HPI: 85  year-old male former smoker followed for emphysema/COPD , Diastolic CHF, Prone to recurrent aspiration    TEST/EVENTS :  Synopsis: former patient of Dr. Gwenette Greet with COPD Smoked cigarettes for many years, from age 53 to age 62 1.5 ppd CXR/CT chest 2014: Emphysematous changes with mild scarring, +dilated esophagus with A/F level.  PFT"s 08/2012: Normal FEV1%, but FVC improved 17% with BD. +++ airtrapping/ +FVL, no restriction, DLCO normal  Echo 09/2012: Normal LV EF, diastolic dysfxn, mildly dilated LA, RV normal.  CT angio 09/2012: No PE, small nodule posterior LUL, GG anterior lingula ?significance, scattered areas of atx. No significant emphysematous changes or ISLD 07/2015 Pseudomonas (pan sensitive )  Sputum cx and AFB neg on 12/23/15 .  11/10/2018 Follow up : COPD , D CHF  Patient returns for a 37-month follow-up.  Patient has underlying COPD prone to recurrent exacerbations, Pseudomonas colonization and history of recurrent aspiration. Patient is on Trelegy daily.  Daily azithromycin.  Flutter valve and albuterol nebulizer treatments as needed.  Patient had increased cough and congestion earlier this month.  He was called and a 7-day course of Augmentin.  Cough and congestion have improved slightly but he has a daily cough that seems to wax and wane.  He intermittently gets up thick dark mucus.  Patient says overall he feels okay.  He has a good appetite.  He denies any recent episodes of choking or difficulty swallowing.  He denies any fever confusion or increased leg swelling. Chest xray today shows chronic changes without acute process .        Allergies  Allergen Reactions  . Codeine Nausea And Vomiting    Immunization History  Administered Date(s) Administered  .  Influenza Split 02/15/2012, 02/13/2014, 05/03/2015  . Influenza, High Dose Seasonal PF 02/28/2016, 04/07/2017  . Influenza,inj,Quad PF,6+ Mos 03/08/2013, 04/26/2018  . Pneumococcal Polysaccharide-23 06/15/2010  . Tdap 10/27/2017    Past Medical History:  Diagnosis Date  . Anemia   . COPD (chronic obstructive pulmonary disease) (HCC)    Mild  . HTN (hypertension)   . Hypothyroidism     Tobacco History: Social History   Tobacco Use  Smoking Status Former Smoker  . Packs/day: 1.50  . Years: 25.00  . Pack years: 37.50  . Types: Cigarettes  . Last attempt to quit: 06/16/1971  . Years since quitting: 47.4  Smokeless Tobacco Never Used  Tobacco Comment   started at 85 years old   Counseling given: Not Answered Comment: started at 85 years old   Outpatient Medications Prior to Visit  Medication Sig Dispense Refill  . albuterol (PROVENTIL) (2.5 MG/3ML) 0.083% nebulizer solution Take 3 mLs (2.5 mg total) by nebulization every 6 (six) hours as needed for wheezing or shortness of breath (dx J43.2). 125 mL 5  . azithromycin (ZITHROMAX) 250 MG tablet Take 1 tablet (250 mg total) by mouth daily. 30 tablet 5  . Calcium Carb-Cholecalciferol (CALCIUM-VITAMIN D) 500-200 MG-UNIT tablet Take 1 tablet by mouth daily.    . cetirizine (ZYRTEC) 10 MG tablet Take 10 mg by mouth daily.    . ferrous sulfate 325 (65 FE) MG EC tablet Take 325 mg by mouth daily with breakfast.    . Fluticasone-Umeclidin-Vilant 100-62.5-25 MCG/INH AEPB Inhale  1 puff into the lungs every day.  60 each 5  . guaiFENesin (ROBITUSSIN) 100 MG/5ML liquid Take 10 mLs (200 mg total) by mouth 3 (three) times daily as needed for cough.    . levothyroxine (SYNTHROID, LEVOTHROID) 100 MCG tablet Place 1 tablet (100 mcg total) into feeding tube daily. 90 tablet 3  . mupirocin cream (BACTROBAN) 2 % Apply 1 application topically 2 (two) times daily. 15 g 0  . omeprazole (PRILOSEC) 40 MG capsule Take 1 capsule (40 mg total) by mouth  daily.    . polyethylene glycol (MIRALAX / GLYCOLAX) packet Take 17 g by mouth daily as needed for mild constipation.    . QUEtiapine (SEROQUEL) 25 MG tablet PLACE 1/2 TABLET INTO FEEDING TUBE AT BEDTIME 45 tablet 0  . Respiratory Therapy Supplies (FLUTTER) DEVI Use as directed 1 each 0  . torsemide (DEMADEX) 20 MG tablet Take 1 tablet (20 mg total) by mouth daily as needed (ankle swelling or 2 pounds above dry weight). 30 tablet 2  . verapamil (CALAN) 80 MG tablet PLACE 1 TABLET INTO FEEDING TUBE TWICE A DAY 180 tablet 1  . Multiple Vitamin (MULTIVITAMIN) tablet Take 1 tablet by mouth daily. (Patient not taking: Reported on 11/10/2018)    . Respiratory Therapy Supplies (FLUTTER) DEVI Use as directed 1 each 0   No facility-administered medications prior to visit.      Review of Systems:   Constitutional:   No  weight loss, night sweats,  Fevers, chills, + fatigue, or  lassitude.  HEENT:   No headaches,  Difficulty swallowing,  Tooth/dental problems, or  Sore throat,                No sneezing, itching, ear ache, + nasal congestion, post nasal drip,   CV:  No chest pain,  Orthopnea, PND, swelling in lower extremities, anasarca, dizziness, palpitations, syncope.   GI  No heartburn, indigestion, abdominal pain, nausea, vomiting, diarrhea, change in bowel habits, loss of appetite, bloody stools.   Resp:    No chest wall deformity  Skin: no rash or lesions.  GU: no dysuria, change in color of urine, no urgency or frequency.  No flank pain, no hematuria   MS:  No joint pain or swelling.  No decreased range of motion.  No back pain.    Physical Exam  BP 122/74 (BP Location: Left Arm, Cuff Size: Normal)   Pulse 74   Temp 98.6 F (37 C) (Oral)   Ht 5\' 5"  (1.651 m)   Wt 135 lb 9.6 oz (61.5 kg)   SpO2 92%   BMI 22.57 kg/m   GEN: A/Ox3; pleasant , NAD, elderly in wheelchair   HEENT:  [place]/AT,   NOSE-clear, THROAT-clear, no lesions, no postnasal drip or exudate noted.  Edentulous   NECK:  Supple w/ fair ROM; no JVD; normal carotid impulses w/o bruits; no thyromegaly or nodules palpated; no lymphadenopathy.    RESP scattered rhonchi   no accessory muscle use, no dullness to percussion  CARD:  RRR, no m/r/g, trace peripheral edema, pulses intact, no cyanosis or clubbing.  GI:   Soft & nt; nml bowel sounds; no organomegaly or masses detected.   Musco: Warm bil, no deformities or joint swelling noted.   Neuro: alert, no focal deficits noted.    Skin: Warm, no lesions or rashes    Lab Results:  CBC    Component Value Date/Time   WBC 6.5 08/23/2018 1620   RBC 3.83 (L) 08/23/2018 1620  HGB 12.1 (L) 08/23/2018 1620   HCT 35.9 (L) 08/23/2018 1620   HCT 38.5 03/22/2018 2302   PLT 139.0 (L) 08/23/2018 1620   MCV 93.7 08/23/2018 1620   MCH 31.7 03/30/2018 0847   MCHC 33.8 08/23/2018 1620   RDW 13.8 08/23/2018 1620   LYMPHSABS 1.5 08/23/2018 1620   MONOABS 0.4 08/23/2018 1620   EOSABS 0.3 08/23/2018 1620   BASOSABS 0.1 08/23/2018 1620    BMET    Component Value Date/Time   NA 137 08/23/2018 1620   K 4.4 08/23/2018 1620   CL 104 08/23/2018 1620   CO2 27 08/23/2018 1620   GLUCOSE 105 (H) 08/23/2018 1620   BUN 22 08/23/2018 1620   CREATININE 1.01 08/23/2018 1620   CREATININE 1.27 10/24/2012 1006   CALCIUM 8.7 08/23/2018 1620   GFRNONAA >60 04/02/2018 0404   GFRAA >60 04/02/2018 0404    BNP No results found for: BNP  ProBNP    Component Value Date/Time   PROBNP 123.0 (H) 01/12/2018 1613    Imaging: Dg Chest 2 View  Result Date: 11/10/2018 CLINICAL DATA:  Persistent cough, congestion EXAM: CHEST - 2 VIEW COMPARISON:  03/29/2018 FINDINGS: The heart size and mediastinal contours are within normal limits. Right basilar scarring. The visualized skeletal structures are unremarkable. IMPRESSION: Right basilar scarring.  No acute abnormality of the lungs. Electronically Signed   By: Eddie Candle M.D.   On: 11/10/2018 14:21      No flowsheet data  found.  No results found for: NITRICOXIDE      Assessment & Plan:   COPD (chronic obstructive pulmonary disease) with emphysema (HCC) Recent flare now resolving- CXR without acute process  If increased cough persists , check sputum cx (as hx of psuedomonas colonization )   Plan  Patient Instructions  Severe COPD with recurrent exacerbations Check Sputum culture today .  Continue daily azithromycin Continue Trelegy 1 puff daily Use albuterol as needed for chest tightness wheezing or shortness of breath, it is also okay to use this if there is excessive rattling in his chest Practice good hand hygiene Stay active Use Flutter valve Three times a day    Chronic diastolic heart failure: Continue to monitor for leg swelling Keep sodium intake less than 2 g a day  Dysphagia with recurrent aspiration events: I recommend slow swallowing, small bites   Follow up with Dr. Lake Bells in 3 months and As needed   Please contact office for sooner follow up if symptoms do not improve or worsen or seek emergency care         Chronic diastolic heart failure ([place]) Appears stable  No changes   GERD (gastroesophageal reflux disease) Aspiration -prone  Swallow precautions      Rexene Edison, NP 11/10/2018

## 2018-11-10 NOTE — Assessment & Plan Note (Signed)
Aspiration -prone  Swallow precautions

## 2018-11-10 NOTE — Assessment & Plan Note (Signed)
Appears stable  No changes  

## 2018-11-11 ENCOUNTER — Other Ambulatory Visit: Payer: Self-pay | Admitting: Family Medicine

## 2018-11-11 NOTE — Telephone Encounter (Signed)
Electronic refill request Omeprazole Last office visit 10/17/18 Please confirm directions/request from pharmacy shows by G tube

## 2018-11-13 NOTE — Progress Notes (Signed)
Reviewed, agree 

## 2018-11-13 NOTE — Telephone Encounter (Signed)
Sent. Thanks.  Sig adjusted.

## 2018-11-17 DIAGNOSIS — R410 Disorientation, unspecified: Secondary | ICD-10-CM | POA: Diagnosis not present

## 2018-11-17 DIAGNOSIS — E43 Unspecified severe protein-calorie malnutrition: Secondary | ICD-10-CM | POA: Diagnosis not present

## 2018-11-17 DIAGNOSIS — G9340 Encephalopathy, unspecified: Secondary | ICD-10-CM | POA: Diagnosis not present

## 2018-11-19 DIAGNOSIS — J449 Chronic obstructive pulmonary disease, unspecified: Secondary | ICD-10-CM | POA: Diagnosis not present

## 2018-11-24 DIAGNOSIS — I5189 Other ill-defined heart diseases: Secondary | ICD-10-CM | POA: Diagnosis not present

## 2018-11-24 DIAGNOSIS — J439 Emphysema, unspecified: Secondary | ICD-10-CM | POA: Diagnosis not present

## 2018-11-24 DIAGNOSIS — J9611 Chronic respiratory failure with hypoxia: Secondary | ICD-10-CM | POA: Diagnosis not present

## 2018-11-24 DIAGNOSIS — J449 Chronic obstructive pulmonary disease, unspecified: Secondary | ICD-10-CM | POA: Diagnosis not present

## 2018-11-29 ENCOUNTER — Telehealth: Payer: Self-pay

## 2018-11-29 NOTE — Telephone Encounter (Signed)
Amy Pope nurse with [facility] left v/m requesting clarification on physical wound being treated on sacrum. Amy request Dr Damita Dunnings to say pt has Stage II pressure wound on sacrum. This is needed due to new medicare regulation for medicare to process [facility] charges.Please advise.

## 2018-11-30 NOTE — Telephone Encounter (Signed)
Per pts chart Amy said if no answer could leave detailed v/m. Amy did not answer so left detailed v/m as instructed.

## 2018-11-30 NOTE — Telephone Encounter (Signed)
Noted that pt has Stage II pressure wound on sacrum, please give the order/statement.  Thanks.

## 2018-12-01 ENCOUNTER — Ambulatory Visit: Payer: Self-pay | Admitting: Adult Health

## 2018-12-03 DIAGNOSIS — J449 Chronic obstructive pulmonary disease, unspecified: Secondary | ICD-10-CM | POA: Diagnosis not present

## 2018-12-07 DIAGNOSIS — I110 Hypertensive heart disease with heart failure: Secondary | ICD-10-CM

## 2018-12-07 DIAGNOSIS — M25562 Pain in left knee: Secondary | ICD-10-CM

## 2018-12-07 DIAGNOSIS — K219 Gastro-esophageal reflux disease without esophagitis: Secondary | ICD-10-CM

## 2018-12-07 DIAGNOSIS — E871 Hypo-osmolality and hyponatremia: Secondary | ICD-10-CM

## 2018-12-07 DIAGNOSIS — M47816 Spondylosis without myelopathy or radiculopathy, lumbar region: Secondary | ICD-10-CM

## 2018-12-07 DIAGNOSIS — Z9181 History of falling: Secondary | ICD-10-CM

## 2018-12-07 DIAGNOSIS — Z96649 Presence of unspecified artificial hip joint: Secondary | ICD-10-CM

## 2018-12-07 DIAGNOSIS — I5032 Chronic diastolic (congestive) heart failure: Secondary | ICD-10-CM

## 2018-12-07 DIAGNOSIS — E039 Hypothyroidism, unspecified: Secondary | ICD-10-CM

## 2018-12-07 DIAGNOSIS — M549 Dorsalgia, unspecified: Secondary | ICD-10-CM

## 2018-12-07 DIAGNOSIS — J9610 Chronic respiratory failure, unspecified whether with hypoxia or hypercapnia: Secondary | ICD-10-CM | POA: Diagnosis not present

## 2018-12-07 DIAGNOSIS — Z8701 Personal history of pneumonia (recurrent): Secondary | ICD-10-CM

## 2018-12-07 DIAGNOSIS — G8929 Other chronic pain: Secondary | ICD-10-CM

## 2018-12-07 DIAGNOSIS — J432 Centrilobular emphysema: Secondary | ICD-10-CM | POA: Diagnosis not present

## 2018-12-07 DIAGNOSIS — D508 Other iron deficiency anemias: Secondary | ICD-10-CM

## 2018-12-07 DIAGNOSIS — L89322 Pressure ulcer of left buttock, stage 2: Secondary | ICD-10-CM | POA: Diagnosis not present

## 2018-12-07 DIAGNOSIS — N400 Enlarged prostate without lower urinary tract symptoms: Secondary | ICD-10-CM

## 2018-12-07 DIAGNOSIS — E43 Unspecified severe protein-calorie malnutrition: Secondary | ICD-10-CM

## 2018-12-07 DIAGNOSIS — R1310 Dysphagia, unspecified: Secondary | ICD-10-CM

## 2018-12-07 DIAGNOSIS — J449 Chronic obstructive pulmonary disease, unspecified: Secondary | ICD-10-CM | POA: Diagnosis not present

## 2018-12-07 DIAGNOSIS — Z87891 Personal history of nicotine dependence: Secondary | ICD-10-CM

## 2018-12-17 DIAGNOSIS — G9340 Encephalopathy, unspecified: Secondary | ICD-10-CM | POA: Diagnosis not present

## 2018-12-17 DIAGNOSIS — E43 Unspecified severe protein-calorie malnutrition: Secondary | ICD-10-CM | POA: Diagnosis not present

## 2018-12-17 DIAGNOSIS — R410 Disorientation, unspecified: Secondary | ICD-10-CM | POA: Diagnosis not present

## 2018-12-19 DIAGNOSIS — J449 Chronic obstructive pulmonary disease, unspecified: Secondary | ICD-10-CM | POA: Diagnosis not present

## 2018-12-24 DIAGNOSIS — J449 Chronic obstructive pulmonary disease, unspecified: Secondary | ICD-10-CM | POA: Diagnosis not present

## 2018-12-24 DIAGNOSIS — I5189 Other ill-defined heart diseases: Secondary | ICD-10-CM | POA: Diagnosis not present

## 2018-12-24 DIAGNOSIS — J439 Emphysema, unspecified: Secondary | ICD-10-CM | POA: Diagnosis not present

## 2018-12-24 DIAGNOSIS — J9611 Chronic respiratory failure with hypoxia: Secondary | ICD-10-CM | POA: Diagnosis not present

## 2018-12-27 ENCOUNTER — Other Ambulatory Visit: Payer: Self-pay

## 2018-12-27 ENCOUNTER — Ambulatory Visit (INDEPENDENT_AMBULATORY_CARE_PROVIDER_SITE_OTHER): Payer: Medicare Other | Admitting: Family Medicine

## 2018-12-27 ENCOUNTER — Encounter: Payer: Self-pay | Admitting: Family Medicine

## 2018-12-27 VITALS — BP 138/70 | HR 84 | Temp 97.8°F | Ht 65.0 in | Wt 137.1 lb

## 2018-12-27 DIAGNOSIS — L989 Disorder of the skin and subcutaneous tissue, unspecified: Secondary | ICD-10-CM

## 2018-12-27 DIAGNOSIS — D508 Other iron deficiency anemias: Secondary | ICD-10-CM | POA: Diagnosis not present

## 2018-12-27 DIAGNOSIS — L98429 Non-pressure chronic ulcer of back with unspecified severity: Secondary | ICD-10-CM | POA: Diagnosis not present

## 2018-12-27 DIAGNOSIS — T17908D Unspecified foreign body in respiratory tract, part unspecified causing other injury, subsequent encounter: Secondary | ICD-10-CM

## 2018-12-27 DIAGNOSIS — J439 Emphysema, unspecified: Secondary | ICD-10-CM | POA: Diagnosis not present

## 2018-12-27 MED ORDER — VERAPAMIL HCL 80 MG PO TABS: ORAL_TABLET | ORAL | Status: DC

## 2018-12-27 MED ORDER — LEVOTHYROXINE SODIUM 100 MCG PO TABS: 100.0000 ug | ORAL_TABLET | Freq: Every day | ORAL | Status: DC

## 2018-12-27 MED ORDER — QUETIAPINE FUMARATE 25 MG PO TABS: ORAL_TABLET | ORAL | Status: DC

## 2018-12-27 NOTE — Progress Notes (Signed)
Sacral lesion healed but then return, then healed again.  Currently not open but it tends to be recurrent.  We talked about topical treatment.  Desitin seems to help more than other skin dressings.  We talked about trying to rotate/limit pressure when he is sitting.  He still has some cough but better than prev, with some sputum, grey vs clear.  Not yellow or green.  Still azithromycin.  Sputum production improved with sitting up.  He isn't SOB when supine but he has some SOB with walking. Prev he was walking 4 labs in the house, now doing 2 labs at a time.  He is still using flutter valve.    "Good appetite" usually with adequate swallowing per patient and family.  Asp cautions d/w pt.    H/o anemia and still on iron.  Due for follow-up labs.  See notes on labs.  Chronic scabbed lesion on the L side of the neck present for >1 year.  He is still picking at the lesion.  Is smaller than prev, appears to be healing now per family and patient report.  2cm x 0.5cm.    No recent falls.    Recheck BP 138/70  Meds, vitals, and allergies reviewed.   ROS: Per HPI unless specifically indicated in ROS section   nad Elderly man in wheelchair Normocephalic atraumatic Scabbed lesion on the left side of the neck.  No ulceration. 2cm x 0.5cm.   rrr No focal decrease in breath sounds.  No wheeze.  Occasional cough.  Occasional bilateral lower lung expiratory rhonchi. abd soft Extremities with trace edema. No skin breakdown on the sacrum but he does have some old postinflammatory hyperpigmentation noted.

## 2018-12-27 NOTE — Patient Instructions (Signed)
If the spot on your neck continues to get smaller, then I wouldn't do anything different.  If bigger, then let me know.  Try to stop picking at it.   Go to the lab on the way out.  We'll contact you with your lab report.  Don't change your meds for now.   See if the medical supply stores have any variable support pillows that would allow for easier pressure/adjustment while sitting.   Take care.  Glad to see you.

## 2018-12-28 DIAGNOSIS — L989 Disorder of the skin and subcutaneous tissue, unspecified: Secondary | ICD-10-CM | POA: Insufficient documentation

## 2018-12-28 LAB — CBC WITH DIFFERENTIAL/PLATELET
Basophils Absolute: 0.1 10*3/uL (ref 0.0–0.1)
Basophils Relative: 0.9 % (ref 0.0–3.0)
Eosinophils Absolute: 0.3 10*3/uL (ref 0.0–0.7)
Eosinophils Relative: 4 % (ref 0.0–5.0)
HCT: 40.6 % (ref 39.0–52.0)
Hemoglobin: 13.3 g/dL (ref 13.0–17.0)
Lymphocytes Relative: 20.6 % (ref 12.0–46.0)
Lymphs Abs: 1.5 10*3/uL (ref 0.7–4.0)
MCHC: 32.8 g/dL (ref 30.0–36.0)
MCV: 95 fl (ref 78.0–100.0)
Monocytes Absolute: 0.4 10*3/uL (ref 0.1–1.0)
Monocytes Relative: 6.1 % (ref 3.0–12.0)
Neutro Abs: 4.9 10*3/uL (ref 1.4–7.7)
Neutrophils Relative %: 68.4 % (ref 43.0–77.0)
Platelets: 146 10*3/uL — ABNORMAL LOW (ref 150.0–400.0)
RBC: 4.27 Mil/uL (ref 4.22–5.81)
RDW: 14.4 % (ref 11.5–15.5)
WBC: 7.2 10*3/uL (ref 4.0–10.5)

## 2018-12-28 LAB — COMPREHENSIVE METABOLIC PANEL
ALT: 5 U/L (ref 0–53)
AST: 12 U/L (ref 0–37)
Albumin: 3.7 g/dL (ref 3.5–5.2)
Alkaline Phosphatase: 69 U/L (ref 39–117)
BUN: 22 mg/dL (ref 6–23)
CO2: 26 mEq/L (ref 19–32)
Calcium: 8.8 mg/dL (ref 8.4–10.5)
Chloride: 106 mEq/L (ref 96–112)
Creatinine, Ser: 0.92 mg/dL (ref 0.40–1.50)
GFR: 77.02 mL/min (ref >60.00)
Glucose, Bld: 97 mg/dL (ref 70–99)
Potassium: 4.2 mEq/L (ref 3.5–5.1)
Sodium: 140 mEq/L (ref 135–145)
Total Bilirubin: 0.5 mg/dL (ref 0.2–1.2)
Total Protein: 6.7 g/dL (ref 6.0–8.3)

## 2018-12-28 LAB — IRON: Iron: 48 ug/dL (ref 42–165)

## 2018-12-28 NOTE — Assessment & Plan Note (Signed)
See notes on labs.  No change in meds at this point.   

## 2018-12-28 NOTE — Assessment & Plan Note (Signed)
He initially got cut when he was shaving.  He had been picking at it in the meantime but it does appear to be healing and since it is not ulcerated I do not think it makes sense to try to biopsy the lesion.  Family will update me as needed.

## 2018-12-28 NOTE — Assessment & Plan Note (Addendum)
History of, this is now healed.  Discussed adequate protein intake and also trying to vary the pressure on his sacrum.  I do not know if there is a pneumatic device that the family can get at a medical supply store that could be used to vary the pressure more effectively when he sitting.  Family is going to check on this.  Update me as needed. >25 minutes spent in face to face time with patient, >50% spent in counselling or coordination of care.

## 2018-12-28 NOTE — Assessment & Plan Note (Signed)
History of.  Aspiration cautions discussed with patient.  He does not have a feeding tube currently.

## 2018-12-28 NOTE — Assessment & Plan Note (Signed)
His sputum is not discolored now.  Continue current medications.  Continue flutter valve.  Discussed pulmonary toilet.  Update me as needed.

## 2018-12-31 ENCOUNTER — Other Ambulatory Visit: Payer: Self-pay | Admitting: Family Medicine

## 2019-01-02 DIAGNOSIS — J449 Chronic obstructive pulmonary disease, unspecified: Secondary | ICD-10-CM | POA: Diagnosis not present

## 2019-01-02 NOTE — Telephone Encounter (Signed)
Electronic refill request. Quetiapine Last office visit:   12/27/2018 Last Filled:   #45  0 RF   On 10/17/2018 Please advise.

## 2019-01-03 NOTE — Telephone Encounter (Signed)
Sent. Thanks.   

## 2019-01-17 DIAGNOSIS — G9340 Encephalopathy, unspecified: Secondary | ICD-10-CM | POA: Diagnosis not present

## 2019-01-17 DIAGNOSIS — R410 Disorientation, unspecified: Secondary | ICD-10-CM | POA: Diagnosis not present

## 2019-01-17 DIAGNOSIS — E43 Unspecified severe protein-calorie malnutrition: Secondary | ICD-10-CM | POA: Diagnosis not present

## 2019-01-19 DIAGNOSIS — J449 Chronic obstructive pulmonary disease, unspecified: Secondary | ICD-10-CM | POA: Diagnosis not present

## 2019-01-24 DIAGNOSIS — I5189 Other ill-defined heart diseases: Secondary | ICD-10-CM | POA: Diagnosis not present

## 2019-01-24 DIAGNOSIS — J9611 Chronic respiratory failure with hypoxia: Secondary | ICD-10-CM | POA: Diagnosis not present

## 2019-01-24 DIAGNOSIS — J449 Chronic obstructive pulmonary disease, unspecified: Secondary | ICD-10-CM | POA: Diagnosis not present

## 2019-01-24 DIAGNOSIS — J439 Emphysema, unspecified: Secondary | ICD-10-CM | POA: Diagnosis not present

## 2019-01-29 ENCOUNTER — Other Ambulatory Visit: Payer: Self-pay | Admitting: Pulmonary Disease

## 2019-01-29 ENCOUNTER — Other Ambulatory Visit: Payer: Self-pay | Admitting: Family Medicine

## 2019-02-02 DIAGNOSIS — J449 Chronic obstructive pulmonary disease, unspecified: Secondary | ICD-10-CM | POA: Diagnosis not present

## 2019-02-10 ENCOUNTER — Telehealth: Payer: Self-pay

## 2019-02-10 DIAGNOSIS — L98429 Non-pressure chronic ulcer of back with unspecified severity: Secondary | ICD-10-CM

## 2019-02-10 DIAGNOSIS — T17908D Unspecified foreign body in respiratory tract, part unspecified causing other injury, subsequent encounter: Secondary | ICD-10-CM

## 2019-02-10 NOTE — Telephone Encounter (Signed)
[name] [name] (DPR signed) left v/m requesting referral to home health for evaluation for needs, nursing, OT,PT and [place] aide; any service that would help take care of pt at home.last seen 12/27/18.

## 2019-02-12 NOTE — Telephone Encounter (Signed)
Ordered. Thanks

## 2019-02-13 NOTE — Telephone Encounter (Signed)
Tammy notified by telephone has been done by Dr. Damita Dunnings. Advised Tammy that one of the referral coordinators will be touch with them to get this set up and she verbalized understanding.

## 2019-02-13 NOTE — Telephone Encounter (Signed)
Left message on voicemail for [name] [name] to call back.

## 2019-02-17 DIAGNOSIS — E43 Unspecified severe protein-calorie malnutrition: Secondary | ICD-10-CM | POA: Diagnosis not present

## 2019-02-17 DIAGNOSIS — R410 Disorientation, unspecified: Secondary | ICD-10-CM | POA: Diagnosis not present

## 2019-02-17 DIAGNOSIS — G9340 Encephalopathy, unspecified: Secondary | ICD-10-CM | POA: Diagnosis not present

## 2019-02-19 DIAGNOSIS — J449 Chronic obstructive pulmonary disease, unspecified: Secondary | ICD-10-CM | POA: Diagnosis not present

## 2019-02-20 DIAGNOSIS — J690 Pneumonitis due to inhalation of food and vomit: Secondary | ICD-10-CM | POA: Diagnosis not present

## 2019-02-20 DIAGNOSIS — D509 Iron deficiency anemia, unspecified: Secondary | ICD-10-CM | POA: Diagnosis not present

## 2019-02-20 DIAGNOSIS — Z4800 Encounter for change or removal of nonsurgical wound dressing: Secondary | ICD-10-CM | POA: Diagnosis not present

## 2019-02-20 DIAGNOSIS — L89313 Pressure ulcer of right buttock, stage 3: Secondary | ICD-10-CM | POA: Diagnosis not present

## 2019-02-20 DIAGNOSIS — T17908D Unspecified foreign body in respiratory tract, part unspecified causing other injury, subsequent encounter: Secondary | ICD-10-CM | POA: Diagnosis not present

## 2019-02-20 DIAGNOSIS — Z7951 Long term (current) use of inhaled steroids: Secondary | ICD-10-CM | POA: Diagnosis not present

## 2019-02-20 DIAGNOSIS — J439 Emphysema, unspecified: Secondary | ICD-10-CM | POA: Diagnosis not present

## 2019-02-21 ENCOUNTER — Telehealth: Payer: Self-pay | Admitting: *Deleted

## 2019-02-21 NOTE — Telephone Encounter (Signed)
Left detailed message on voicemail of [place].

## 2019-02-21 NOTE — Telephone Encounter (Signed)
[name] [name] left a voicemail stating that she is requesting verbal orders for skilled nursing for  Two times a week for one week  Once a week for two weeks Home Health aid once a week for 3 weeks Verbal order for wound care for a pressure ulcer on left buttock, 2 opened areas, stage 2 and she would like to use something like duoderm over the area, change it twice a week and as needed

## 2019-02-21 NOTE — Telephone Encounter (Signed)
Please give the orders.  Thanks.  

## 2019-02-22 DIAGNOSIS — L89313 Pressure ulcer of right buttock, stage 3: Secondary | ICD-10-CM | POA: Diagnosis not present

## 2019-02-22 DIAGNOSIS — J439 Emphysema, unspecified: Secondary | ICD-10-CM | POA: Diagnosis not present

## 2019-02-22 DIAGNOSIS — T17908D Unspecified foreign body in respiratory tract, part unspecified causing other injury, subsequent encounter: Secondary | ICD-10-CM | POA: Diagnosis not present

## 2019-02-22 DIAGNOSIS — D509 Iron deficiency anemia, unspecified: Secondary | ICD-10-CM | POA: Diagnosis not present

## 2019-02-22 DIAGNOSIS — J690 Pneumonitis due to inhalation of food and vomit: Secondary | ICD-10-CM | POA: Diagnosis not present

## 2019-02-22 DIAGNOSIS — Z7951 Long term (current) use of inhaled steroids: Secondary | ICD-10-CM | POA: Diagnosis not present

## 2019-02-22 DIAGNOSIS — Z4800 Encounter for change or removal of nonsurgical wound dressing: Secondary | ICD-10-CM | POA: Diagnosis not present

## 2019-02-23 ENCOUNTER — Other Ambulatory Visit: Payer: Self-pay | Admitting: Family Medicine

## 2019-02-23 ENCOUNTER — Other Ambulatory Visit: Payer: Self-pay | Admitting: Pulmonary Disease

## 2019-02-23 ENCOUNTER — Telehealth: Payer: Self-pay | Admitting: Family Medicine

## 2019-02-23 DIAGNOSIS — T17908D Unspecified foreign body in respiratory tract, part unspecified causing other injury, subsequent encounter: Secondary | ICD-10-CM | POA: Diagnosis not present

## 2019-02-23 DIAGNOSIS — J439 Emphysema, unspecified: Secondary | ICD-10-CM | POA: Diagnosis not present

## 2019-02-23 DIAGNOSIS — Z7951 Long term (current) use of inhaled steroids: Secondary | ICD-10-CM | POA: Diagnosis not present

## 2019-02-23 DIAGNOSIS — J690 Pneumonitis due to inhalation of food and vomit: Secondary | ICD-10-CM | POA: Diagnosis not present

## 2019-02-23 DIAGNOSIS — D509 Iron deficiency anemia, unspecified: Secondary | ICD-10-CM | POA: Diagnosis not present

## 2019-02-23 DIAGNOSIS — Z4800 Encounter for change or removal of nonsurgical wound dressing: Secondary | ICD-10-CM | POA: Diagnosis not present

## 2019-02-23 DIAGNOSIS — L89313 Pressure ulcer of right buttock, stage 3: Secondary | ICD-10-CM | POA: Diagnosis not present

## 2019-02-23 NOTE — Telephone Encounter (Signed)
Please give the order.  Thanks.   

## 2019-02-23 NOTE — Telephone Encounter (Signed)
[name] [name] with [facility] went out to the patient's home yesterday She is requesting verbal orders for Speech therapy  1 week 3 for caregiver education    [phone] She stated she can take a verbal order on her v/m if she does not answer.

## 2019-02-23 NOTE — Telephone Encounter (Signed)
Left detailed message on voicemail of Bethena Roys.

## 2019-02-24 DIAGNOSIS — G9340 Encephalopathy, unspecified: Secondary | ICD-10-CM | POA: Diagnosis not present

## 2019-02-24 DIAGNOSIS — R410 Disorientation, unspecified: Secondary | ICD-10-CM | POA: Diagnosis not present

## 2019-02-24 DIAGNOSIS — L89322 Pressure ulcer of left buttock, stage 2: Secondary | ICD-10-CM | POA: Diagnosis not present

## 2019-02-24 DIAGNOSIS — J439 Emphysema, unspecified: Secondary | ICD-10-CM | POA: Diagnosis not present

## 2019-02-24 DIAGNOSIS — J9611 Chronic respiratory failure with hypoxia: Secondary | ICD-10-CM | POA: Diagnosis not present

## 2019-02-24 DIAGNOSIS — I5189 Other ill-defined heart diseases: Secondary | ICD-10-CM | POA: Diagnosis not present

## 2019-02-24 DIAGNOSIS — E43 Unspecified severe protein-calorie malnutrition: Secondary | ICD-10-CM | POA: Diagnosis not present

## 2019-02-24 DIAGNOSIS — J449 Chronic obstructive pulmonary disease, unspecified: Secondary | ICD-10-CM | POA: Diagnosis not present

## 2019-02-27 ENCOUNTER — Telehealth: Payer: Self-pay

## 2019-02-27 NOTE — Telephone Encounter (Signed)
Sent. Thanks.   

## 2019-02-27 NOTE — Telephone Encounter (Signed)
[name] [name] with PT with [facility] left v/m requesting verbal orders for [facility] PT 2 x a wk for 4 wks to work on transfers, gait training, fall prevention, lower extremity strengthening. Please advise.

## 2019-02-28 DIAGNOSIS — Z4800 Encounter for change or removal of nonsurgical wound dressing: Secondary | ICD-10-CM | POA: Diagnosis not present

## 2019-02-28 DIAGNOSIS — J690 Pneumonitis due to inhalation of food and vomit: Secondary | ICD-10-CM | POA: Diagnosis not present

## 2019-02-28 DIAGNOSIS — Z7951 Long term (current) use of inhaled steroids: Secondary | ICD-10-CM | POA: Diagnosis not present

## 2019-02-28 DIAGNOSIS — D509 Iron deficiency anemia, unspecified: Secondary | ICD-10-CM | POA: Diagnosis not present

## 2019-02-28 DIAGNOSIS — T17908D Unspecified foreign body in respiratory tract, part unspecified causing other injury, subsequent encounter: Secondary | ICD-10-CM | POA: Diagnosis not present

## 2019-02-28 DIAGNOSIS — L89313 Pressure ulcer of right buttock, stage 3: Secondary | ICD-10-CM | POA: Diagnosis not present

## 2019-02-28 DIAGNOSIS — J439 Emphysema, unspecified: Secondary | ICD-10-CM | POA: Diagnosis not present

## 2019-03-01 ENCOUNTER — Telehealth: Payer: Self-pay

## 2019-03-01 NOTE — Telephone Encounter (Signed)
Bruce OT with [facility] left v/m requesting verbal orders for [facility] OT 2 x a wk for 3 wks.Please advise.

## 2019-03-01 NOTE — Telephone Encounter (Signed)
Verbal order given to Bruce by telephone as instructed.

## 2019-03-01 NOTE — Telephone Encounter (Signed)
Please give the order.  Thanks.   

## 2019-03-02 DIAGNOSIS — J690 Pneumonitis due to inhalation of food and vomit: Secondary | ICD-10-CM | POA: Diagnosis not present

## 2019-03-02 DIAGNOSIS — D509 Iron deficiency anemia, unspecified: Secondary | ICD-10-CM | POA: Diagnosis not present

## 2019-03-02 DIAGNOSIS — T17908D Unspecified foreign body in respiratory tract, part unspecified causing other injury, subsequent encounter: Secondary | ICD-10-CM | POA: Diagnosis not present

## 2019-03-02 DIAGNOSIS — J439 Emphysema, unspecified: Secondary | ICD-10-CM | POA: Diagnosis not present

## 2019-03-02 DIAGNOSIS — Z4800 Encounter for change or removal of nonsurgical wound dressing: Secondary | ICD-10-CM | POA: Diagnosis not present

## 2019-03-02 DIAGNOSIS — L89313 Pressure ulcer of right buttock, stage 3: Secondary | ICD-10-CM | POA: Diagnosis not present

## 2019-03-02 DIAGNOSIS — Z7951 Long term (current) use of inhaled steroids: Secondary | ICD-10-CM | POA: Diagnosis not present

## 2019-03-02 NOTE — Telephone Encounter (Signed)
Left detailed message on voicemail of [name] [name].

## 2019-03-02 NOTE — Telephone Encounter (Signed)
Amy PT with [facility] left v/m requesting status of request for verbal orders for [facility] PT; Amy request cb.

## 2019-03-02 NOTE — Telephone Encounter (Signed)
Patient's Daughter called in regards to Amy calling yesterday. She stated that Amy stated she has not heard from our office about verbal orders

## 2019-03-03 ENCOUNTER — Other Ambulatory Visit: Payer: Self-pay | Admitting: *Deleted

## 2019-03-03 DIAGNOSIS — J439 Emphysema, unspecified: Secondary | ICD-10-CM | POA: Diagnosis not present

## 2019-03-03 DIAGNOSIS — Z4800 Encounter for change or removal of nonsurgical wound dressing: Secondary | ICD-10-CM | POA: Diagnosis not present

## 2019-03-03 DIAGNOSIS — Z7951 Long term (current) use of inhaled steroids: Secondary | ICD-10-CM | POA: Diagnosis not present

## 2019-03-03 DIAGNOSIS — T17908D Unspecified foreign body in respiratory tract, part unspecified causing other injury, subsequent encounter: Secondary | ICD-10-CM | POA: Diagnosis not present

## 2019-03-03 DIAGNOSIS — J690 Pneumonitis due to inhalation of food and vomit: Secondary | ICD-10-CM | POA: Diagnosis not present

## 2019-03-03 DIAGNOSIS — L89313 Pressure ulcer of right buttock, stage 3: Secondary | ICD-10-CM | POA: Diagnosis not present

## 2019-03-03 DIAGNOSIS — D509 Iron deficiency anemia, unspecified: Secondary | ICD-10-CM | POA: Diagnosis not present

## 2019-03-03 MED ORDER — LEVOTHYROXINE SODIUM 100 MCG PO TABS: 100.0000 ug | ORAL_TABLET | Freq: Every day | ORAL | 1 refills | Status: DC

## 2019-03-04 DIAGNOSIS — T17908D Unspecified foreign body in respiratory tract, part unspecified causing other injury, subsequent encounter: Secondary | ICD-10-CM | POA: Diagnosis not present

## 2019-03-04 DIAGNOSIS — D509 Iron deficiency anemia, unspecified: Secondary | ICD-10-CM | POA: Diagnosis not present

## 2019-03-04 DIAGNOSIS — Z4800 Encounter for change or removal of nonsurgical wound dressing: Secondary | ICD-10-CM | POA: Diagnosis not present

## 2019-03-04 DIAGNOSIS — L89313 Pressure ulcer of right buttock, stage 3: Secondary | ICD-10-CM | POA: Diagnosis not present

## 2019-03-04 DIAGNOSIS — J439 Emphysema, unspecified: Secondary | ICD-10-CM | POA: Diagnosis not present

## 2019-03-04 DIAGNOSIS — Z7951 Long term (current) use of inhaled steroids: Secondary | ICD-10-CM | POA: Diagnosis not present

## 2019-03-04 DIAGNOSIS — J690 Pneumonitis due to inhalation of food and vomit: Secondary | ICD-10-CM | POA: Diagnosis not present

## 2019-03-05 DIAGNOSIS — J449 Chronic obstructive pulmonary disease, unspecified: Secondary | ICD-10-CM | POA: Diagnosis not present

## 2019-03-06 DIAGNOSIS — D509 Iron deficiency anemia, unspecified: Secondary | ICD-10-CM | POA: Diagnosis not present

## 2019-03-06 DIAGNOSIS — J690 Pneumonitis due to inhalation of food and vomit: Secondary | ICD-10-CM | POA: Diagnosis not present

## 2019-03-06 DIAGNOSIS — J439 Emphysema, unspecified: Secondary | ICD-10-CM | POA: Diagnosis not present

## 2019-03-06 DIAGNOSIS — L89313 Pressure ulcer of right buttock, stage 3: Secondary | ICD-10-CM | POA: Diagnosis not present

## 2019-03-06 DIAGNOSIS — Z7951 Long term (current) use of inhaled steroids: Secondary | ICD-10-CM | POA: Diagnosis not present

## 2019-03-06 DIAGNOSIS — T17908D Unspecified foreign body in respiratory tract, part unspecified causing other injury, subsequent encounter: Secondary | ICD-10-CM | POA: Diagnosis not present

## 2019-03-06 DIAGNOSIS — Z4800 Encounter for change or removal of nonsurgical wound dressing: Secondary | ICD-10-CM | POA: Diagnosis not present

## 2019-03-07 ENCOUNTER — Telehealth: Payer: Self-pay

## 2019-03-07 DIAGNOSIS — L89313 Pressure ulcer of right buttock, stage 3: Secondary | ICD-10-CM | POA: Diagnosis not present

## 2019-03-07 DIAGNOSIS — M7989 Other specified soft tissue disorders: Secondary | ICD-10-CM

## 2019-03-07 DIAGNOSIS — D509 Iron deficiency anemia, unspecified: Secondary | ICD-10-CM | POA: Diagnosis not present

## 2019-03-07 DIAGNOSIS — J439 Emphysema, unspecified: Secondary | ICD-10-CM | POA: Diagnosis not present

## 2019-03-07 DIAGNOSIS — T17908D Unspecified foreign body in respiratory tract, part unspecified causing other injury, subsequent encounter: Secondary | ICD-10-CM | POA: Diagnosis not present

## 2019-03-07 DIAGNOSIS — Z4800 Encounter for change or removal of nonsurgical wound dressing: Secondary | ICD-10-CM | POA: Diagnosis not present

## 2019-03-07 DIAGNOSIS — Z7951 Long term (current) use of inhaled steroids: Secondary | ICD-10-CM | POA: Diagnosis not present

## 2019-03-07 DIAGNOSIS — J690 Pneumonitis due to inhalation of food and vomit: Secondary | ICD-10-CM | POA: Diagnosis not present

## 2019-03-07 NOTE — Telephone Encounter (Addendum)
I talked with [name] with home health and I appreciate her help.   Patient has torsemide to use daily prn for edema.  It makes sense to continue prn use of that.   He has L leg edema, discussed getting u/s done.  I would prefer not to do this via ER tonight given covid considerations.  Will ask for u/s to be done tomorrow AM.  Ordered.   If more SOB, purulent sputum, or if a fever in the meantime, then I need them to update [place].   [name] [name] will pass message along to family.  I thank all involved.

## 2019-03-07 NOTE — Telephone Encounter (Signed)
[name] nurse with [facility] left v/m; she was out seeing pt today and pts caregiver said pt having more swelling in left leg for a few days. Pt was given lasix last night and some swelling has gone done; pt has 1+ edema from ankle to mid shin. New bronchi in lung fields; worse on rt side; little clearing with coughing but still present after coughing. Pt having little more SOB the last 2 days. Pt continues with lt leg pain but that has been going on for awhile. [name] request any verbal orders to do anything different or any verbal orders for additional nursing care in home.Please advise.

## 2019-03-07 NOTE — Telephone Encounter (Signed)
Spoke with [name] with PT who says that the patient is showing improvement but the family says he does much better when PT is there than with the family members.  I asked if the family members were aware of the commands, etc that they use and she says the daughter and husband are there for the PT visits and seem to do well with him.  [name] says she really couldn't think of any other ideas other than continued PT.

## 2019-03-07 NOTE — Telephone Encounter (Signed)
[name] [facility] PT [facility] left v/m reporting a fall for pt; [name] said pts daughter said during a tranfer pt lost balance and she slid pt to the floor. No known injury.No cb needed unless PCP wants to speak with [facility] PT.

## 2019-03-07 NOTE — Telephone Encounter (Signed)
If they have any ideas (other than continued PT) to reduce his fall risk, then I am open to all suggestions.  Thanks.

## 2019-03-08 ENCOUNTER — Ambulatory Visit: Payer: Medicare Other

## 2019-03-08 ENCOUNTER — Other Ambulatory Visit: Payer: Self-pay

## 2019-03-08 DIAGNOSIS — R600 Localized edema: Secondary | ICD-10-CM | POA: Diagnosis not present

## 2019-03-08 DIAGNOSIS — M7989 Other specified soft tissue disorders: Secondary | ICD-10-CM

## 2019-03-08 NOTE — Telephone Encounter (Signed)
Noted. Thanks.

## 2019-03-09 DIAGNOSIS — J690 Pneumonitis due to inhalation of food and vomit: Secondary | ICD-10-CM | POA: Diagnosis not present

## 2019-03-09 DIAGNOSIS — L89313 Pressure ulcer of right buttock, stage 3: Secondary | ICD-10-CM | POA: Diagnosis not present

## 2019-03-09 DIAGNOSIS — J439 Emphysema, unspecified: Secondary | ICD-10-CM | POA: Diagnosis not present

## 2019-03-09 DIAGNOSIS — Z4800 Encounter for change or removal of nonsurgical wound dressing: Secondary | ICD-10-CM | POA: Diagnosis not present

## 2019-03-09 DIAGNOSIS — D509 Iron deficiency anemia, unspecified: Secondary | ICD-10-CM | POA: Diagnosis not present

## 2019-03-09 DIAGNOSIS — T17908D Unspecified foreign body in respiratory tract, part unspecified causing other injury, subsequent encounter: Secondary | ICD-10-CM | POA: Diagnosis not present

## 2019-03-09 DIAGNOSIS — Z7951 Long term (current) use of inhaled steroids: Secondary | ICD-10-CM | POA: Diagnosis not present

## 2019-03-10 DIAGNOSIS — J439 Emphysema, unspecified: Secondary | ICD-10-CM | POA: Diagnosis not present

## 2019-03-10 DIAGNOSIS — L89313 Pressure ulcer of right buttock, stage 3: Secondary | ICD-10-CM | POA: Diagnosis not present

## 2019-03-10 DIAGNOSIS — J690 Pneumonitis due to inhalation of food and vomit: Secondary | ICD-10-CM | POA: Diagnosis not present

## 2019-03-10 DIAGNOSIS — Z4800 Encounter for change or removal of nonsurgical wound dressing: Secondary | ICD-10-CM | POA: Diagnosis not present

## 2019-03-10 DIAGNOSIS — Z7951 Long term (current) use of inhaled steroids: Secondary | ICD-10-CM | POA: Diagnosis not present

## 2019-03-10 DIAGNOSIS — D509 Iron deficiency anemia, unspecified: Secondary | ICD-10-CM | POA: Diagnosis not present

## 2019-03-10 DIAGNOSIS — T17908D Unspecified foreign body in respiratory tract, part unspecified causing other injury, subsequent encounter: Secondary | ICD-10-CM | POA: Diagnosis not present

## 2019-03-14 ENCOUNTER — Telehealth: Payer: Self-pay

## 2019-03-14 DIAGNOSIS — T17908D Unspecified foreign body in respiratory tract, part unspecified causing other injury, subsequent encounter: Secondary | ICD-10-CM | POA: Diagnosis not present

## 2019-03-14 DIAGNOSIS — D509 Iron deficiency anemia, unspecified: Secondary | ICD-10-CM | POA: Diagnosis not present

## 2019-03-14 DIAGNOSIS — J690 Pneumonitis due to inhalation of food and vomit: Secondary | ICD-10-CM | POA: Diagnosis not present

## 2019-03-14 DIAGNOSIS — Z7951 Long term (current) use of inhaled steroids: Secondary | ICD-10-CM | POA: Diagnosis not present

## 2019-03-14 DIAGNOSIS — J439 Emphysema, unspecified: Secondary | ICD-10-CM | POA: Diagnosis not present

## 2019-03-14 DIAGNOSIS — Z4800 Encounter for change or removal of nonsurgical wound dressing: Secondary | ICD-10-CM | POA: Diagnosis not present

## 2019-03-14 DIAGNOSIS — L89313 Pressure ulcer of right buttock, stage 3: Secondary | ICD-10-CM | POA: Diagnosis not present

## 2019-03-14 MED ORDER — AMOXICILLIN-POT CLAVULANATE 875-125 MG PO TABS: 1.0000 | ORAL_TABLET | Freq: Two times a day (BID) | ORAL | 0 refills | Status: DC

## 2019-03-14 NOTE — Telephone Encounter (Signed)
[name] Nurse with [facility] left v/m that she saw pt today. There is quite a bit of bronchi on right posterior lung field top and bottom;dimenished on left side; pt had little bit last wk but is worse this week. No change in cough; continues with prod cough with clear thick phlegm; per care giver pt is weaker last 2 days. Pt does not have more SOB than usual, no fever and BP earlier was 158/70 something. The leg edema is almost gone; slight puffiness on left ankle and pt has not had fluid pill last couple of days. Eustaquio Maize is sure pt is aspirating because of what pt is eating. [name] request cb or can call pt after Dr Damita Dunnings reviews note.

## 2019-03-14 NOTE — Telephone Encounter (Signed)
I would start augmentin, rx sent.   Would not change prev order for zithromax.  Have them work on aspiration precautions and update me in a few days about his situation, sooner if needed.  Thanks.

## 2019-03-14 NOTE — Telephone Encounter (Signed)
[name] [name] with [facility] advised.

## 2019-03-15 DIAGNOSIS — Z4800 Encounter for change or removal of nonsurgical wound dressing: Secondary | ICD-10-CM | POA: Diagnosis not present

## 2019-03-15 DIAGNOSIS — Z7951 Long term (current) use of inhaled steroids: Secondary | ICD-10-CM | POA: Diagnosis not present

## 2019-03-15 DIAGNOSIS — L89313 Pressure ulcer of right buttock, stage 3: Secondary | ICD-10-CM | POA: Diagnosis not present

## 2019-03-15 DIAGNOSIS — T17908D Unspecified foreign body in respiratory tract, part unspecified causing other injury, subsequent encounter: Secondary | ICD-10-CM | POA: Diagnosis not present

## 2019-03-15 DIAGNOSIS — J439 Emphysema, unspecified: Secondary | ICD-10-CM | POA: Diagnosis not present

## 2019-03-15 DIAGNOSIS — D509 Iron deficiency anemia, unspecified: Secondary | ICD-10-CM | POA: Diagnosis not present

## 2019-03-15 DIAGNOSIS — J690 Pneumonitis due to inhalation of food and vomit: Secondary | ICD-10-CM | POA: Diagnosis not present

## 2019-03-16 ENCOUNTER — Telehealth: Payer: Self-pay

## 2019-03-16 DIAGNOSIS — Z4800 Encounter for change or removal of nonsurgical wound dressing: Secondary | ICD-10-CM | POA: Diagnosis not present

## 2019-03-16 DIAGNOSIS — T17908D Unspecified foreign body in respiratory tract, part unspecified causing other injury, subsequent encounter: Secondary | ICD-10-CM | POA: Diagnosis not present

## 2019-03-16 DIAGNOSIS — L89313 Pressure ulcer of right buttock, stage 3: Secondary | ICD-10-CM | POA: Diagnosis not present

## 2019-03-16 DIAGNOSIS — D509 Iron deficiency anemia, unspecified: Secondary | ICD-10-CM | POA: Diagnosis not present

## 2019-03-16 DIAGNOSIS — J690 Pneumonitis due to inhalation of food and vomit: Secondary | ICD-10-CM | POA: Diagnosis not present

## 2019-03-16 DIAGNOSIS — Z7951 Long term (current) use of inhaled steroids: Secondary | ICD-10-CM | POA: Diagnosis not present

## 2019-03-16 DIAGNOSIS — J439 Emphysema, unspecified: Secondary | ICD-10-CM | POA: Diagnosis not present

## 2019-03-16 NOTE — Telephone Encounter (Signed)
[name] [name] (DPR signed) left v/m that pt has difficulty swallowing large pills and [name] [name] spoke with pharmacist who advised could cut Augmentin into quarters and see if pt could swallow that. [name] [name] will try that and if pt still has problem swallowing quarters of augmentin [name] [name] will cb for liquid medicine.  [name] [name] ask in the future if a pill is large to please prescribe a liquid form of med. [name] [name] also requested to change pts pharmacy from [place] to [place] on [address]. Pharmacy has already been updated. FYI to Dr Damita Dunnings.

## 2019-03-16 NOTE — Telephone Encounter (Signed)
Noted. Thanks.

## 2019-03-17 DIAGNOSIS — T17908D Unspecified foreign body in respiratory tract, part unspecified causing other injury, subsequent encounter: Secondary | ICD-10-CM | POA: Diagnosis not present

## 2019-03-17 DIAGNOSIS — L89313 Pressure ulcer of right buttock, stage 3: Secondary | ICD-10-CM | POA: Diagnosis not present

## 2019-03-17 DIAGNOSIS — J690 Pneumonitis due to inhalation of food and vomit: Secondary | ICD-10-CM | POA: Diagnosis not present

## 2019-03-17 DIAGNOSIS — Z7951 Long term (current) use of inhaled steroids: Secondary | ICD-10-CM | POA: Diagnosis not present

## 2019-03-17 DIAGNOSIS — Z4800 Encounter for change or removal of nonsurgical wound dressing: Secondary | ICD-10-CM | POA: Diagnosis not present

## 2019-03-17 DIAGNOSIS — D509 Iron deficiency anemia, unspecified: Secondary | ICD-10-CM | POA: Diagnosis not present

## 2019-03-17 DIAGNOSIS — J439 Emphysema, unspecified: Secondary | ICD-10-CM | POA: Diagnosis not present

## 2019-03-19 DIAGNOSIS — G9340 Encephalopathy, unspecified: Secondary | ICD-10-CM | POA: Diagnosis not present

## 2019-03-19 DIAGNOSIS — R410 Disorientation, unspecified: Secondary | ICD-10-CM | POA: Diagnosis not present

## 2019-03-19 DIAGNOSIS — E43 Unspecified severe protein-calorie malnutrition: Secondary | ICD-10-CM | POA: Diagnosis not present

## 2019-03-21 DIAGNOSIS — Z7951 Long term (current) use of inhaled steroids: Secondary | ICD-10-CM | POA: Diagnosis not present

## 2019-03-21 DIAGNOSIS — Z4800 Encounter for change or removal of nonsurgical wound dressing: Secondary | ICD-10-CM | POA: Diagnosis not present

## 2019-03-21 DIAGNOSIS — D509 Iron deficiency anemia, unspecified: Secondary | ICD-10-CM | POA: Diagnosis not present

## 2019-03-21 DIAGNOSIS — J439 Emphysema, unspecified: Secondary | ICD-10-CM | POA: Diagnosis not present

## 2019-03-21 DIAGNOSIS — T17908D Unspecified foreign body in respiratory tract, part unspecified causing other injury, subsequent encounter: Secondary | ICD-10-CM | POA: Diagnosis not present

## 2019-03-21 DIAGNOSIS — L89313 Pressure ulcer of right buttock, stage 3: Secondary | ICD-10-CM | POA: Diagnosis not present

## 2019-03-21 DIAGNOSIS — J690 Pneumonitis due to inhalation of food and vomit: Secondary | ICD-10-CM | POA: Diagnosis not present

## 2019-03-22 DIAGNOSIS — Z4800 Encounter for change or removal of nonsurgical wound dressing: Secondary | ICD-10-CM | POA: Diagnosis not present

## 2019-03-22 DIAGNOSIS — D509 Iron deficiency anemia, unspecified: Secondary | ICD-10-CM | POA: Diagnosis not present

## 2019-03-22 DIAGNOSIS — J439 Emphysema, unspecified: Secondary | ICD-10-CM | POA: Diagnosis not present

## 2019-03-22 DIAGNOSIS — Z7951 Long term (current) use of inhaled steroids: Secondary | ICD-10-CM | POA: Diagnosis not present

## 2019-03-22 DIAGNOSIS — J690 Pneumonitis due to inhalation of food and vomit: Secondary | ICD-10-CM | POA: Diagnosis not present

## 2019-03-22 DIAGNOSIS — T17908D Unspecified foreign body in respiratory tract, part unspecified causing other injury, subsequent encounter: Secondary | ICD-10-CM | POA: Diagnosis not present

## 2019-03-22 DIAGNOSIS — L89313 Pressure ulcer of right buttock, stage 3: Secondary | ICD-10-CM | POA: Diagnosis not present

## 2019-03-23 DIAGNOSIS — J690 Pneumonitis due to inhalation of food and vomit: Secondary | ICD-10-CM | POA: Diagnosis not present

## 2019-03-23 DIAGNOSIS — Z7951 Long term (current) use of inhaled steroids: Secondary | ICD-10-CM | POA: Diagnosis not present

## 2019-03-23 DIAGNOSIS — L89313 Pressure ulcer of right buttock, stage 3: Secondary | ICD-10-CM | POA: Diagnosis not present

## 2019-03-23 DIAGNOSIS — D509 Iron deficiency anemia, unspecified: Secondary | ICD-10-CM | POA: Diagnosis not present

## 2019-03-23 DIAGNOSIS — T17908D Unspecified foreign body in respiratory tract, part unspecified causing other injury, subsequent encounter: Secondary | ICD-10-CM | POA: Diagnosis not present

## 2019-03-23 DIAGNOSIS — Z4800 Encounter for change or removal of nonsurgical wound dressing: Secondary | ICD-10-CM | POA: Diagnosis not present

## 2019-03-23 DIAGNOSIS — J439 Emphysema, unspecified: Secondary | ICD-10-CM | POA: Diagnosis not present

## 2019-03-26 DIAGNOSIS — I5189 Other ill-defined heart diseases: Secondary | ICD-10-CM | POA: Diagnosis not present

## 2019-03-26 DIAGNOSIS — J439 Emphysema, unspecified: Secondary | ICD-10-CM | POA: Diagnosis not present

## 2019-03-26 DIAGNOSIS — J449 Chronic obstructive pulmonary disease, unspecified: Secondary | ICD-10-CM | POA: Diagnosis not present

## 2019-03-26 DIAGNOSIS — J9611 Chronic respiratory failure with hypoxia: Secondary | ICD-10-CM | POA: Diagnosis not present

## 2019-03-28 ENCOUNTER — Telehealth: Payer: Self-pay

## 2019-03-28 NOTE — Telephone Encounter (Signed)
[name] nurse with [facility] left v/m that she was not working last wk and the nurse that saw pt noted pt was having more congestion and pt's orders run out this week and [name] wants to know if could get verbal orders to extend pts [place] orders if nurse that sees pt this week thinks pt needs to continue with nursing [place] orders. [name] request cb.

## 2019-03-29 NOTE — Telephone Encounter (Signed)
Please give the order.  Thanks.   

## 2019-03-29 NOTE — Telephone Encounter (Signed)
[name] given verbal orders to continue home health.

## 2019-03-30 ENCOUNTER — Telehealth: Payer: Self-pay | Admitting: Adult Health

## 2019-03-30 MED ORDER — FLUTICASONE-UMECLIDIN-VILANT 100-62.5-25 MCG/INH IN AEPB: 1.0000 | INHALATION_SPRAY | Freq: Every day | RESPIRATORY_TRACT | 5 refills | Status: AC

## 2019-03-30 NOTE — Telephone Encounter (Signed)
Called spoke with [name].  I let her know we could send in the trelegy to new pharmacy in [place].  I saw patient was a BQ patient with out a follow up, got patient scheduled for Mychart visit with TP to establish new provider.  Nothing further needed.

## 2019-03-31 ENCOUNTER — Telehealth: Payer: Self-pay

## 2019-03-31 DIAGNOSIS — D509 Iron deficiency anemia, unspecified: Secondary | ICD-10-CM | POA: Diagnosis not present

## 2019-03-31 DIAGNOSIS — J439 Emphysema, unspecified: Secondary | ICD-10-CM | POA: Diagnosis not present

## 2019-03-31 DIAGNOSIS — J690 Pneumonitis due to inhalation of food and vomit: Secondary | ICD-10-CM | POA: Diagnosis not present

## 2019-03-31 DIAGNOSIS — L89313 Pressure ulcer of right buttock, stage 3: Secondary | ICD-10-CM | POA: Diagnosis not present

## 2019-03-31 DIAGNOSIS — T17908D Unspecified foreign body in respiratory tract, part unspecified causing other injury, subsequent encounter: Secondary | ICD-10-CM | POA: Diagnosis not present

## 2019-03-31 DIAGNOSIS — Z7951 Long term (current) use of inhaled steroids: Secondary | ICD-10-CM | POA: Diagnosis not present

## 2019-03-31 DIAGNOSIS — Z4800 Encounter for change or removal of nonsurgical wound dressing: Secondary | ICD-10-CM | POA: Diagnosis not present

## 2019-03-31 NOTE — Telephone Encounter (Signed)
[name] [name] nurse with [facility] requesting verbal orders for [name] nursing for 1 x a wk for 2 wks due to pt having congestion in base of lungs; pts family is doing neb treatments. And following a stage 2 pressure sore on lt buttocks.Please advise.

## 2019-03-31 NOTE — Telephone Encounter (Signed)
Please give the order.  If they don't have standing order for pressure sore treatment then let me know.  Thanks.

## 2019-03-31 NOTE — Telephone Encounter (Signed)
Left message for [name] [name] to call back.

## 2019-04-03 ENCOUNTER — Ambulatory Visit (INDEPENDENT_AMBULATORY_CARE_PROVIDER_SITE_OTHER): Payer: Medicare Other | Admitting: Adult Health

## 2019-04-03 ENCOUNTER — Other Ambulatory Visit: Payer: Self-pay

## 2019-04-03 ENCOUNTER — Encounter: Payer: Self-pay | Admitting: Adult Health

## 2019-04-03 DIAGNOSIS — J439 Emphysema, unspecified: Secondary | ICD-10-CM | POA: Diagnosis not present

## 2019-04-03 DIAGNOSIS — I5032 Chronic diastolic (congestive) heart failure: Secondary | ICD-10-CM

## 2019-04-03 NOTE — Progress Notes (Signed)
PCCM: happy to establish care Garner Nash, DO [facility] 04/03/2019 5:25 PM

## 2019-04-03 NOTE — Patient Instructions (Addendum)
Severe COPD with recurrent exacerbations Continue daily azithromycin Continue Trelegy 1 puff daily Use albuterol as needed for chest tightness wheezing or shortness of breath, it is also okay to use this if there is excessive rattling in his chest Practice good hand hygiene Stay active Use Flutter valve Three times a day   Aspiration precautions Follow up with Dr. Valeta Harms in 3 months and As needed   Please contact office for sooner follow up if symptoms do not improve or worsen or seek emergency care

## 2019-04-03 NOTE — Progress Notes (Signed)
Virtual Visit via Telephone Note  I connected with [name] [name] Sr. on 04/03/19 at  3:00 PM EDT by telephone and verified that I am speaking with the correct person using two identifiers.  Location: Patient: Home  Provider: Office    I discussed the limitations, risks, security and privacy concerns of performing an evaluation and management service by telephone and the availability of in person appointments. I also discussed with the patient that there may be a patient responsible charge related to this service. The patient expressed understanding and agreed to proceed.   History of Present Illness: 85 year-old male former smoker followed for emphysema/COPD , Diastolic CHF, Prone to recurrent aspiration   Today's televisit is for a follow up for COPD . Visit with with patient and daughter [name] [name] at home , his current caregiver.   Patient has underlying COPD and is prone to recurrent exacerbations, Pseudomonas colonization and history of recurrent aspiration.  He remains on Trelegy daily.  He uses flutter valve.  Has albuterol nebulizer treatments as needed.  Is also on daily azithromycin.   Says overall breathing is doing the same.  Has had increased congestion over the last couple weeks.  Was given a 1 week course of Augmentin by primary care physician.  Seems to have helped some but continues to have daily cough with thick mucus.  Appetite has been fair.  No increased confusion.  Denies any fever, chest pain orthopnea.  Was having some left lower leg swelling.  Primary care provider ordered a venous Doppler was negative for DVT last month.    Observations/Objective: Synopsis: former patient of Dr. Gwenette Greet with COPD Smoked cigarettes for many years, from age 70 to age 23 1.5 ppd CXR/CT chest 2014: Emphysematous changes with mild scarring, +dilated esophagus with A/F level.  PFT"s 08/2012: Normal FEV1%, but FVC improved 17% with BD. +++ airtrapping/ +FVL, no restriction, DLCO normal   Echo 09/2012: Normal LV EF, diastolic dysfxn, mildly dilated LA, RV normal.  CT angio 09/2012: No PE, small nodule posterior LUL, GG anterior lingula ?significance, scattered areas of atx. No significant emphysematous changes or ISLD 07/2015 Pseudomonas (pan sensitive )  Sputum cx and AFB neg on 12/23/15 . Swallow eval October 2019 moderate oral and severe pharyngeal dysphagia resulting aspiration of thin, nectar, honey consistencies Chest x-ray May 2020 right basilar scarring no acute abnormality noted  Assessment and Plan: Severe COPD with recurrent exacerbations prone to recurrent aspiration.  Appears to be somewhat stable.  We will continue on maximum regimen with Trelegy.  Encouraged on flutter valve and nebulizer use to mobilize mucus. Patient is on daily azithromycin.  Has upcoming labs at his primary care physician. Could consider on return decrease in this to 3 days a week.  Diastolic heart failure.  Does not sound like he is overtly overloaded.  Seems to be stable.  Dysphagia encouraged on aspiration precautions.  Plan  Patient Instructions  Severe COPD with recurrent exacerbations Continue daily azithromycin Continue Trelegy 1 puff daily Use albuterol as needed for chest tightness wheezing or shortness of breath, it is also okay to use this if there is excessive rattling in his chest Practice good hand hygiene Stay active Use Flutter valve Three times a day   Aspiration precautions Follow up with Dr. Valeta Harms in 3 months and As needed   Please contact office for sooner follow up if symptoms do not improve or worsen or seek emergency care         Follow Up Instructions: Follow-up  in 3 months and as needed   I discussed the assessment and treatment plan with the patient. The patient was provided an opportunity to ask questions and all were answered. The patient agreed with the plan and demonstrated an understanding of the instructions.   The patient was advised to call  back or seek an in-person evaluation if the symptoms worsen or if the condition fails to improve as anticipated.  I provided  22 minutes of non-face-to-face time during this encounter.   Rexene Edison, NP

## 2019-04-03 NOTE — Telephone Encounter (Signed)
[name] [name] with [facility] left v/m requesting cb about verbal orders for [facility] nursing order 1 x a wk for 2 wks to monitor pneumonitis. [name] [name] request cb.

## 2019-04-03 NOTE — Telephone Encounter (Signed)
Left detailed message on voicemail of Verdis Frederickson with [facility]

## 2019-04-04 DIAGNOSIS — J449 Chronic obstructive pulmonary disease, unspecified: Secondary | ICD-10-CM | POA: Diagnosis not present

## 2019-04-06 ENCOUNTER — Telehealth: Payer: Self-pay

## 2019-04-06 DIAGNOSIS — Z4800 Encounter for change or removal of nonsurgical wound dressing: Secondary | ICD-10-CM | POA: Diagnosis not present

## 2019-04-06 DIAGNOSIS — J439 Emphysema, unspecified: Secondary | ICD-10-CM | POA: Diagnosis not present

## 2019-04-06 DIAGNOSIS — J690 Pneumonitis due to inhalation of food and vomit: Secondary | ICD-10-CM | POA: Diagnosis not present

## 2019-04-06 DIAGNOSIS — D509 Iron deficiency anemia, unspecified: Secondary | ICD-10-CM | POA: Diagnosis not present

## 2019-04-06 DIAGNOSIS — L89313 Pressure ulcer of right buttock, stage 3: Secondary | ICD-10-CM | POA: Diagnosis not present

## 2019-04-06 DIAGNOSIS — T17908D Unspecified foreign body in respiratory tract, part unspecified causing other injury, subsequent encounter: Secondary | ICD-10-CM | POA: Diagnosis not present

## 2019-04-06 DIAGNOSIS — Z7951 Long term (current) use of inhaled steroids: Secondary | ICD-10-CM | POA: Diagnosis not present

## 2019-04-06 NOTE — Telephone Encounter (Signed)
[name] [name] advised and says patient is still using flutter valve and that she is going to try to get the CXR done early in the AM and if so, results should be available by mid afternoon tomorrow.

## 2019-04-06 NOTE — Telephone Encounter (Signed)
Please give the order for the CXR but see when they will get that resulted.  We may need to treat presumptively in the meantime.  Make sure he is still using flutter valve.  Thanks.

## 2019-04-06 NOTE — Telephone Encounter (Signed)
Otila Kluver nurse with [facility] left v/m that pt has congestion in all lobes of his lungs and prod cough with thick tan phlegm at times and Otila Kluver wants to know if can get order for CRX in the home to rule out pneumonia. Temp today 99.1 and O2 sat was 92 -95 %. [place] request cb.

## 2019-04-07 DIAGNOSIS — R509 Fever, unspecified: Secondary | ICD-10-CM | POA: Diagnosis not present

## 2019-04-07 DIAGNOSIS — J811 Chronic pulmonary edema: Secondary | ICD-10-CM | POA: Diagnosis not present

## 2019-04-07 MED ORDER — AMOXICILLIN-POT CLAVULANATE 875-125 MG PO TABS: 1.0000 | ORAL_TABLET | Freq: Two times a day (BID) | ORAL | 0 refills | Status: DC

## 2019-04-07 NOTE — Telephone Encounter (Signed)
No sign of PNA on cxr per MD note , unable to see.  If he is getting worse with increased cough and congestion , that is fine to begin Augmentin as directed.   Follow up with pulmonary as directed.  Please contact office for sooner follow up if symptoms do not improve or worsen or seek emergency care

## 2019-04-07 NOTE — Telephone Encounter (Signed)
Spoke with daughter [name]) who says that she would like to wait to hear from pulmonary before starting the ABX because they have been told that pulmonary is trying to avoid ABX if at all possible so that it will be more effective for issues when he aspirates.  Daughter also says this would need to be liquid because they have had a terrible time getting his medications in him.  Daughter says she will await a call from pulmonary today or will call us on Monday.

## 2019-04-07 NOTE — Telephone Encounter (Signed)
[name], patient's daughter, l/m on triage voicemail stating that [facility] did chest xrays today-this morning and asked when results come in to have these shared with [facility] pulmonology office also so both offices can come up with a plan for the patient. Sharon's CB is [phone].

## 2019-04-07 NOTE — Telephone Encounter (Signed)
Tina with [facility] left v/m that xray report was sent to pulmonologist per daughter's request and pulmonologist is to compare with other xrays. Renata Caprice said any questions to call pulmonologist or pts daughter.

## 2019-04-07 NOTE — Telephone Encounter (Signed)
Called [name] [name] with [facility] and she is going to check with her supervisor to see if a report can be sent.

## 2019-04-07 NOTE — Telephone Encounter (Signed)
Spoke with [name] [name]. Advised her of TP's recs. She verbalized understanding. She stated that she will hold off on the abx for now since he just finished a round about a week ago. She will see how he does over the weekend and will call Dr. Josefine Class office on Monday.   Nothing further needed.

## 2019-04-07 NOTE — Telephone Encounter (Signed)
With the inc in congestion and sputum, I would start augmentin.  He doesn't have PNA on CXR but does have atelectasis and old chronic changes.  Would continue other baseline meds in the meantime.  Please update me about his condition next week.  I routed this to pulmonary in the meantime.  Thanks.

## 2019-04-07 NOTE — Telephone Encounter (Signed)
Please check to see if these results come in this afternoon.  If for some reason they are not able to get the results this afternoon, please let me know.  Thanks.

## 2019-04-09 NOTE — Telephone Encounter (Signed)
Noted. Thanks.  Will await update.   

## 2019-04-14 DIAGNOSIS — D509 Iron deficiency anemia, unspecified: Secondary | ICD-10-CM | POA: Diagnosis not present

## 2019-04-14 DIAGNOSIS — Z7951 Long term (current) use of inhaled steroids: Secondary | ICD-10-CM | POA: Diagnosis not present

## 2019-04-14 DIAGNOSIS — Z4800 Encounter for change or removal of nonsurgical wound dressing: Secondary | ICD-10-CM | POA: Diagnosis not present

## 2019-04-14 DIAGNOSIS — L89313 Pressure ulcer of right buttock, stage 3: Secondary | ICD-10-CM | POA: Diagnosis not present

## 2019-04-14 DIAGNOSIS — J690 Pneumonitis due to inhalation of food and vomit: Secondary | ICD-10-CM | POA: Diagnosis not present

## 2019-04-14 DIAGNOSIS — J439 Emphysema, unspecified: Secondary | ICD-10-CM | POA: Diagnosis not present

## 2019-04-14 DIAGNOSIS — T17908D Unspecified foreign body in respiratory tract, part unspecified causing other injury, subsequent encounter: Secondary | ICD-10-CM | POA: Diagnosis not present

## 2019-04-19 DIAGNOSIS — E43 Unspecified severe protein-calorie malnutrition: Secondary | ICD-10-CM | POA: Diagnosis not present

## 2019-04-19 DIAGNOSIS — G9340 Encephalopathy, unspecified: Secondary | ICD-10-CM | POA: Diagnosis not present

## 2019-04-19 DIAGNOSIS — L89313 Pressure ulcer of right buttock, stage 3: Secondary | ICD-10-CM | POA: Diagnosis not present

## 2019-04-19 DIAGNOSIS — J439 Emphysema, unspecified: Secondary | ICD-10-CM

## 2019-04-19 DIAGNOSIS — J690 Pneumonitis due to inhalation of food and vomit: Secondary | ICD-10-CM | POA: Diagnosis not present

## 2019-04-19 DIAGNOSIS — D509 Iron deficiency anemia, unspecified: Secondary | ICD-10-CM | POA: Diagnosis not present

## 2019-04-19 DIAGNOSIS — T17908D Unspecified foreign body in respiratory tract, part unspecified causing other injury, subsequent encounter: Secondary | ICD-10-CM | POA: Diagnosis not present

## 2019-04-19 DIAGNOSIS — R410 Disorientation, unspecified: Secondary | ICD-10-CM | POA: Diagnosis not present

## 2019-04-23 ENCOUNTER — Other Ambulatory Visit: Payer: Self-pay | Admitting: Pulmonary Disease

## 2019-04-27 ENCOUNTER — Ambulatory Visit: Payer: Medicare Other | Admitting: Family Medicine

## 2019-05-01 ENCOUNTER — Other Ambulatory Visit: Payer: Self-pay

## 2019-05-01 ENCOUNTER — Ambulatory Visit (INDEPENDENT_AMBULATORY_CARE_PROVIDER_SITE_OTHER): Payer: Medicare Other | Admitting: Family Medicine

## 2019-05-01 ENCOUNTER — Encounter: Payer: Self-pay | Admitting: Family Medicine

## 2019-05-01 VITALS — BP 132/68 | HR 62 | Temp 97.6°F | Ht 65.0 in

## 2019-05-01 DIAGNOSIS — I10 Essential (primary) hypertension: Secondary | ICD-10-CM

## 2019-05-01 DIAGNOSIS — R2681 Unsteadiness on feet: Secondary | ICD-10-CM | POA: Diagnosis not present

## 2019-05-01 DIAGNOSIS — Z23 Encounter for immunization: Secondary | ICD-10-CM

## 2019-05-01 DIAGNOSIS — L989 Disorder of the skin and subcutaneous tissue, unspecified: Secondary | ICD-10-CM | POA: Diagnosis not present

## 2019-05-01 DIAGNOSIS — L98429 Non-pressure chronic ulcer of back with unspecified severity: Secondary | ICD-10-CM

## 2019-05-01 DIAGNOSIS — D508 Other iron deficiency anemias: Secondary | ICD-10-CM | POA: Diagnosis not present

## 2019-05-01 MED ORDER — VERAPAMIL HCL 80 MG PO TABS: ORAL_TABLET | ORAL | Status: DC

## 2019-05-01 NOTE — Patient Instructions (Addendum)
We'll call about a skin clinic appointment.   I would try ice for your knee/Baker's cyst.   Keep working on leg exercises.   Go to the lab on the way out.  We'll contact you with your lab report. Please call in.  Thanks.

## 2019-05-01 NOTE — Progress Notes (Signed)
Here today with his daughter.   "Good appetite".  Asp cautions d/w pt.    H/o anemia and still on iron.  Due for follow-up labs.  See notes on labs.  No known bleeding.  Taking torsemide episodically, prn for BLE edema.  He has L > R ankle swelling at baseline, with h/o L Baker's cyst.  Family is elevating his legs at baseline.  Discussed.  He has been managing his sacral area w/o recent breakdown, using barrier cream.  PT helped with strength.  He is still working on home exercises.  He has a history of recurrent sacral irritation/breakdown exacerbated by prolonged sitting.  He is still picking at the L neck lesion at baseline.  L neck lesion 2x1cm now.  Discussed refer to derm. Prev chronically irritated with shaving.  It had previously been healing and was down to 2cm x 0.5cm at prior visit so we deferred intervention previously.  He has less exercise tolerance, with walking to the bathroom- that is about 30 steps.  Some sputum at baseline, grayish, not greenish.  Cough has been better this week.   Covid cautions discussed with patient.  PMH and SH reviewed  ROS: Per HPI unless specifically indicated in ROS section   Meds, vitals, and allergies reviewed.   GEN: nad, alert, in wheelchair, elderly male. HEENT:ncat NECK: supple w/o LA, left-sided neck lesion scabbed.  See above. CV: rrr.  PULM: He has a cough during the exam initially.  He initially had some bilateral lower lobe rhonchi but this clearly was improved after he coughed up some sputum that was clear.  His lungs were clear on recheck.  no inc wob ABD: soft, +bs EXT: trace BLE edema SKIN: no acute rash

## 2019-05-02 LAB — COMPREHENSIVE METABOLIC PANEL
ALT: 6 U/L (ref 0–53)
AST: 13 U/L (ref 0–37)
Albumin: 3.7 g/dL (ref 3.5–5.2)
Alkaline Phosphatase: 68 U/L (ref 39–117)
BUN: 24 mg/dL — ABNORMAL HIGH (ref 6–23)
CO2: 26 mEq/L (ref 19–32)
Calcium: 8.7 mg/dL (ref 8.4–10.5)
Chloride: 105 mEq/L (ref 96–112)
Creatinine, Ser: 1.02 mg/dL (ref 0.40–1.50)
GFR: 68.32 mL/min (ref >60.00)
Glucose, Bld: 96 mg/dL (ref 70–99)
Potassium: 4.2 mEq/L (ref 3.5–5.1)
Sodium: 138 mEq/L (ref 135–145)
Total Bilirubin: 0.4 mg/dL (ref 0.2–1.2)
Total Protein: 6.7 g/dL (ref 6.0–8.3)

## 2019-05-02 LAB — CBC WITH DIFFERENTIAL/PLATELET
Basophils Absolute: 0.1 10*3/uL (ref 0.0–0.1)
Basophils Relative: 1.2 % (ref 0.0–3.0)
Eosinophils Absolute: 0.3 10*3/uL (ref 0.0–0.7)
Eosinophils Relative: 4.5 % (ref 0.0–5.0)
HCT: 34.6 % — ABNORMAL LOW (ref 39.0–52.0)
Hemoglobin: 11.5 g/dL — ABNORMAL LOW (ref 13.0–17.0)
Lymphocytes Relative: 19 % (ref 12.0–46.0)
Lymphs Abs: 1.1 10*3/uL (ref 0.7–4.0)
MCHC: 33.2 g/dL (ref 30.0–36.0)
MCV: 94.7 fl (ref 78.0–100.0)
Monocytes Absolute: 0.5 10*3/uL (ref 0.1–1.0)
Monocytes Relative: 7.8 % (ref 3.0–12.0)
Neutro Abs: 4.1 10*3/uL (ref 1.4–7.7)
Neutrophils Relative %: 67.5 % (ref 43.0–77.0)
Platelets: 138 10*3/uL — ABNORMAL LOW (ref 150.0–400.0)
RBC: 3.65 Mil/uL — ABNORMAL LOW (ref 4.22–5.81)
RDW: 13.8 % (ref 11.5–15.5)
WBC: 6.1 10*3/uL (ref 4.0–10.5)

## 2019-05-02 LAB — IRON: Iron: 56 ug/dL (ref 42–165)

## 2019-05-03 ENCOUNTER — Encounter: Payer: Self-pay | Admitting: Family Medicine

## 2019-05-03 NOTE — Assessment & Plan Note (Signed)
With history of baseline lower extremity edema using torsemide as needed.  Discussed leg elevation.  Would continue as is otherwise.

## 2019-05-03 NOTE — Assessment & Plan Note (Signed)
He is still working on home exercises.  Fall cautions discussed.

## 2019-05-03 NOTE — Assessment & Plan Note (Signed)
Refer to dermatology.  This initially started when he was shaving and he cut the area and he has been picking at it in the meantime.  I need dermatology input on options for healing.

## 2019-05-03 NOTE — Assessment & Plan Note (Signed)
See notes on follow-up labs. >25 minutes spent in face to face time with patient, >50% spent in counselling or coordination of care

## 2019-05-03 NOTE — Assessment & Plan Note (Signed)
History of.  No breakdown now.

## 2019-05-05 DIAGNOSIS — J449 Chronic obstructive pulmonary disease, unspecified: Secondary | ICD-10-CM | POA: Diagnosis not present

## 2019-05-15 DIAGNOSIS — L820 Inflamed seborrheic keratosis: Secondary | ICD-10-CM | POA: Diagnosis not present

## 2019-05-15 DIAGNOSIS — L918 Other hypertrophic disorders of the skin: Secondary | ICD-10-CM | POA: Diagnosis not present

## 2019-05-19 DIAGNOSIS — G9340 Encephalopathy, unspecified: Secondary | ICD-10-CM | POA: Diagnosis not present

## 2019-05-19 DIAGNOSIS — R410 Disorientation, unspecified: Secondary | ICD-10-CM | POA: Diagnosis not present

## 2019-05-19 DIAGNOSIS — E43 Unspecified severe protein-calorie malnutrition: Secondary | ICD-10-CM | POA: Diagnosis not present

## 2019-05-20 ENCOUNTER — Other Ambulatory Visit: Payer: Self-pay | Admitting: Family Medicine

## 2019-05-24 ENCOUNTER — Other Ambulatory Visit: Payer: Self-pay | Admitting: Adult Health

## 2019-06-04 DIAGNOSIS — J449 Chronic obstructive pulmonary disease, unspecified: Secondary | ICD-10-CM | POA: Diagnosis not present

## 2019-06-12 DIAGNOSIS — C4441 Basal cell carcinoma of skin of scalp and neck: Secondary | ICD-10-CM | POA: Diagnosis not present

## 2019-06-14 DIAGNOSIS — I4891 Unspecified atrial fibrillation: Secondary | ICD-10-CM | POA: Diagnosis not present

## 2019-06-14 DIAGNOSIS — R471 Dysarthria and anarthria: Secondary | ICD-10-CM | POA: Diagnosis not present

## 2019-06-14 DIAGNOSIS — R410 Disorientation, unspecified: Secondary | ICD-10-CM | POA: Diagnosis not present

## 2019-06-14 DIAGNOSIS — D696 Thrombocytopenia, unspecified: Secondary | ICD-10-CM | POA: Diagnosis not present

## 2019-06-14 DIAGNOSIS — R4781 Slurred speech: Secondary | ICD-10-CM | POA: Diagnosis not present

## 2019-06-14 DIAGNOSIS — I6523 Occlusion and stenosis of bilateral carotid arteries: Secondary | ICD-10-CM | POA: Diagnosis not present

## 2019-06-14 DIAGNOSIS — Z87891 Personal history of nicotine dependence: Secondary | ICD-10-CM | POA: Diagnosis not present

## 2019-06-14 DIAGNOSIS — J9811 Atelectasis: Secondary | ICD-10-CM | POA: Diagnosis not present

## 2019-06-14 DIAGNOSIS — Z885 Allergy status to narcotic agent status: Secondary | ICD-10-CM | POA: Diagnosis not present

## 2019-06-14 DIAGNOSIS — I441 Atrioventricular block, second degree: Secondary | ICD-10-CM | POA: Diagnosis not present

## 2019-06-14 DIAGNOSIS — R918 Other nonspecific abnormal finding of lung field: Secondary | ICD-10-CM | POA: Diagnosis not present

## 2019-06-14 DIAGNOSIS — Z79899 Other long term (current) drug therapy: Secondary | ICD-10-CM | POA: Diagnosis not present

## 2019-06-14 DIAGNOSIS — R29810 Facial weakness: Secondary | ICD-10-CM | POA: Diagnosis not present

## 2019-06-14 DIAGNOSIS — R000 Tachycardia, unspecified: Secondary | ICD-10-CM | POA: Diagnosis not present

## 2019-06-14 DIAGNOSIS — I10 Essential (primary) hypertension: Secondary | ICD-10-CM | POA: Diagnosis not present

## 2019-06-14 DIAGNOSIS — R001 Bradycardia, unspecified: Secondary | ICD-10-CM | POA: Diagnosis not present

## 2019-06-14 DIAGNOSIS — I059 Rheumatic mitral valve disease, unspecified: Secondary | ICD-10-CM | POA: Diagnosis not present

## 2019-06-14 DIAGNOSIS — K219 Gastro-esophageal reflux disease without esophagitis: Secondary | ICD-10-CM | POA: Diagnosis not present

## 2019-06-14 DIAGNOSIS — R404 Transient alteration of awareness: Secondary | ICD-10-CM | POA: Diagnosis not present

## 2019-06-14 DIAGNOSIS — E039 Hypothyroidism, unspecified: Secondary | ICD-10-CM | POA: Diagnosis not present

## 2019-06-14 DIAGNOSIS — I453 Trifascicular block: Secondary | ICD-10-CM | POA: Diagnosis not present

## 2019-06-14 DIAGNOSIS — J449 Chronic obstructive pulmonary disease, unspecified: Secondary | ICD-10-CM | POA: Diagnosis not present

## 2019-06-14 DIAGNOSIS — R54 Age-related physical debility: Secondary | ICD-10-CM | POA: Diagnosis not present

## 2019-06-14 DIAGNOSIS — I6782 Cerebral ischemia: Secondary | ICD-10-CM | POA: Diagnosis not present

## 2019-06-14 DIAGNOSIS — Z20822 Contact with and (suspected) exposure to covid-19: Secondary | ICD-10-CM | POA: Diagnosis not present

## 2019-06-14 DIAGNOSIS — I160 Hypertensive urgency: Secondary | ICD-10-CM | POA: Diagnosis not present

## 2019-06-14 DIAGNOSIS — I459 Conduction disorder, unspecified: Secondary | ICD-10-CM | POA: Diagnosis not present

## 2019-06-14 DIAGNOSIS — Z45018 Encounter for adjustment and management of other part of cardiac pacemaker: Secondary | ICD-10-CM | POA: Diagnosis not present

## 2019-06-14 DIAGNOSIS — R1312 Dysphagia, oropharyngeal phase: Secondary | ICD-10-CM | POA: Diagnosis not present

## 2019-06-18 DIAGNOSIS — D696 Thrombocytopenia, unspecified: Secondary | ICD-10-CM | POA: Diagnosis not present

## 2019-06-18 DIAGNOSIS — E039 Hypothyroidism, unspecified: Secondary | ICD-10-CM | POA: Diagnosis not present

## 2019-06-18 DIAGNOSIS — R079 Chest pain, unspecified: Secondary | ICD-10-CM | POA: Diagnosis not present

## 2019-06-18 DIAGNOSIS — J449 Chronic obstructive pulmonary disease, unspecified: Secondary | ICD-10-CM | POA: Diagnosis not present

## 2019-06-18 DIAGNOSIS — E860 Dehydration: Secondary | ICD-10-CM | POA: Diagnosis not present

## 2019-06-18 DIAGNOSIS — E872 Acidosis: Secondary | ICD-10-CM | POA: Diagnosis not present

## 2019-06-18 DIAGNOSIS — G9340 Encephalopathy, unspecified: Secondary | ICD-10-CM | POA: Diagnosis not present

## 2019-06-18 DIAGNOSIS — I441 Atrioventricular block, second degree: Secondary | ICD-10-CM | POA: Diagnosis not present

## 2019-06-18 DIAGNOSIS — N1831 Chronic kidney disease, stage 3a: Secondary | ICD-10-CM | POA: Diagnosis not present

## 2019-06-18 DIAGNOSIS — U071 COVID-19: Secondary | ICD-10-CM | POA: Diagnosis not present

## 2019-06-18 DIAGNOSIS — R55 Syncope and collapse: Secondary | ICD-10-CM | POA: Diagnosis not present

## 2019-06-18 DIAGNOSIS — J690 Pneumonitis due to inhalation of food and vomit: Secondary | ICD-10-CM | POA: Diagnosis not present

## 2019-06-18 DIAGNOSIS — I160 Hypertensive urgency: Secondary | ICD-10-CM | POA: Diagnosis not present

## 2019-06-18 DIAGNOSIS — R1013 Epigastric pain: Secondary | ICD-10-CM | POA: Diagnosis not present

## 2019-06-18 DIAGNOSIS — R9431 Abnormal electrocardiogram [ECG] [EKG]: Secondary | ICD-10-CM | POA: Diagnosis not present

## 2019-06-18 DIAGNOSIS — R1312 Dysphagia, oropharyngeal phase: Secondary | ICD-10-CM | POA: Diagnosis not present

## 2019-06-18 DIAGNOSIS — J9601 Acute respiratory failure with hypoxia: Secondary | ICD-10-CM | POA: Diagnosis not present

## 2019-06-18 DIAGNOSIS — R54 Age-related physical debility: Secondary | ICD-10-CM | POA: Diagnosis not present

## 2019-06-18 DIAGNOSIS — I959 Hypotension, unspecified: Secondary | ICD-10-CM | POA: Diagnosis not present

## 2019-06-18 DIAGNOSIS — R410 Disorientation, unspecified: Secondary | ICD-10-CM | POA: Diagnosis not present

## 2019-06-18 DIAGNOSIS — R4020 Unspecified coma: Secondary | ICD-10-CM | POA: Diagnosis not present

## 2019-06-18 DIAGNOSIS — J9811 Atelectasis: Secondary | ICD-10-CM | POA: Diagnosis not present

## 2019-06-18 DIAGNOSIS — I9589 Other hypotension: Secondary | ICD-10-CM | POA: Diagnosis not present

## 2019-06-18 DIAGNOSIS — D72828 Other elevated white blood cell count: Secondary | ICD-10-CM | POA: Diagnosis not present

## 2019-06-18 DIAGNOSIS — Z743 Need for continuous supervision: Secondary | ICD-10-CM | POA: Diagnosis not present

## 2019-06-18 DIAGNOSIS — R2243 Localized swelling, mass and lump, lower limb, bilateral: Secondary | ICD-10-CM | POA: Diagnosis not present

## 2019-06-18 DIAGNOSIS — N179 Acute kidney failure, unspecified: Secondary | ICD-10-CM | POA: Diagnosis not present

## 2019-06-18 DIAGNOSIS — Z20822 Contact with and (suspected) exposure to covid-19: Secondary | ICD-10-CM | POA: Diagnosis not present

## 2019-06-18 DIAGNOSIS — E43 Unspecified severe protein-calorie malnutrition: Secondary | ICD-10-CM | POA: Diagnosis not present

## 2019-06-18 DIAGNOSIS — K219 Gastro-esophageal reflux disease without esophagitis: Secondary | ICD-10-CM | POA: Diagnosis not present

## 2019-06-18 DIAGNOSIS — J189 Pneumonia, unspecified organism: Secondary | ICD-10-CM | POA: Diagnosis not present

## 2019-06-18 DIAGNOSIS — R0902 Hypoxemia: Secondary | ICD-10-CM | POA: Diagnosis not present

## 2019-06-18 DIAGNOSIS — I248 Other forms of acute ischemic heart disease: Secondary | ICD-10-CM | POA: Diagnosis not present

## 2019-06-18 DIAGNOSIS — R531 Weakness: Secondary | ICD-10-CM | POA: Diagnosis not present

## 2019-06-18 DIAGNOSIS — R778 Other specified abnormalities of plasma proteins: Secondary | ICD-10-CM | POA: Diagnosis not present

## 2019-06-18 DIAGNOSIS — J440 Chronic obstructive pulmonary disease with acute lower respiratory infection: Secondary | ICD-10-CM | POA: Diagnosis not present

## 2019-06-18 DIAGNOSIS — R7989 Other specified abnormal findings of blood chemistry: Secondary | ICD-10-CM | POA: Diagnosis not present

## 2019-06-18 DIAGNOSIS — R404 Transient alteration of awareness: Secondary | ICD-10-CM | POA: Diagnosis not present

## 2019-06-18 DIAGNOSIS — E876 Hypokalemia: Secondary | ICD-10-CM | POA: Diagnosis not present

## 2019-06-18 DIAGNOSIS — K5730 Diverticulosis of large intestine without perforation or abscess without bleeding: Secondary | ICD-10-CM | POA: Diagnosis not present

## 2019-06-18 DIAGNOSIS — R279 Unspecified lack of coordination: Secondary | ICD-10-CM | POA: Diagnosis not present

## 2019-06-18 DIAGNOSIS — D509 Iron deficiency anemia, unspecified: Secondary | ICD-10-CM | POA: Diagnosis not present

## 2019-06-18 DIAGNOSIS — I129 Hypertensive chronic kidney disease with stage 1 through stage 4 chronic kidney disease, or unspecified chronic kidney disease: Secondary | ICD-10-CM | POA: Diagnosis not present

## 2019-06-27 ENCOUNTER — Other Ambulatory Visit: Payer: Self-pay | Admitting: Family Medicine

## 2019-07-01 MED ORDER — ALBUTEROL SULFATE HFA 108 (90 BASE) MCG/ACT IN AERS
2.00 | INHALATION_SPRAY | RESPIRATORY_TRACT | Status: DC
Start: ? — End: 2019-07-01

## 2019-07-01 MED ORDER — BISACODYL 10 MG RE SUPP
10.00 | RECTAL | Status: DC
Start: 2019-07-01 — End: 2019-07-01

## 2019-07-01 MED ORDER — BENZONATATE 100 MG PO CAPS
100.00 | ORAL_CAPSULE | ORAL | Status: DC
Start: ? — End: 2019-07-01

## 2019-07-01 MED ORDER — ENOXAPARIN SODIUM 40 MG/0.4ML ~~LOC~~ SOLN
40.00 | SUBCUTANEOUS | Status: DC
Start: 2019-07-01 — End: 2019-07-01

## 2019-07-01 MED ORDER — PANTOPRAZOLE SODIUM 40 MG IV SOLR
40.00 | INTRAVENOUS | Status: DC
Start: 2019-07-01 — End: 2019-07-01

## 2019-07-01 MED ORDER — ONDANSETRON HCL 4 MG/2ML IJ SOLN
4.00 | INTRAMUSCULAR | Status: DC
Start: ? — End: 2019-07-01

## 2019-07-01 MED ORDER — THERA PO TABS
1.00 | ORAL_TABLET | ORAL | Status: DC
Start: 2019-07-01 — End: 2019-07-01

## 2019-07-01 MED ORDER — QUETIAPINE FUMARATE 25 MG PO TABS
12.50 | ORAL_TABLET | ORAL | Status: DC
Start: 2019-06-30 — End: 2019-07-01

## 2019-07-01 MED ORDER — ASCORBIC ACID 500 MG PO TABS
250.00 | ORAL_TABLET | ORAL | Status: DC
Start: 2019-07-01 — End: 2019-07-01

## 2019-07-01 MED ORDER — GENERIC EXTERNAL MEDICATION
Status: DC
Start: ? — End: 2019-07-01

## 2019-07-01 MED ORDER — ASPIRIN 81 MG PO CHEW
81.00 | CHEWABLE_TABLET | ORAL | Status: DC
Start: 2019-07-01 — End: 2019-07-01

## 2019-07-01 MED ORDER — ACETAMINOPHEN 325 MG PO TABS
650.00 | ORAL_TABLET | ORAL | Status: DC
Start: ? — End: 2019-07-01

## 2019-07-01 MED ORDER — NITROGLYCERIN 0.4 MG SL SUBL
0.40 | SUBLINGUAL_TABLET | SUBLINGUAL | Status: DC
Start: ? — End: 2019-07-01

## 2019-07-01 MED ORDER — HYDRALAZINE HCL 50 MG PO TABS
50.00 | ORAL_TABLET | ORAL | Status: DC
Start: 2019-07-01 — End: 2019-07-01

## 2019-07-01 MED ORDER — UMECLIDINIUM-VILANTEROL 62.5-25 MCG/INH IN AEPB
1.00 | INHALATION_SPRAY | RESPIRATORY_TRACT | Status: DC
Start: 2019-07-01 — End: 2019-07-01

## 2019-07-01 MED ORDER — ENALAPRILAT 1.25 MG/ML IV INJ
1.25 | INJECTION | INTRAVENOUS | Status: DC
Start: ? — End: 2019-07-01

## 2019-07-01 MED ORDER — ACETAMINOPHEN 650 MG RE SUPP
650.00 | RECTAL | Status: DC
Start: ? — End: 2019-07-01

## 2019-07-01 MED ORDER — ALUM & MAG HYDROXIDE-SIMETH 200-200-20 MG/5ML PO SUSP
30.00 | ORAL | Status: DC
Start: ? — End: 2019-07-01

## 2019-07-01 MED ORDER — LEVOTHYROXINE SODIUM 100 MCG PO TABS
100.00 | ORAL_TABLET | ORAL | Status: DC
Start: 2019-07-01 — End: 2019-07-01

## 2019-07-01 MED ORDER — IPRATROPIUM BROMIDE HFA 17 MCG/ACT IN AERS
2.00 | INHALATION_SPRAY | RESPIRATORY_TRACT | Status: DC
Start: 2019-06-30 — End: 2019-07-01

## 2019-07-01 MED ORDER — ZINC SULFATE 220 (50 ZN) MG PO CAPS
220.00 | ORAL_CAPSULE | ORAL | Status: DC
Start: 2019-07-01 — End: 2019-07-01

## 2019-07-01 MED ORDER — SODIUM CHLORIDE 0.9 % IV SOLN
10.00 | INTRAVENOUS | Status: DC
Start: ? — End: 2019-07-01

## 2019-07-01 MED ORDER — ALBUTEROL SULFATE HFA 108 (90 BASE) MCG/ACT IN AERS
2.00 | INHALATION_SPRAY | RESPIRATORY_TRACT | Status: DC
Start: 2019-06-30 — End: 2019-07-01

## 2019-07-01 MED ORDER — FERROUS SULFATE 325 (65 FE) MG PO TABS
325.00 | ORAL_TABLET | ORAL | Status: DC
Start: 2019-07-01 — End: 2019-07-01

## 2019-07-02 ENCOUNTER — Telehealth: Payer: Self-pay | Admitting: Family Medicine

## 2019-07-02 DIAGNOSIS — I959 Hypotension, unspecified: Secondary | ICD-10-CM | POA: Diagnosis not present

## 2019-07-02 DIAGNOSIS — Z4800 Encounter for change or removal of nonsurgical wound dressing: Secondary | ICD-10-CM | POA: Diagnosis not present

## 2019-07-02 DIAGNOSIS — L89322 Pressure ulcer of left buttock, stage 2: Secondary | ICD-10-CM | POA: Diagnosis not present

## 2019-07-02 DIAGNOSIS — L89152 Pressure ulcer of sacral region, stage 2: Secondary | ICD-10-CM | POA: Diagnosis not present

## 2019-07-02 DIAGNOSIS — N189 Chronic kidney disease, unspecified: Secondary | ICD-10-CM | POA: Diagnosis not present

## 2019-07-02 DIAGNOSIS — N179 Acute kidney failure, unspecified: Secondary | ICD-10-CM | POA: Diagnosis not present

## 2019-07-02 DIAGNOSIS — J9601 Acute respiratory failure with hypoxia: Secondary | ICD-10-CM | POA: Diagnosis not present

## 2019-07-02 DIAGNOSIS — R001 Bradycardia, unspecified: Secondary | ICD-10-CM | POA: Diagnosis not present

## 2019-07-02 DIAGNOSIS — I129 Hypertensive chronic kidney disease with stage 1 through stage 4 chronic kidney disease, or unspecified chronic kidney disease: Secondary | ICD-10-CM | POA: Diagnosis not present

## 2019-07-02 DIAGNOSIS — R1312 Dysphagia, oropharyngeal phase: Secondary | ICD-10-CM | POA: Diagnosis not present

## 2019-07-02 DIAGNOSIS — J441 Chronic obstructive pulmonary disease with (acute) exacerbation: Secondary | ICD-10-CM | POA: Diagnosis not present

## 2019-07-02 NOTE — Telephone Encounter (Signed)
Please check on TCM/follow up.  Recent admission for Covid at [facility].  Thanks.

## 2019-07-03 ENCOUNTER — Telehealth: Payer: Self-pay

## 2019-07-03 ENCOUNTER — Telehealth: Payer: Self-pay | Admitting: Family Medicine

## 2019-07-03 ENCOUNTER — Telehealth: Payer: Self-pay | Admitting: Adult Health

## 2019-07-03 DIAGNOSIS — Z4800 Encounter for change or removal of nonsurgical wound dressing: Secondary | ICD-10-CM | POA: Diagnosis not present

## 2019-07-03 DIAGNOSIS — I129 Hypertensive chronic kidney disease with stage 1 through stage 4 chronic kidney disease, or unspecified chronic kidney disease: Secondary | ICD-10-CM | POA: Diagnosis not present

## 2019-07-03 DIAGNOSIS — N179 Acute kidney failure, unspecified: Secondary | ICD-10-CM | POA: Diagnosis not present

## 2019-07-03 DIAGNOSIS — N189 Chronic kidney disease, unspecified: Secondary | ICD-10-CM | POA: Diagnosis not present

## 2019-07-03 DIAGNOSIS — L89152 Pressure ulcer of sacral region, stage 2: Secondary | ICD-10-CM | POA: Diagnosis not present

## 2019-07-03 DIAGNOSIS — R001 Bradycardia, unspecified: Secondary | ICD-10-CM | POA: Diagnosis not present

## 2019-07-03 DIAGNOSIS — I959 Hypotension, unspecified: Secondary | ICD-10-CM | POA: Diagnosis not present

## 2019-07-03 DIAGNOSIS — J9601 Acute respiratory failure with hypoxia: Secondary | ICD-10-CM | POA: Diagnosis not present

## 2019-07-03 DIAGNOSIS — L89322 Pressure ulcer of left buttock, stage 2: Secondary | ICD-10-CM | POA: Diagnosis not present

## 2019-07-03 DIAGNOSIS — R1312 Dysphagia, oropharyngeal phase: Secondary | ICD-10-CM | POA: Diagnosis not present

## 2019-07-03 DIAGNOSIS — J441 Chronic obstructive pulmonary disease with (acute) exacerbation: Secondary | ICD-10-CM | POA: Diagnosis not present

## 2019-07-03 NOTE — Telephone Encounter (Signed)
Spoke with pt's daughter, [name]. She had some questions about the pt's medications. When he was discharged from the hospital, they told her to stop his daily Azithromycin and Zyrtec. They did not give her a reason as to why these medications needed to be stopped. They also gave her an albuterol inhaler to be taken q6h, pt is already on albuterol nebs q6h. [name] is wanting to know if he needs stop these medications or continue them. She was not going to stop giving them to him unless we told her to.  Tammy - please advise. Thanks!

## 2019-07-03 NOTE — Telephone Encounter (Signed)
Christy with [facility] called to get verbal orders.  Penelope  Both 2 times a week for 3 weeks.     Christ also stated that the patient has a pressure ulcer stage 2 on his coccyx area and left buttocks.  She would like to know if it is okay to use duramare dressings  PHONE_ [phone]

## 2019-07-03 NOTE — Telephone Encounter (Signed)
Please complete TCM for this patient. Thanks

## 2019-07-03 NOTE — Telephone Encounter (Signed)
Spoke with pt's daughter, [name]. She is aware of Tammy's response. HFU has been scheduled for 07/14/19 at 1145. Nothing further was needed.

## 2019-07-03 NOTE — Telephone Encounter (Signed)
Albuterol inhaler or neb is As needed  -either or , not both to be used.   Azithromycin stopped due to syncope and symptomatic bradycardia . That is what I suspect but unclear in hospital records. Okay to remain off for now , can add back at later date if needed.   Make sure has a post hospital follow up at some point in next few weeks or virtual visit.

## 2019-07-03 NOTE — Telephone Encounter (Signed)
LMTCB x1 for pt's daughter, [name] [name].

## 2019-07-03 NOTE — Telephone Encounter (Signed)
Please give the order (but please clarify the type of dressing they want to use).  Thanks.

## 2019-07-03 NOTE — Telephone Encounter (Signed)
Transition Care Management Follow-up Telephone Call  Date of discharge and from where: 06/30/2019, [facility]  How have you been since you were released from the hospital? Patient is doing better. No symptoms currently  Any questions or concerns? No   Items Reviewed:  Did the pt receive and understand the discharge instructions provided? Yes   Medications obtained and verified? Yes   Any new allergies since your discharge? No   Dietary orders reviewed? Yes  Do you have support at home? Yes   Functional Questionnaire: (I = Independent and D = Dependent) ADLs: D  Bathing/Dressing- D  Meal Prep- D  Eating- I  Maintaining continence- I  Transferring/Ambulation- D  Managing Meds- D  Follow up appointments reviewed:   PCP Hospital f/u appt confirmed? Yes  Scheduled to see Dr. Damita Dunnings on 07/06/2019 @ 3:15 pm.  [facility] f/u appt confirmed? N/A   Are transportation arrangements needed? No   If their condition worsens, is the pt aware to call PCP or go to the Emergency Dept.? Yes  Was the patient provided with contact information for the PCP's office or ED? Yes  Was to pt encouraged to call back with questions or concerns? Yes

## 2019-07-04 NOTE — Telephone Encounter (Signed)
[name] [name] returned phone call to clarify - she states she wanted to to know if okay to use use DuoDerm dressing for pressure ulcer. Will route to Dr. Damita Dunnings.

## 2019-07-04 NOTE — Telephone Encounter (Signed)
Yes please give the order.  Please let me know if this is not healing over/improving.  Thanks.

## 2019-07-04 NOTE — Telephone Encounter (Signed)
Attempted to reach christy, no answer. Left detailed vm with verbal ok

## 2019-07-04 NOTE — Telephone Encounter (Signed)
Attempted to reach [name] [name], no answer. Left vm to call back

## 2019-07-05 DIAGNOSIS — J449 Chronic obstructive pulmonary disease, unspecified: Secondary | ICD-10-CM | POA: Diagnosis not present

## 2019-07-06 ENCOUNTER — Emergency Department (HOSPITAL_COMMUNITY): Payer: Medicare Other

## 2019-07-06 ENCOUNTER — Inpatient Hospital Stay: Payer: Medicare Other | Admitting: Family Medicine

## 2019-07-06 ENCOUNTER — Encounter (HOSPITAL_COMMUNITY): Payer: Self-pay | Admitting: Emergency Medicine

## 2019-07-06 ENCOUNTER — Telehealth: Payer: Self-pay | Admitting: Family Medicine

## 2019-07-06 ENCOUNTER — Inpatient Hospital Stay (HOSPITAL_COMMUNITY)
Admission: EM | Admit: 2019-07-06 | Discharge: 2019-07-11 | DRG: 178 | Disposition: A | Discharge status: Home Health | Payer: Medicare Other | Attending: Internal Medicine | Admitting: Internal Medicine

## 2019-07-06 DIAGNOSIS — Z9049 Acquired absence of other specified parts of digestive tract: Secondary | ICD-10-CM | POA: Diagnosis not present

## 2019-07-06 DIAGNOSIS — I2510 Atherosclerotic heart disease of native coronary artery without angina pectoris: Secondary | ICD-10-CM | POA: Diagnosis not present

## 2019-07-06 DIAGNOSIS — J189 Pneumonia, unspecified organism: Secondary | ICD-10-CM | POA: Diagnosis not present

## 2019-07-06 DIAGNOSIS — J690 Pneumonitis due to inhalation of food and vomit: Principal | ICD-10-CM | POA: Diagnosis present

## 2019-07-06 DIAGNOSIS — E039 Hypothyroidism, unspecified: Secondary | ICD-10-CM | POA: Diagnosis not present

## 2019-07-06 DIAGNOSIS — D649 Anemia, unspecified: Secondary | ICD-10-CM | POA: Diagnosis present

## 2019-07-06 DIAGNOSIS — Z66 Do not resuscitate: Secondary | ICD-10-CM | POA: Diagnosis not present

## 2019-07-06 DIAGNOSIS — R1310 Dysphagia, unspecified: Secondary | ICD-10-CM | POA: Diagnosis not present

## 2019-07-06 DIAGNOSIS — L89152 Pressure ulcer of sacral region, stage 2: Secondary | ICD-10-CM | POA: Diagnosis present

## 2019-07-06 DIAGNOSIS — M2550 Pain in unspecified joint: Secondary | ICD-10-CM | POA: Diagnosis not present

## 2019-07-06 DIAGNOSIS — Z79899 Other long term (current) drug therapy: Secondary | ICD-10-CM

## 2019-07-06 DIAGNOSIS — Z96641 Presence of right artificial hip joint: Secondary | ICD-10-CM | POA: Diagnosis present

## 2019-07-06 DIAGNOSIS — T17908D Unspecified foreign body in respiratory tract, part unspecified causing other injury, subsequent encounter: Secondary | ICD-10-CM | POA: Diagnosis not present

## 2019-07-06 DIAGNOSIS — L89159 Pressure ulcer of sacral region, unspecified stage: Secondary | ICD-10-CM | POA: Diagnosis not present

## 2019-07-06 DIAGNOSIS — G9341 Metabolic encephalopathy: Secondary | ICD-10-CM | POA: Diagnosis not present

## 2019-07-06 DIAGNOSIS — E876 Hypokalemia: Secondary | ICD-10-CM | POA: Diagnosis present

## 2019-07-06 DIAGNOSIS — I110 Hypertensive heart disease with heart failure: Secondary | ICD-10-CM | POA: Diagnosis not present

## 2019-07-06 DIAGNOSIS — E611 Iron deficiency: Secondary | ICD-10-CM | POA: Diagnosis present

## 2019-07-06 DIAGNOSIS — N281 Cyst of kidney, acquired: Secondary | ICD-10-CM | POA: Diagnosis not present

## 2019-07-06 DIAGNOSIS — Z7401 Bed confinement status: Secondary | ICD-10-CM | POA: Diagnosis not present

## 2019-07-06 DIAGNOSIS — I10 Essential (primary) hypertension: Secondary | ICD-10-CM | POA: Diagnosis present

## 2019-07-06 DIAGNOSIS — C4440 Unspecified malignant neoplasm of skin of scalp and neck: Secondary | ICD-10-CM | POA: Diagnosis not present

## 2019-07-06 DIAGNOSIS — K5900 Constipation, unspecified: Secondary | ICD-10-CM | POA: Diagnosis not present

## 2019-07-06 DIAGNOSIS — Z87891 Personal history of nicotine dependence: Secondary | ICD-10-CM

## 2019-07-06 DIAGNOSIS — J449 Chronic obstructive pulmonary disease, unspecified: Secondary | ICD-10-CM | POA: Diagnosis not present

## 2019-07-06 DIAGNOSIS — Z20822 Contact with and (suspected) exposure to covid-19: Secondary | ICD-10-CM | POA: Diagnosis not present

## 2019-07-06 DIAGNOSIS — J439 Emphysema, unspecified: Secondary | ICD-10-CM | POA: Diagnosis present

## 2019-07-06 DIAGNOSIS — R0602 Shortness of breath: Secondary | ICD-10-CM | POA: Diagnosis not present

## 2019-07-06 DIAGNOSIS — K219 Gastro-esophageal reflux disease without esophagitis: Secondary | ICD-10-CM | POA: Diagnosis not present

## 2019-07-06 DIAGNOSIS — L8990 Pressure ulcer of unspecified site, unspecified stage: Secondary | ICD-10-CM | POA: Insufficient documentation

## 2019-07-06 DIAGNOSIS — R0902 Hypoxemia: Secondary | ICD-10-CM | POA: Diagnosis not present

## 2019-07-06 DIAGNOSIS — Z8616 Personal history of COVID-19: Secondary | ICD-10-CM

## 2019-07-06 DIAGNOSIS — R5381 Other malaise: Secondary | ICD-10-CM | POA: Diagnosis not present

## 2019-07-06 DIAGNOSIS — Z79890 Hormone replacement therapy (postmenopausal): Secondary | ICD-10-CM

## 2019-07-06 DIAGNOSIS — I5032 Chronic diastolic (congestive) heart failure: Secondary | ICD-10-CM | POA: Diagnosis present

## 2019-07-06 DIAGNOSIS — G9340 Encephalopathy, unspecified: Secondary | ICD-10-CM | POA: Diagnosis not present

## 2019-07-06 HISTORY — DX: Bradycardia, unspecified: R00.1

## 2019-07-06 LAB — URINALYSIS, ROUTINE W REFLEX MICROSCOPIC
Bilirubin Urine: NEGATIVE
Glucose, UA: NEGATIVE mg/dL
Ketones, ur: 5 mg/dL — AB
Leukocytes,Ua: NEGATIVE
Nitrite: NEGATIVE
Protein, ur: NEGATIVE mg/dL
Specific Gravity, Urine: 1.034 — ABNORMAL HIGH (ref 1.005–1.030)
pH: 5 (ref 5.0–8.0)

## 2019-07-06 LAB — CBC
HCT: 33.2 % — ABNORMAL LOW (ref 39.0–52.0)
Hemoglobin: 10.5 g/dL — ABNORMAL LOW (ref 13.0–17.0)
MCH: 31.1 pg (ref 26.0–34.0)
MCHC: 31.6 g/dL (ref 30.0–36.0)
MCV: 98.2 fL (ref 80.0–100.0)
Platelets: 161 10*3/uL (ref 150–400)
RBC: 3.38 MIL/uL — ABNORMAL LOW (ref 4.22–5.81)
RDW: 13.2 % (ref 11.5–15.5)
WBC: 6.3 10*3/uL (ref 4.0–10.5)
nRBC: 0 % (ref 0.0–0.2)

## 2019-07-06 LAB — HEPATIC FUNCTION PANEL
ALT: 18 U/L (ref 0–44)
AST: 37 U/L (ref 15–41)
Albumin: 2.7 g/dL — ABNORMAL LOW (ref 3.5–5.0)
Alkaline Phosphatase: 59 U/L (ref 38–126)
Bilirubin, Direct: 0.2 mg/dL (ref 0.0–0.2)
Indirect Bilirubin: 0.3 mg/dL (ref 0.3–0.9)
Total Bilirubin: 0.5 mg/dL (ref 0.3–1.2)
Total Protein: 6.3 g/dL — ABNORMAL LOW (ref 6.5–8.1)

## 2019-07-06 LAB — BASIC METABOLIC PANEL
Anion gap: 9 (ref 5–15)
BUN: 20 mg/dL (ref 8–23)
CO2: 23 mmol/L (ref 22–32)
Calcium: 8.4 mg/dL — ABNORMAL LOW (ref 8.9–10.3)
Chloride: 106 mmol/L (ref 98–111)
Creatinine, Ser: 1.13 mg/dL (ref 0.61–1.24)
GFR calc Af Amer: >60 mL/min (ref >60)
GFR calc non Af Amer: 56 mL/min — ABNORMAL LOW (ref >60)
Glucose, Bld: 88 mg/dL (ref 70–99)
Potassium: 3.8 mmol/L (ref 3.5–5.1)
Sodium: 138 mmol/L (ref 135–145)

## 2019-07-06 MED ORDER — SODIUM CHLORIDE 0.9 % IV BOLUS
500.0000 mL | Freq: Once | INTRAVENOUS | Status: AC
  Administered 2019-07-06: 500 mL via INTRAVENOUS

## 2019-07-06 MED ORDER — VANCOMYCIN HCL 1250 MG/250ML IV SOLN
1250.0000 mg | Freq: Once | INTRAVENOUS | Status: AC
  Administered 2019-07-07: 1250 mg via INTRAVENOUS
  Filled 2019-07-06: qty 250

## 2019-07-06 MED ORDER — IOHEXOL 300 MG/ML  SOLN
100.0000 mL | Freq: Once | INTRAMUSCULAR | Status: AC | PRN
  Administered 2019-07-06: 100 mL via INTRAVENOUS

## 2019-07-06 MED ORDER — SENNOSIDES-DOCUSATE SODIUM 8.6-50 MG PO TABS
1.0000 | ORAL_TABLET | Freq: Once | ORAL | Status: DC
  Filled 2019-07-06: qty 1

## 2019-07-06 MED ORDER — VANCOMYCIN HCL 750 MG/150ML IV SOLN
750.0000 mg | INTRAVENOUS | Status: DC
  Filled 2019-07-06: qty 150

## 2019-07-06 MED ORDER — SODIUM CHLORIDE 0.9 % IV SOLN
1.0000 g | Freq: Once | INTRAVENOUS | Status: AC
  Administered 2019-07-07: 1 g via INTRAVENOUS
  Filled 2019-07-06: qty 1

## 2019-07-06 MED ORDER — IOHEXOL 350 MG/ML SOLN
100.0000 mL | Freq: Once | INTRAVENOUS | Status: AC | PRN
  Administered 2019-07-06: 100 mL via INTRAVENOUS

## 2019-07-06 MED ORDER — ENOXAPARIN SODIUM 40 MG/0.4ML ~~LOC~~ SOLN: 40.0000 mg | SUBCUTANEOUS | Status: DC

## 2019-07-06 MED ORDER — PIPERACILLIN-TAZOBACTAM 3.375 G IVPB
3.3750 g | Freq: Three times a day (TID) | INTRAVENOUS | Status: DC
  Administered 2019-07-07 (×2): 3.375 g via INTRAVENOUS
  Filled 2019-07-06 (×2): qty 50

## 2019-07-06 MED ORDER — SODIUM CHLORIDE 0.9 % IV BOLUS
500.0000 mL | Freq: Once | INTRAVENOUS | Status: AC
  Administered 2019-07-06: 19:00:00 500 mL via INTRAVENOUS

## 2019-07-06 NOTE — Telephone Encounter (Addendum)
I spoke with [name] (DPR signed) pt was dc from hospital on 06/30/19. When first came home pt was passing urine OK. Pt has been having pain in lt knee but has bakers cyst and rt leg not straightening out after being in bed for 13 days. HH PT is working with pt.  pt has weakness all over body. Pt sat up on side of bed and fed himself yesterday. Pt has prod cough with gray yellow phelgm; [name] thinks pt may have aspirated on food at breakfast on 07/05/19.Marland Kitchen pt had one episode of SOB for 10 - 15' on 07/05/19. No SOB since then. pts abd is tight; no BM since 06/30/19. Pt taking miralax.  Pt not voiding a lot; pt has diaper on and no urination since midday on 07/05/19. pts mouth is dry and dizziness on and off. No vomiting. Pt is not eating or drinking a lot. Pt is asleep right night but breathing OK.[name] advised pt had + covid but when tested prior to discharge on 06/30/19 p tested neg for covid.  Advised pt needs eval at ED. Avie Echevaria NP agreed ED disposition. [name] voiced understanding and needs to cancel [facility] PT and nurse and asked me to call 911. I called 911 and requested transport to [facility] ED per [facility] request. FYI to Dr Damita Dunnings and Avie Echevaria NP.

## 2019-07-06 NOTE — ED Triage Notes (Signed)
Pt had surgery last Friday. Endorses constipation and decrease in appetite and output since then. Denies abd pain. EMS not sure what surgery pt had. Pt states he had hip and knee surgery.

## 2019-07-06 NOTE — Telephone Encounter (Signed)
Daughter (sharon ) concern pt is constipated giving him mirlax and pt is passing very little urine.  Drinking a lot water coffee and protein drinks. Home health nurse in coming today between 9:30 and 10:30 can you do virtual

## 2019-07-06 NOTE — Telephone Encounter (Signed)
[name] [name]  Can you help with this patient. He may need to be triaged

## 2019-07-06 NOTE — ED Notes (Signed)
[name] daughter call with and update  [phone]

## 2019-07-06 NOTE — Progress Notes (Signed)
Pharmacy Antibiotic Note  [name] Grosser Sr. is a 86 y.o. male admitted on 07/06/2019 with pneumonia.  Pharmacy has been consulted for vancomycin and zosyn dosing. Vancomycin 1250 mg IV ordered in the ED  Plan: Cont vanc 750 mg IV q24 hours Zosyn 3.375 gm IV q8 hours F/u renal function, cultures and clinical course     Temp (24hrs), Avg:98.1 F (36.7 C), Min:97.6 F (36.4 C), Max:98.6 F (37 C)  Recent Labs  Lab 07/06/19 1116  WBC 6.3  CREATININE 1.13    CrCl cannot be calculated (Unknown ideal weight.).    Allergies  Allergen Reactions  . Codeine Nausea And Vomiting     Thank you for allowing pharmacy to be a part of this patient's care.  Excell Seltzer Poteet 07/06/2019 11:52 PM

## 2019-07-06 NOTE — ED Notes (Signed)
[name] [name] (Daughter/POA#[phone])called/would like to visit patient once in a room.

## 2019-07-06 NOTE — ED Provider Notes (Addendum)
[facility] EMERGENCY DEPARTMENT Provider Note   CSN: [id number] Arrival date & time: 07/06/19  1102     History Chief Complaint  Patient presents with  . Constipation    [name] Kuethe Sr. is a 86 y.o. male.  HPI   Patient is a 86 year old male with a history of anemia, COPD, hypertension, hypothyroidism, sacral ulcer, who presents the emergency department today for evaluation of decreased urine and stool output.  Pt seems a little bit confused and therefor there is a level 5 caveat.    5:42 PM Discussed case with [name], patients daughter,whom the patient lives with. She states that the patient has had reduced urine output for about 1 week. She is also concerned that he has not had a BM for about 6 days. Was admitted to [facility] from 12/23-1/1 for bradycardia. Had pacemaker placed at that time. Resumed home meds, developed hypotension, and was readmitted 1/3 for hypotension and AMS. Dx with COVID during admission and d/c 1/15. Has had decreased food intake but has been drinking fluids. Has not had BM since he got home. She has been using 1/2 capfuls of miralax for the last 5 days without any output. Denies fevers. She states he has a chronic productive cough that is unchanged. She feels like it was actually improved as of yesterday.  She states that at baseline the patient does not have dementia, but he does get confused when he is in new situations so his mental status today is normal for him. States that left neck wound has been identified as carcinoma, he is following with dermatology for this and has plan to get this excised.   Past Medical History:  Diagnosis Date  . Anemia   . COPD (chronic obstructive pulmonary disease) (HCC)    Mild  . HTN (hypertension)   . Hypothyroidism     Patient Active Problem List   Diagnosis Date Noted  . Skin lesion 12/28/2018  . Sacral ulcer ([place]) 10/17/2018  . H/O laceration of skin 08/24/2018  . Left knee pain 08/24/2018    . Oropharyngeal dysphagia 05/03/2018  . Advance care planning 05/02/2018  . Protein-calorie malnutrition, severe 03/29/2018  . Altered mental status   . Acute encephalopathy 03/22/2018  . Hypothyroidism 03/22/2018  . Pneumonia 03/10/2018  . Unsteady gait 01/12/2018  . HTN (hypertension) 01/12/2018  . Hyponatremia 01/12/2018  . Status post hip hemiarthroplasty 12/28/2017  . Chronic diastolic heart failure ([place]) 12/06/2017  . GERD (gastroesophageal reflux disease) 12/06/2017  . Aspiration into airway 02/17/2017  . Osteopenia 01/05/2017  . Anemia, iron deficiency 09/13/2015  . Fall 07/23/2015  . Herpes zoster 04/01/2015  . Chronic back pain 01/21/2015  . Spondylosis of lumbar region without myelopathy or radiculopathy 01/21/2015  . DDD (degenerative disc disease), lumbar 11/26/2014  . Thrombocytopenic ([place]) 09/19/2014  . Normocytic normochromic anemia 12/25/2013  . Solitary pulmonary nodule 03/08/2013  . Diastolic dysfunction [id number]  . Chronic cough 08/26/2012  . COPD (chronic obstructive pulmonary disease) with emphysema ([place]) 08/12/2012  . Chronic respiratory failure ([place]) 08/12/2012  . Benign prostatic hyperplasia 03/18/2011    Past Surgical History:  Procedure Laterality Date  . CATARACT EXTRACTION Bilateral   . GALLBLADDER SURGERY  02/21/2011  . OTHER SURGICAL HISTORY     finger right pointer--laceration repair  . TOTAL HIP ARTHROPLASTY         Family History  Problem Relation Age of Onset  . Other Father        enlarged heart  . Lung  disease Mother   . COPD Sister   . Heart disease Sister        Pacemaker  . Lung cancer Sister   . Colon cancer Neg Hx     Social History   Tobacco Use  . Smoking status: Former Smoker    Packs/day: 1.50    Years: 25.00    Pack years: 37.50    Types: Cigarettes    Quit date: 06/16/1971    Years since quitting: 48.0  . Smokeless tobacco: Never Used  . Tobacco comment: started at 86 years old  Substance Use Topics  .  Alcohol use: No  . Drug use: No    Home Medications Prior to Admission medications   Medication Sig Start Date End Date Taking? Authorizing Provider  albuterol (PROVENTIL) (2.5 MG/3ML) 0.083% nebulizer solution Take 3 mLs (2.5 mg total) by nebulization every 6 (six) hours as needed for wheezing or shortness of breath (dx J43.2). 05/24/19   Parrett, Fonnie Mu, NP  azithromycin (ZITHROMAX) 250 MG tablet TAKE 1 TABLET BY MOUTH EVERY DAY 04/26/19   Juanito Doom, MD  Calcium Carb-Cholecalciferol (CALCIUM-VITAMIN D) 500-200 MG-UNIT tablet Take 1 tablet by mouth daily. 08/23/18   Tonia Ghent, MD  cetirizine (ZYRTEC) 10 MG tablet Take 10 mg by mouth daily.    [provider]  ferrous sulfate 325 (65 FE) MG EC tablet Take 325 mg by mouth daily with breakfast.    [provider]  Fluticasone-Umeclidin-Vilant 100-62.5-25 MCG/INH AEPB Take 1 puff by mouth daily. 03/30/19   Parrett, Fonnie Mu, NP  guaiFENesin (ROBITUSSIN) 100 MG/5ML liquid Take 10 mLs (200 mg total) by mouth 3 (three) times daily as needed for cough. 08/23/18   Tonia Ghent, MD  levothyroxine (SYNTHROID) 100 MCG tablet PLACE 1 TABLET INTO FEEDING TUBE DAILY. 05/22/19   Tonia Ghent, MD  omeprazole (PRILOSEC) 40 MG capsule TAKE 1 CAPSULE BY MOUTH EVERY DAY 02/24/19   Tonia Ghent, MD  polyethylene glycol [facility] / Floria Raveling) packet Take 17 g by mouth daily as needed for mild constipation. 08/23/18   Tonia Ghent, MD  QUEtiapine (SEROQUEL) 25 MG tablet TAKE 1/2 TABLET BY MOUTH AT BEDTIME DAILY 06/27/19   Tonia Ghent, MD  Respiratory Therapy Supplies (FLUTTER) DEVI Use as directed 07/26/18   Juanito Doom, MD  torsemide (DEMADEX) 20 MG tablet TAKE 1 TABLET (20 MG TOTAL) BY MOUTH DAILY AS NEEDED (ANKLE SWELLING OR 2 POUNDS ABOVE DRY WEIGHT). 02/23/19   Juanito Doom, MD  verapamil (CALAN) 80 MG tablet PLACE 1 TABLET BY MOUTH TWICE DAILY 05/01/19   Tonia Ghent, MD    Allergies     Codeine  Review of Systems   Review of Systems  Unable to perform ROS: Mental status change    Physical Exam Updated Vital Signs BP 138/75   Pulse 88   Temp 97.6 F (36.4 C) (Oral)   Resp 17   SpO2 95%   Physical Exam Vitals and nursing note reviewed.  Constitutional:      Appearance: He is well-developed.  HENT:     Head: Normocephalic and atraumatic.  Eyes:     Conjunctiva/sclera: Conjunctivae normal.  Cardiovascular:     Rate and Rhythm: Normal rate and regular rhythm.     Heart sounds: Normal heart sounds. No murmur.  Pulmonary:     Effort: Pulmonary effort is normal. No respiratory distress.     Breath sounds: Wheezing present. No rhonchi or rales.  Abdominal:     General: Bowel sounds are normal.     Palpations: Abdomen is soft.     Tenderness: There is no abdominal tenderness. There is no guarding or rebound.  Musculoskeletal:     Cervical back: Neck supple.  Skin:    General: Skin is warm and dry.     Findings: Bruising (in various stages of healing (per daughter he has had these since he got home from the hospital)) present.  Neurological:     Mental Status: He is alert.     Comments: Alert, moving all extremities     ED Results / Procedures / Treatments   Labs (all labs ordered are listed, but only abnormal results are displayed) Labs Reviewed  CBC - Abnormal; Notable for the following components:      Result Value   RBC 3.38 (*)    Hemoglobin 10.5 (*)    HCT 33.2 (*)    All other components within normal limits  BASIC METABOLIC PANEL - Abnormal; Notable for the following components:   Calcium 8.4 (*)    GFR calc non Af Amer 56 (*)    All other components within normal limits  URINALYSIS, ROUTINE W REFLEX MICROSCOPIC - Abnormal; Notable for the following components:   Specific Gravity, Urine 1.034 (*)    Hgb urine dipstick MODERATE (*)    Ketones, ur 5 (*)    Bacteria, UA RARE (*)    All other components within normal limits  HEPATIC  FUNCTION PANEL - Abnormal; Notable for the following components:   Total Protein 6.3 (*)    Albumin 2.7 (*)    All other components within normal limits  URINE CULTURE  CULTURE, BLOOD (ROUTINE X 2)  CULTURE, BLOOD (ROUTINE X 2)  SARS CORONAVIRUS 2 (TAT 6-24 HRS)    EKG None  Radiology CT Angio Chest PE W and/or Wo Contrast  Result Date: 07/06/2019 CLINICAL DATA:  Pulmonary emboli suspected in the right lower lobe at abdominal CT. EXAM: CT ANGIOGRAPHY CHEST WITH CONTRAST TECHNIQUE: Multidetector CT imaging of the chest was performed using the standard protocol during bolus administration of intravenous contrast. Multiplanar CT image reconstructions and MIPs were obtained to evaluate the vascular anatomy. CONTRAST:  142mL OMNIPAQUE IOHEXOL 350 MG/ML SOLN COMPARISON:  Abdominal CT same day FINDINGS: Cardiovascular: Pulmonary arterial opacification is good. There are no pulmonary emboli. Specific attention to the right lower lobe pulmonary arterial tree does not show embolic disease. Heart size is normal. The patient has a pericardial effusion. There is extensive coronary artery calcification. There is advanced atherosclerotic disease of the thoracic aorta. Mediastinum/Nodes: No paratracheal adenopathy. Mild nodal prominence in the Peri carinal region, possibly reactive. Lungs/Pleura: Upper lungs show emphysema and mild scarring. There is mild atelectasis at the left lung base. The right mainstem bronchus is patent as is the right upper lobe bronchus. The bronchus intermedia is opacified. There is central volume loss in the right lower lobe. All of this may be inflammatory disease, but I cannot rule out neoplastic disease particularly in the right lower lobe. Follow-up will be necessary after treatment. Upper Abdomen: Negative Musculoskeletal: Ordinary spinal degenerative changes. Review of the MIP images confirms the above findings. IMPRESSION: No pulmonary emboli. Advanced aortic atherosclerosis.  Coronary artery calcification. Pericardial effusion. Opacification of the bronchus intermedia on the right with subtotal collapse/infiltrate in the right lower lobe. Findings are most consistent with inflammatory disease. Cannot rule out possibility of tumor in this region. Follow-up after treatment will be necessary. Aortic  Atherosclerosis (ICD10-I70.0) and Emphysema (ICD10-J43.9). Electronically Signed   By: Nelson Chimes M.D.   On: 07/06/2019 21:55   CT ABDOMEN PELVIS W CONTRAST  Result Date: 07/06/2019 CLINICAL DATA:  Constipation. EXAM: CT ABDOMEN AND PELVIS WITH CONTRAST TECHNIQUE: Multidetector CT imaging of the abdomen and pelvis was performed using the standard protocol following bolus administration of intravenous contrast. CONTRAST:  164mL OMNIPAQUE IOHEXOL 300 MG/ML  SOLN COMPARISON:  None. FINDINGS: Lower chest: Mild atelectasis and/or infiltrate is seen within the right lung base. There is a small pericardial effusion. Ill-defined filling defects are suspected within the posterior lower lobe branches of the right pulmonary artery. Hepatobiliary: No focal liver abnormality is seen. Status post cholecystectomy. No biliary dilatation. Pancreas: Unremarkable. No pancreatic ductal dilatation or surrounding inflammatory changes. Spleen: An 8 mm cystic appearing areas seen within an otherwise normal-appearing spleen. Adrenals/Urinary Tract: The adrenal glands are slightly nodular in appearance. Kidneys are normal in size, without renal calculi or hydronephrosis. A 1.8 cm simple cyst is seen within the lower pole of the right kidney. Bladder is unremarkable. Stomach/Bowel: Stomach is within normal limits. The appendix is not identified. No evidence of bowel dilatation. A large amount of stool is seen throughout the colon. Noninflamed diverticula are seen throughout the sigmoid colon. Vascular/Lymphatic: Marked severity aortic atherosclerosis. No enlarged abdominal or pelvic lymph nodes. Reproductive: The  prostate gland is moderately enlarged. Other: No abdominal wall hernia or abnormality. No abdominopelvic ascites. Musculoskeletal: The muscular sugar of the anteromedial aspect of the left thigh is heterogeneous in appearance and contains multiple small tortuous vessels (axial CT images 72 through 96, CT series number 2). There is a total right hip replacement with associated streak artifact and subsequently limited evaluation of the adjacent osseous and soft tissue structures. Multilevel degenerative changes seen throughout the lumbar spine with evidence of prior vertebroplasty at the level of the L2 vertebral body. IMPRESSION: 1. Ill-defined areas of low attenuation involving multiple posterior lower lobe branches of the right pulmonary artery. While this may represent areas of artifact, pulmonary embolism cannot be excluded. Correlation with chest CT a is recommended. 2. Mild right basilar atelectasis and/or infiltrate. 3. Evidence of prior cholecystectomy. 4. Large amount of stool throughout the large bowel. 5. Simple cyst within the right kidney. 6. Total right hip replacement. 7. Enlarged prostate gland. Electronically Signed   By: Virgina Norfolk M.D.   On: 07/06/2019 20:01    Procedures Procedures (including critical care time)  Medications Ordered in ED Medications  senna-docusate (Senokot-S) tablet 1 tablet (has no administration in time range)  ceFEPIme (MAXIPIME) 1 g in sodium chloride 0.9 % 100 mL IVPB (has no administration in time range)  vancomycin (VANCOREADY) IVPB 1250 mg/250 mL (has no administration in time range)  sodium chloride 0.9 % bolus 500 mL (0 mLs Intravenous Stopped 07/06/19 2000)  iohexol (OMNIPAQUE) 300 MG/ML solution 100 mL (100 mLs Intravenous Contrast Given 07/06/19 1928)  sodium chloride 0.9 % bolus 500 mL (500 mLs Intravenous New Bag/Given 07/06/19 2114)  iohexol (OMNIPAQUE) 350 MG/ML injection 100 mL (100 mLs Intravenous Contrast Given 07/06/19 2137)    ED Course   I have reviewed the triage vital signs and the nursing notes.  Pertinent labs & imaging results that were available during my care of the patient were reviewed by me and considered in my medical decision making (see chart for details).    MDM Rules/Calculators/A&P  86 year old male presenting for evaluation of decreased urine and stool output.  Patient was discharged from the hospital about 6 days ago and family is concerned that he has not had a BM since he has been home.  He has had a decreased appetite and has not really been eating but has been drinking.  Family has been giving MiraLAX at home but patient has not had a stool in 6 days.  On arrival patient somewhat hypertensive but otherwise vital signs are reassuring.  His abdomen is soft and nontender.  He has normal active bowel sounds.  He does have some wheezing on exam which is likely baseline with his history of COPD.  We will check labs, give fluids and check CT of the abdomen/pelvis to rule out obstruction. CBC w/o leukocytosis, anemia present which is slightly worse than prior BMP w/o electrolyte derangement, normal BUN/Cr. Liver enzymes wnl UA with hematuria  CT abd/pelvis with Ill-defined areas of low attenuation involving multiple posterior lower lobe branches of the right pulmonary artery. While this may represent areas of artifact, pulmonary embolism cannot be excluded. Correlation with chest CT a is recommended. Mild right basilar atelectasis and/or infiltrate. Evidence of prior cholecystectomy. Large amount of stool throughout the large bowel. Simple cyst within the right kidney. 6. Total right hip replacement. Enlarged prostate gland.   CTA chest with no pulmonary emboli. Advanced aortic atherosclerosis. Coronary artery calcification. Pericardial effusion. Opacification of the bronchus intermedia on the right with subtotal collapse/infiltrate in the right lower lobe. Findings are most consistent with  inflammatory disease. Cannot rule out possibility of tumor in this region. Follow-up after treatment will be necessary. Aortic Atherosclerosis (ICD10-I70.0) and Emphysema (ICD10-J43.9).  Had a long discussion with family about admission for iv abx vs discharge home. They are more comfortable with plan for admission. Case discussed with Dr. Francia Greaves who personally evaluated the patient and is in agreement with the plan.   Will admit for iv abx for pna and for further observation. Insetting of recent hospital admission earlier this month, broad spectrum abx given for possible hcap. He does not appear to meet sepsis criteria a this time.   11:20 PM CONSULT with Dr. Maudie Mercury who accepts the patient for admission.   Final Clinical Impression(s) / ED Diagnoses Final diagnoses:  HCAP (healthcare-associated pneumonia)    Rx / DC Orders ED Discharge Orders    None       Rodney Booze, PA-C 07/06/19 2324    Rodney Booze, PA-C 07/06/19 2325    Valarie Merino, MD 07/07/19 [phone]

## 2019-07-06 NOTE — Telephone Encounter (Signed)
Agree with ER eval given the lack of urine output and other symptoms.  I will await the ER notes.  Thanks.

## 2019-07-06 NOTE — Telephone Encounter (Signed)
He has had 2 admission back to back. I wouldn't feel comfortable doing this virtual. I think he needs to be seen in office by his PCP.

## 2019-07-07 ENCOUNTER — Encounter (HOSPITAL_COMMUNITY): Payer: Self-pay | Admitting: Internal Medicine

## 2019-07-07 LAB — HIV ANTIBODY (ROUTINE TESTING W REFLEX): HIV Screen 4th Generation wRfx: NONREACTIVE

## 2019-07-07 LAB — SARS CORONAVIRUS 2 (TAT 6-24 HRS): SARS Coronavirus 2: NEGATIVE

## 2019-07-07 LAB — STREP PNEUMONIAE URINARY ANTIGEN: Strep Pneumo Urinary Antigen: NEGATIVE

## 2019-07-07 LAB — POC SARS CORONAVIRUS 2 AG -  ED: SARS Coronavirus 2 Ag: NEGATIVE

## 2019-07-07 MED ORDER — LEVOTHYROXINE SODIUM 100 MCG PO TABS
100.0000 ug | ORAL_TABLET | Freq: Every day | ORAL | Status: DC
  Administered 2019-07-07 – 2019-07-11 (×4): 100 ug via ORAL
  Filled 2019-07-07 (×4): qty 1

## 2019-07-07 MED ORDER — HYDRALAZINE HCL 25 MG PO TABS
25.0000 mg | ORAL_TABLET | Freq: Three times a day (TID) | ORAL | Status: DC
  Administered 2019-07-07 (×2): 25 mg via ORAL
  Filled 2019-07-07 (×3): qty 1

## 2019-07-07 MED ORDER — IPRATROPIUM-ALBUTEROL 0.5-2.5 (3) MG/3ML IN SOLN
3.0000 mL | Freq: Four times a day (QID) | RESPIRATORY_TRACT | Status: DC
  Administered 2019-07-07 – 2019-07-08 (×4): 3 mL via RESPIRATORY_TRACT
  Filled 2019-07-07 (×4): qty 3

## 2019-07-07 MED ORDER — ACETAMINOPHEN 325 MG PO TABS
650.0000 mg | ORAL_TABLET | Freq: Four times a day (QID) | ORAL | Status: DC | PRN
  Filled 2019-07-07: qty 2

## 2019-07-07 MED ORDER — SODIUM CHLORIDE 0.9 % IV SOLN
3.0000 g | Freq: Three times a day (TID) | INTRAVENOUS | Status: DC
  Administered 2019-07-07 – 2019-07-11 (×11): 3 g via INTRAVENOUS
  Filled 2019-07-07: qty 8
  Filled 2019-07-07: qty 3
  Filled 2019-07-07: qty 8
  Filled 2019-07-07 (×3): qty 3
  Filled 2019-07-07 (×2): qty 8
  Filled 2019-07-07: qty 3
  Filled 2019-07-07: qty 8
  Filled 2019-07-07 (×3): qty 3
  Filled 2019-07-07: qty 8
  Filled 2019-07-07: qty 3

## 2019-07-07 MED ORDER — ASPIRIN 81 MG PO CHEW
81.0000 mg | CHEWABLE_TABLET | Freq: Every day | ORAL | Status: DC
  Administered 2019-07-07 – 2019-07-11 (×4): 81 mg via ORAL
  Filled 2019-07-07 (×4): qty 1

## 2019-07-07 MED ORDER — SODIUM CHLORIDE 3 % IN NEBU
4.0000 mL | INHALATION_SOLUTION | Freq: Two times a day (BID) | RESPIRATORY_TRACT | Status: DC
  Filled 2019-07-07 (×3): qty 4

## 2019-07-07 MED ORDER — PANTOPRAZOLE SODIUM 40 MG PO TBEC
40.0000 mg | DELAYED_RELEASE_TABLET | Freq: Every day | ORAL | Status: DC
  Administered 2019-07-07 – 2019-07-10 (×3): 40 mg via ORAL
  Filled 2019-07-07 (×3): qty 1

## 2019-07-07 MED ORDER — BISACODYL 10 MG RE SUPP
10.0000 mg | Freq: Once | RECTAL | Status: AC
  Administered 2019-07-07: 10 mg via RECTAL
  Filled 2019-07-07: qty 1

## 2019-07-07 MED ORDER — QUETIAPINE FUMARATE 25 MG PO TABS
12.5000 mg | ORAL_TABLET | Freq: Every day | ORAL | Status: DC
  Administered 2019-07-07: 12.5 mg via ORAL
  Filled 2019-07-07 (×2): qty 1

## 2019-07-07 MED ORDER — SENNOSIDES-DOCUSATE SODIUM 8.6-50 MG PO TABS
1.0000 | ORAL_TABLET | Freq: Two times a day (BID) | ORAL | Status: DC
  Administered 2019-07-07 – 2019-07-10 (×6): 1 via ORAL
  Filled 2019-07-07 (×6): qty 1

## 2019-07-07 MED ORDER — SODIUM CHLORIDE 0.9 % IV SOLN: INTRAVENOUS | Status: AC

## 2019-07-07 MED ORDER — POLYETHYLENE GLYCOL 3350 17 G PO PACK
17.0000 g | PACK | Freq: Every day | ORAL | Status: DC
  Administered 2019-07-07 – 2019-07-09 (×2): 17 g via ORAL
  Filled 2019-07-07 (×2): qty 1

## 2019-07-07 NOTE — ED Notes (Signed)
Called bed placement to see why pt did not have a bed.  They stated 2W is cleaning the room and the pt should have a bed within an hour.

## 2019-07-07 NOTE — ED Notes (Signed)
Incentive spirometer performed.

## 2019-07-07 NOTE — ED Notes (Signed)
ED TO INPATIENT HANDOFF REPORT  ED Nurse Name and Phone #: Ezana Hubbert 73  S Name/Age/Gender [name] 86 y.o. male Room/Bed: 011C/011C  Code Status   Code Status: DNR  Home/SNF/Other Home Patient oriented to: self Is this baseline? Yes  Triage Complete: Triage complete  Chief Complaint HCAP (healthcare-associated pneumonia) [J18.9] Pneumonia [J18.9]  Triage Note Pt had surgery last Friday. Endorses constipation and decrease in appetite and output since then. Denies abd pain. EMS not sure what surgery pt had. Pt states he had hip and knee surgery.     Allergies Allergies  Allergen Reactions  . Codeine Nausea And Vomiting    Level of Care/Admitting Diagnosis ED Disposition    ED Disposition Condition Comment   Admit  Hospital Area: [facility] [[id number]]  Level of Care: Telemetry Medical [[id number]]  Covid Evaluation: Confirmed COVID Negative  Diagnosis: Pneumonia [[id number]]  Admitting Physician: Jani Gravel [[id number]]  Attending Physician: Jani Gravel [phone]  Estimated length of stay: past midnight tomorrow  Certification:: I certify this patient will need inpatient services for at least 2 midnights       B Medical/Surgery History Past Medical History:  Diagnosis Date  . Anemia   . Bradycardia   . COPD (chronic obstructive pulmonary disease) (HCC)    Mild  . HTN (hypertension)   . Hypothyroidism    Past Surgical History:  Procedure Laterality Date  . CATARACT EXTRACTION Bilateral   . GALLBLADDER SURGERY  02/21/2011  . OTHER SURGICAL HISTORY     finger right pointer--laceration repair  . PACEMAKER INSERTION     at [facility]   . TOTAL HIP ARTHROPLASTY       A IV Location/Drains/Wounds Patient Lines/Drains/Airways Status   Active Line/Drains/Airways    Name:   Placement date:   Placement time:   Site:   Days:   Peripheral IV 07/06/19 Right Hand   07/06/19    --    Hand   1   Peripheral IV 07/06/19 Right Antecubital   07/06/19    --     Antecubital   1   External Urinary Catheter   03/23/18    0208    --   471          Intake/Output Last 24 hours  Intake/Output Summary (Last 24 hours) at 07/07/2019 1952 Last data filed at 07/07/2019 1546 Gross per 24 hour  Intake 699.66 ml  Output --  Net 699.66 ml    Labs/Imaging Results for orders placed or performed during the hospital encounter of 07/06/19 (from the past 48 hour(s))  CBC     Status: Abnormal   Collection Time: 07/06/19 11:16 AM  Result Value Ref Range   WBC 6.3 4.0 - 10.5 K/uL   RBC 3.38 (L) 4.22 - 5.81 MIL/uL   Hemoglobin 10.5 (L) 13.0 - 17.0 g/dL   HCT 33.2 (L) 39.0 - 52.0 %   MCV 98.2 80.0 - 100.0 fL   MCH 31.1 26.0 - 34.0 pg   MCHC 31.6 30.0 - 36.0 g/dL   RDW 13.2 11.5 - 15.5 %   Platelets 161 150 - 400 K/uL   nRBC 0.0 0.0 - 0.2 %    Comment: Performed at [facility], [place] [address], [place], [place] [address]  Basic metabolic panel     Status: Abnormal   Collection Time: 07/06/19 11:16 AM  Result Value Ref Range   Sodium 138 135 - 145 mmol/L   Potassium 3.8 3.5 - 5.1 mmol/L  Chloride 106 98 - 111 mmol/L   CO2 23 22 - 32 mmol/L   Glucose, Bld 88 70 - 99 mg/dL   BUN 20 8 - 23 mg/dL   Creatinine, Ser 1.13 0.61 - 1.24 mg/dL   Calcium 8.4 (L) 8.9 - 10.3 mg/dL   GFR calc non Af Amer 56 (L) >60 mL/min   GFR calc Af Amer >60 >60 mL/min   Anion gap 9 5 - 15    Comment: Performed at [facility] [address] [place], [place] [address]  Hepatic function panel     Status: Abnormal   Collection Time: 07/06/19 11:55 AM  Result Value Ref Range   Total Protein 6.3 (L) 6.5 - 8.1 g/dL   Albumin 2.7 (L) 3.5 - 5.0 g/dL   AST 37 15 - 41 U/L   ALT 18 0 - 44 U/L   Alkaline Phosphatase 59 38 - 126 U/L   Total Bilirubin 0.5 0.3 - 1.2 mg/dL   Bilirubin, Direct 0.2 0.0 - 0.2 mg/dL   Indirect Bilirubin 0.3 0.3 - 0.9 mg/dL    Comment: Performed at [facility] [address] [place], [place] [address]  Urinalysis, Routine w reflex  microscopic     Status: Abnormal   Collection Time: 07/06/19 10:01 PM  Result Value Ref Range   Color, Urine YELLOW YELLOW   APPearance CLEAR CLEAR   Specific Gravity, Urine 1.034 (H) 1.005 - 1.030   pH 5.0 5.0 - 8.0   Glucose, UA NEGATIVE NEGATIVE mg/dL   Hgb urine dipstick MODERATE (A) NEGATIVE   Bilirubin Urine NEGATIVE NEGATIVE   Ketones, ur 5 (A) NEGATIVE mg/dL   Protein, ur NEGATIVE NEGATIVE mg/dL   Nitrite NEGATIVE NEGATIVE   Leukocytes,Ua NEGATIVE NEGATIVE   RBC / HPF 0-5 0 - 5 RBC/hpf   WBC, UA 0-5 0 - 5 WBC/hpf   Bacteria, UA RARE (A) NONE SEEN   Mucus PRESENT     Comment: Performed at [facility] [address] [place], [place] [address]  SARS CORONAVIRUS 2 (TAT 6-24 HRS) Nasopharyngeal Nasopharyngeal Swab     Status: None   Collection Time: 07/06/19 11:26 PM   Specimen: Nasopharyngeal Swab  Result Value Ref Range   SARS Coronavirus 2 NEGATIVE NEGATIVE    Comment: (NOTE) SARS-CoV-2 target nucleic acids are NOT DETECTED. The SARS-CoV-2 RNA is generally detectable in upper and lower respiratory specimens during the acute phase of infection. Negative results do not preclude SARS-CoV-2 infection, do not rule out co-infections with other pathogens, and should not be used as the sole basis for treatment or other patient management decisions. Negative results must be combined with clinical observations, patient history, and epidemiological information. The expected result is Negative. Fact Sheet for Patients: SugarRoll.be Fact Sheet for Healthcare Providers: https://www.woods-mathews.com/ This test is not yet approved or cleared by the [place] FDA and  has been authorized for detection and/or diagnosis of SARS-CoV-2 by FDA under an Emergency Use Authorization (EUA). This EUA will remain  in effect (meaning this test can be used) for the duration of the COVID-19 declaration under Section 56 4(b)(1) of the Act, 21  U.S.C. section 360bbb-3(b)(1), unless the authorization is terminated or revoked sooner. Performed at [facility], [place] [address] [place], [place] [address]   Blood culture (routine x 2)     Status: None (Preliminary result)   Collection Time: 07/07/19 12:05 AM   Specimen: BLOOD  Result Value Ref Range  Specimen Description BLOOD RIGHT ANTECUBITAL    Special Requests      BOTTLES DRAWN AEROBIC AND ANAEROBIC Blood Culture adequate volume   Culture      NO GROWTH < 24 HOURS Performed at [facility], [place] [address], [place], [place] [address]    Report Status PENDING   Blood culture (routine x 2)     Status: None (Preliminary result)   Collection Time: 07/07/19 12:10 AM   Specimen: BLOOD  Result Value Ref Range   Specimen Description BLOOD LEFT ANTECUBITAL    Special Requests      BOTTLES DRAWN AEROBIC AND ANAEROBIC Blood Culture results may not be optimal due to an inadequate volume of blood received in culture bottles   Culture      NO GROWTH < 24 HOURS Performed at [facility] [address], [place], [place] [address]    Report Status PENDING   Strep pneumoniae urinary antigen     Status: None   Collection Time: 07/07/19 12:10 AM  Result Value Ref Range   Strep Pneumo Urinary Antigen NEGATIVE NEGATIVE    Comment:        Infection due to S. pneumoniae cannot be absolutely ruled out since the antigen present may be below the detection limit of the test.   POC SARS Coronavirus 2 Ag-ED - Nasal Swab (BD Veritor Kit)     Status: None   Collection Time: 07/07/19 12:51 AM  Result Value Ref Range   SARS Coronavirus 2 Ag NEGATIVE NEGATIVE    Comment: (NOTE) SARS-CoV-2 antigen NOT DETECTED.  Negative results are presumptive.  Negative results do not preclude SARS-CoV-2 infection and should not be used as the sole basis for treatment or other patient management decisions, including infection  control decisions, particularly in the presence of clinical  signs and  symptoms consistent with COVID-19, or in those who have been in contact with the virus.  Negative results must be combined with clinical observations, patient history, and epidemiological information. The expected result is Negative. Fact Sheet for Patients: PodPark.tn Fact Sheet for Healthcare Providers: GiftContent.is This test is not yet approved or cleared by the [place] FDA and  has been authorized for detection and/or diagnosis of SARS-CoV-2 by FDA under an Emergency Use Authorization (EUA).  This EUA will remain in effect (meaning this test can be used) for the duration of  the COVID-19 de claration under Section 564(b)(1) of the Act, 21 U.S.C. section 360bbb-3(b)(1), unless the authorization is terminated or revoked sooner.   HIV Antibody (routine testing w rflx)     Status: None   Collection Time: 07/07/19  5:00 AM  Result Value Ref Range   HIV Screen 4th Generation wRfx NON REACTIVE NON REACTIVE    Comment: Performed at [facility], [address], [place], [place] [address]   CT Angio Chest PE W and/or Wo Contrast  Result Date: 07/06/2019 CLINICAL DATA:  Pulmonary emboli suspected in the right lower lobe at abdominal CT. EXAM: CT ANGIOGRAPHY CHEST WITH CONTRAST TECHNIQUE: Multidetector CT imaging of the chest was performed using the standard protocol during bolus administration of intravenous contrast. Multiplanar CT image reconstructions and MIPs were obtained to evaluate the vascular anatomy. CONTRAST:  140m OMNIPAQUE IOHEXOL 350 MG/ML SOLN COMPARISON:  Abdominal CT same day FINDINGS: Cardiovascular: Pulmonary arterial opacification is good. There are no pulmonary emboli. Specific attention to the right lower lobe pulmonary arterial tree does not show embolic disease. Heart size is normal.  The patient has a pericardial effusion. There is extensive coronary artery calcification. There is advanced  atherosclerotic disease of the thoracic aorta. Mediastinum/Nodes: No paratracheal adenopathy. Mild nodal prominence in the Peri carinal region, possibly reactive. Lungs/Pleura: Upper lungs show emphysema and mild scarring. There is mild atelectasis at the left lung base. The right mainstem bronchus is patent as is the right upper lobe bronchus. The bronchus intermedia is opacified. There is central volume loss in the right lower lobe. All of this may be inflammatory disease, but I cannot rule out neoplastic disease particularly in the right lower lobe. Follow-up will be necessary after treatment. Upper Abdomen: Negative Musculoskeletal: Ordinary spinal degenerative changes. Review of the MIP images confirms the above findings. IMPRESSION: No pulmonary emboli. Advanced aortic atherosclerosis. Coronary artery calcification. Pericardial effusion. Opacification of the bronchus intermedia on the right with subtotal collapse/infiltrate in the right lower lobe. Findings are most consistent with inflammatory disease. Cannot rule out possibility of tumor in this region. Follow-up after treatment will be necessary. Aortic Atherosclerosis (ICD10-I70.0) and Emphysema (ICD10-J43.9). Electronically Signed   By: Nelson Chimes M.D.   On: 07/06/2019 21:55   CT ABDOMEN PELVIS W CONTRAST  Result Date: 07/06/2019 CLINICAL DATA:  Constipation. EXAM: CT ABDOMEN AND PELVIS WITH CONTRAST TECHNIQUE: Multidetector CT imaging of the abdomen and pelvis was performed using the standard protocol following bolus administration of intravenous contrast. CONTRAST:  161m OMNIPAQUE IOHEXOL 300 MG/ML  SOLN COMPARISON:  None. FINDINGS: Lower chest: Mild atelectasis and/or infiltrate is seen within the right lung base. There is a small pericardial effusion. Ill-defined filling defects are suspected within the posterior lower lobe branches of the right pulmonary artery. Hepatobiliary: No focal liver abnormality is seen. Status post cholecystectomy. No  biliary dilatation. Pancreas: Unremarkable. No pancreatic ductal dilatation or surrounding inflammatory changes. Spleen: An 8 mm cystic appearing areas seen within an otherwise normal-appearing spleen. Adrenals/Urinary Tract: The adrenal glands are slightly nodular in appearance. Kidneys are normal in size, without renal calculi or hydronephrosis. A 1.8 cm simple cyst is seen within the lower pole of the right kidney. Bladder is unremarkable. Stomach/Bowel: Stomach is within normal limits. The appendix is not identified. No evidence of bowel dilatation. A large amount of stool is seen throughout the colon. Noninflamed diverticula are seen throughout the sigmoid colon. Vascular/Lymphatic: Marked severity aortic atherosclerosis. No enlarged abdominal or pelvic lymph nodes. Reproductive: The prostate gland is moderately enlarged. Other: No abdominal wall hernia or abnormality. No abdominopelvic ascites. Musculoskeletal: The muscular sugar of the anteromedial aspect of the left thigh is heterogeneous in appearance and contains multiple small tortuous vessels (axial CT images 72 through 96, CT series number 2). There is a total right hip replacement with associated streak artifact and subsequently limited evaluation of the adjacent osseous and soft tissue structures. Multilevel degenerative changes seen throughout the lumbar spine with evidence of prior vertebroplasty at the level of the L2 vertebral body. IMPRESSION: 1. Ill-defined areas of low attenuation involving multiple posterior lower lobe branches of the right pulmonary artery. While this may represent areas of artifact, pulmonary embolism cannot be excluded. Correlation with chest CT a is recommended. 2. Mild right basilar atelectasis and/or infiltrate. 3. Evidence of prior cholecystectomy. 4. Large amount of stool throughout the large bowel. 5. Simple cyst within the right kidney. 6. Total right hip replacement. 7. Enlarged prostate gland. Electronically Signed    By: TVirgina NorfolkM.D.   On: 07/06/2019 20:01    Pending Labs Unresulted Labs (From admission, onward)  Start     Ordered   07/08/19 0500  CBC  Tomorrow morning,   R     07/07/19 1120   07/08/19 3244  Basic metabolic panel  Tomorrow morning,   R     07/07/19 1120   07/06/19 2339  Legionella Pneumophila Serogp 1 Ur Ag  Once,   STAT     07/06/19 2338   07/06/19 1747  Urine culture  ONCE - STAT,   STAT     07/06/19 1747          Vitals/Pain Today's Vitals   07/07/19 1200 07/07/19 1400 07/07/19 1800 07/07/19 1924  BP: 119/73 (!) 147/91 109/71   Pulse: 85 92 91 81  Resp: (!) 22 (!) 27 20 (!) 24  Temp:      TempSrc:      SpO2: 97% 99% 97% 94%  Weight:      Height:      PainSc:        Isolation Precautions No active isolations  Medications Medications  senna-docusate (Senokot-S) tablet 1 tablet (1 tablet Oral Given 07/07/19 1523)  polyethylene glycol (MIRALAX / GLYCOLAX) packet 17 g (17 g Oral Given 07/07/19 1601)  Ampicillin-Sulbactam (UNASYN) 3 g in sodium chloride 0.9 % 100 mL IVPB (0 g Intravenous Stopped 07/07/19 1546)  pantoprazole (PROTONIX) EC tablet 40 mg (40 mg Oral Given 07/07/19 1524)  levothyroxine (SYNTHROID) tablet 100 mcg (100 mcg Oral Given 07/07/19 1601)  QUEtiapine (SEROQUEL) tablet 12.5 mg (has no administration in time range)  aspirin chewable tablet 81 mg (81 mg Oral Given 07/07/19 1601)  0.9 %  sodium chloride infusion ( Intravenous New Bag/Given 07/07/19 1553)  hydrALAZINE (APRESOLINE) tablet 25 mg (25 mg Oral Given 07/07/19 1523)  ipratropium-albuterol (DUONEB) 0.5-2.5 (3) MG/3ML nebulizer solution 3 mL (3 mLs Nebulization Given 07/07/19 1820)  sodium chloride HYPERTONIC 3 % nebulizer solution 4 mL (has no administration in time range)  acetaminophen (TYLENOL) tablet 650 mg (has no administration in time range)  sodium chloride 0.9 % bolus 500 mL (0 mLs Intravenous Stopped 07/06/19 2000)  iohexol (OMNIPAQUE) 300 MG/ML solution 100 mL (100 mLs  Intravenous Contrast Given 07/06/19 1928)  sodium chloride 0.9 % bolus 500 mL (0 mLs Intravenous Stopped 07/07/19 0812)  iohexol (OMNIPAQUE) 350 MG/ML injection 100 mL (100 mLs Intravenous Contrast Given 07/06/19 2137)  ceFEPIme (MAXIPIME) 1 g in sodium chloride 0.9 % 100 mL IVPB (0 g Intravenous Stopped 07/07/19 0047)  vancomycin (VANCOREADY) IVPB 1250 mg/250 mL (0 mg Intravenous Stopped 07/07/19 0200)  bisacodyl (DULCOLAX) suppository 10 mg (10 mg Rectal Given 07/07/19 1524)    Mobility walks with device High fall risk   Focused Assessments Pulmonary Assessment Handoff:  Lung sounds:   O2 Device: Room Air        R Recommendations: See Admitting Provider Note  Report given to:   Additional Notes:

## 2019-07-07 NOTE — Progress Notes (Addendum)
PROGRESS NOTE    [name] Krahl Sr.  [id number] DOB: [birth year] DOA: 07/06/2019 PCP: Tonia Ghent, MD  Brief Narrative: 86 year old male with history of severe COPD, chronic diastolic CHF, chronic dysphagia, history of recurrent pneumonia was admitted to [facility] from 12/30-1/1 with severe bradycardia and second-degree AV block's, he had a pacemaker placed at that time, subsequently readmitted to [facility] from 1/3-1/15 with acute hypoxic respiratory failure he was diagnosed with Covid on 1/4 and treated for this with IV remdesivir and Decadron, he was also treated for community-acquired pneumonia. -He was discharged home a week ago from [facility] and presented to our emergency room with decreased urine output, constipation, weakness -In the emergency room labs were unremarkable, vital signs were stable, CT abdomen pelvis concerning for constipation, CT angiogram chest noted opacification of bronchus intermedius on the right with subtotal collapse/infiltrate in the right lower lobe. -SLP evaluation 1/22: Moderate to severe aspiration risk, n.p.o. recommended, meds in applesauce   Assessment & Plan:   Aspiration pneumonia RLL Collapse Dysphagia -Longstanding history of dysphagia for few years, likely worsened now -SLP following, n.p.o. recommended today, they will follow and reassess -Start gentle IV fluids -Stop vancomycin and Zosyn, start IV Unasyn -discussed with Pulm Dr.McQuaid regarding right lower lobe collapse, he felt pt was too frail for Bronch, recommended flutter valve and hypertonic saline nebs  Constipation -X1 week per daughter -CT with evidence of large amount of stool -Add laxatives, suppository today -Enema if needed  Recent Covid -Positive for Covid on 1/4-hospitalized at [facility], treated with IV remdesivir and Decadron, will complete 21-day isolation on [id number]  Chronic diastolic CHF -Hold torsemide today, continue hydralazine at lower  dose  COPD -Stable, continue albuterol HFA as needed  Hypothyroidism -Continue Synthroid  Skin cancer/neck lesion -Follow-up with dermatology  DVT prophylaxis: Lovenox  Code Status: DNR, I discussed CODE STATUS with patient, he told me he would not want to be shocked or put on life support, called and discussed this with patient's daughter [name] who agrees with DNR Family Communication: No family at bedside, called and discussed with daughter Disposition Plan: Home pending improvement in above pneumonia, aspiration  Consultants:      Procedures:   Antimicrobials:    Subjective: -Failed swallowing assessment this morning, patient denies any complaints, tells me he has not had a bowel movement yet but was able to urinate couple of times Objective: Vitals:   07/07/19 0600 07/07/19 0630 07/07/19 0645 07/07/19 0700  BP: 132/64 133/65 139/63 (!) 134/55  Pulse:      Resp: (!) 21 (!) 22 16 18   Temp:      TempSrc:      SpO2:        Intake/Output Summary (Last 24 hours) at 07/07/2019 1101 Last data filed at 07/07/2019 [id number] Gross per 24 hour  Intake 500 ml  Output --  Net 500 ml   There were no vitals filed for this visit.  Examination:  General exam: Elderly frail chronically ill male sitting up in bed, awake alert oriented to self and place HEENT: Skin cancer in his left neck Respiratory system: Poor air movement, decreased at both bases, scattered rhonchi on the right Cardiovascular system: S1 & S2 heard, RRR.  Gastrointestinal system: Abdomen is nondistended, soft and nontender.Normal bowel sounds heard. Central nervous system: Alert and oriented. No focal neurological deficits. Extremities: No edema Skin: No rashes Psychiatry: Mood & affect appropriate.     Data Reviewed:   CBC: Recent Labs  Lab 07/06/19 1116  WBC 6.3  HGB 10.5*  HCT 33.2*  MCV 98.2  PLT [id number]   Basic Metabolic Panel: Recent Labs  Lab 07/06/19 1116  NA 138  K 3.8  CL 106  CO2 23   GLUCOSE 88  BUN 20  CREATININE 1.13  CALCIUM 8.4*   GFR: CrCl cannot be calculated (Unknown ideal weight.). Liver Function Tests: Recent Labs  Lab 07/06/19 1155  AST 37  ALT 18  ALKPHOS 59  BILITOT 0.5  PROT 6.3*  ALBUMIN 2.7*   No results for input(s): LIPASE, AMYLASE in the last 168 hours. No results for input(s): AMMONIA in the last 168 hours. Coagulation Profile: No results for input(s): INR, PROTIME in the last 168 hours. Cardiac Enzymes: No results for input(s): CKTOTAL, CKMB, CKMBINDEX, TROPONINI in the last 168 hours. BNP (last 3 results) No results for input(s): PROBNP in the last 8760 hours. HbA1C: No results for input(s): HGBA1C in the last 72 hours. CBG: No results for input(s): GLUCAP in the last 168 hours. Lipid Profile: No results for input(s): CHOL, HDL, LDLCALC, TRIG, CHOLHDL, LDLDIRECT in the last 72 hours. Thyroid Function Tests: No results for input(s): TSH, T4TOTAL, FREET4, T3FREE, THYROIDAB in the last 72 hours. Anemia Panel: No results for input(s): VITAMINB12, FOLATE, FERRITIN, TIBC, IRON, RETICCTPCT in the last 72 hours. Urine analysis:    Component Value Date/Time   COLORURINE YELLOW 07/06/2019 2201   APPEARANCEUR CLEAR 07/06/2019 2201   LABSPEC 1.034 (H) 07/06/2019 2201   PHURINE 5.0 07/06/2019 2201   GLUCOSEU NEGATIVE 07/06/2019 2201   GLUCOSEU NEGATIVE 05/26/2018 1716   HGBUR MODERATE (A) 07/06/2019 2201   BILIRUBINUR NEGATIVE 07/06/2019 2201   KETONESUR 5 (A) 07/06/2019 2201   PROTEINUR NEGATIVE 07/06/2019 2201   UROBILINOGEN 0.2 05/26/2018 1716   NITRITE NEGATIVE 07/06/2019 2201   LEUKOCYTESUR NEGATIVE 07/06/2019 2201   Sepsis Labs: @LABRCNTIP (procalcitonin:4,lacticidven:4)  ) Recent Results (from the past 240 hour(s))  SARS CORONAVIRUS 2 (TAT 6-24 HRS) Nasopharyngeal Nasopharyngeal Swab     Status: None   Collection Time: 07/06/19 11:26 PM   Specimen: Nasopharyngeal Swab  Result Value Ref Range Status   SARS Coronavirus 2  NEGATIVE NEGATIVE Final    Comment: (NOTE) SARS-CoV-2 target nucleic acids are NOT DETECTED. The SARS-CoV-2 RNA is generally detectable in upper and lower respiratory specimens during the acute phase of infection. Negative results do not preclude SARS-CoV-2 infection, do not rule out co-infections with other pathogens, and should not be used as the sole basis for treatment or other patient management decisions. Negative results must be combined with clinical observations, patient history, and epidemiological information. The expected result is Negative. Fact Sheet for Patients: SugarRoll.be Fact Sheet for Healthcare Providers: https://www.woods-mathews.com/ This test is not yet approved or cleared by the [place] FDA and  has been authorized for detection and/or diagnosis of SARS-CoV-2 by FDA under an Emergency Use Authorization (EUA). This EUA will remain  in effect (meaning this test can be used) for the duration of the COVID-19 declaration under Section 56 4(b)(1) of the Act, 21 U.S.C. section 360bbb-3(b)(1), unless the authorization is terminated or revoked sooner. Performed at [facility], [place] [address], [place], [place] [address]   Blood culture (routine x 2)     Status: None (Preliminary result)   Collection Time: 07/07/19 12:05 AM   Specimen: BLOOD  Result Value Ref Range Status   Specimen Description BLOOD RIGHT ANTECUBITAL  Final   Special Requests   Final    BOTTLES DRAWN AEROBIC AND  ANAEROBIC Blood Culture adequate volume   Culture   Final    NO GROWTH < 12 HOURS Performed at [facility] [address], [place], [place] [address]    Report Status PENDING  Incomplete  Blood culture (routine x 2)     Status: None (Preliminary result)   Collection Time: 07/07/19 12:10 AM   Specimen: BLOOD  Result Value Ref Range Status   Specimen Description BLOOD LEFT ANTECUBITAL  Final   Special Requests   Final     BOTTLES DRAWN AEROBIC AND ANAEROBIC Blood Culture results may not be optimal due to an inadequate volume of blood received in culture bottles   Culture   Final    NO GROWTH < 12 HOURS Performed at [facility], [place] [address], [place], [place] [address]    Report Status PENDING  Incomplete         Radiology Studies: CT Angio Chest PE W and/or Wo Contrast  Result Date: 07/06/2019 CLINICAL DATA:  Pulmonary emboli suspected in the right lower lobe at abdominal CT. EXAM: CT ANGIOGRAPHY CHEST WITH CONTRAST TECHNIQUE: Multidetector CT imaging of the chest was performed using the standard protocol during bolus administration of intravenous contrast. Multiplanar CT image reconstructions and MIPs were obtained to evaluate the vascular anatomy. CONTRAST:  159mL OMNIPAQUE IOHEXOL 350 MG/ML SOLN COMPARISON:  Abdominal CT same day FINDINGS: Cardiovascular: Pulmonary arterial opacification is good. There are no pulmonary emboli. Specific attention to the right lower lobe pulmonary arterial tree does not show embolic disease. Heart size is normal. The patient has a pericardial effusion. There is extensive coronary artery calcification. There is advanced atherosclerotic disease of the thoracic aorta. Mediastinum/Nodes: No paratracheal adenopathy. Mild nodal prominence in the Peri carinal region, possibly reactive. Lungs/Pleura: Upper lungs show emphysema and mild scarring. There is mild atelectasis at the left lung base. The right mainstem bronchus is patent as is the right upper lobe bronchus. The bronchus intermedia is opacified. There is central volume loss in the right lower lobe. All of this may be inflammatory disease, but I cannot rule out neoplastic disease particularly in the right lower lobe. Follow-up will be necessary after treatment. Upper Abdomen: Negative Musculoskeletal: Ordinary spinal degenerative changes. Review of the MIP images confirms the above findings. IMPRESSION: No pulmonary emboli.  Advanced aortic atherosclerosis. Coronary artery calcification. Pericardial effusion. Opacification of the bronchus intermedia on the right with subtotal collapse/infiltrate in the right lower lobe. Findings are most consistent with inflammatory disease. Cannot rule out possibility of tumor in this region. Follow-up after treatment will be necessary. Aortic Atherosclerosis (ICD10-I70.0) and Emphysema (ICD10-J43.9). Electronically Signed   By: Nelson Chimes M.D.   On: 07/06/2019 21:55   CT ABDOMEN PELVIS W CONTRAST  Result Date: 07/06/2019 CLINICAL DATA:  Constipation. EXAM: CT ABDOMEN AND PELVIS WITH CONTRAST TECHNIQUE: Multidetector CT imaging of the abdomen and pelvis was performed using the standard protocol following bolus administration of intravenous contrast. CONTRAST:  124mL OMNIPAQUE IOHEXOL 300 MG/ML  SOLN COMPARISON:  None. FINDINGS: Lower chest: Mild atelectasis and/or infiltrate is seen within the right lung base. There is a small pericardial effusion. Ill-defined filling defects are suspected within the posterior lower lobe branches of the right pulmonary artery. Hepatobiliary: No focal liver abnormality is seen. Status post cholecystectomy. No biliary dilatation. Pancreas: Unremarkable. No pancreatic ductal dilatation or surrounding inflammatory changes. Spleen: An 8 mm cystic appearing areas seen within an otherwise normal-appearing spleen. Adrenals/Urinary Tract: The adrenal glands are slightly nodular in appearance. Kidneys  are normal in size, without renal calculi or hydronephrosis. A 1.8 cm simple cyst is seen within the lower pole of the right kidney. Bladder is unremarkable. Stomach/Bowel: Stomach is within normal limits. The appendix is not identified. No evidence of bowel dilatation. A large amount of stool is seen throughout the colon. Noninflamed diverticula are seen throughout the sigmoid colon. Vascular/Lymphatic: Marked severity aortic atherosclerosis. No enlarged abdominal or pelvic  lymph nodes. Reproductive: The prostate gland is moderately enlarged. Other: No abdominal wall hernia or abnormality. No abdominopelvic ascites. Musculoskeletal: The muscular sugar of the anteromedial aspect of the left thigh is heterogeneous in appearance and contains multiple small tortuous vessels (axial CT images 72 through 96, CT series number 2). There is a total right hip replacement with associated streak artifact and subsequently limited evaluation of the adjacent osseous and soft tissue structures. Multilevel degenerative changes seen throughout the lumbar spine with evidence of prior vertebroplasty at the level of the L2 vertebral body. IMPRESSION: 1. Ill-defined areas of low attenuation involving multiple posterior lower lobe branches of the right pulmonary artery. While this may represent areas of artifact, pulmonary embolism cannot be excluded. Correlation with chest CT a is recommended. 2. Mild right basilar atelectasis and/or infiltrate. 3. Evidence of prior cholecystectomy. 4. Large amount of stool throughout the large bowel. 5. Simple cyst within the right kidney. 6. Total right hip replacement. 7. Enlarged prostate gland. Electronically Signed   By: Virgina Norfolk M.D.   On: 07/06/2019 20:01        Scheduled Meds: . enoxaparin (LOVENOX) injection  40 mg Subcutaneous Q24H  . polyethylene glycol  17 g Oral Daily  . senna-docusate  1 tablet Oral BID   Continuous Infusions: . piperacillin-tazobactam (ZOSYN)  IV 3.375 g (07/07/19 0843)  . vancomycin       LOS: 1 day    Time spent: 56min  Domenic Polite, MD [facility]  07/07/2019, 11:01 AM

## 2019-07-07 NOTE — ED Notes (Signed)
Pt performed incentive spirometer.

## 2019-07-07 NOTE — ED Notes (Signed)
Incentive spirometer. Pt only able to breath in 3 sec - 7 times, then states he is done.

## 2019-07-07 NOTE — H&P (Signed)
TRH H&P    Patient Demographics:    [name] [name], is a 86 y.o. male  MRN: [id number]  DOB - August 08, 1924  Admit Date - 07/06/2019  Referring MD/NP/PA:  Tivis Ringer  Outpatient Primary MD for the patient is Tonia Ghent, MD  Patient coming from:  home  Chief complaint-   cough   HPI:    [name] [name]  is a 86 y.o. male, w Hypothyroidism, Jerrye Bushy,  Chronic dysphagia, Anemia,  Hypertension, s/p pacer for bradycardia 2020, ,  Copd,  h/o covid-19  w RLL pneumonia, 06/19/19-06/30/19, presents for lack of urination and constipation but notes cough w green sputum.   Pt denies fever, chills, cp, palp, sob beyond his baseline, n/v, diarrhea, brbpr, black stool.    Daughter states that he developed swallowing issues after hip surgery.  Pt previously had PEG tube in 2019,  Removed in 06/2018 per daughter. ???   In Ed,  T 98.6, P 73, R 18, Bp 134/65  Pox 94% on RA  CTA Chest IMPRESSION: No pulmonary emboli.  Advanced aortic atherosclerosis. Coronary artery calcification. Pericardial effusion.  Opacification of the bronchus intermedia on the right with subtotal collapse/infiltrate in the right lower lobe. Findings are most consistent with inflammatory disease. Cannot rule out possibility of tumor in this region. Follow-up after treatment will be necessary.  Na 138, K 3.8,  Bun 20, Creatinine 1.13 Alb 2.7 Ast 37, Alt 18 Urinalysis  Negative  Blood culture x2 pending  Pt will be admitted for aspiration pneumonia, hcap.        Review of systems:    In addition to the HPI above,  No Fever-chills, No Headache, No changes with Vision or hearing, No problems swallowing food or Liquids, No Chest pain,   No Abdominal pain, No Nausea or Vomiting No Blood in stool or Urine, No dysuria, No new skin rashes or bruises, No new joints pains-aches,  No new weakness, tingling, numbness in any extremity, No recent  weight gain or loss, No polyuria, polydypsia or polyphagia, No significant Mental Stressors.  All other systems reviewed and are negative.    Past History of the following :    Past Medical History:  Diagnosis Date  . Anemia   . COPD (chronic obstructive pulmonary disease) (HCC)    Mild  . HTN (hypertension)   . Hypothyroidism       Past Surgical History:  Procedure Laterality Date  . CATARACT EXTRACTION Bilateral   . GALLBLADDER SURGERY  02/21/2011  . OTHER SURGICAL HISTORY     finger right pointer--laceration repair  . TOTAL HIP ARTHROPLASTY        Social History:      Social History   Tobacco Use  . Smoking status: Former Smoker    Packs/day: 1.50    Years: 25.00    Pack years: 37.50    Types: Cigarettes    Quit date: 06/16/1971    Years since quitting: 48.0  . Smokeless tobacco: Never Used  . Tobacco comment: started at 86 years old  Substance Use  Topics  . Alcohol use: No       Family History :     Family History  Problem Relation Age of Onset  . Other Father        enlarged heart  . Lung disease Mother   . COPD Sister   . Heart disease Sister        Pacemaker  . Lung cancer Sister   . Colon cancer Neg Hx        Home Medications:   Prior to Admission medications   Medication Sig Start Date End Date Taking? Authorizing Provider  albuterol (PROVENTIL) (2.5 MG/3ML) 0.083% nebulizer solution Take 3 mLs (2.5 mg total) by nebulization every 6 (six) hours as needed for wheezing or shortness of breath (dx J43.2). 05/24/19   Parrett, Fonnie Mu, NP  azithromycin (ZITHROMAX) 250 MG tablet TAKE 1 TABLET BY MOUTH EVERY DAY 04/26/19   Juanito Doom, MD  Calcium Carb-Cholecalciferol (CALCIUM-VITAMIN D) 500-200 MG-UNIT tablet Take 1 tablet by mouth daily. 08/23/18   Tonia Ghent, MD  cetirizine (ZYRTEC) 10 MG tablet Take 10 mg by mouth daily.    [provider]  ferrous sulfate 325 (65 FE) MG EC tablet Take 325 mg by mouth daily with breakfast.     [provider]  Fluticasone-Umeclidin-Vilant 100-62.5-25 MCG/INH AEPB Take 1 puff by mouth daily. 03/30/19   Parrett, Fonnie Mu, NP  guaiFENesin (ROBITUSSIN) 100 MG/5ML liquid Take 10 mLs (200 mg total) by mouth 3 (three) times daily as needed for cough. 08/23/18   Tonia Ghent, MD  levothyroxine (SYNTHROID) 100 MCG tablet PLACE 1 TABLET INTO FEEDING TUBE DAILY. 05/22/19   Tonia Ghent, MD  omeprazole (PRILOSEC) 40 MG capsule TAKE 1 CAPSULE BY MOUTH EVERY DAY 02/24/19   Tonia Ghent, MD  polyethylene glycol [facility] / Floria Raveling) packet Take 17 g by mouth daily as needed for mild constipation. 08/23/18   Tonia Ghent, MD  QUEtiapine (SEROQUEL) 25 MG tablet TAKE 1/2 TABLET BY MOUTH AT BEDTIME DAILY 06/27/19   Tonia Ghent, MD  Respiratory Therapy Supplies (FLUTTER) DEVI Use as directed 07/26/18   Juanito Doom, MD  torsemide (DEMADEX) 20 MG tablet TAKE 1 TABLET (20 MG TOTAL) BY MOUTH DAILY AS NEEDED (ANKLE SWELLING OR 2 POUNDS ABOVE DRY WEIGHT). 02/23/19   Juanito Doom, MD  verapamil (CALAN) 80 MG tablet PLACE 1 TABLET BY MOUTH TWICE DAILY 05/01/19   Tonia Ghent, MD     Allergies:     Allergies  Allergen Reactions  . Codeine Nausea And Vomiting     Physical Exam:   Vitals  Blood pressure (!) 142/64, pulse 76, temperature 97.6 F (36.4 C), temperature source Oral, resp. rate 16, SpO2 98 %.  1.  General: axoxo3  2. Psychiatric: euthymic  3. Neurologic: Nonfocal,  cn212 intact, reflexes 2+ symmetric, diffuse with no clonus, motor 5/5 in all 4 ext  4. HEENMT:  Anicteric, pupils 1.74mm symmetric, direct, consensual intact Neck: no jvd  5. Respiratory : Slight crackles in the right lung base/ mid lung, no wheezing  6. Cardiovascular : rrr s1, s2, no m/g/r  Pacer in the left upper chest  7. Gastrointestinal:  Abd: soft, nt, nd +bs  8. Skin:  Ext: no c/c/e,  No rash  9.Musculoskeletal:  Good ROM    Data Review:    CBC Recent Labs    Lab 07/06/19 1116  WBC 6.3  HGB 10.5*  HCT 33.2*  PLT 161  MCV 98.2  MCH 31.1  MCHC 31.6  RDW 13.2   ------------------------------------------------------------------------------------------------------------------  Results for orders placed or performed during the hospital encounter of 07/06/19 (from the past 48 hour(s))  CBC     Status: Abnormal   Collection Time: 07/06/19 11:16 AM  Result Value Ref Range   WBC 6.3 4.0 - 10.5 K/uL   RBC 3.38 (L) 4.22 - 5.81 MIL/uL   Hemoglobin 10.5 (L) 13.0 - 17.0 g/dL   HCT 33.2 (L) 39.0 - 52.0 %   MCV 98.2 80.0 - 100.0 fL   MCH 31.1 26.0 - 34.0 pg   MCHC 31.6 30.0 - 36.0 g/dL   RDW 13.2 11.5 - 15.5 %   Platelets 161 150 - 400 K/uL   nRBC 0.0 0.0 - 0.2 %    Comment: Performed at [facility], [place] [address], [place], [place] [id number]  Basic metabolic panel     Status: Abnormal   Collection Time: 07/06/19 11:16 AM  Result Value Ref Range   Sodium 138 135 - 145 mmol/L   Potassium 3.8 3.5 - 5.1 mmol/L   Chloride 106 98 - 111 mmol/L   CO2 23 22 - 32 mmol/L   Glucose, Bld 88 70 - 99 mg/dL   BUN 20 8 - 23 mg/dL   Creatinine, Ser 1.13 0.61 - 1.24 mg/dL   Calcium 8.4 (L) 8.9 - 10.3 mg/dL   GFR calc non Af Amer 56 (L) >60 mL/min   GFR calc Af Amer >60 >60 mL/min   Anion gap 9 5 - 15    Comment: Performed at [facility] [address] [place], [place] [address]  Hepatic function panel     Status: Abnormal   Collection Time: 07/06/19 11:55 AM  Result Value Ref Range   Total Protein 6.3 (L) 6.5 - 8.1 g/dL   Albumin 2.7 (L) 3.5 - 5.0 g/dL   AST 37 15 - 41 U/L   ALT 18 0 - 44 U/L   Alkaline Phosphatase 59 38 - 126 U/L   Total Bilirubin 0.5 0.3 - 1.2 mg/dL   Bilirubin, Direct 0.2 0.0 - 0.2 mg/dL   Indirect Bilirubin 0.3 0.3 - 0.9 mg/dL    Comment: Performed at [facility] [address], [place], [place] [address]  Urinalysis, Routine w reflex microscopic     Status: Abnormal   Collection Time: 07/06/19 10:01 PM   Result Value Ref Range   Color, Urine YELLOW YELLOW   APPearance CLEAR CLEAR   Specific Gravity, Urine 1.034 (H) 1.005 - 1.030   pH 5.0 5.0 - 8.0   Glucose, UA NEGATIVE NEGATIVE mg/dL   Hgb urine dipstick MODERATE (A) NEGATIVE   Bilirubin Urine NEGATIVE NEGATIVE   Ketones, ur 5 (A) NEGATIVE mg/dL   Protein, ur NEGATIVE NEGATIVE mg/dL   Nitrite NEGATIVE NEGATIVE   Leukocytes,Ua NEGATIVE NEGATIVE   RBC / HPF 0-5 0 - 5 RBC/hpf   WBC, UA 0-5 0 - 5 WBC/hpf   Bacteria, UA RARE (A) NONE SEEN   Mucus PRESENT     Comment: Performed at [facility] [address] [place],  [address]    Chemistries  Recent Labs  Lab 07/06/19 1116 07/06/19 1155  NA 138  --   K 3.8  --   CL 106  --   CO2 23  --   GLUCOSE 88  --   BUN 20  --   CREATININE 1.13  --  CALCIUM 8.4*  --   AST  --  37  ALT  --  18  ALKPHOS  --  59  BILITOT  --  0.5   ------------------------------------------------------------------------------------------------------------------  ------------------------------------------------------------------------------------------------------------------ GFR: CrCl cannot be calculated (Unknown ideal weight.). Liver Function Tests: Recent Labs  Lab 07/06/19 1155  AST 37  ALT 18  ALKPHOS 59  BILITOT 0.5  PROT 6.3*  ALBUMIN 2.7*   No results for input(s): LIPASE, AMYLASE in the last 168 hours. No results for input(s): AMMONIA in the last 168 hours. Coagulation Profile: No results for input(s): INR, PROTIME in the last 168 hours. Cardiac Enzymes: No results for input(s): CKTOTAL, CKMB, CKMBINDEX, TROPONINI in the last 168 hours. BNP (last 3 results) No results for input(s): PROBNP in the last 8760 hours. HbA1C: No results for input(s): HGBA1C in the last 72 hours. CBG: No results for input(s): GLUCAP in the last 168 hours. Lipid Profile: No results for input(s): CHOL, HDL, LDLCALC, TRIG, CHOLHDL, LDLDIRECT in the last 72 hours. Thyroid Function  Tests: No results for input(s): TSH, T4TOTAL, FREET4, T3FREE, THYROIDAB in the last 72 hours. Anemia Panel: No results for input(s): VITAMINB12, FOLATE, FERRITIN, TIBC, IRON, RETICCTPCT in the last 72 hours.  --------------------------------------------------------------------------------------------------------------- Urine analysis:    Component Value Date/Time   COLORURINE YELLOW 07/06/2019 2201   APPEARANCEUR CLEAR 07/06/2019 2201   LABSPEC 1.034 (H) 07/06/2019 2201   PHURINE 5.0 07/06/2019 2201   GLUCOSEU NEGATIVE 07/06/2019 2201   GLUCOSEU NEGATIVE 05/26/2018 1716   HGBUR MODERATE (A) 07/06/2019 2201   BILIRUBINUR NEGATIVE 07/06/2019 2201   KETONESUR 5 (A) 07/06/2019 2201   PROTEINUR NEGATIVE 07/06/2019 2201   UROBILINOGEN 0.2 05/26/2018 1716   NITRITE NEGATIVE 07/06/2019 2201   LEUKOCYTESUR NEGATIVE 07/06/2019 2201      Imaging Results:    CT Angio Chest PE W and/or Wo Contrast  Result Date: 07/06/2019 CLINICAL DATA:  Pulmonary emboli suspected in the right lower lobe at abdominal CT. EXAM: CT ANGIOGRAPHY CHEST WITH CONTRAST TECHNIQUE: Multidetector CT imaging of the chest was performed using the standard protocol during bolus administration of intravenous contrast. Multiplanar CT image reconstructions and MIPs were obtained to evaluate the vascular anatomy. CONTRAST:  142mL OMNIPAQUE IOHEXOL 350 MG/ML SOLN COMPARISON:  Abdominal CT same day FINDINGS: Cardiovascular: Pulmonary arterial opacification is good. There are no pulmonary emboli. Specific attention to the right lower lobe pulmonary arterial tree does not show embolic disease. Heart size is normal. The patient has a pericardial effusion. There is extensive coronary artery calcification. There is advanced atherosclerotic disease of the thoracic aorta. Mediastinum/Nodes: No paratracheal adenopathy. Mild nodal prominence in the Peri carinal region, possibly reactive. Lungs/Pleura: Upper lungs show emphysema and mild scarring.  There is mild atelectasis at the left lung base. The right mainstem bronchus is patent as is the right upper lobe bronchus. The bronchus intermedia is opacified. There is central volume loss in the right lower lobe. All of this may be inflammatory disease, but I cannot rule out neoplastic disease particularly in the right lower lobe. Follow-up will be necessary after treatment. Upper Abdomen: Negative Musculoskeletal: Ordinary spinal degenerative changes. Review of the MIP images confirms the above findings. IMPRESSION: No pulmonary emboli. Advanced aortic atherosclerosis. Coronary artery calcification. Pericardial effusion. Opacification of the bronchus intermedia on the right with subtotal collapse/infiltrate in the right lower lobe. Findings are most consistent with inflammatory disease. Cannot rule out possibility of tumor in this region. Follow-up after treatment will be necessary. Aortic Atherosclerosis (ICD10-I70.0) and Emphysema (ICD10-J43.9). Electronically Signed  By: Nelson Chimes M.D.   On: 07/06/2019 21:55   CT ABDOMEN PELVIS W CONTRAST  Result Date: 07/06/2019 CLINICAL DATA:  Constipation. EXAM: CT ABDOMEN AND PELVIS WITH CONTRAST TECHNIQUE: Multidetector CT imaging of the abdomen and pelvis was performed using the standard protocol following bolus administration of intravenous contrast. CONTRAST:  154mL OMNIPAQUE IOHEXOL 300 MG/ML  SOLN COMPARISON:  None. FINDINGS: Lower chest: Mild atelectasis and/or infiltrate is seen within the right lung base. There is a small pericardial effusion. Ill-defined filling defects are suspected within the posterior lower lobe branches of the right pulmonary artery. Hepatobiliary: No focal liver abnormality is seen. Status post cholecystectomy. No biliary dilatation. Pancreas: Unremarkable. No pancreatic ductal dilatation or surrounding inflammatory changes. Spleen: An 8 mm cystic appearing areas seen within an otherwise normal-appearing spleen. Adrenals/Urinary  Tract: The adrenal glands are slightly nodular in appearance. Kidneys are normal in size, without renal calculi or hydronephrosis. A 1.8 cm simple cyst is seen within the lower pole of the right kidney. Bladder is unremarkable. Stomach/Bowel: Stomach is within normal limits. The appendix is not identified. No evidence of bowel dilatation. A large amount of stool is seen throughout the colon. Noninflamed diverticula are seen throughout the sigmoid colon. Vascular/Lymphatic: Marked severity aortic atherosclerosis. No enlarged abdominal or pelvic lymph nodes. Reproductive: The prostate gland is moderately enlarged. Other: No abdominal wall hernia or abnormality. No abdominopelvic ascites. Musculoskeletal: The muscular sugar of the anteromedial aspect of the left thigh is heterogeneous in appearance and contains multiple small tortuous vessels (axial CT images 72 through 96, CT series number 2). There is a total right hip replacement with associated streak artifact and subsequently limited evaluation of the adjacent osseous and soft tissue structures. Multilevel degenerative changes seen throughout the lumbar spine with evidence of prior vertebroplasty at the level of the L2 vertebral body. IMPRESSION: 1. Ill-defined areas of low attenuation involving multiple posterior lower lobe branches of the right pulmonary artery. While this may represent areas of artifact, pulmonary embolism cannot be excluded. Correlation with chest CT a is recommended. 2. Mild right basilar atelectasis and/or infiltrate. 3. Evidence of prior cholecystectomy. 4. Large amount of stool throughout the large bowel. 5. Simple cyst within the right kidney. 6. Total right hip replacement. 7. Enlarged prostate gland. Electronically Signed   By: Virgina Norfolk M.D.   On: 07/06/2019 20:01       Assessment & Plan:    Principal Problem:   HCAP (healthcare-associated pneumonia) Active Problems:   COPD (chronic obstructive pulmonary disease) with  emphysema (HCC)   HTN (hypertension)   Hypothyroidism   Chronic diastolic heart failure (HCC)  Hcap/ Aspiration pneumonia Recent covid-19 positive 06/19/19 Blood culture x2 vanco iv, zosyn iv pharmacy to dose Speech therapy consult placed in computer  Chronic diastolic CHF Cont Torsemide 20mg  po qday Cont hydralazine 50mg  po tid  Copd Cont Trelegy Cont Albuterol HFA 2puff q6h prn   Hypothyroidism Cont Levothyroxine  Gerd Cont PPI  Anemia Cont Ferrous sulfate   Constipation Mineral oil enema.   DVT Prophylaxis-   Lovenox - SCDs   AM Labs Ordered, also please review Full Orders  Family Communication: Admission, patients condition and plan of care including tests being ordered have been discussed with the patient  who indicate understanding and agree with the plan and Code Status.  Code Status:  FULL CODE per patient, d/w daughter that patient admitted to [facility]  Admission status: Inpatient: Based on patients clinical presentation and evaluation of above clinical data, I have  made determination that patient meets Inpatient criteria at this time. Pt has hcap, will require iv abx,  Pt has high risk of clinical deterioration. Pt will require w/up  Of aspiration.  Pt will require >2 nites stay.   Time spent in minutes : 70    Jani Gravel M.D on 07/07/2019 at 12:25 AM

## 2019-07-07 NOTE — ED Notes (Signed)
Called PT to notify them that pt needs swallow eval.

## 2019-07-07 NOTE — ED Notes (Signed)
Spoke with daughter, [name], RE pt status.

## 2019-07-07 NOTE — Plan of Care (Signed)
[facility] Attempted to call for pulmonary consult for ? subtotal RLL Collapse but EICU is in a code situation , please call for pulmonary consult in AM, thanks

## 2019-07-07 NOTE — Evaluation (Signed)
Clinical/Bedside Swallow Evaluation Patient Details  Name: [name] Shuey Sr. MRN: [id number] Date of Birth: [birth year]  Today's Date: 07/07/2019 Time: SLP Start Time (ACUTE ONLY): [id number] SLP Stop Time (ACUTE ONLY): 1015 SLP Time Calculation (min) (ACUTE ONLY): 50 min  Past Medical History:  Past Medical History:  Diagnosis Date  . Anemia   . Bradycardia   . COPD (chronic obstructive pulmonary disease) (HCC)    Mild  . HTN (hypertension)   . Hypothyroidism    Past Surgical History:  Past Surgical History:  Procedure Laterality Date  . CATARACT EXTRACTION Bilateral   . GALLBLADDER SURGERY  02/21/2011  . OTHER SURGICAL HISTORY     finger right pointer--laceration repair  . PACEMAKER INSERTION     at [facility]   . TOTAL HIP ARTHROPLASTY     HPI:  86 year old male with a history of anemia, COPD, hypertension, hypothyroidism, sacral ulcer, chronic dysphagia, s/p pacer for bradycardia 2020, h/o COVID-19 1/4-15/21 who presents to the emergency department from home 07/06/19 for evaluation of decreased urine and stool output. CT chest in ED: Opacification of the bronchus intermedia on the right with subtotal collapse/infiltrate in the right lower lobe. Findings are most consistent with inflammatory disease. Cannot rule out possibility of tumor in this region."  Hx of chronic dysphagia that appears to wax and wane.  He has had MBS in [facility] system in Oct 2019.  His daughter, [name] [name], is knowledgable about his swallowing and reports that every time her father is hospitalized, he decompensates, swallow becomes acutely worse, then he slowly improves upon return home.  Had PEG 2019, removed early 2020. During last hospitalization at [facility] (D/Cd 1/15), it was recommended that he remain on a mechanical soft diet with nectar thick liquids.  FEES had been planned but family declined.     Assessment / Plan / Recommendation Clinical Impression  Pt presents with obvious s/s of a dysphagia with aspiration.  Oral  mechanism exam unremarkable; dentures not present per pt.  He demonstrated adequate oral anticipation/attention to food/liquid.  Applesauce was consumed without overt difficulty; consumption of teaspoons and cup sips of thin and nectar thick liquids led to consistent and immediate explosive, wet coughing, concerning for aspiration. [place] set up; pt able to self-suction.  Spoke with daughter, [name] [name], who reports decompensation of swallowing every time he is hospitalized.  When he returns home and routine stabilizes, he demonstrates improvement.  For now, recommend maintaining NPO except meds crushed in puree and ice chips after oral care.  SLP will follow for readiness to resume oral diet; [name] [name] agrees with plan.   SLP Visit Diagnosis: Dysphagia, oropharyngeal phase (R13.12)    Aspiration Risk  Moderate aspiration risk    Diet Recommendation   npo except meds crushed in puree, allow ice chips  Medication Administration: Crushed with puree    Other  Recommendations Oral Care Recommendations: Oral care prior to ice chip/H20   Follow up Recommendations (tba)      Frequency and Duration min 3x week          Prognosis        Swallow Study   General HPI: 86 year old male with a history of anemia, COPD, hypertension, hypothyroidism, sacral ulcer, chronic dysphagia, s/p pacer for bradycardia 2020, h/o COVID-19 1/4-15/21 who presents to the emergency department from home 07/06/19 for evaluation of decreased urine and stool output. CT chest in ED: Opacification of the bronchus intermedia on the right with subtotal collapse/infiltrate in the right lower lobe. Findings are most consistent with  inflammatory disease. Cannot rule out possibility of tumor in this region."  Hx of chronic dysphagia that appears to wax and wane.  He has had MBS in [facility] system in Oct 2019.  His daughter, [name] [name], is knowledgable about his swallowing and reports that every time her father is hospitalized, he decompensates,  swallow becomes acutely worse, then he slowly improves upon return home.  Had PEG 2019, removed early 2020. During last hospitalization at [facility] (D/Cd 1/15), it was recommended that he remain on a mechanical soft diet with nectar thick liquids.  FEES had been planned but family declined.   Type of Study: Bedside Swallow Evaluation Previous Swallow Assessment: see hpi Diet Prior to this Study: NPO Temperature Spikes Noted: No Respiratory Status: Room air History of Recent Intubation: No Behavior/Cognition: Alert;Cooperative Oral Cavity Assessment: Within Functional Limits Oral Care Completed by SLP: Recent completion by staff Oral Cavity - Dentition: Edentulous;Dentures, not available Vision: Functional for self-feeding Self-Feeding Abilities: Needs assist Patient Positioning: Upright in bed Baseline Vocal Quality: Normal Volitional Cough: Strong Volitional Swallow: Able to elicit    Oral/Motor/Sensory Function Overall Oral Motor/Sensory Function: Within functional limits   Ice Chips Ice chips: Within functional limits Presentation: Spoon   Thin Liquid Thin Liquid: Impaired Presentation: Cup;Spoon Pharyngeal  Phase Impairments: Wet Vocal Quality;Cough - Immediate    Nectar Thick Nectar Thick Liquid: Impaired Presentation: Spoon;Cup Pharyngeal Phase Impairments: Wet Vocal Quality;Cough - Immediate   Honey Thick Honey Thick Liquid: Not tested   Puree Puree: Within functional limits Presentation: Spoon   Solid     Solid: Not tested      Juan Quam Laurice 07/07/2019,10:30 AM   [name], [place] Office number [phone] Pager [phone]

## 2019-07-07 NOTE — ED Notes (Signed)
1730 - Updated daughter.  Pt spoke with daughter on phone.  Daughter notified she is welcome to sit with pt seeing as he is appearing more confused.

## 2019-07-08 LAB — GLUCOSE, CAPILLARY: Glucose-Capillary: 62 mg/dL — ABNORMAL LOW (ref 70–99)

## 2019-07-08 LAB — CBC
HCT: 26.8 % — ABNORMAL LOW (ref 39.0–52.0)
Hemoglobin: 8.9 g/dL — ABNORMAL LOW (ref 13.0–17.0)
MCH: 31.2 pg (ref 26.0–34.0)
MCHC: 33.2 g/dL (ref 30.0–36.0)
MCV: 94 fL (ref 80.0–100.0)
Platelets: 138 10*3/uL — ABNORMAL LOW (ref 150–400)
RBC: 2.85 MIL/uL — ABNORMAL LOW (ref 4.22–5.81)
RDW: 13.2 % (ref 11.5–15.5)
WBC: 9.1 10*3/uL (ref 4.0–10.5)
nRBC: 0 % (ref 0.0–0.2)

## 2019-07-08 LAB — BASIC METABOLIC PANEL
Anion gap: 15 (ref 5–15)
BUN: 20 mg/dL (ref 8–23)
CO2: 16 mmol/L — ABNORMAL LOW (ref 22–32)
Calcium: 7.8 mg/dL — ABNORMAL LOW (ref 8.9–10.3)
Chloride: 111 mmol/L (ref 98–111)
Creatinine, Ser: 1.34 mg/dL — ABNORMAL HIGH (ref 0.61–1.24)
GFR calc Af Amer: 53 mL/min — ABNORMAL LOW (ref >60)
GFR calc non Af Amer: 46 mL/min — ABNORMAL LOW (ref >60)
Glucose, Bld: 69 mg/dL — ABNORMAL LOW (ref 70–99)
Potassium: 3.8 mmol/L (ref 3.5–5.1)
Sodium: 142 mmol/L (ref 135–145)

## 2019-07-08 LAB — LEGIONELLA PNEUMOPHILA SEROGP 1 UR AG: L. pneumophila Serogp 1 Ur Ag: NEGATIVE

## 2019-07-08 LAB — URINE CULTURE: Culture: <10000 — AB

## 2019-07-08 MED ORDER — SODIUM CHLORIDE 3 % IN NEBU
4.0000 mL | INHALATION_SOLUTION | Freq: Two times a day (BID) | RESPIRATORY_TRACT | Status: DC
  Administered 2019-07-08 – 2019-07-11 (×4): 4 mL via RESPIRATORY_TRACT
  Filled 2019-07-08 (×7): qty 4

## 2019-07-08 MED ORDER — DEXTROSE 50 % IV SOLN
INTRAVENOUS | Status: AC
  Filled 2019-07-08: qty 50

## 2019-07-08 MED ORDER — SODIUM CHLORIDE 0.9 % IV SOLN: INTRAVENOUS | Status: DC

## 2019-07-08 MED ORDER — DEXTROSE 5 % IV SOLN: INTRAVENOUS | Status: AC

## 2019-07-08 MED ORDER — IPRATROPIUM-ALBUTEROL 0.5-2.5 (3) MG/3ML IN SOLN: 3.0000 mL | RESPIRATORY_TRACT | Status: DC | PRN

## 2019-07-08 NOTE — Progress Notes (Addendum)
PROGRESS NOTE    [name] Rivenburgh Sr.  [id number] DOB: 1925/01/25 DOA: 07/06/2019 PCP: Tonia Ghent, MD  Brief Narrative: 86 year old male with history of severe COPD, chronic diastolic CHF, chronic dysphagia, history of recurrent pneumonia was admitted to [facility] from 12/30-1/1 with severe bradycardia and second-degree AV block's, he had a pacemaker placed at that time, subsequently readmitted to [facility] from 1/3-1/15 with acute hypoxic respiratory failure he was diagnosed with Covid on 1/4 and treated for this with IV remdesivir and Decadron, he was also treated for community-acquired pneumonia. -He was discharged home a week ago from [facility] and presented to our emergency room with decreased urine output, constipation, weakness, per daughter they recommended Hospice. -In the emergency room labs were unremarkable, vital signs were stable, CT abdomen pelvis concerning for constipation, CT angiogram chest noted opacification of bronchus intermedius on the right with subtotal collapse/infiltrate in the right lower lobe. -SLP evaluation 1/22: Moderate to severe aspiration risk, n.p.o. recommended, meds in applesauce 1/23: more drowsy   Assessment & Plan:   Aspiration pneumonia RLL Collapse Dysphagia -Longstanding history of dysphagia for few years, likely worsened now -SLP following, n.p.o. recommended yesterday, they will follow and reassess -continue gentle IV fluids -Continue IV Unasyn -with regards to right lower lobe lung collapse patient is too frail for bronchoscopy, continue flutter valve and hypertonic saline nebs, d/w pulmonary Dr.McQuaid yesterday  Encephalopathy -more drowsy today, could be secondary to infection -check CT head -hold pm seroquel -UA yesterday was unremarkable  Constipation -for 1 week per daughter -CT with evidence of large amount of stool -continue laxatives, suppository -Enema if needed  Recent Covid -Positive for Covid on  1/4-hospitalized at [facility], treated with IV remdesivir and Decadron, will complete 21-day isolation on [id number]  Chronic diastolic CHF -Hold torsemide, continue hydralazine at lower dose  COPD -Stable, continue albuterol HFA as needed  Hypothyroidism -Continue Synthroid  Skin cancer/neck lesion -Follow-up with dermatology  DVT prophylaxis: Lovenox  Code Status: DNR, I discussed CODE STATUS with patient 1/22, he told me he would not want to be shocked or put on life support, called and discussed this with patient's daughter [name] [name] who agrees with DNR  Family Communication: No family at bedside, called and discussed with daughter 1/22, 1/23 Disposition Plan: Home pending improvement in encephalopathy, pneumonia, aspiration  Consultants:      Procedures:   Antimicrobials:    Subjective: -Drowsy this morning, arousable, somnolent overnight as well, did not get some of his p.m. medicines  Objective: Vitals:   07/07/19 2237 07/08/19 0518 07/08/19 0746 07/08/19 0750  BP:  (!) 118/51  (!) 116/53  Pulse:   60 (!) 59  Resp:   18 18  Temp:    98.9 F (37.2 C)  TempSrc:    Oral  SpO2: 96%  93% 99%  Weight:      Height:        Intake/Output Summary (Last 24 hours) at 07/08/2019 1118 Last data filed at 07/07/2019 1546 Gross per 24 hour  Intake 199.66 ml  Output --  Net 199.66 ml   Filed Weights   07/07/19 1100  Weight: 62.1 kg    Examination:  General exam: Elderly frail chronically ill male, mouth open, somnolent but arousable HEENT edentulous, skin cancer lesion on left side of his neck Lungs: Poor air movement bilaterally, few basilar rhonchi CVS, S1-S2, regular rate rhythm Abdomen is soft, nontender, nondistended, bowel sounds present Neuro: Somnolent, arousable, moves all extremities, no localizing signs Skin: No rashes, large  cancerous lesion on left side on neck Psychiatry: Unable to assess    Data Reviewed:   CBC: Recent Labs  Lab 07/06/19 1116  07/08/19 0442  WBC 6.3 9.1  HGB 10.5* 8.9*  HCT 33.2* 26.8*  MCV 98.2 94.0  PLT 161 0000000*   Basic Metabolic Panel: Recent Labs  Lab 07/06/19 1116 07/08/19 0442  NA 138 142  K 3.8 3.8  CL 106 111  CO2 23 16*  GLUCOSE 88 69*  BUN 20 20  CREATININE 1.13 1.34*  CALCIUM 8.4* 7.8*   GFR: Estimated Creatinine Clearance: 30.6 mL/min (A) (by C-G formula based on SCr of 1.34 mg/dL (H)). Liver Function Tests: Recent Labs  Lab 07/06/19 1155  AST 37  ALT 18  ALKPHOS 59  BILITOT 0.5  PROT 6.3*  ALBUMIN 2.7*   No results for input(s): LIPASE, AMYLASE in the last 168 hours. No results for input(s): AMMONIA in the last 168 hours. Coagulation Profile: No results for input(s): INR, PROTIME in the last 168 hours. Cardiac Enzymes: No results for input(s): CKTOTAL, CKMB, CKMBINDEX, TROPONINI in the last 168 hours. BNP (last 3 results) No results for input(s): PROBNP in the last 8760 hours. HbA1C: No results for input(s): HGBA1C in the last 72 hours. CBG: No results for input(s): GLUCAP in the last 168 hours. Lipid Profile: No results for input(s): CHOL, HDL, LDLCALC, TRIG, CHOLHDL, LDLDIRECT in the last 72 hours. Thyroid Function Tests: No results for input(s): TSH, T4TOTAL, FREET4, T3FREE, THYROIDAB in the last 72 hours. Anemia Panel: No results for input(s): VITAMINB12, FOLATE, FERRITIN, TIBC, IRON, RETICCTPCT in the last 72 hours. Urine analysis:    Component Value Date/Time   COLORURINE YELLOW 07/06/2019 2201   APPEARANCEUR CLEAR 07/06/2019 2201   LABSPEC 1.034 (H) 07/06/2019 2201   PHURINE 5.0 07/06/2019 2201   GLUCOSEU NEGATIVE 07/06/2019 2201   GLUCOSEU NEGATIVE 05/26/2018 1716   HGBUR MODERATE (A) 07/06/2019 2201   BILIRUBINUR NEGATIVE 07/06/2019 2201   KETONESUR 5 (A) 07/06/2019 2201   PROTEINUR NEGATIVE 07/06/2019 2201   UROBILINOGEN 0.2 05/26/2018 1716   NITRITE NEGATIVE 07/06/2019 2201   LEUKOCYTESUR NEGATIVE 07/06/2019 2201   Sepsis  Labs: @LABRCNTIP (procalcitonin:4,lacticidven:4)  ) Recent Results (from the past 240 hour(s))  SARS CORONAVIRUS 2 (TAT 6-24 HRS) Nasopharyngeal Nasopharyngeal Swab     Status: None   Collection Time: 07/06/19 11:26 PM   Specimen: Nasopharyngeal Swab  Result Value Ref Range Status   SARS Coronavirus 2 NEGATIVE NEGATIVE Final    Comment: (NOTE) SARS-CoV-2 target nucleic acids are NOT DETECTED. The SARS-CoV-2 RNA is generally detectable in upper and lower respiratory specimens during the acute phase of infection. Negative results do not preclude SARS-CoV-2 infection, do not rule out co-infections with other pathogens, and should not be used as the sole basis for treatment or other patient management decisions. Negative results must be combined with clinical observations, patient history, and epidemiological information. The expected result is Negative. Fact Sheet for Patients: SugarRoll.be Fact Sheet for Healthcare Providers: https://www.woods-mathews.com/ This test is not yet approved or cleared by the [place] FDA and  has been authorized for detection and/or diagnosis of SARS-CoV-2 by FDA under an Emergency Use Authorization (EUA). This EUA will remain  in effect (meaning this test can be used) for the duration of the COVID-19 declaration under Section 56 4(b)(1) of the Act, 21 U.S.C. section 360bbb-3(b)(1), unless the authorization is terminated or revoked sooner. Performed at [facility], [place] [address] [place], [place] [address]   Blood  culture (routine x 2)     Status: None (Preliminary result)   Collection Time: 07/07/19 12:05 AM   Specimen: BLOOD  Result Value Ref Range Status   Specimen Description BLOOD RIGHT ANTECUBITAL  Final   Special Requests   Final    BOTTLES DRAWN AEROBIC AND ANAEROBIC Blood Culture adequate volume   Culture   Final    NO GROWTH 1 DAY Performed at [facility], [address],  [place], [place] [address]    Report Status PENDING  Incomplete  Blood culture (routine x 2)     Status: None (Preliminary result)   Collection Time: 07/07/19 12:10 AM   Specimen: BLOOD  Result Value Ref Range Status   Specimen Description BLOOD LEFT ANTECUBITAL  Final   Special Requests   Final    BOTTLES DRAWN AEROBIC AND ANAEROBIC Blood Culture results may not be optimal due to an inadequate volume of blood received in culture bottles   Culture  Setup Time   Final    AEROBIC BOTTLE ONLY GRAM POSITIVE COCCI CRITICAL RESULT CALLED TO, READ BACK BY AND VERIFIED WITH: V BRYK PHARMD 07/08/19 0456 JDW    Culture   Final    NO GROWTH 1 DAY Performed at [facility], [place] [address] [place], [place] [address]    Report Status PENDING  Incomplete         Radiology Studies: CT Angio Chest PE W and/or Wo Contrast  Result Date: 07/06/2019 CLINICAL DATA:  Pulmonary emboli suspected in the right lower lobe at abdominal CT. EXAM: CT ANGIOGRAPHY CHEST WITH CONTRAST TECHNIQUE: Multidetector CT imaging of the chest was performed using the standard protocol during bolus administration of intravenous contrast. Multiplanar CT image reconstructions and MIPs were obtained to evaluate the vascular anatomy. CONTRAST:  161mL OMNIPAQUE IOHEXOL 350 MG/ML SOLN COMPARISON:  Abdominal CT same day FINDINGS: Cardiovascular: Pulmonary arterial opacification is good. There are no pulmonary emboli. Specific attention to the right lower lobe pulmonary arterial tree does not show embolic disease. Heart size is normal. The patient has a pericardial effusion. There is extensive coronary artery calcification. There is advanced atherosclerotic disease of the thoracic aorta. Mediastinum/Nodes: No paratracheal adenopathy. Mild nodal prominence in the Peri carinal region, possibly reactive. Lungs/Pleura: Upper lungs show emphysema and mild scarring. There is mild atelectasis at the left lung base. The right mainstem bronchus is  patent as is the right upper lobe bronchus. The bronchus intermedia is opacified. There is central volume loss in the right lower lobe. All of this may be inflammatory disease, but I cannot rule out neoplastic disease particularly in the right lower lobe. Follow-up will be necessary after treatment. Upper Abdomen: Negative Musculoskeletal: Ordinary spinal degenerative changes. Review of the MIP images confirms the above findings. IMPRESSION: No pulmonary emboli. Advanced aortic atherosclerosis. Coronary artery calcification. Pericardial effusion. Opacification of the bronchus intermedia on the right with subtotal collapse/infiltrate in the right lower lobe. Findings are most consistent with inflammatory disease. Cannot rule out possibility of tumor in this region. Follow-up after treatment will be necessary. Aortic Atherosclerosis (ICD10-I70.0) and Emphysema (ICD10-J43.9). Electronically Signed   By: Nelson Chimes M.D.   On: 07/06/2019 21:55   CT ABDOMEN PELVIS W CONTRAST  Result Date: 07/06/2019 CLINICAL DATA:  Constipation. EXAM: CT ABDOMEN AND PELVIS WITH CONTRAST TECHNIQUE: Multidetector CT imaging of the abdomen and pelvis was performed using the standard protocol following bolus administration of intravenous contrast. CONTRAST:  123mL OMNIPAQUE IOHEXOL 300 MG/ML  SOLN COMPARISON:  None. FINDINGS: Lower chest: Mild atelectasis and/or infiltrate is seen within the right lung base. There is a small pericardial effusion. Ill-defined filling defects are suspected within the posterior lower lobe branches of the right pulmonary artery. Hepatobiliary: No focal liver abnormality is seen. Status post cholecystectomy. No biliary dilatation. Pancreas: Unremarkable. No pancreatic ductal dilatation or surrounding inflammatory changes. Spleen: An 8 mm cystic appearing areas seen within an otherwise normal-appearing spleen. Adrenals/Urinary Tract: The adrenal glands are slightly nodular in appearance. Kidneys are normal in  size, without renal calculi or hydronephrosis. A 1.8 cm simple cyst is seen within the lower pole of the right kidney. Bladder is unremarkable. Stomach/Bowel: Stomach is within normal limits. The appendix is not identified. No evidence of bowel dilatation. A large amount of stool is seen throughout the colon. Noninflamed diverticula are seen throughout the sigmoid colon. Vascular/Lymphatic: Marked severity aortic atherosclerosis. No enlarged abdominal or pelvic lymph nodes. Reproductive: The prostate gland is moderately enlarged. Other: No abdominal wall hernia or abnormality. No abdominopelvic ascites. Musculoskeletal: The muscular sugar of the anteromedial aspect of the left thigh is heterogeneous in appearance and contains multiple small tortuous vessels (axial CT images 72 through 96, CT series number 2). There is a total right hip replacement with associated streak artifact and subsequently limited evaluation of the adjacent osseous and soft tissue structures. Multilevel degenerative changes seen throughout the lumbar spine with evidence of prior vertebroplasty at the level of the L2 vertebral body. IMPRESSION: 1. Ill-defined areas of low attenuation involving multiple posterior lower lobe branches of the right pulmonary artery. While this may represent areas of artifact, pulmonary embolism cannot be excluded. Correlation with chest CT a is recommended. 2. Mild right basilar atelectasis and/or infiltrate. 3. Evidence of prior cholecystectomy. 4. Large amount of stool throughout the large bowel. 5. Simple cyst within the right kidney. 6. Total right hip replacement. 7. Enlarged prostate gland. Electronically Signed   By: Virgina Norfolk M.D.   On: 07/06/2019 20:01        Scheduled Meds: . aspirin  81 mg Oral Daily  . hydrALAZINE  25 mg Oral Q8H  . levothyroxine  100 mcg Oral Q0600  . pantoprazole  40 mg Oral Q1200  . polyethylene glycol  17 g Oral Daily  . QUEtiapine  12.5 mg Oral QHS  .  senna-docusate  1 tablet Oral BID  . sodium chloride HYPERTONIC  4 mL Nebulization BID   Continuous Infusions: . sodium chloride 75 mL/hr at 07/08/19 0708  . ampicillin-sulbactam (UNASYN) IV 3 g (07/08/19 0313)     LOS: 2 days   Time spent: 54min  Domenic Polite, MD [facility]  07/08/2019, 11:18 AM

## 2019-07-08 NOTE — Progress Notes (Signed)
PHARMACY - PHYSICIAN COMMUNICATION CRITICAL VALUE ALERT - BLOOD CULTURE IDENTIFICATION (BCID)  [name] Minerd Sr. is an 86 y.o. male who presented to [facility] on 07/06/2019 with a chief complaint of cough (recent Covid infection requiring hospitalization at another hospital system, nearly complete with 21d isolation).  Assessment:  Started on broad-spectrum ABX for presumed sepsis, no leukocytosis >> changed to Unasyn for aspiration PNA given dysphagia, now w/ 1/4 bottles growing GPC, likely contaminant.  Name of physician (or Provider) Contacted: [facility]  Current antibiotics: Unasyn  Changes to prescribed antibiotics recommended:  No changes needed, await ID.   Wynona Neat, PharmD, BCPS  07/08/2019  5:01 AM

## 2019-07-08 NOTE — Progress Notes (Signed)
SLP Cancellation Note  Patient Details Name: Siddhan Stueck Sr. MRN: [id number] DOB: May 03, 1925   Cancelled treatment:       Reason Eval/Treat Not Completed: Fatigue/lethargy limiting ability to participate; "not coherent" per nursing; only responding to pain; ST will continue efforts as able.   Elvina Sidle, M.S., CCC-SLP 07/08/2019, 1:40 PM

## 2019-07-08 NOTE — Progress Notes (Signed)
Synapse-Pt has been minimally responsive most of the day with exception of latter shift. Pt has significant site to the left neck that started bleeding. Pressure was held <3 min and foam dressing placed. Per MD pt not out of COVID period and was restarted on airborne/contact precautions. Family was made aware and daughter had additional questions, was referred to MD which this nurse requested. Granddaughter later called very verbally upset and aggressive due to stated "feels like they're getting mixed messages". Granddaughter was recommended to Aunt for conversation between [place] and doctor as well as offered to page the doctor. Granddaughter refused both and stated they wanted to talk to Doctor, hospital. Offered charge nurse-granddaughter accepted offer.

## 2019-07-08 NOTE — Progress Notes (Signed)
Pt asked if they were having any pain-pt responded no, which was the first verbal response received from pt today. Pt then asked if he could squeeze hands, pt shook head no and immediately when back to sleep.

## 2019-07-08 NOTE — Progress Notes (Signed)
Request was made for MD to call daughter d/t daughter having questions r/t patient status. Daughter was made aware by this nurse of updated precaution status and protocol for visitor safety r/t precautions/COVID/PPE. Daughter stated yes she would appreciate a call from MD.

## 2019-07-09 DIAGNOSIS — I10 Essential (primary) hypertension: Secondary | ICD-10-CM

## 2019-07-09 DIAGNOSIS — T17908D Unspecified foreign body in respiratory tract, part unspecified causing other injury, subsequent encounter: Secondary | ICD-10-CM

## 2019-07-09 DIAGNOSIS — J439 Emphysema, unspecified: Secondary | ICD-10-CM

## 2019-07-09 LAB — COMPREHENSIVE METABOLIC PANEL
ALT: 15 U/L (ref 0–44)
AST: 26 U/L (ref 15–41)
Albumin: 2 g/dL — ABNORMAL LOW (ref 3.5–5.0)
Alkaline Phosphatase: 48 U/L (ref 38–126)
Anion gap: 10 (ref 5–15)
BUN: 14 mg/dL (ref 8–23)
CO2: 19 mmol/L — ABNORMAL LOW (ref 22–32)
Calcium: 7.8 mg/dL — ABNORMAL LOW (ref 8.9–10.3)
Chloride: 118 mmol/L — ABNORMAL HIGH (ref 98–111)
Creatinine, Ser: 1.23 mg/dL (ref 0.61–1.24)
GFR calc Af Amer: 59 mL/min — ABNORMAL LOW (ref >60)
GFR calc non Af Amer: 51 mL/min — ABNORMAL LOW (ref >60)
Glucose, Bld: 84 mg/dL (ref 70–99)
Potassium: 3.3 mmol/L — ABNORMAL LOW (ref 3.5–5.1)
Sodium: 147 mmol/L — ABNORMAL HIGH (ref 135–145)
Total Bilirubin: 0.6 mg/dL (ref 0.3–1.2)
Total Protein: 5.1 g/dL — ABNORMAL LOW (ref 6.5–8.1)

## 2019-07-09 LAB — CBC
HCT: 25.7 % — ABNORMAL LOW (ref 39.0–52.0)
Hemoglobin: 8.4 g/dL — ABNORMAL LOW (ref 13.0–17.0)
MCH: 30.9 pg (ref 26.0–34.0)
MCHC: 32.7 g/dL (ref 30.0–36.0)
MCV: 94.5 fL (ref 80.0–100.0)
Platelets: 134 10*3/uL — ABNORMAL LOW (ref 150–400)
RBC: 2.72 MIL/uL — ABNORMAL LOW (ref 4.22–5.81)
RDW: 13.5 % (ref 11.5–15.5)
WBC: 5.9 10*3/uL (ref 4.0–10.5)
nRBC: 0 % (ref 0.0–0.2)

## 2019-07-09 LAB — OCCULT BLOOD X 1 CARD TO LAB, STOOL: Fecal Occult Bld: NEGATIVE

## 2019-07-09 LAB — HEMOGLOBIN AND HEMATOCRIT, BLOOD
HCT: 29.2 % — ABNORMAL LOW (ref 39.0–52.0)
Hemoglobin: 9.7 g/dL — ABNORMAL LOW (ref 13.0–17.0)

## 2019-07-09 MED ORDER — POTASSIUM CHLORIDE 10 MEQ/100ML IV SOLN
10.0000 meq | INTRAVENOUS | Status: AC
  Administered 2019-07-09 (×2): 10 meq via INTRAVENOUS
  Filled 2019-07-09 (×2): qty 100

## 2019-07-09 MED ORDER — RESOURCE THICKENUP CLEAR PO POWD
Freq: Once | ORAL | Status: AC
  Filled 2019-07-09: qty 125

## 2019-07-09 NOTE — Progress Notes (Signed)
We found patient in bed bleeding and had thick blood clots around left chest which soiled clothes. The blood was coming from a wound located above left angle of mandible and bleeds with cough. The patient left fingers was bloody. Wound was cleaned and dressed up again.

## 2019-07-09 NOTE — Progress Notes (Signed)
Marland Kitchen  PROGRESS NOTE    [name] Klare Sr.  [id number] DOB: 04/28/1925 DOA: 07/06/2019 PCP: Tonia Ghent, MD   Brief Narrative:   86 year old male with history of severe COPD, chronic diastolic CHF, chronic dysphagia, history of recurrent pneumonia was admitted to [facility] from 12/30-1/1 with severe bradycardia and second-degree AV block's, he had a pacemaker placed at that time, subsequently readmitted to [facility] from 1/3-1/15 with acute hypoxic respiratory failure he was diagnosed with Covid on 1/4 and treated for this with IV remdesivir and Decadron, he was also treated for community-acquired pneumonia. - He was discharged home a week ago from [facility] and presented to our emergency room with decreased urine output, constipation, weakness, per daughter they recommended Hospice. - In the emergency room labs were unremarkable, vital signs were stable, CT abdomen pelvis concerning for constipation, CT angiogram chest noted opacification of bronchus intermedius on the right with subtotal collapse/infiltrate in the right lower lobe.  07/09/19: Picked at scab last night and had some bleeding. Family reports Fe deficinecy. Check labs. SLP worked with today. Dys 1 diet and thick liquids. Appreciate assistance. Add K+    Assessment & Plan:   Principal Problem:   HCAP (healthcare-associated pneumonia) Active Problems:   COPD (chronic obstructive pulmonary disease) with emphysema (HCC)   HTN (hypertension)   Pneumonia   Hypothyroidism   Chronic diastolic heart failure (HCC)  Aspiration pneumonia RLL Collapse Dysphagia     - Longstanding history of dysphagia for few years, likely worsened now     - SLP following, n.p.o. recommended yesterday, they will follow and reassess     - continue gentle IV fluids     - Continue IV Unasyn     - with regards to right lower lobe lung collapse patient is too frail for bronchoscopy, continue flutter valve and hypertonic saline nebs, d/w  pulmonary Dr.McQuaid yesterday     - 07/09/19: SLP recs dys 1 diet  Encephalopathy     - more drowsy today, could be secondary to infection     - hold pm seroquel     - UA yesterday was unremarkable     - 07/09/19: interactive today  Constipation     - for 1 week per daughter     - CT with evidence of large amount of stool     - continue laxatives, suppository     - Enema if needed  Recent Covid     - Positive for Covid on 1/4-hospitalized at [facility] treated with IV remdesivir and Decadron, will complete 21-day isolation on 1/25     - 07/09/19: Testing is negative  Chronic diastolic CHF     - Hold torsemide, continue hydralazine at lower dose     - 07/09/19: Scr is improving; monitor  COPD     - stable, continue albuterol HFA as needed  Hypothyroidism     - continue Synthroid  Hyperkalemia     - add K+  Skin cancer/neck lesion     - Follow-up with dermatology  Normocytic anemia     - had some bleeding from a scab he picked last night     - has history of Fe deficiency     - check iron level; monitor  Debility     - PT recs HHPT  DVT prophylaxis: SCDs Code Status: DNR Family Communication: With daughter on phone   Disposition Plan: TBD  Antimicrobials:  . Unasyn   ROS:  Denies CP, N, V . Remainder  10-pt ROS is negative for all not previously mentioned.  Subjective: "Ok"  Objective: Vitals:   07/09/19 0021 07/09/19 0024 07/09/19 0410 07/09/19 0745  BP: 124/60  121/70 (!) 141/68  Pulse: (!) 59 62 61 75  Resp: 19  19   Temp: 98.5 F (36.9 C)  98.6 F (37 C) 98.3 F (36.8 C)  TempSrc: Oral  Oral Oral  SpO2: 100%  96% 95%  Weight:      Height:        Intake/Output Summary (Last 24 hours) at 07/09/2019 1500 Last data filed at 07/09/2019 0600 Gross per 24 hour  Intake 815.03 ml  Output 428 ml  Net 387.03 ml   Filed Weights   07/07/19 1100  Weight: 62.1 kg    Examination:  General: 86 y.o. male resting in bed in NAD Cardiovascular: RRR,  +S1, S2, no m/g/r Respiratory: b/l rhonchi w/ some coursness, shallow breathing GI: BS+, NDNT, no masses noted, no organomegaly noted MSK: No e/c/c Neuro: Alert and following commands   Data Reviewed: I have personally reviewed following labs and imaging studies.  CBC: Recent Labs  Lab 07/06/19 1116 07/08/19 0442 07/09/19 0458  WBC 6.3 9.1 5.9  HGB 10.5* 8.9* 8.4*  HCT 33.2* 26.8* 25.7*  MCV 98.2 94.0 94.5  PLT 161 138* [id number]*   Basic Metabolic Panel: Recent Labs  Lab 07/06/19 1116 07/08/19 0442 07/09/19 0458  NA 138 142 147*  K 3.8 3.8 3.3*  CL 106 111 118*  CO2 23 16* 19*  GLUCOSE 88 69* 84  BUN 20 20 14   CREATININE 1.13 1.34* 1.23  CALCIUM 8.4* 7.8* 7.8*   GFR: Estimated Creatinine Clearance: 33.3 mL/min (by C-G formula based on SCr of 1.23 mg/dL). Liver Function Tests: Recent Labs  Lab 07/06/19 1155 07/09/19 0458  AST 37 26  ALT 18 15  ALKPHOS 59 48  BILITOT 0.5 0.6  PROT 6.3* 5.1*  ALBUMIN 2.7* 2.0*   No results for input(s): LIPASE, AMYLASE in the last 168 hours. No results for input(s): AMMONIA in the last 168 hours. Coagulation Profile: No results for input(s): INR, PROTIME in the last 168 hours. Cardiac Enzymes: No results for input(s): CKTOTAL, CKMB, CKMBINDEX, TROPONINI in the last 168 hours. BNP (last 3 results) No results for input(s): PROBNP in the last 8760 hours. HbA1C: No results for input(s): HGBA1C in the last 72 hours. CBG: Recent Labs  Lab 07/08/19 2028  GLUCAP 62*   Lipid Profile: No results for input(s): CHOL, HDL, LDLCALC, TRIG, CHOLHDL, LDLDIRECT in the last 72 hours. Thyroid Function Tests: No results for input(s): TSH, T4TOTAL, FREET4, T3FREE, THYROIDAB in the last 72 hours. Anemia Panel: No results for input(s): VITAMINB12, FOLATE, FERRITIN, TIBC, IRON, RETICCTPCT in the last 72 hours. Sepsis Labs: No results for input(s): PROCALCITON, LATICACIDVEN in the last 168 hours.  Recent Results (from the past 240 hour(s))    Urine culture     Status: Abnormal   Collection Time: 07/06/19  9:56 PM   Specimen: Urine, Catheterized  Result Value Ref Range Status   Specimen Description URINE, CATHETERIZED  Final   Special Requests NONE  Final   Culture (A)  Final    <10,000 COLONIES/mL INSIGNIFICANT GROWTH Performed at [facility], [address], [place], [place] [address]    Report Status 07/08/2019 FINAL  Final  SARS CORONAVIRUS 2 (TAT 6-24 HRS) Nasopharyngeal Nasopharyngeal Swab     Status: None   Collection Time: 07/06/19 11:26 PM   Specimen: Nasopharyngeal Swab  Result Value Ref Range Status   SARS Coronavirus 2 NEGATIVE NEGATIVE Final    Comment: (NOTE) SARS-CoV-2 target nucleic acids are NOT DETECTED. The SARS-CoV-2 RNA is generally detectable in upper and lower respiratory specimens during the acute phase of infection. Negative results do not preclude SARS-CoV-2 infection, do not rule out co-infections with other pathogens, and should not be used as the sole basis for treatment or other patient management decisions. Negative results must be combined with clinical observations, patient history, and epidemiological information. The expected result is Negative. Fact Sheet for Patients: SugarRoll.be Fact Sheet for Healthcare Providers: https://www.woods-mathews.com/ This test is not yet approved or cleared by the [place] FDA and  has been authorized for detection and/or diagnosis of SARS-CoV-2 by FDA under an Emergency Use Authorization (EUA). This EUA will remain  in effect (meaning this test can be used) for the duration of the COVID-19 declaration under Section 56 4(b)(1) of the Act, 21 U.S.C. section 360bbb-3(b)(1), unless the authorization is terminated or revoked sooner. Performed at [facility], [place] [address], [place], [place] [address]   Blood culture (routine x 2)     Status: None (Preliminary result)   Collection Time:  07/07/19 12:05 AM   Specimen: BLOOD  Result Value Ref Range Status   Specimen Description BLOOD RIGHT ANTECUBITAL  Final   Special Requests   Final    BOTTLES DRAWN AEROBIC AND ANAEROBIC Blood Culture adequate volume   Culture   Final    NO GROWTH 2 DAYS Performed at [facility], [place] [address], [place], [place] [address]    Report Status PENDING  Incomplete  Blood culture (routine x 2)     Status: Abnormal (Preliminary result)   Collection Time: 07/07/19 12:10 AM   Specimen: BLOOD  Result Value Ref Range Status   Specimen Description BLOOD LEFT ANTECUBITAL  Final   Special Requests   Final    BOTTLES DRAWN AEROBIC AND ANAEROBIC Blood Culture results may not be optimal due to an inadequate volume of blood received in culture bottles   Culture  Setup Time   Final    AEROBIC BOTTLE ONLY GRAM POSITIVE COCCI CRITICAL RESULT CALLED TO, READ BACK BY AND VERIFIED WITHAlona Bene [facility] 07/08/19 0456 JDW Performed at [facility], [place] [address], [place], [place] [address]    Culture STAPHYLOCOCCUS SPECIES (COAGULASE NEGATIVE) (A)  Final   Report Status PENDING  Incomplete      Radiology Studies: No results found.   Scheduled Meds: . aspirin  81 mg Oral Daily  . levothyroxine  100 mcg Oral Q0600  . pantoprazole  40 mg Oral Q1200  . polyethylene glycol  17 g Oral Daily  . Resource ThickenUp Clear   Oral Once  . senna-docusate  1 tablet Oral BID  . sodium chloride HYPERTONIC  4 mL Nebulization BID   Continuous Infusions: . ampicillin-sulbactam (UNASYN) IV 3 g (07/09/19 1118)     LOS: 3 days    Time spent: 25 minutes spent in the coordination of care today.    Jonnie Finner, DO [facility]  If 7PM-7AM, please contact night-coverage [email or web address] 07/09/2019, 3:00 PM

## 2019-07-09 NOTE — Progress Notes (Signed)
Daughter called this am and updated on patient status including diet and documented bowel status. Daughter was concerned about patient's hgb and lab work and asked if doctor's were reviewing patient's lab work. Daughter informed that the doctor's review labwork. Daughter had additional questions she also wanted information on. Daughter asked if this nurse could have the doctor call them and expressed her desire for daily calls. Informed daughter that I could put a request in for the doctor to give them a call.  Discussed with daughter that this nurse understands that the granddaughter also likes status updates and questions and recommended that daughter choose a time that both of them could be online and available for the doctor. Daughter agreeable and states anytime.

## 2019-07-09 NOTE — Progress Notes (Signed)
After shift change, pt was found actively bleeding from round wound site to left side of neck, approximately 1 cm in diameter. Blood clots were found from the wound site to the pt's left chest. Pt's left fingers were covered in dried and fresh blood. Pt was observed to pick and pull at PIV coverings, wound site, and PIV's. Mittens now in place for pt.

## 2019-07-09 NOTE — Evaluation (Signed)
Physical Therapy Evaluation Patient Details Name: [name] Grauel Sr. MRN: [id number] DOB: 1924-12-20 Today's Date: 07/09/2019   History of Present Illness  [name] [name]  is a 86 y.o. male, w Hypothyroidism, Chronic dysphagia, Anemia, CHF, Hypertension, s/p pacer for bradycardia 2020, Copd, left neck lesion/skin cancer,  h/o covid-19  w RLL pneumonia, 06/18/19-06/30/19 admitted [facility] (isolation ends 1/25), presents for lack of urination and constipation but notes cough w green sputum. CT chest subtotal collapse/infiltrate RLL; encephalopathy; 07/08/19 found to have bleeding and clots from left neck (with bleeding on his left fingers)   Clinical Impression   Pt admitted with above diagnosis. Spoke with patient's daughter as she cares for patient at home. She reports he has "delirium" every time he is hospitalized and experiences a decline in functional mobility. In December, prior to hospitalization he was transferring with min assist and walking household distances with RW and minguard assist. Upon HHPT evaluation 1/18, he transferred to wheelchair with 1 person assist and able to work on sit to stand at the sink. She reports he had developed rt knee tightness during last hospitalization. Pt currently with functional limitations due to the deficits listed below (see PT Problem List). Pt will benefit from skilled PT to increase their independence and safety with mobility to allow discharge to the venue listed below.       Follow Up Recommendations Home health PT;Supervision/Assistance - 24 hour(as family provided PTA)    Equipment Recommendations  None recommended by PT    Recommendations for Other Services OT consult     Precautions / Restrictions Precautions Precautions: Fall      Mobility  Bed Mobility Overal bed mobility: Needs Assistance Bed Mobility: Rolling Rolling: Total assist;+2 for physical assistance         General bed mobility comments: pt unable to follow  commands to assist; assisted by RN rolled rt/left and assisted to [place] (+2 total)  Transfers                    Ambulation/Gait                Stairs            Wheelchair Mobility    Modified Rankin (Stroke Patients Only)       Balance                                             Pertinent Vitals/Pain Pain Assessment: Faces Faces Pain Scale: No hurt    Home Living Family/patient expects to be discharged to:: Private residence Living Arrangements: Children(daughter) Available Help at Discharge: Family;Available 24 hours/day Type of Home: House       Home Layout: One level Home Equipment: Tub bench;Walker - 2 wheels;Bedside commode;Hospital bed;Wheelchair - Education administrator (comment)(lift)      Prior Function Level of Independence: Needs assistance   Gait / Transfers Assistance Needed: prior to December hospitalization, walking with minguard assist and RW; transfers min assist; after most recent discharge (1/15) pt had one HHPT session, pivoted to w/c one person, initiated standing at sink (could clear buttocks but not fully stand)  ADL's / Homemaking Assistance Needed: prior to December hospitalization: assisting with bathing/dressing (using tub bench); fed himself; after recent return home, dependent with bathing/dressing but able to feed himself with close supervision (due to dysphagia)        Hand Dominance  Extremity/Trunk Assessment   Upper Extremity Assessment Upper Extremity Assessment: Defer to OT evaluation    Lower Extremity Assessment Lower Extremity Assessment: RLE deficits/detail;LLE deficits/detail RLE Deficits / Details: holds rt knee in ~95 degrees flexion, no grimacing with atttempts to extend;  LLE Deficits / Details: PROM WFL; not following commands    Cervical / Trunk Assessment Cervical / Trunk Assessment: Other exceptions Cervical / Trunk Exceptions: rigid torso with rolling  Communication    Communication: Receptive difficulties;Expressive difficulties(dtr denies HOH; mumbling)  Cognition Arousal/Alertness: Awake/alert Behavior During Therapy: Flat affect Overall Cognitive Status: Impaired/Different from baseline                                 General Comments: not following commands; can state name; other attempts to communicate mumbled      General Comments      Exercises     Assessment/Plan    PT Assessment Patient needs continued PT services  PT Problem List Decreased strength;Decreased range of motion;Decreased activity tolerance;Decreased balance;Decreased mobility;Decreased cognition;Decreased knowledge of use of DME;Decreased safety awareness;Impaired tone       PT Treatment Interventions Gait training;Functional mobility training;Therapeutic activities;Therapeutic exercise;Balance training;Neuromuscular re-education;Cognitive remediation;Patient/family education;Wheelchair mobility training    PT Goals (Current goals can be found in the Care Plan section)  Acute Rehab PT Goals Patient Stated Goal: unable to state PT Goal Formulation: With family Time For Goal Achievement: 07/23/19 Potential to Achieve Goals: Fair    Frequency Min 3X/week   Barriers to discharge        Co-evaluation               AM-PAC PT "6 Clicks" Mobility  Outcome Measure Help needed turning from your back to your side while in a flat bed without using bedrails?: Total Help needed moving from lying on your back to sitting on the side of a flat bed without using bedrails?: Total Help needed moving to and from a bed to a chair (including a wheelchair)?: Total Help needed standing up from a chair using your arms (e.g., wheelchair or bedside chair)?: Total Help needed to walk in hospital room?: Total Help needed climbing 3-5 steps with a railing? : Total 6 Click Score: 6    End of Session   Activity Tolerance: Treatment limited secondary to medical  complications (Comment) Patient left: in bed;with call bell/phone within reach;with bed alarm set;with nursing/sitter in room Nurse Communication: Mobility status;Other (comment)(plan to call daughter for prior functional status) PT Visit Diagnosis: Muscle weakness (generalized) (M62.81);Difficulty in walking, not elsewhere classified (R26.2)    Time: [id number] PT Time Calculation (min) (ACUTE ONLY): 23 min   Charges:   PT Evaluation $PT Eval High Complexity: 1 High           Arby Barrette, PT Pager [phone]   Rexanne Mano 07/09/2019, 2:54 PM

## 2019-07-09 NOTE — Progress Notes (Signed)
  Speech Language Pathology Treatment: Dysphagia  Patient Details Name: [name] Goelz Sr. MRN: [id number] DOB: [birth year] Today's Date: 07/09/2019 Time: 1000-1025 SLP Time Calculation (min) (ACUTE ONLY): 25 min  Assessment / Plan / Recommendation Clinical Impression  Pt presents with some clinical improvement in swallowing today.  He is alert, remains confused, but consumed a container of pudding with no obvious difficulty until he reached the last few bites (wet, congested coughing).  Thin water/ice chips continue to elicit s/s of aspiration, consistent with function PTA according to pt's daughter.  Honey thick liquids were accepted with adequate oral attention, palpable swallow response, no s/s of aspiration.  Recommend beginning a dysphagia 1, honey-thick liquid diet cautiously.  Crush meds.  Pt will require careful hand-feeding at this time; hold tray if there are overt s/s of aspiration. SLP will follow for safety/diet progression, family education as needed. D/W RN and Dr. Marylyn [name].    HPI HPI: 86 year old male with a history of anemia, COPD, hypertension, hypothyroidism, sacral ulcer, chronic dysphagia, s/p pacer for bradycardia 2020, h/o COVID-19 1/4-15/21 who presents to the emergency department from home 07/06/19 for evaluation of decreased urine and stool output. CT chest in ED: Opacification of the bronchus intermedia on the right with subtotal collapse/infiltrate in the right lower lobe. Findings are most consistent with inflammatory disease. Cannot rule out possibility of tumor in this region."  Hx of chronic dysphagia that appears to wax and wane.  He has had MBS in [facility] system in Oct 2019.  His daughter, [name] [name], is knowledgable about his swallowing and reports that every time her father is hospitalized, he decompensates, swallow becomes acutely worse, then he slowly improves upon return home.  Had PEG 2019, removed early 2020. During last hospitalization at [facility] (D/Cd 1/15), it was  recommended that he remain on a mechanical soft diet with nectar thick liquids.  FEES had been planned but family declined.        SLP Plan  Continue with current plan of care       Recommendations  Diet recommendations: Dysphagia 1 (puree);Honey-thick liquid Liquids provided via: Cup Medication Administration: Crushed with puree Supervision: Full supervision/cueing for compensatory strategies Compensations: Minimize environmental distractions Postural Changes and/or Swallow Maneuvers: Seated upright 90 degrees                Oral Care Recommendations: Oral care BID Follow up Recommendations: Other (comment)(tba) SLP Visit Diagnosis: Dysphagia, oropharyngeal phase (R13.12) Plan: Continue with current plan of care       GO                [name] [name] 07/09/2019, 10:37 AM  [name] [name], [place] Office number [phone] Pager [phone]

## 2019-07-09 NOTE — TOC Initial Note (Signed)
Transition of Care (TOC) - Initial/Assessment Note    Patient Details  Name: [name] Jonasson Sr. MRN: [id number] Date of Birth: 1924/06/18  Transition of Care [facility]) CM/SW Contact:    Carles Collet, RN Phone Number: 07/09/2019, 4:09 PM  Clinical Narrative:        Damaris Schooner w patient's daughter on the phone. Patient lives with her and her spouse. They provide 24/7 supervision and want him to return home at DC.  He is active w [facility], and has oxygen he uses PRN through [place]. [name] states that he has no used it in over a year. We discussed turning it on and making it sure it still works. She states she may have [employer] come out and service it (change filters etc) before he comes home. Patient has cane, WC, RW, hospital bed, 3/1, shower seat "all of the equipment he needs," per [name].  He has had a PEG in the past and he no longer wanted to use it. He had it removed and been eating orally. [name] states that he had 2 episodes of aspiration PNA in 2020 and stated "2020 was a good year" with only two cases. She is knowledgeable about dysphagia diets and has states that her dad would not be interested in another PEG tube.  Patient will need PTAR at DC.      Archer,Sharon Daughter [phone]               Expected Discharge Plan: [place] Barriers to Discharge: Continued Medical Work up   Patient Goals and CMS Choice Patient states their goals for this hospitalization and ongoing recovery are:: to go home CMS Medicare.gov Compare Post Acute Care list provided to:: Other (Comment Required) Choice offered to / list presented to : Adult Children  Expected Discharge Plan and Services Expected Discharge Plan: [place]     Post Acute Care Choice: Home Health                             HH Arranged: PT, OT, RN [facility] Agency: [place] ([place]) Date HH Agency Contacted: 07/09/19 Time [place]: 25 Representative spoke with  at [place]: Corene Cornea  Prior Living Arrangements/Services                       Activities of Daily Living      Permission Sought/Granted                  Emotional Assessment              Admission diagnosis:  Pneumonia [J18.9] HCAP (healthcare-associated pneumonia) [J18.9] Patient Active Problem List   Diagnosis Date Noted  . HCAP (healthcare-associated pneumonia) 07/06/2019  . Skin lesion 12/28/2018  . Sacral ulcer ([place]) 10/17/2018  . H/O laceration of skin 08/24/2018  . Left knee pain 08/24/2018  . Oropharyngeal dysphagia 05/03/2018  . Advance care planning 05/02/2018  . Protein-calorie malnutrition, severe 03/29/2018  . Altered mental status   . Acute encephalopathy 03/22/2018  . Hypothyroidism 03/22/2018  . Pneumonia 03/10/2018  . Unsteady gait 01/12/2018  . HTN (hypertension) 01/12/2018  . Hyponatremia 01/12/2018  . Status post hip hemiarthroplasty 12/28/2017  . Chronic diastolic heart failure ([place]) 12/06/2017  . GERD (gastroesophageal reflux disease) 12/06/2017  . Aspiration into airway 02/17/2017  . Osteopenia 01/05/2017  . Anemia, iron deficiency 09/13/2015  . Fall 07/23/2015  . Herpes zoster  04/01/2015  . Chronic back pain 01/21/2015  . Spondylosis of lumbar region without myelopathy or radiculopathy 01/21/2015  . DDD (degenerative disc disease), lumbar 11/26/2014  . Thrombocytopenic ([place]) 09/19/2014  . Normocytic normochromic anemia 12/25/2013  . Solitary pulmonary nodule 03/08/2013  . Diastolic dysfunction [id number]  . Chronic cough 08/26/2012  . COPD (chronic obstructive pulmonary disease) with emphysema ([place]) 08/12/2012  . Chronic respiratory failure ([place]) 08/12/2012  . Benign prostatic hyperplasia 03/18/2011   PCP:  Tonia Ghent, MD Pharmacy:   [id number] - [place], [place] [address] Phone: [phone] Fax: [phone]  [id number]  [place], [place] - [address] [facility] [place] [address] [place] [address] Phone: [phone] Fax: [phone]     Social Determinants of Health (SDOH) Interventions    Readmission Risk Interventions No flowsheet data found.

## 2019-07-10 ENCOUNTER — Inpatient Hospital Stay: Payer: Medicare Other | Admitting: Family Medicine

## 2019-07-10 DIAGNOSIS — L8990 Pressure ulcer of unspecified site, unspecified stage: Secondary | ICD-10-CM | POA: Insufficient documentation

## 2019-07-10 LAB — COMPREHENSIVE METABOLIC PANEL
ALT: 15 U/L (ref 0–44)
AST: 25 U/L (ref 15–41)
Albumin: 2.1 g/dL — ABNORMAL LOW (ref 3.5–5.0)
Alkaline Phosphatase: 53 U/L (ref 38–126)
Anion gap: 10 (ref 5–15)
BUN: 12 mg/dL (ref 8–23)
CO2: 21 mmol/L — ABNORMAL LOW (ref 22–32)
Calcium: 8.1 mg/dL — ABNORMAL LOW (ref 8.9–10.3)
Chloride: 114 mmol/L — ABNORMAL HIGH (ref 98–111)
Creatinine, Ser: 1.06 mg/dL (ref 0.61–1.24)
GFR calc Af Amer: >60 mL/min (ref >60)
GFR calc non Af Amer: >60 mL/min (ref >60)
Glucose, Bld: 92 mg/dL (ref 70–99)
Potassium: 3.5 mmol/L (ref 3.5–5.1)
Sodium: 145 mmol/L (ref 135–145)
Total Bilirubin: 0.6 mg/dL (ref 0.3–1.2)
Total Protein: 5.3 g/dL — ABNORMAL LOW (ref 6.5–8.1)

## 2019-07-10 LAB — CBC WITH DIFFERENTIAL/PLATELET
Abs Immature Granulocytes: 0.02 10*3/uL (ref 0.00–0.07)
Basophils Absolute: 0 10*3/uL (ref 0.0–0.1)
Basophils Relative: 1 %
Eosinophils Absolute: 0.4 10*3/uL (ref 0.0–0.5)
Eosinophils Relative: 6 %
HCT: 28 % — ABNORMAL LOW (ref 39.0–52.0)
Hemoglobin: 9.1 g/dL — ABNORMAL LOW (ref 13.0–17.0)
Immature Granulocytes: 0 %
Lymphocytes Relative: 18 %
Lymphs Abs: 1.1 10*3/uL (ref 0.7–4.0)
MCH: 30.8 pg (ref 26.0–34.0)
MCHC: 32.5 g/dL (ref 30.0–36.0)
MCV: 94.9 fL (ref 80.0–100.0)
Monocytes Absolute: 0.4 10*3/uL (ref 0.1–1.0)
Monocytes Relative: 7 %
Neutro Abs: 4.3 10*3/uL (ref 1.7–7.7)
Neutrophils Relative %: 68 %
Platelets: 142 10*3/uL — ABNORMAL LOW (ref 150–400)
RBC: 2.95 MIL/uL — ABNORMAL LOW (ref 4.22–5.81)
RDW: 13.5 % (ref 11.5–15.5)
WBC: 6.2 10*3/uL (ref 4.0–10.5)
nRBC: 0 % (ref 0.0–0.2)

## 2019-07-10 LAB — CULTURE, BLOOD (ROUTINE X 2)

## 2019-07-10 LAB — IRON AND TIBC
Iron: 29 ug/dL — ABNORMAL LOW (ref 45–182)
Saturation Ratios: 27 % (ref 17.9–39.5)
TIBC: 109 ug/dL — ABNORMAL LOW (ref 250–450)
UIBC: 80 ug/dL

## 2019-07-10 LAB — PHOSPHORUS: Phosphorus: 2.4 mg/dL — ABNORMAL LOW (ref 2.5–4.6)

## 2019-07-10 LAB — FERRITIN: Ferritin: 200 ng/mL (ref 24–336)

## 2019-07-10 LAB — MAGNESIUM: Magnesium: 1.9 mg/dL (ref 1.7–2.4)

## 2019-07-10 MED ORDER — POTASSIUM PHOSPHATE MONOBASIC 500 MG PO TABS: 500.0000 mg | ORAL_TABLET | Freq: Three times a day (TID) | ORAL | Status: DC

## 2019-07-10 MED ORDER — K PHOS MONO-SOD PHOS DI & MONO 155-852-130 MG PO TABS
500.0000 mg | ORAL_TABLET | Freq: Three times a day (TID) | ORAL | Status: DC
  Administered 2019-07-10 – 2019-07-11 (×4): 500 mg via ORAL
  Filled 2019-07-10 (×7): qty 2

## 2019-07-10 MED ORDER — SODIUM CHLORIDE 0.9 % IV SOLN
125.0000 mg | Freq: Once | INTRAVENOUS | Status: AC
  Administered 2019-07-10: 125 mg via INTRAVENOUS
  Filled 2019-07-10: qty 10

## 2019-07-10 NOTE — Progress Notes (Signed)
Marland Kitchen  PROGRESS NOTE    [name] Toennies Sr.  [id number] DOB: March 23, 1925 DOA: 07/06/2019 PCP: Tonia Ghent, MD   Brief Narrative:   86 year old male with history of severe COPD, chronic diastolic CHF, chronic dysphagia, history of recurrent pneumonia was admitted to [facility] from 12/30-1/1 with severe bradycardia and second-degree AV block's, he had a pacemaker placed at that time, subsequently readmitted to [facility] from 1/3-1/15 with acute hypoxic respiratory failure he was diagnosed with Covid on 1/4 and treated for this with IV remdesivir and Decadron, he was also treated for community-acquired pneumonia. - He was discharged home a week ago from [facility] and presented to our emergency room with decreased urine output, constipation, weakness, per daughter they recommended Hospice. - In the emergency room labs were unremarkable, vital signs were stable, CT abdomen pelvis concerning for constipation, CT angiogram chest noted opacification of bronchus intermedius on the right with subtotal collapse/infiltrate in the right lower lobe.  07/10/19: Continue K+ replacement. Add phos replacement. Hgb is stable. SCr is improving. He is still lethargic and very slow to respond. New baseline? PT/OT rec HH. WOCN to address sacrum wound, neck wound today. He has some skin breakdown in groin but being addressed. Fe is low. Will supplement. Let get him a little more engaged before we let him go home as I do not believe this level of lethargy is his baseline.   Assessment & Plan:   Principal Problem:   HCAP (healthcare-associated pneumonia) Active Problems:   COPD (chronic obstructive pulmonary disease) with emphysema (HCC)   HTN (hypertension)   Pneumonia   Hypothyroidism   Chronic diastolic heart failure (HCC)   Pressure injury of skin  Aspiration pneumonia RLL Collapse Dysphagia     - Longstanding history of dysphagia for few years, likely worsened now     - SLP following, n.p.o.  recommended yesterday, they will follow and reassess     - continue gentle IV fluids     - Continue IV Unasyn; end 07/11/19     - with regards to right lower lobe lung collapse patient is too frail for bronchoscopy, continue flutter valve and hypertonic saline nebs, d/w pulmonary Dr.McQuaid yesterday     - SLP recs dys 1 diet  Encephalopathy, etiology unknown     - hold pm seroquel     - UA yesterday was unremarkable     - 07/10/19: more effort today to get him engaged; don't have a clear etiology here  Constipation     - for 1 week per daughter     - CT with evidence of large amount of stool     - continue laxatives, suppository     - Enema if needed  Recent Covid     - Positive for Covid on 1/4-hospitalized at [employer], treated with IV remdesivir and Decadron, will complete 21-day isolation on 1/25     - 07/09/19: Testing is negative     - 07/10/19: off isolation today  Chronic diastolic CHF     - Hold torsemide, continue hydralazine at lower dose     - 07/09/19: Scr is improving; monitor     - 07/10/19: baseline is 0.9-1; resolved  COPD     - stable, continue albuterol HFA as needed  Hypothyroidism     - continue Synthroid  Hypokalemia Hypophosphatemia     - Add K-phos  Skin cancer/neck lesion     - Follow-up with dermatology  Normocytic anemia     - had  some bleeding from a scab he picked last night     - has history of Fe deficiency     - check iron level; monitor     - 07/10/19: Fe deficient; get nulecit   Debility     - PT recs HHPT  DVT prophylaxis: SCDs Code Status: DNR Family Communication: None at bedside   Disposition Plan: To home with [facility] as he becomes less lethargic  Antimicrobials:  . Unasyn   ROS:  Unable to obtain d/t mentation  Subjective: Mumbling something this morning and intermittently saying "ok"  Objective: Vitals:   07/09/19 1724 07/09/19 2318 07/10/19 0805 07/10/19 1143  BP: (!) 125/58 131/67 137/66   Pulse: (!) 59 67 68     Resp: 16 20 20    Temp: 98.7 F (37.1 C) 97.9 F (36.6 C) 98.2 F (36.8 C)   TempSrc:      SpO2: 92% 93% 95% 96%  Weight:      Height:        Intake/Output Summary (Last 24 hours) at 07/10/2019 1501 Last data filed at 07/10/2019 0715 Gross per 24 hour  Intake 505.21 ml  Output --  Net 505.21 ml   Filed Weights   07/07/19 1100  Weight: 62.1 kg    Examination:  General: 86 y.o. male resting in bed in NAD Cardiovascular: RRR, +S1, S2, no m/g/r Respiratory: b/l rhonchi, better air movement today GI: BS+, NDNT, no masses noted, soft MSK: No e/c/c Neuro: slow, but following some commands  Data Reviewed: I have personally reviewed following labs and imaging studies.  CBC: Recent Labs  Lab 07/06/19 1116 07/08/19 0442 07/09/19 0458 07/09/19 1936 07/10/19 0358  WBC 6.3 9.1 5.9  --  6.2  NEUTROABS  --   --   --   --  4.3  HGB 10.5* 8.9* 8.4* 9.7* 9.1*  HCT 33.2* 26.8* 25.7* 29.2* 28.0*  MCV 98.2 94.0 94.5  --  94.9  PLT 161 138* 134*  --  [id number]*   Basic Metabolic Panel: Recent Labs  Lab 07/06/19 1116 07/08/19 0442 07/09/19 0458 07/10/19 0358  NA 138 142 147* 145  K 3.8 3.8 3.3* 3.5  CL 106 111 118* 114*  CO2 23 16* 19* 21*  GLUCOSE 88 69* 84 92  BUN 20 20 14 12   CREATININE 1.13 1.34* 1.23 1.06  CALCIUM 8.4* 7.8* 7.8* 8.1*  MG  --   --   --  1.9  PHOS  --   --   --  2.4*   GFR: Estimated Creatinine Clearance: 38.7 mL/min (by C-G formula based on SCr of 1.06 mg/dL). Liver Function Tests: Recent Labs  Lab 07/06/19 1155 07/09/19 0458 07/10/19 0358  AST 37 26 25  ALT 18 15 15   ALKPHOS 59 48 53  BILITOT 0.5 0.6 0.6  PROT 6.3* 5.1* 5.3*  ALBUMIN 2.7* 2.0* 2.1*   No results for input(s): LIPASE, AMYLASE in the last 168 hours. No results for input(s): AMMONIA in the last 168 hours. Coagulation Profile: No results for input(s): INR, PROTIME in the last 168 hours. Cardiac Enzymes: No results for input(s): CKTOTAL, CKMB, CKMBINDEX, TROPONINI in the last  168 hours. BNP (last 3 results) No results for input(s): PROBNP in the last 8760 hours. HbA1C: No results for input(s): HGBA1C in the last 72 hours. CBG: Recent Labs  Lab 07/08/19 2028  GLUCAP 62*   Lipid Profile: No results for input(s): CHOL, HDL, LDLCALC, TRIG, CHOLHDL, LDLDIRECT in the last 72 hours. Thyroid Function  Tests: No results for input(s): TSH, T4TOTAL, FREET4, T3FREE, THYROIDAB in the last 72 hours. Anemia Panel: Recent Labs    07/10/19 0358  FERRITIN 200  TIBC 109*  IRON 29*   Sepsis Labs: No results for input(s): PROCALCITON, LATICACIDVEN in the last 168 hours.  Recent Results (from the past 240 hour(s))  Urine culture     Status: Abnormal   Collection Time: 07/06/19  9:56 PM   Specimen: Urine, Catheterized  Result Value Ref Range Status   Specimen Description URINE, CATHETERIZED  Final   Special Requests NONE  Final   Culture (A)  Final    <10,000 COLONIES/mL INSIGNIFICANT GROWTH Performed at [facility], [address] [place], [place] [address]    Report Status 07/08/2019 FINAL  Final  SARS CORONAVIRUS 2 (TAT 6-24 HRS) Nasopharyngeal Nasopharyngeal Swab     Status: None   Collection Time: 07/06/19 11:26 PM   Specimen: Nasopharyngeal Swab  Result Value Ref Range Status   SARS Coronavirus 2 NEGATIVE NEGATIVE Final    Comment: (NOTE) SARS-CoV-2 target nucleic acids are NOT DETECTED. The SARS-CoV-2 RNA is generally detectable in upper and lower respiratory specimens during the acute phase of infection. Negative results do not preclude SARS-CoV-2 infection, do not rule out co-infections with other pathogens, and should not be used as the sole basis for treatment or other patient management decisions. Negative results must be combined with clinical observations, patient history, and epidemiological information. The expected result is Negative. Fact Sheet for Patients: SugarRoll.be Fact Sheet for Healthcare  Providers: https://www.woods-mathews.com/ This test is not yet approved or cleared by the [place] FDA and  has been authorized for detection and/or diagnosis of SARS-CoV-2 by FDA under an Emergency Use Authorization (EUA). This EUA will remain  in effect (meaning this test can be used) for the duration of the COVID-19 declaration under Section 56 4(b)(1) of the Act, 21 U.S.C. section 360bbb-3(b)(1), unless the authorization is terminated or revoked sooner. Performed at [facility], [place] [address], [place], [place] [address]   Blood culture (routine x 2)     Status: None (Preliminary result)   Collection Time: 07/07/19 12:05 AM   Specimen: BLOOD  Result Value Ref Range Status   Specimen Description BLOOD RIGHT ANTECUBITAL  Final   Special Requests   Final    BOTTLES DRAWN AEROBIC AND ANAEROBIC Blood Culture adequate volume   Culture   Final    NO GROWTH 3 DAYS Performed at [facility], [place] [address], [place], [place] [address]    Report Status PENDING  Incomplete  Blood culture (routine x 2)     Status: Abnormal   Collection Time: 07/07/19 12:10 AM   Specimen: BLOOD  Result Value Ref Range Status   Specimen Description BLOOD LEFT ANTECUBITAL  Final   Special Requests   Final    BOTTLES DRAWN AEROBIC AND ANAEROBIC Blood Culture results may not be optimal due to an inadequate volume of blood received in culture bottles   Culture  Setup Time   Final    AEROBIC BOTTLE ONLY GRAM POSITIVE COCCI CRITICAL RESULT CALLED TO, READ BACK BY AND VERIFIED WITH: V BRYK PHARMD 07/08/19 0456 JDW    Culture (A)  Final    STAPHYLOCOCCUS SPECIES (COAGULASE NEGATIVE) THE SIGNIFICANCE OF ISOLATING THIS ORGANISM FROM A SINGLE SET OF BLOOD CULTURES WHEN MULTIPLE SETS ARE DRAWN IS UNCERTAIN. PLEASE NOTIFY THE MICROBIOLOGY DEPARTMENT WITHIN ONE WEEK IF SPECIATION AND SENSITIVITIES ARE REQUIRED. Performed at  [facility], [place] [address], [place], [place] [address]     Report Status 07/10/2019 FINAL  Final      Radiology Studies: No results found.   Scheduled Meds: . aspirin  81 mg Oral Daily  . levothyroxine  100 mcg Oral Q0600  . pantoprazole  40 mg Oral Q1200  . polyethylene glycol  17 g Oral Daily  . senna-docusate  1 tablet Oral BID  . sodium chloride HYPERTONIC  4 mL Nebulization BID   Continuous Infusions: . ampicillin-sulbactam (UNASYN) IV 3 g (07/10/19 1252)     LOS: 4 days    Time spent: 25 minutes spent in the coordination of care today.    Jonnie Finner, DO [facility]  If 7PM-7AM, please contact night-coverage [email or web address] 07/10/2019, 3:01 PM

## 2019-07-10 NOTE — Evaluation (Signed)
Occupational Therapy Evaluation Patient Details Name: [name] Retana Sr. MRN: [id number] DOB: 01/01/1925 Today's Date: 07/10/2019    History of Present Illness [name] [name]  is a 86 y.o. male, w Hypothyroidism, Chronic dysphagia, Anemia, CHF, Hypertension, s/p pacer for bradycardia 2020, Copd, left neck lesion/skin cancer,  h/o covid-19  w RLL pneumonia, 06/18/19-06/30/19 admitted [facility] (isolation ends 1/25), presents for lack of urination and constipation but notes cough w green sputum. CT chest subtotal collapse/infiltrate RLL; encephalopathy; 07/08/19 found to have bleeding and clots from left neck (with bleeding on his left fingers)    Clinical Impression   Pt admitted for above and limited by problem list below, including generalized weakness, impaired cognition and decreased activity tolerance.  PT spoke to daughter who reports needing assist for most ADLs, but able to feed self. Patient currently able to follow simple 1 step commands with increased time, able to verbalize name/birthday but otherwise noted mumbled speech. Requires min assist for grooming, and max-total assist for remainder for ADLs.  Max assist to fully roll towards R side, attempted to EOB but requires total assist for partial transition only.  Patient will benefit from continued OT services while admitted and after dc at [facility] level with 24/7 support, to optimize participation in ADLs and decrease burden of care.     Follow Up Recommendations  Home health OT;Supervision/Assistance - 24 hour    Equipment Recommendations  None recommended by OT    Recommendations for Other Services       Precautions / Restrictions Precautions Precautions: Fall Restrictions Weight Bearing Restrictions: No      Mobility Bed Mobility Overal bed mobility: Needs Assistance Bed Mobility: Rolling;Sidelying to Sit Rolling: Max assist         General bed mobility comments: patient partial sidelying, max assist to fully roll  towards R side and total assist to attempt transition to EOB (partial EOB and unable to progress further with 1+ assist only)  Transfers                 General transfer comment: deferred    Balance                                           ADL either performed or assessed with clinical judgement   ADL Overall ADL's : Needs assistance/impaired     Grooming: Bed level;Minimal assistance Grooming Details (indicate cue type and reason): able to wash face and hands  Upper Body Bathing: Moderate assistance;Sitting   Lower Body Bathing: Total assistance;Bed level   Upper Body Dressing : Bed level;Maximal assistance   Lower Body Dressing: Total assistance;Bed level     Toilet Transfer Details (indicate cue type and reason): deferred          Functional mobility during ADLs: Total assistance(bed level only ) General ADL Comments: pt limited by weakness, cognition     Vision   Vision Assessment?: No apparent visual deficits     Perception     Praxis      Pertinent Vitals/Pain Pain Assessment: Faces Faces Pain Scale: Hurts a little bit Pain Location: generalized when attempting to EOB  Pain Descriptors / Indicators: Discomfort;Grimacing Pain Intervention(s): Monitored during session;Repositioned     Hand Dominance Right   Extremity/Trunk Assessment Upper Extremity Assessment Upper Extremity Assessment: Generalized weakness   Lower Extremity Assessment Lower Extremity Assessment: Defer to PT evaluation  Communication Communication Communication: Expressive difficulties   Cognition Arousal/Alertness: Awake/alert Behavior During Therapy: Flat affect Overall Cognitive Status: No family/caregiver present to determine baseline cognitive functioning Area of Impairment: Following commands;Memory;Problem solving;Awareness                     Memory: Decreased short-term memory Following Commands: Follows one step commands with  increased time;Follows one step commands inconsistently   Awareness: Intellectual Problem Solving: Slow processing;Decreased initiation;Difficulty sequencing;Requires verbal cues General Comments: patient able to verbalize name and birthday, following simple commands with increased time; difficulty sequencing and mumbled speech at times    General Comments  VSS    Exercises     Shoulder Instructions      Home Living Family/patient expects to be discharged to:: Private residence Living Arrangements: Children(daughter) Available Help at Discharge: Family;Available 24 hours/day Type of Home: House       Home Layout: One level     Bathroom Shower/Tub: Teacher, early years/pre: Standard     Home Equipment: Tub bench;Walker - 2 wheels;Bedside commode;Hospital bed;Wheelchair - Education administrator (comment)(lift)          Prior Functioning/Environment Level of Independence: Needs assistance  Gait / Transfers Assistance Needed: prior to December hospitalization, walking with minguard assist and RW; transfers min assist; after most recent discharge (1/15) pt had one HHPT session, pivoted to w/c one person, initiated standing at sink (could clear buttocks but not fully stand) ADL's / Homemaking Assistance Needed: prior to December hospitalization: assisting with bathing/dressing (using tub bench); fed himself; after recent return home, dependent with bathing/dressing but able to feed himself with close supervision (due to dysphagia) Communication / Swallowing Assistance Needed: denies HOH; reports history of "delirium" each time he goes into hospital Comments: per pt eval         OT Problem List: Decreased strength;Decreased activity tolerance;Impaired balance (sitting and/or standing);Decreased safety awareness;Decreased knowledge of precautions;Decreased knowledge of use of DME or AE;Decreased cognition      OT Treatment/Interventions: Self-care/ADL training;DME and/or AE  instruction;Energy conservation;Therapeutic activities;Balance training;Patient/family education;Cognitive remediation/compensation;Therapeutic exercise    OT Goals(Current goals can be found in the care plan section) Acute Rehab OT Goals Patient Stated Goal: none stated OT Goal Formulation: Patient unable to participate in goal setting Time For Goal Achievement: 07/24/19 Potential to Achieve Goals: Fair  OT Frequency: Min 2X/week   Barriers to D/C:            Co-evaluation              AM-PAC OT "6 Clicks" Daily Activity     Outcome Measure Help from another person eating meals?: A Lot Help from another person taking care of personal grooming?: A Lot Help from another person toileting, which includes using toliet, bedpan, or urinal?: Total Help from another person bathing (including washing, rinsing, drying)?: A Lot Help from another person to put on and taking off regular upper body clothing?: Total Help from another person to put on and taking off regular lower body clothing?: Total 6 Click Score: 9   End of Session Nurse Communication: Mobility status  Activity Tolerance: Patient tolerated treatment well Patient left: in bed;with call bell/phone within reach;with bed alarm set  OT Visit Diagnosis: Other abnormalities of gait and mobility (R26.89);Muscle weakness (generalized) (M62.81);Other symptoms and signs involving cognitive function                Time: 0950-1001 OT Time Calculation (min): 11 min Charges:  OT General Charges $OT  Visit: 1 Visit OT Evaluation $OT Eval Moderate Complexity: [place], OT Acute Rehabilitation Services Pager [phone] Office [phone]   Delight Stare 07/10/2019, 10:55 AM

## 2019-07-10 NOTE — Consult Note (Signed)
WOC Nurse Consult Note: Reason for Consult: sacrum and neck  Wound type: Stage 2 Pressure injury; left upper buttock/sacral region Full thickness wound from central line Pressure Injury POA: Yes Measurement: Stage 2 Pressure Injury; 3.5cm x 2.0cm x 0.1cm  Left lateral neck; 1cm x 1cm x 0.5cm  Wound bed: Sacrum; yellow; epithelial buds Neck pink, clean but with death Drainage (amount, consistency, odor); bloody drainage Periwound:intact  Dressing procedure/placement/frequency: Pack neck wound with silver hydrofiber; cover with foam Silicone foam to the sacrum/buttock Turn and reposition patient off of the affected areas.   Discussed POC with patient and bedside nurse.  Re consult if needed, will not follow at this time. Thanks  Tsuneo Faison [employer], RN,CWOCN, CNS, [place] [phone])

## 2019-07-11 DIAGNOSIS — L89159 Pressure ulcer of sacral region, unspecified stage: Secondary | ICD-10-CM

## 2019-07-11 DIAGNOSIS — G9341 Metabolic encephalopathy: Secondary | ICD-10-CM

## 2019-07-11 LAB — CBC WITH DIFFERENTIAL/PLATELET
Abs Immature Granulocytes: 0.07 10*3/uL (ref 0.00–0.07)
Basophils Absolute: 0 10*3/uL (ref 0.0–0.1)
Basophils Relative: 1 %
Eosinophils Absolute: 0.4 10*3/uL (ref 0.0–0.5)
Eosinophils Relative: 7 %
HCT: 30.6 % — ABNORMAL LOW (ref 39.0–52.0)
Hemoglobin: 9.9 g/dL — ABNORMAL LOW (ref 13.0–17.0)
Immature Granulocytes: 1 %
Lymphocytes Relative: 25 %
Lymphs Abs: 1.4 10*3/uL (ref 0.7–4.0)
MCH: 31.4 pg (ref 26.0–34.0)
MCHC: 32.4 g/dL (ref 30.0–36.0)
MCV: 97.1 fL (ref 80.0–100.0)
Monocytes Absolute: 0.5 10*3/uL (ref 0.1–1.0)
Monocytes Relative: 8 %
Neutro Abs: 3.3 10*3/uL (ref 1.7–7.7)
Neutrophils Relative %: 58 %
Platelets: 149 10*3/uL — ABNORMAL LOW (ref 150–400)
RBC: 3.15 MIL/uL — ABNORMAL LOW (ref 4.22–5.81)
RDW: 13.8 % (ref 11.5–15.5)
WBC: 5.7 10*3/uL (ref 4.0–10.5)
nRBC: 0 % (ref 0.0–0.2)

## 2019-07-11 LAB — RENAL FUNCTION PANEL
Albumin: 2.1 g/dL — ABNORMAL LOW (ref 3.5–5.0)
Anion gap: 11 (ref 5–15)
BUN: 11 mg/dL (ref 8–23)
CO2: 22 mmol/L (ref 22–32)
Calcium: 8 mg/dL — ABNORMAL LOW (ref 8.9–10.3)
Chloride: 114 mmol/L — ABNORMAL HIGH (ref 98–111)
Creatinine, Ser: 1.03 mg/dL (ref 0.61–1.24)
GFR calc Af Amer: >60 mL/min (ref >60)
GFR calc non Af Amer: >60 mL/min (ref >60)
Glucose, Bld: 93 mg/dL (ref 70–99)
Phosphorus: 3.6 mg/dL (ref 2.5–4.6)
Potassium: 3.4 mmol/L — ABNORMAL LOW (ref 3.5–5.1)
Sodium: 147 mmol/L — ABNORMAL HIGH (ref 135–145)

## 2019-07-11 MED ORDER — POTASSIUM CHLORIDE CRYS ER 20 MEQ PO TBCR: 40.0000 meq | EXTENDED_RELEASE_TABLET | Freq: Every day | ORAL | 0 refills | Status: DC

## 2019-07-11 NOTE — Progress Notes (Signed)
Physical Therapy Treatment Patient Details Name: [name] Baratta Sr. MRN: [id number] DOB: [birth year] Today's Date: 07/11/2019    History of Present Illness [name] [name]  is a 86 y.o. male, w Hypothyroidism, Chronic dysphagia, Anemia, CHF, Hypertension, s/p pacer for bradycardia 2020, Copd, left neck lesion/skin cancer,  h/o covid-19  w RLL pneumonia, 06/18/19-06/30/19 admitted [facility] (isolation ends 1/25), presents for lack of urination and constipation but notes cough w green sputum. CT chest subtotal collapse/infiltrate RLL; encephalopathy; 07/08/19 found to have bleeding and clots from left neck (with bleeding on his left fingers)     PT Comments    Patient progressing this session to EOB and OOB activity.  Able to stand with RW and 2 person assist to take steps to pivot with crouch posture.  Unsure of prior functional level, but feel could go home with capable 24 hour assist.  Not sure of safety with car transfer, may need ambulance transport.  PT to follow acutely if not d/c.   Follow Up Recommendations  Home health PT;Supervision/Assistance - 24 hour(as family provided PTA)     Equipment Recommendations  None recommended by PT    Recommendations for Other Services       Precautions / Restrictions Precautions Precautions: Fall Precaution Comments: R knee flexion contracture    Mobility  Bed Mobility Overal bed mobility: Needs Assistance Bed Mobility: Supine to Sit Rolling: Mod assist   Supine to sit: HOB elevated     General bed mobility comments: pt bringing legs off bed, assist for lifting trunk  Transfers Overall transfer level: Needs assistance Equipment used: Rolling walker (2 wheeled) Transfers: Sit to/from [facility] Sit to Stand: Mod assist;+2 physical assistance;From elevated surface Stand pivot transfers: Mod assist;Max assist;+2 physical assistance       General transfer comment: with RW able to stand x 2 (first time noted soiled  with BM so returned to sidelying for cleaning,) crouch posture with both knees bent, but able to step slowly with very short shuffling steps to turn and chair brought under pt and he sat in recliner  Ambulation/Gait             General Gait Details: not able due to flexion contracture, limited activity tolerance   Stairs             Wheelchair Mobility    Modified Rankin (Stroke Patients Only)       Balance Overall balance assessment: Needs assistance   Sitting balance-Leahy Scale: Fair     Standing balance support: Bilateral upper extremity supported Standing balance-Leahy Scale: Poor                              Cognition Arousal/Alertness: Awake/alert Behavior During Therapy: Flat affect Overall Cognitive Status: No family/caregiver present to determine baseline cognitive functioning Area of Impairment: Memory;Attention;Following commands;Safety/judgement                   Current Attention Level: Sustained Memory: Decreased short-term memory Following Commands: Follows one step commands consistently;Follows one step commands with increased time Safety/Judgement: Decreased awareness of deficits   Problem Solving: Slow processing;Decreased initiation;Requires verbal cues        Exercises      General Comments General comments (skin integrity, edema, etc.): vss      Pertinent Vitals/Pain Faces Pain Scale: Hurts a little bit Pain Location: R LE in sidleying Pain Descriptors / Indicators: Discomfort;Tightness Pain Intervention(s): Monitored during session;Repositioned  Home Living                      Prior Function            PT Goals (current goals can now be found in the care plan section) Progress towards PT goals: Progressing toward goals    Frequency    Min 3X/week      PT Plan Current plan remains appropriate    Co-evaluation              AM-PAC PT "6 Clicks" Mobility   Outcome Measure   Help needed turning from your back to your side while in a flat bed without using bedrails?: Total Help needed moving from lying on your back to sitting on the side of a flat bed without using bedrails?: Total Help needed moving to and from a bed to a chair (including a wheelchair)?: A Lot Help needed standing up from a chair using your arms (e.g., wheelchair or bedside chair)?: Total Help needed to walk in hospital room?: Total Help needed climbing 3-5 steps with a railing? : Total 6 Click Score: 7    End of Session Equipment Utilized During Treatment: Gait belt Activity Tolerance: Patient limited by fatigue Patient left: in chair;with call bell/phone within reach   PT Visit Diagnosis: Muscle weakness (generalized) (M62.81);Difficulty in walking, not elsewhere classified (R26.2)     Time: 0920-1000 PT Time Calculation (min) (ACUTE ONLY): 40 min  Charges:  $Therapeutic Activity: 38-52 mins                     Magda Kiel, [place] Acute Rehabilitation Services [phone] 07/11/2019    Reginia Naas 07/11/2019, 1:22 PM

## 2019-07-11 NOTE — Discharge Summary (Addendum)
. Physician Discharge Summary  [name] Mcclees Sr. [id number] DOB: December 20, 1924 DOA: 07/06/2019  PCP: Tonia Ghent, MD  Admit date: 07/06/2019 Discharge date: 07/11/2019  Admitted From: Home Disposition:  Discharged to home.   Recommendations for Outpatient Follow-up:  1. Follow up with PCP in 1 weeks 2. Please obtain BMP/CBC in one week  Discharge Condition: Stable  CODE STATUS: DNR   Brief/Interim Summary: 86 year old male with history of severe COPD, chronic diastolic CHF, chronic dysphagia, history of recurrent pneumonia was admitted to [facility] from 12/30-1/1 with severe bradycardia and second-degree AV block's, he had a pacemaker placed at that time, subsequently readmitted to [facility] from 1/3-1/15 with acute hypoxic respiratory failure he was diagnosed with Covid on 1/4 and treated for this with IV remdesivir and Decadron, he was also treated for community-acquired pneumonia. - He was discharged home a week ago from [facility] and presented to our emergency room with decreased urine output, constipation, weakness, per daughter they recommended Hospice. - In the emergency room labs were unremarkable, vital signs were stable, CT abdomen pelvis concerning for constipation, CT angiogram chest noted opacification of bronchus intermedius on the right with subtotal collapse/infiltrate in the right lower lobe.  07/11/19: More interactive today. Spoke with dtr this morning. She believes that he is close to baseline and this confusion always happens in the hospital. K+ is a little low today. Will send home with supplement for 5 days. Otherwise, labs are good. He's stable for d/c to home.   Discharge Diagnoses:  Principal Problem:   HCAP (healthcare-associated pneumonia) Active Problems:   COPD (chronic obstructive pulmonary disease) with emphysema (HCC)   HTN (hypertension)   Pneumonia   Hypothyroidism   Chronic diastolic heart failure (HCC)   Pressure injury of  skin  Aspiration pneumonia RLL Collapse Dysphagia - Longstanding history of dysphagia for few years, likely worsened now - SLP following, n.p.o. recommended yesterday, they will follow and reassess - continue gentle IV fluids - Continue IV Unasyn; end 07/11/19 - with regards to right lower lobe lung collapse patient is too frail for bronchoscopy, continue flutter valve and hypertonic saline nebs, d/w pulmonary Dr.McQuaid yesterday - SLP recs dys 1 diet  Encephalopathy, etiology unknown - hold pm seroquel - UA yesterday was unremarkable - 07/10/19: more effort today to get him engaged; don't have a clear etiology here     - 07/11/19: engaged today; spoke with dtr this AM, she feels he is about at baseline  Constipation - for 1 week per daughter - CT with evidence of large amount of stool - continue laxatives, suppository - Enema if needed  Recent Covid - Positive for Covid on 1/4-hospitalized at [place], treated with IV remdesivir and Decadron, will complete 21-day isolation on 1/25 - 07/09/19: Testing is negative     - 07/10/19: off isolation today  Chronic diastolic CHF - Hold torsemide, continue hydralazine at lower dose - 07/09/19: Scr is improving; monitor     - 07/10/19: baseline is 0.9-1; resolved  COPD - stable, continue albuterol HFA as needed  Hypothyroidism - continue Synthroid  Hypokalemia Hypophosphatemia - Add K-phos     - phos is resolved     - will send home with 5 days daily K+; follow up oupt  Skin cancer/neck lesion - Follow-up with dermatology  Normocytic anemia - had some bleeding from a scab he picked last night - has history of Fe deficiency - check iron level; monitor     - 07/10/19: Fe deficient; get nulecit      -  07/11/19: s/p iron infusion. Hgb is good; no signs of bleed.   Debility - PT recs HHPT     - OT recs HHOT  Discharge  Instructions   Allergies as of 07/11/2019      Reactions   Codeine Nausea And Vomiting      Medication List    STOP taking these medications   azithromycin 250 MG tablet Commonly known as: ZITHROMAX   torsemide 20 MG tablet Commonly known as: DEMADEX   verapamil 80 MG tablet Commonly known as: CALAN     TAKE these medications   albuterol (2.5 MG/3ML) 0.083% nebulizer solution Commonly known as: PROVENTIL Take 3 mLs (2.5 mg total) by nebulization every 6 (six) hours as needed for wheezing or shortness of breath (dx J43.2).   albuterol 108 (90 Base) MCG/ACT inhaler Commonly known as: VENTOLIN HFA Inhale 2 puffs into the lungs every 6 (six) hours as needed for shortness of breath or wheezing.   aspirin EC 81 MG tablet Take 81 mg by mouth daily.   calcium-vitamin D 500-200 MG-UNIT tablet Take 1 tablet by mouth daily.   carboxymethylcellulose 0.5 % Soln Commonly known as: REFRESH PLUS Place 1 drop into both eyes daily as needed (dry eyes).   DESITIN CREAMY EX Apply 1 application topically daily as needed (wound care).   ferrous sulfate 325 (65 FE) MG EC tablet Take 325 mg by mouth daily with breakfast.   Fluticasone-Umeclidin-Vilant 100-62.5-25 MCG/INH Aepb Take 1 puff by mouth daily.   Flutter Devi Use as directed   guaiFENesin 100 MG/5ML liquid Commonly known as: ROBITUSSIN Take 10 mLs (200 mg total) by mouth 3 (three) times daily as needed for cough. What changed: reasons to take this   hydrALAZINE 50 MG tablet Commonly known as: APRESOLINE Take 50 mg by mouth every 8 (eight) hours.   levothyroxine 100 MCG tablet Commonly known as: SYNTHROID PLACE 1 TABLET INTO FEEDING TUBE DAILY. What changed: See the new instructions.   omeprazole 40 MG capsule Commonly known as: PRILOSEC TAKE 1 CAPSULE BY MOUTH EVERY DAY What changed: how much to take   polyethylene glycol 17 g packet Commonly known as: MIRALAX / GLYCOLAX Take 17 g by mouth daily as needed for  mild constipation.   potassium chloride SA 20 MEQ tablet Commonly known as: KLOR-CON Take 2 tablets (40 mEq total) by mouth daily for 5 doses.   QUEtiapine 25 MG tablet Commonly known as: SEROQUEL TAKE 1/2 TABLET BY MOUTH AT BEDTIME DAILY What changed:   how much to take  how to take this  when to take this       Allergies  Allergen Reactions  . Codeine Nausea And Vomiting    Procedures/Studies: CT Angio Chest PE W and/or Wo Contrast  Result Date: 07/06/2019 CLINICAL DATA:  Pulmonary emboli suspected in the right lower lobe at abdominal CT. EXAM: CT ANGIOGRAPHY CHEST WITH CONTRAST TECHNIQUE: Multidetector CT imaging of the chest was performed using the standard protocol during bolus administration of intravenous contrast. Multiplanar CT image reconstructions and MIPs were obtained to evaluate the vascular anatomy. CONTRAST:  130mL OMNIPAQUE IOHEXOL 350 MG/ML SOLN COMPARISON:  Abdominal CT same day FINDINGS: Cardiovascular: Pulmonary arterial opacification is good. There are no pulmonary emboli. Specific attention to the right lower lobe pulmonary arterial tree does not show embolic disease. Heart size is normal. The patient has a pericardial effusion. There is extensive coronary artery calcification. There is advanced atherosclerotic disease of the thoracic aorta. Mediastinum/Nodes: No paratracheal adenopathy. Mild  nodal prominence in the Peri carinal region, possibly reactive. Lungs/Pleura: Upper lungs show emphysema and mild scarring. There is mild atelectasis at the left lung base. The right mainstem bronchus is patent as is the right upper lobe bronchus. The bronchus intermedia is opacified. There is central volume loss in the right lower lobe. All of this may be inflammatory disease, but I cannot rule out neoplastic disease particularly in the right lower lobe. Follow-up will be necessary after treatment. Upper Abdomen: Negative Musculoskeletal: Ordinary spinal degenerative changes.  Review of the MIP images confirms the above findings. IMPRESSION: No pulmonary emboli. Advanced aortic atherosclerosis. Coronary artery calcification. Pericardial effusion. Opacification of the bronchus intermedia on the right with subtotal collapse/infiltrate in the right lower lobe. Findings are most consistent with inflammatory disease. Cannot rule out possibility of tumor in this region. Follow-up after treatment will be necessary. Aortic Atherosclerosis (ICD10-I70.0) and Emphysema (ICD10-J43.9). Electronically Signed   By: Nelson Chimes M.D.   On: 07/06/2019 21:55   CT ABDOMEN PELVIS W CONTRAST  Result Date: 07/06/2019 CLINICAL DATA:  Constipation. EXAM: CT ABDOMEN AND PELVIS WITH CONTRAST TECHNIQUE: Multidetector CT imaging of the abdomen and pelvis was performed using the standard protocol following bolus administration of intravenous contrast. CONTRAST:  137mL OMNIPAQUE IOHEXOL 300 MG/ML  SOLN COMPARISON:  None. FINDINGS: Lower chest: Mild atelectasis and/or infiltrate is seen within the right lung base. There is a small pericardial effusion. Ill-defined filling defects are suspected within the posterior lower lobe branches of the right pulmonary artery. Hepatobiliary: No focal liver abnormality is seen. Status post cholecystectomy. No biliary dilatation. Pancreas: Unremarkable. No pancreatic ductal dilatation or surrounding inflammatory changes. Spleen: An 8 mm cystic appearing areas seen within an otherwise normal-appearing spleen. Adrenals/Urinary Tract: The adrenal glands are slightly nodular in appearance. Kidneys are normal in size, without renal calculi or hydronephrosis. A 1.8 cm simple cyst is seen within the lower pole of the right kidney. Bladder is unremarkable. Stomach/Bowel: Stomach is within normal limits. The appendix is not identified. No evidence of bowel dilatation. A large amount of stool is seen throughout the colon. Noninflamed diverticula are seen throughout the sigmoid colon.  Vascular/Lymphatic: Marked severity aortic atherosclerosis. No enlarged abdominal or pelvic lymph nodes. Reproductive: The prostate gland is moderately enlarged. Other: No abdominal wall hernia or abnormality. No abdominopelvic ascites. Musculoskeletal: The muscular sugar of the anteromedial aspect of the left thigh is heterogeneous in appearance and contains multiple small tortuous vessels (axial CT images 72 through 96, CT series number 2). There is a total right hip replacement with associated streak artifact and subsequently limited evaluation of the adjacent osseous and soft tissue structures. Multilevel degenerative changes seen throughout the lumbar spine with evidence of prior vertebroplasty at the level of the L2 vertebral body. IMPRESSION: 1. Ill-defined areas of low attenuation involving multiple posterior lower lobe branches of the right pulmonary artery. While this may represent areas of artifact, pulmonary embolism cannot be excluded. Correlation with chest CT a is recommended. 2. Mild right basilar atelectasis and/or infiltrate. 3. Evidence of prior cholecystectomy. 4. Large amount of stool throughout the large bowel. 5. Simple cyst within the right kidney. 6. Total right hip replacement. 7. Enlarged prostate gland. Electronically Signed   By: Virgina Norfolk M.D.   On: 07/06/2019 20:01      Subjective: "That's good."  Discharge Exam: Vitals:   07/11/19 0744 07/11/19 0802  BP:  (!) 154/75  Pulse: 69 65  Resp: 18 16  Temp:  97.9 F (36.6 C)  SpO2: 96% 94%   Vitals:   07/10/19 2001 07/10/19 2312 07/11/19 0744 07/11/19 0802  BP:  135/64  (!) 154/75  Pulse:  63 69 65  Resp:  20 18 16   Temp:  97.8 F (36.6 C)  97.9 F (36.6 C)  TempSrc:      SpO2: 93% 93% 96% 94%  Weight:      Height:        General: 86 y.o. male resting in bed in NAD Cardiovascular: RRR, +S1, S2, no m/g/r Respiratory: CTABL, no w/r/r, normal WOB GI: BS+, NDNT, no masses noted, no organomegaly  noted MSK: No e/c/c Neuro: Alert to name, follows commands Psyc: calm/cooperative  The results of significant diagnostics from this hospitalization (including imaging, microbiology, ancillary and laboratory) are listed below for reference.     Microbiology: Recent Results (from the past 240 hour(s))  Urine culture     Status: Abnormal   Collection Time: 07/06/19  9:56 PM   Specimen: Urine, Catheterized  Result Value Ref Range Status   Specimen Description URINE, CATHETERIZED  Final   Special Requests NONE  Final   Culture (A)  Final    <10,000 COLONIES/mL INSIGNIFICANT GROWTH Performed at [facility], [address] [place], [place] [address]    Report Status 07/08/2019 FINAL  Final  SARS CORONAVIRUS 2 (TAT 6-24 HRS) Nasopharyngeal Nasopharyngeal Swab     Status: None   Collection Time: 07/06/19 11:26 PM   Specimen: Nasopharyngeal Swab  Result Value Ref Range Status   SARS Coronavirus 2 NEGATIVE NEGATIVE Final    Comment: (NOTE) SARS-CoV-2 target nucleic acids are NOT DETECTED. The SARS-CoV-2 RNA is generally detectable in upper and lower respiratory specimens during the acute phase of infection. Negative results do not preclude SARS-CoV-2 infection, do not rule out co-infections with other pathogens, and should not be used as the sole basis for treatment or other patient management decisions. Negative results must be combined with clinical observations, patient history, and epidemiological information. The expected result is Negative. Fact Sheet for Patients: SugarRoll.be Fact Sheet for Healthcare Providers: https://www.woods-mathews.com/ This test is not yet approved or cleared by the [place] FDA and  has been authorized for detection and/or diagnosis of SARS-CoV-2 by FDA under an Emergency Use Authorization (EUA). This EUA will remain  in effect (meaning this test can be used) for the duration of the COVID-19  declaration under Section 56 4(b)(1) of the Act, 21 U.S.C. section 360bbb-3(b)(1), unless the authorization is terminated or revoked sooner. Performed at [facility], [place] [address] [place], [place] [address]   Blood culture (routine x 2)     Status: None (Preliminary result)   Collection Time: 07/07/19 12:05 AM   Specimen: BLOOD  Result Value Ref Range Status   Specimen Description BLOOD RIGHT ANTECUBITAL  Final   Special Requests   Final    BOTTLES DRAWN AEROBIC AND ANAEROBIC Blood Culture adequate volume   Culture   Final    NO GROWTH 3 DAYS Performed at [facility], [place] [address] [place], [place] [address]    Report Status PENDING  Incomplete  Blood culture (routine x 2)     Status: Abnormal   Collection Time: 07/07/19 12:10 AM   Specimen: BLOOD  Result Value Ref Range Status   Specimen Description BLOOD LEFT ANTECUBITAL  Final   Special Requests   Final    BOTTLES DRAWN AEROBIC AND ANAEROBIC Blood Culture results may not be optimal due to an  inadequate volume of blood received in culture bottles   Culture  Setup Time   Final    AEROBIC BOTTLE ONLY GRAM POSITIVE COCCI CRITICAL RESULT CALLED TO, READ BACK BY AND VERIFIED WITH: V BRYK PHARMD 07/08/19 0456 JDW    Culture (A)  Final    STAPHYLOCOCCUS SPECIES (COAGULASE NEGATIVE) THE SIGNIFICANCE OF ISOLATING THIS ORGANISM FROM A SINGLE SET OF BLOOD CULTURES WHEN MULTIPLE SETS ARE DRAWN IS UNCERTAIN. PLEASE NOTIFY THE MICROBIOLOGY DEPARTMENT WITHIN ONE WEEK IF SPECIATION AND SENSITIVITIES ARE REQUIRED. Performed at [facility], [place] [address], [place], [place] [address]    Report Status 07/10/2019 FINAL  Final     Labs: BNP (last 3 results) No results for input(s): BNP in the last 8760 hours. Basic Metabolic Panel: Recent Labs  Lab 07/06/19 1116 07/08/19 0442 07/09/19 0458 07/10/19 0358 07/11/19 0749  NA 138 142 147* 145 147*  K 3.8 3.8 3.3* 3.5 3.4*  CL 106 111 118* 114* 114*  CO2 23 16* 19*  21* 22  GLUCOSE 88 69* 84 92 93  BUN 20 20 14 12 11   CREATININE 1.13 1.34* 1.23 1.06 1.03  CALCIUM 8.4* 7.8* 7.8* 8.1* 8.0*  MG  --   --   --  1.9  --   PHOS  --   --   --  2.4* 3.6   Liver Function Tests: Recent Labs  Lab 07/06/19 1155 07/09/19 0458 07/10/19 0358 07/11/19 0749  AST 37 26 25  --   ALT 18 15 15   --   ALKPHOS 59 48 53  --   BILITOT 0.5 0.6 0.6  --   PROT 6.3* 5.1* 5.3*  --   ALBUMIN 2.7* 2.0* 2.1* 2.1*   No results for input(s): LIPASE, AMYLASE in the last 168 hours. No results for input(s): AMMONIA in the last 168 hours. CBC: Recent Labs  Lab 07/06/19 1116 07/06/19 1116 07/08/19 0442 07/09/19 0458 07/09/19 1936 07/10/19 0358 07/11/19 0749  WBC 6.3  --  9.1 5.9  --  6.2 5.7  NEUTROABS  --   --   --   --   --  4.3 3.3  HGB 10.5*   < > 8.9* 8.4* 9.7* 9.1* 9.9*  HCT 33.2*   < > 26.8* 25.7* 29.2* 28.0* 30.6*  MCV 98.2  --  94.0 94.5  --  94.9 97.1  PLT 161  --  138* 134*  --  142* 149*   < > = values in this interval not displayed.   Cardiac Enzymes: No results for input(s): CKTOTAL, CKMB, CKMBINDEX, TROPONINI in the last 168 hours. BNP: Invalid input(s): POCBNP CBG: Recent Labs  Lab 07/08/19 2028  GLUCAP 62*   D-Dimer No results for input(s): DDIMER in the last 72 hours. Hgb A1c No results for input(s): HGBA1C in the last 72 hours. Lipid Profile No results for input(s): CHOL, HDL, LDLCALC, TRIG, CHOLHDL, LDLDIRECT in the last 72 hours. Thyroid function studies No results for input(s): TSH, T4TOTAL, T3FREE, THYROIDAB in the last 72 hours.  Invalid input(s): FREET3 Anemia work up Recent Labs    07/10/19 0358  FERRITIN 200  TIBC 109*  IRON 29*   Urinalysis    Component Value Date/Time   COLORURINE YELLOW 07/06/2019 [place] 07/06/2019 2201   LABSPEC 1.034 (H) 07/06/2019 2201   PHURINE 5.0 07/06/2019 2201   GLUCOSEU NEGATIVE 07/06/2019 2201   GLUCOSEU NEGATIVE 05/26/2018 1716   HGBUR MODERATE (A) 07/06/2019 2201    BILIRUBINUR  NEGATIVE 07/06/2019 2201   KETONESUR 5 (A) 07/06/2019 2201   PROTEINUR NEGATIVE 07/06/2019 2201   UROBILINOGEN 0.2 05/26/2018 1716   NITRITE NEGATIVE 07/06/2019 2201   LEUKOCYTESUR NEGATIVE 07/06/2019 2201   Sepsis Labs Invalid input(s): PROCALCITONIN,  WBC,  LACTICIDVEN Microbiology Recent Results (from the past 240 hour(s))  Urine culture     Status: Abnormal   Collection Time: 07/06/19  9:56 PM   Specimen: Urine, Catheterized  Result Value Ref Range Status   Specimen Description URINE, CATHETERIZED  Final   Special Requests NONE  Final   Culture (A)  Final    <10,000 COLONIES/mL INSIGNIFICANT GROWTH Performed at [facility], [place] [address] [place], [place] [address]    Report Status 07/08/2019 FINAL  Final  SARS CORONAVIRUS 2 (TAT 6-24 HRS) Nasopharyngeal Nasopharyngeal Swab     Status: None   Collection Time: 07/06/19 11:26 PM   Specimen: Nasopharyngeal Swab  Result Value Ref Range Status   SARS Coronavirus 2 NEGATIVE NEGATIVE Final    Comment: (NOTE) SARS-CoV-2 target nucleic acids are NOT DETECTED. The SARS-CoV-2 RNA is generally detectable in upper and lower respiratory specimens during the acute phase of infection. Negative results do not preclude SARS-CoV-2 infection, do not rule out co-infections with other pathogens, and should not be used as the sole basis for treatment or other patient management decisions. Negative results must be combined with clinical observations, patient history, and epidemiological information. The expected result is Negative. Fact Sheet for Patients: SugarRoll.be Fact Sheet for Healthcare Providers: https://www.woods-mathews.com/ This test is not yet approved or cleared by the [place] FDA and  has been authorized for detection and/or diagnosis of SARS-CoV-2 by FDA under an Emergency Use Authorization (EUA). This EUA will remain  in effect (meaning this test can be  used) for the duration of the COVID-19 declaration under Section 56 4(b)(1) of the Act, 21 U.S.C. section 360bbb-3(b)(1), unless the authorization is terminated or revoked sooner. Performed at [facility], [place] [address], [place], [place] [address]   Blood culture (routine x 2)     Status: None (Preliminary result)   Collection Time: 07/07/19 12:05 AM   Specimen: BLOOD  Result Value Ref Range Status   Specimen Description BLOOD RIGHT ANTECUBITAL  Final   Special Requests   Final    BOTTLES DRAWN AEROBIC AND ANAEROBIC Blood Culture adequate volume   Culture   Final    NO GROWTH 3 DAYS Performed at [facility], [place] [address], [place], [place] [address]    Report Status PENDING  Incomplete  Blood culture (routine x 2)     Status: Abnormal   Collection Time: 07/07/19 12:10 AM   Specimen: BLOOD  Result Value Ref Range Status   Specimen Description BLOOD LEFT ANTECUBITAL  Final   Special Requests   Final    BOTTLES DRAWN AEROBIC AND ANAEROBIC Blood Culture results may not be optimal due to an inadequate volume of blood received in culture bottles   Culture  Setup Time   Final    AEROBIC BOTTLE ONLY GRAM POSITIVE COCCI CRITICAL RESULT CALLED TO, READ BACK BY AND VERIFIED WITH: V BRYK PHARMD 07/08/19 0456 JDW    Culture (A)  Final    STAPHYLOCOCCUS SPECIES (COAGULASE NEGATIVE) THE SIGNIFICANCE OF ISOLATING THIS ORGANISM FROM A SINGLE SET OF BLOOD CULTURES WHEN MULTIPLE SETS ARE DRAWN IS UNCERTAIN. PLEASE NOTIFY THE MICROBIOLOGY DEPARTMENT WITHIN ONE WEEK IF SPECIATION AND SENSITIVITIES ARE REQUIRED. Performed at [facility], [address], [place],  [address]    Report Status 07/10/2019 FINAL  Final     Time coordinating discharge: 35 minutes  SIGNED:   Jonnie Finner, DO  [facility] 07/11/2019, 9:38 AM   If 7PM-7AM, please contact night-coverage [email or web address]

## 2019-07-11 NOTE — TOC Transition Note (Signed)
Transition of Care [facility]) - CM/SW Discharge Note   Patient Details  Name: [name] Schlotterbeck Sr. MRN: [id number] Date of Birth: 11-Nov-1924  [facility] CM/SW Contact:  Pollie Friar, RN Phone Number: 07/11/2019, 12:44 PM   Clinical Narrative:    Pt discharging home with [facility] services through [facility]. Pt was already active with [facility] prior to admission. Butch Penny with [facility] aware of d/c.  Pt to transport home via [facility]. CM reached out to the patients daughter and she is good with d/c and will be at the home. Address verified and [facility] called. Bedside RN updated. D/c packet in his chart.    Final next level of care: [place] Barriers to Discharge: No Barriers Identified   Patient Goals and CMS Choice Patient states their goals for this hospitalization and ongoing recovery are:: to go home CMS Medicare.gov Compare Post Acute Care list provided to:: Patient Represenative (must comment) Choice offered to / list presented to : Adult Children  Discharge Placement                       Discharge Plan and Services     Post Acute Care Choice: Home Health                    HH Arranged: RN, PT, OT [facility] Agency: [place] ([place]) Date [place]: 07/11/19 Time [place]: [place] Representative spoke with at [place]: Butch Penny aware of dc/ home and new orders  Social Determinants of Health (SDOH) Interventions     Readmission Risk Interventions No flowsheet data found.

## 2019-07-11 NOTE — Progress Notes (Signed)
Patient discharge home via [place].  Daughter called and updated.

## 2019-07-12 ENCOUNTER — Telehealth: Payer: Self-pay

## 2019-07-12 ENCOUNTER — Telehealth: Payer: Self-pay | Admitting: *Deleted

## 2019-07-12 DIAGNOSIS — L89322 Pressure ulcer of left buttock, stage 2: Secondary | ICD-10-CM | POA: Diagnosis not present

## 2019-07-12 DIAGNOSIS — J9601 Acute respiratory failure with hypoxia: Secondary | ICD-10-CM | POA: Diagnosis not present

## 2019-07-12 DIAGNOSIS — J441 Chronic obstructive pulmonary disease with (acute) exacerbation: Secondary | ICD-10-CM | POA: Diagnosis not present

## 2019-07-12 DIAGNOSIS — R1312 Dysphagia, oropharyngeal phase: Secondary | ICD-10-CM | POA: Diagnosis not present

## 2019-07-12 DIAGNOSIS — I959 Hypotension, unspecified: Secondary | ICD-10-CM | POA: Diagnosis not present

## 2019-07-12 DIAGNOSIS — L89152 Pressure ulcer of sacral region, stage 2: Secondary | ICD-10-CM | POA: Diagnosis not present

## 2019-07-12 DIAGNOSIS — Z4800 Encounter for change or removal of nonsurgical wound dressing: Secondary | ICD-10-CM | POA: Diagnosis not present

## 2019-07-12 DIAGNOSIS — N179 Acute kidney failure, unspecified: Secondary | ICD-10-CM | POA: Diagnosis not present

## 2019-07-12 DIAGNOSIS — R001 Bradycardia, unspecified: Secondary | ICD-10-CM | POA: Diagnosis not present

## 2019-07-12 DIAGNOSIS — I129 Hypertensive chronic kidney disease with stage 1 through stage 4 chronic kidney disease, or unspecified chronic kidney disease: Secondary | ICD-10-CM | POA: Diagnosis not present

## 2019-07-12 DIAGNOSIS — N189 Chronic kidney disease, unspecified: Secondary | ICD-10-CM | POA: Diagnosis not present

## 2019-07-12 LAB — CULTURE, BLOOD (ROUTINE X 2)
Culture: NO GROWTH
Special Requests: ADEQUATE

## 2019-07-12 NOTE — Telephone Encounter (Signed)
Please give the order.  Thanks.   

## 2019-07-12 NOTE — Telephone Encounter (Signed)
Noted. Thanks.

## 2019-07-12 NOTE — Telephone Encounter (Signed)
Transition Care Management Follow-up Telephone Call  Date of discharge and from where: 07/11/2019[facility]  How have you been since you were released from the hospital? Patient is doing much better. Resting at home and home health is providing services.   Any questions or concerns? No   Items Reviewed:  Did the pt receive and understand the discharge instructions provided? Yes   Medications obtained and verified? Yes   Any new allergies since your discharge? No   Dietary orders reviewed? Yes  Do you have support at home? Yes   Functional Questionnaire: (I = Independent and D = Dependent) ADLs: D  Bathing/Dressing- D  Meal Prep- D  Eating- I  Maintaining continence- I  Transferring/Ambulation- D  Managing Meds- D  Follow up appointments reviewed:   PCP Hospital f/u appt confirmed? Yes  Scheduled to see Dr. Damita Dunnings on 07/18/2019 @ 2:15 pm virtual.  [facility] f/u appt confirmed? N/A   Are transportation arrangements needed? No   If their condition worsens, is the pt aware to call PCP or go to the Emergency Dept.? Yes  Was the patient provided with contact information for the PCP's office or ED? Yes  Was to pt encouraged to call back with questions or concerns? Yes

## 2019-07-12 NOTE — Telephone Encounter (Signed)
Left message on voicemail to return my call- need to complete TCM and schedule follow up visit 

## 2019-07-12 NOTE — Telephone Encounter (Signed)
Tina nurse with [facility] called stating that patient is home from the hospital and they are resuming care today. Otila Kluver is requesting a verbal okay to continue their services.

## 2019-07-13 ENCOUNTER — Encounter: Payer: Self-pay | Admitting: Family Medicine

## 2019-07-13 NOTE — Telephone Encounter (Signed)
[name] [name] with [facility] advised.

## 2019-07-14 ENCOUNTER — Other Ambulatory Visit: Payer: Self-pay

## 2019-07-14 ENCOUNTER — Encounter: Payer: Self-pay | Admitting: Adult Health

## 2019-07-14 ENCOUNTER — Ambulatory Visit (INDEPENDENT_AMBULATORY_CARE_PROVIDER_SITE_OTHER): Payer: Medicare Other | Admitting: Adult Health

## 2019-07-14 DIAGNOSIS — J439 Emphysema, unspecified: Secondary | ICD-10-CM | POA: Diagnosis not present

## 2019-07-14 DIAGNOSIS — J449 Chronic obstructive pulmonary disease, unspecified: Secondary | ICD-10-CM

## 2019-07-14 DIAGNOSIS — J441 Chronic obstructive pulmonary disease with (acute) exacerbation: Secondary | ICD-10-CM

## 2019-07-14 DIAGNOSIS — J189 Pneumonia, unspecified organism: Secondary | ICD-10-CM

## 2019-07-14 DIAGNOSIS — I5032 Chronic diastolic (congestive) heart failure: Secondary | ICD-10-CM

## 2019-07-14 DIAGNOSIS — U071 COVID-19: Secondary | ICD-10-CM | POA: Diagnosis not present

## 2019-07-14 NOTE — Progress Notes (Signed)
Virtual Visit via Telephone Note  I connected with [name] Seller Sr. on 07/14/19 at 11:45 AM EST by telephone and verified that I am speaking with the correct person using two identifiers.  Location: Patient: Home /with Daughter  Provider: Office    I discussed the limitations, risks, security and privacy concerns of performing an evaluation and management service by telephone and the availability of in person appointments. I also discussed with the patient that there may be a patient responsible charge related to this service. The patient expressed understanding and agreed to proceed.   History of Present Illness: 86 year old male former smoker followed for emphysema/COPD, diastolic heart failure, prone to recurrent aspiration   Today's televisit is a post hospital follow up .  Patient has had a difficult time recently with multiple hospitalizations.  He has underlying COPD and is prone to recurrent exacerbations.  Longstanding history of recurrent aspiration with known Pseudomonas colonization.  Has been on a maintenance regimen with Trelegy daily.  Flutter valve, albuterol nebulizers and was on daily azithromycin.   Patient was admitted to [facility] December 30 through January 1 with severe bradycardia and second-degree AV block he required a pacemaker.  Shortly after discharge patient developed acute respiratory distress and was readmitted.  Patient was found to be Covid positive and was treated with IV remdesivir and Decadron.  He was also treated for pneumonia.  Shortly after discharge patient was readmitted to [facility] with abdominal pain and constipation.  Patient was treated with supportive care recommend to use laxatives and suppositories.  CT abdomen showed no evidence of obstruction but did show large amount of stool in the colon. He was discharged home earlier this week with home health care.  He is currently with his daughter [name] [name] today.  Most of the information today  comes from her.  He is sitting up in the chair says that he feels very weak but is very glad to be home.  Daughter says since he got home he is slowly getting better but he is still extremely weak.  Had had some confusion but this is much better since getting home.  Says he is drinking very well.  Is had no episodes of choking or severe coughing.  Have been adding in some Ensure and protein puddings.  Says that he ate well today with an fit was able to feed himself a whole cup of oatmeal.  He has lost his taste with Covid which seems to be affecting his appetite. Physical therapy is supposed to start back up next week.  Home health is working with him currently now. Patient is currently on Trelegy flutter valve and albuterol nebulizers.  During first admission after severe bradycardia his azithromycin was stopped.  He remains off of this at this time.  Cough and congestion have been at baseline.    Observations/Objective:  Synopsis: former patient of Dr. Odette Fraction  Smoked cigarettes for many years, from age 48 to age 86 1.5 ppd CXR/CT chest 2014: Emphysematous changes with mild scarring, +dilated esophagus with A/F level.  PFT"s 08/2012: Normal FEV1%, but FVC improved 17% with BD. +++ airtrapping/ +FVL, no restriction, DLCO normal  Echo 09/2012: Normal LV EF, diastolic dysfxn, mildly dilated LA, RV normal.  CT angio 09/2012: No PE, small nodule posterior LUL, GG anterior lingula ?significance, scattered areas of atx. No significant emphysematous changes or ISLD 07/2015 Pseudomonas (pan sensitive )  Sputum cx and AFB neg on 12/23/15 . Swallow eval October 2019 moderate oral and severe  pharyngeal dysphagia resulting aspiration of thin, nectar, honey consistencies Chest x-ray May 2020 right basilar scarring no acute abnormality noted  Assessment and Plan: Severe COPD with recurrent exacerbations and is also prone to recurrent aspiration-patient is recovering from COVID-19 and  pneumonia. During admission CT chest showed significant opacification of the bronchus intermedius and volume loss of the right lower lobe.  Patient has completed antibiotic therapy.  Is also completed treatment course for COVID-19.  He seems to be slowly clinically recovering.  We will follow up a chest x-ray in 4 to 6 weeks on return visit. For now we will continue off of azithromycin.-He has been on this since March 2017.  We will continue to monitor off for now.  -Dysphagia prone to aspiration.  Aspiration precautions discussed extensively. Continue on dysphagia diet  -Severe deconditioning-physical therapy would be of great benefit  -Severe bradycardia and second-degree AV block status post pacemaker-continue follow-up with cardiology  -COVID-19 infection-appears to be clinically improved-out of isolation  -Protein calorie malnutrition-advised on a high-protein diet  Plan  Patient Instructions  Continue Trelegy 1 puff daily Remain off Azithromycin  Use albuterol as needed for chest tightness wheezing or shortness of breath, Advance activity as tolerated.  PT as discussed.  Use Flutter valve Three times a day   Aspiration precautions Follow up with Dr. Valeta Harms in 6 weeks with chest xray and As needed   Please contact office for sooner follow up if symptoms do not improve or worsen or seek emergency care        Follow Up Instructions: Follow-up in 6 weeks with chest x-ray and as needed  Please contact office for sooner follow up if symptoms do not improve or worsen or seek emergency care     I discussed the assessment and treatment plan with the patient. The patient was provided an opportunity to ask questions and all were answered. The patient agreed with the plan and demonstrated an understanding of the instructions.   The patient was advised to call back or seek an in-person evaluation if the symptoms worsen or if the condition fails to improve as anticipated.  I provided  26  minutes of non-face-to-face time during this encounter.   Rexene Edison, NP

## 2019-07-14 NOTE — Patient Instructions (Addendum)
Continue Trelegy 1 puff daily Remain off Azithromycin  Use albuterol as needed for chest tightness wheezing or shortness of breath, Advance activity as tolerated.  PT as discussed.  Use Flutter valve Three times a day   Aspiration precautions Follow up with Dr. Valeta Harms in 6 weeks with chest xray and As needed   Please contact office for sooner follow up if symptoms do not improve or worsen or seek emergency care

## 2019-07-15 ENCOUNTER — Encounter: Payer: Self-pay | Admitting: Family Medicine

## 2019-07-15 DIAGNOSIS — L89322 Pressure ulcer of left buttock, stage 2: Secondary | ICD-10-CM | POA: Diagnosis not present

## 2019-07-15 DIAGNOSIS — N179 Acute kidney failure, unspecified: Secondary | ICD-10-CM | POA: Diagnosis not present

## 2019-07-15 DIAGNOSIS — I959 Hypotension, unspecified: Secondary | ICD-10-CM | POA: Diagnosis not present

## 2019-07-15 DIAGNOSIS — L89152 Pressure ulcer of sacral region, stage 2: Secondary | ICD-10-CM | POA: Diagnosis not present

## 2019-07-15 DIAGNOSIS — J9601 Acute respiratory failure with hypoxia: Secondary | ICD-10-CM | POA: Diagnosis not present

## 2019-07-15 DIAGNOSIS — Z4800 Encounter for change or removal of nonsurgical wound dressing: Secondary | ICD-10-CM | POA: Diagnosis not present

## 2019-07-15 DIAGNOSIS — J441 Chronic obstructive pulmonary disease with (acute) exacerbation: Secondary | ICD-10-CM | POA: Diagnosis not present

## 2019-07-15 DIAGNOSIS — R1312 Dysphagia, oropharyngeal phase: Secondary | ICD-10-CM | POA: Diagnosis not present

## 2019-07-15 DIAGNOSIS — R001 Bradycardia, unspecified: Secondary | ICD-10-CM | POA: Diagnosis not present

## 2019-07-15 DIAGNOSIS — I129 Hypertensive chronic kidney disease with stage 1 through stage 4 chronic kidney disease, or unspecified chronic kidney disease: Secondary | ICD-10-CM | POA: Diagnosis not present

## 2019-07-15 DIAGNOSIS — N189 Chronic kidney disease, unspecified: Secondary | ICD-10-CM | POA: Diagnosis not present

## 2019-07-18 ENCOUNTER — Ambulatory Visit (INDEPENDENT_AMBULATORY_CARE_PROVIDER_SITE_OTHER): Payer: Medicare Other | Admitting: Family Medicine

## 2019-07-18 DIAGNOSIS — Z4800 Encounter for change or removal of nonsurgical wound dressing: Secondary | ICD-10-CM | POA: Diagnosis not present

## 2019-07-18 DIAGNOSIS — I959 Hypotension, unspecified: Secondary | ICD-10-CM | POA: Diagnosis not present

## 2019-07-18 DIAGNOSIS — L89152 Pressure ulcer of sacral region, stage 2: Secondary | ICD-10-CM | POA: Diagnosis not present

## 2019-07-18 DIAGNOSIS — N189 Chronic kidney disease, unspecified: Secondary | ICD-10-CM | POA: Diagnosis not present

## 2019-07-18 DIAGNOSIS — R5381 Other malaise: Secondary | ICD-10-CM | POA: Diagnosis not present

## 2019-07-18 DIAGNOSIS — J439 Emphysema, unspecified: Secondary | ICD-10-CM

## 2019-07-18 DIAGNOSIS — L89322 Pressure ulcer of left buttock, stage 2: Secondary | ICD-10-CM | POA: Diagnosis not present

## 2019-07-18 DIAGNOSIS — J441 Chronic obstructive pulmonary disease with (acute) exacerbation: Secondary | ICD-10-CM | POA: Diagnosis not present

## 2019-07-18 DIAGNOSIS — I10 Essential (primary) hypertension: Secondary | ICD-10-CM | POA: Diagnosis not present

## 2019-07-18 DIAGNOSIS — R001 Bradycardia, unspecified: Secondary | ICD-10-CM | POA: Diagnosis not present

## 2019-07-18 DIAGNOSIS — R1312 Dysphagia, oropharyngeal phase: Secondary | ICD-10-CM | POA: Diagnosis not present

## 2019-07-18 DIAGNOSIS — N179 Acute kidney failure, unspecified: Secondary | ICD-10-CM | POA: Diagnosis not present

## 2019-07-18 DIAGNOSIS — I129 Hypertensive chronic kidney disease with stage 1 through stage 4 chronic kidney disease, or unspecified chronic kidney disease: Secondary | ICD-10-CM | POA: Diagnosis not present

## 2019-07-18 DIAGNOSIS — J9601 Acute respiratory failure with hypoxia: Secondary | ICD-10-CM | POA: Diagnosis not present

## 2019-07-18 MED ORDER — HYDRALAZINE HCL 50 MG PO TABS: 25.0000 mg | ORAL_TABLET | Freq: Three times a day (TID) | ORAL | Status: DC | PRN

## 2019-07-18 MED ORDER — GUAIFENESIN 100 MG/5ML PO LIQD: 200.0000 mg | Freq: Two times a day (BID) | ORAL | Status: AC

## 2019-07-18 MED ORDER — LEVOTHYROXINE SODIUM 100 MCG PO TABS: 100.0000 ug | ORAL_TABLET | Freq: Every day | ORAL | Status: AC

## 2019-07-18 MED ORDER — OMEPRAZOLE 40 MG PO CPDR: DELAYED_RELEASE_CAPSULE | ORAL | Status: AC

## 2019-07-18 NOTE — Progress Notes (Signed)
Virtual visit completed through WebEx or similar program Patient location: home  Provider location: Financial controller at [facility], office   Pandemic considerations d/w pt.   Limitations and rationale for visit method d/w patient.  Patient agreed to proceed.   CC: f/u  HPI: ========================== Admit date: 07/06/2019 Discharge date: 07/11/2019  Admitted From: Home Disposition:  Discharged to home.   Recommendations for Outpatient Follow-up:  1. Follow up with PCP in 1 weeks 2. Please obtain BMP/CBC in one week  Discharge Condition: Stable  CODE STATUS: DNR   Brief/Interim Summary: 86 year old male with history of severe COPD, chronic diastolic CHF, chronic dysphagia, history of recurrent pneumonia was admitted to [facility] from 12/30-1/1 with severe bradycardia and second-degree AV block's, he [facility]. - In the emergency rot that time, subsequently readmitted to [facility] from 1/3-1/15 with acute hypoxic respiratory failure he was diagnosed with Covid on 1/4 and treated for this with IV remdesivir and Decadron, he was also treated for community-acquired pneumonia. - He was discharged home a week ago from [facility] and presented to our emergency room with decreased urine output, constipation, weakness, per daughter they recommom labs were unremarkable, vital signs were stable, CT abdomen pelvis concerning for constipation, CT angiogram chest noted opacification of bronchus intermedius on the right with subtotal collapse/infiltrate in the right lower lobe.  07/11/19:More interactive today. Spoke with dtr this morning. She believes that he is close to baseline and this confusion always happens in the hospital. K+ is a little low today. Will send home with supplement for 5 days. Otherwise, labs are good. He's stable for d/c to home.   Discharge Diagnoses:  Principal Problem:   HCAP (healthcare-associated pneumonia) Active Problems:   COPD  (chronic obstructive pulmonary disease) with emphysema (HCC)   HTN (hypertension)   Pneumonia   Hypothyroidism   Chronic diastolic heart failure (HCC)   Pressure injury of skin  Aspiration pneumonia RLL Collapse Dysphagia - Longstanding history of dysphagia for few years, likely worsened now - SLP following, n.p.o. recommended yesterday, they will follow and reassess - continue gentle IV fluids - Continue IV Unasyn; end 07/11/19 - with regards to right lower lobe lung collapse patient is too frail for bronchoscopy, continue flutter valve and hypertonic saline nebs, d/w pulmonary Dr.McQuaid yesterday - SLP recs dys 1 diet  Encephalopathy, etiology unknown - hold pm seroquel - UA yesterday was unremarkable -07/10/19: more effort today to get him engaged; don't have a clear etiology here     - 07/11/19: engaged today; spoke with dtr this AM, she feels he is about at baseline  Constipation - for 1 week per daughter - CT with evidence of large amount of stool - continue laxatives, suppository - Enema if needed  Recent Covid - Positive for Covid on 1/4-hospitalized at [place], treated with IV remdesivir and Decadron, will complete 21-day isolation on 1/25 - 07/09/19: Testing is negative - 07/10/19: off isolation today  Chronic diastolic CHF - Hold torsemide, continue hydralazine at lower dose - 07/09/19: Scr is improving; monitor - 07/10/19: baseline is 0.9-1; resolved  COPD - stable, continue albuterol HFA as needed  Hypothyroidism - continue Synthroid  Hypokalemia Hypophosphatemia -Add K-phos     - phos is resolved     - will send home with 5 days daily K+; follow up oupt  Skin cancer/neck lesion - Follow-up with dermatology  Normocytic anemia - had some bleeding from a scab he picked last night - has history of Fe  deficiency - check iron level;  monitor - 07/10/19: Fe deficient; get nulecit     - 07/11/19: s/p iron infusion. Hgb is good; no signs of bleed.   Debility - PT recs HHPT     - OT recs HHOT  ==========================  Recent inpatient course discussed with patient and daughter.  Patient is able to participate in portions of the conversation.  Most of the history and conversation is with his family.  Inpatient treatment for bradycardia with pacer placement noted.  Subsequently developed Covid and required readmission.  Recent admission for pneumonia discussed.  Specifically discussed aspiration risk and previous pulmonary status.  He is likely too frail for invasive treatment such as bronchoscopy (daughter agreed with this assessment) and it was deemed reasonable for discharge to home with outpatient follow-up and supportive care in the meantime.  His swallowing was worse when he was sick but that seems to be some better in the meantime.  Family is careful in helping him with aspiration precautions.  He is having normal bowel movements now and while he is not mobile he does seem to be getting some stronger per report.  He has been eating more in recent days.  Recent breakfast was oatmeal, taking a regular supper and also taking protein shakes intermittently.  He has no fevers or chills or nausea or vomiting.  He is off torsemide now.  He has home health coming in and he has family support at home.  He has had variable blood pressures and we talked about using hydralazine 25 mg if his blood pressure is above 140/above 90.  Torsemide has been held in the meantime.  It is reasonable to continue to hold this if he is not having swelling.  Discussed.  He is finishing his potassium course and needs follow-up labs.  He had pulmonary follow-up and has cardiology follow-up pending.  Family has held Seroquel in the meantime since he was sleeping well and was a little fatigued and not agitated.  Discussed.  I think this is  reasonable.  Meds and allergies reviewed.   ROS: Per HPI unless specifically indicated in ROS section   NAD Speech wnl  A/P:  Bradycardia status post pacer placement with cardiology follow-up pending.  We will get follow-up labs through home health.  COPD/history of Covid/pneumonia too frail for bronchoscopy with recent pulmonary follow-up done.  Off antibiotics currently.  Continue supportive care for now.  Aspiration cautions discussed with family.  Held seroquel in the meantime since sleeping well and still fatigued and not agitated.    Hypertension would continue to hold torsemide if not having swelling.  Hydralazine 25mg  to be used if BP is >140/>90.  Will finish potassium and needs f/u labs in the near future.  We will get this set up through home health.  Debility/deconditioning.  Family has some help at home, in addition to [facility].  Overall would continue as is for now.  The patient is frail but appears to be making slow progress and is still appropriate for outpatient follow-up right now given his situation.  Will await follow-up labs.  At least 30 minutes were devoted to patient care in this encounter (this can potentially include time spent reviewing the patient's file/history, interviewing and examining the patient, counseling/reviewing plan with patient, ordering referrals, ordering tests, reviewing relevant laboratory or x-ray data, and documenting the encounter).

## 2019-07-18 NOTE — Progress Notes (Signed)
PCCM: thanks for seeing him. I agree. Need to follow up the abnormality seen on chest imaging.  Glad to est with him in a few weeks Garner Nash, DO [facility] 07/18/2019 5:01 PM

## 2019-07-19 DIAGNOSIS — I129 Hypertensive chronic kidney disease with stage 1 through stage 4 chronic kidney disease, or unspecified chronic kidney disease: Secondary | ICD-10-CM | POA: Diagnosis not present

## 2019-07-19 DIAGNOSIS — L89152 Pressure ulcer of sacral region, stage 2: Secondary | ICD-10-CM | POA: Diagnosis not present

## 2019-07-19 DIAGNOSIS — N179 Acute kidney failure, unspecified: Secondary | ICD-10-CM | POA: Diagnosis not present

## 2019-07-19 DIAGNOSIS — R001 Bradycardia, unspecified: Secondary | ICD-10-CM | POA: Diagnosis not present

## 2019-07-19 DIAGNOSIS — J9601 Acute respiratory failure with hypoxia: Secondary | ICD-10-CM | POA: Diagnosis not present

## 2019-07-19 DIAGNOSIS — J441 Chronic obstructive pulmonary disease with (acute) exacerbation: Secondary | ICD-10-CM | POA: Diagnosis not present

## 2019-07-19 DIAGNOSIS — R1312 Dysphagia, oropharyngeal phase: Secondary | ICD-10-CM | POA: Diagnosis not present

## 2019-07-19 DIAGNOSIS — I959 Hypotension, unspecified: Secondary | ICD-10-CM | POA: Diagnosis not present

## 2019-07-19 DIAGNOSIS — N189 Chronic kidney disease, unspecified: Secondary | ICD-10-CM | POA: Diagnosis not present

## 2019-07-19 DIAGNOSIS — Z4800 Encounter for change or removal of nonsurgical wound dressing: Secondary | ICD-10-CM | POA: Diagnosis not present

## 2019-07-19 DIAGNOSIS — L89322 Pressure ulcer of left buttock, stage 2: Secondary | ICD-10-CM | POA: Diagnosis not present

## 2019-07-20 DIAGNOSIS — R1312 Dysphagia, oropharyngeal phase: Secondary | ICD-10-CM | POA: Diagnosis not present

## 2019-07-20 DIAGNOSIS — R001 Bradycardia, unspecified: Secondary | ICD-10-CM | POA: Diagnosis not present

## 2019-07-20 DIAGNOSIS — R410 Disorientation, unspecified: Secondary | ICD-10-CM | POA: Diagnosis not present

## 2019-07-20 DIAGNOSIS — N179 Acute kidney failure, unspecified: Secondary | ICD-10-CM | POA: Diagnosis not present

## 2019-07-20 DIAGNOSIS — N189 Chronic kidney disease, unspecified: Secondary | ICD-10-CM | POA: Diagnosis not present

## 2019-07-20 DIAGNOSIS — L89322 Pressure ulcer of left buttock, stage 2: Secondary | ICD-10-CM | POA: Diagnosis not present

## 2019-07-20 DIAGNOSIS — Z4800 Encounter for change or removal of nonsurgical wound dressing: Secondary | ICD-10-CM | POA: Diagnosis not present

## 2019-07-20 DIAGNOSIS — J9601 Acute respiratory failure with hypoxia: Secondary | ICD-10-CM | POA: Diagnosis not present

## 2019-07-20 DIAGNOSIS — E43 Unspecified severe protein-calorie malnutrition: Secondary | ICD-10-CM | POA: Diagnosis not present

## 2019-07-20 DIAGNOSIS — J441 Chronic obstructive pulmonary disease with (acute) exacerbation: Secondary | ICD-10-CM | POA: Diagnosis not present

## 2019-07-20 DIAGNOSIS — I959 Hypotension, unspecified: Secondary | ICD-10-CM | POA: Diagnosis not present

## 2019-07-20 DIAGNOSIS — I129 Hypertensive chronic kidney disease with stage 1 through stage 4 chronic kidney disease, or unspecified chronic kidney disease: Secondary | ICD-10-CM | POA: Diagnosis not present

## 2019-07-20 DIAGNOSIS — L89152 Pressure ulcer of sacral region, stage 2: Secondary | ICD-10-CM | POA: Diagnosis not present

## 2019-07-20 DIAGNOSIS — G9340 Encephalopathy, unspecified: Secondary | ICD-10-CM | POA: Diagnosis not present

## 2019-07-20 NOTE — Telephone Encounter (Signed)
See office visit note

## 2019-07-21 ENCOUNTER — Telehealth: Payer: Self-pay | Admitting: Family Medicine

## 2019-07-21 ENCOUNTER — Encounter: Payer: Self-pay | Admitting: Family Medicine

## 2019-07-21 DIAGNOSIS — R001 Bradycardia, unspecified: Secondary | ICD-10-CM | POA: Diagnosis not present

## 2019-07-21 DIAGNOSIS — I129 Hypertensive chronic kidney disease with stage 1 through stage 4 chronic kidney disease, or unspecified chronic kidney disease: Secondary | ICD-10-CM | POA: Diagnosis not present

## 2019-07-21 DIAGNOSIS — I959 Hypotension, unspecified: Secondary | ICD-10-CM | POA: Diagnosis not present

## 2019-07-21 DIAGNOSIS — R1312 Dysphagia, oropharyngeal phase: Secondary | ICD-10-CM | POA: Diagnosis not present

## 2019-07-21 DIAGNOSIS — Z4800 Encounter for change or removal of nonsurgical wound dressing: Secondary | ICD-10-CM | POA: Diagnosis not present

## 2019-07-21 DIAGNOSIS — J441 Chronic obstructive pulmonary disease with (acute) exacerbation: Secondary | ICD-10-CM | POA: Diagnosis not present

## 2019-07-21 DIAGNOSIS — N179 Acute kidney failure, unspecified: Secondary | ICD-10-CM | POA: Diagnosis not present

## 2019-07-21 DIAGNOSIS — L89322 Pressure ulcer of left buttock, stage 2: Secondary | ICD-10-CM | POA: Diagnosis not present

## 2019-07-21 DIAGNOSIS — L89152 Pressure ulcer of sacral region, stage 2: Secondary | ICD-10-CM | POA: Diagnosis not present

## 2019-07-21 DIAGNOSIS — N189 Chronic kidney disease, unspecified: Secondary | ICD-10-CM | POA: Diagnosis not present

## 2019-07-21 DIAGNOSIS — J9601 Acute respiratory failure with hypoxia: Secondary | ICD-10-CM | POA: Diagnosis not present

## 2019-07-21 NOTE — Telephone Encounter (Signed)
[facility] contact [facility].  Please give them order for c-Met and CBC to be drawn.  They should be able to do this at home for the patient.  If they cannot do so then please let me know.  [email or web address].

## 2019-07-21 NOTE — Telephone Encounter (Signed)
Order faxed to [facility].

## 2019-07-23 DIAGNOSIS — R5381 Other malaise: Secondary | ICD-10-CM | POA: Insufficient documentation

## 2019-07-23 DIAGNOSIS — R001 Bradycardia, unspecified: Secondary | ICD-10-CM | POA: Insufficient documentation

## 2019-07-23 NOTE — Assessment & Plan Note (Signed)
Debility/deconditioning.  Family has some help at home, in addition to [facility].  Overall would continue as is for now.  The patient is frail but appears to be making slow progress and is still appropriate for outpatient follow-up right now given his situation.  Will await follow-up labs.

## 2019-07-23 NOTE — Assessment & Plan Note (Signed)
COPD/history of Covid/pneumonia too frail for bronchoscopy with recent pulmonary follow-up done.  Off antibiotics currently.  Continue supportive care for now.  Aspiration cautions discussed with family.  Held seroquel in the meantime since sleeping well and still fatigued and not agitated.

## 2019-07-23 NOTE — Assessment & Plan Note (Signed)
Hypertension would continue to hold torsemide if not having swelling.  Hydralazine 25mg  to be used if BP is >140/>90.  Will finish potassium and needs f/u labs in the near future.  We will get this set up through home health.

## 2019-07-23 NOTE — Assessment & Plan Note (Signed)
Bradycardia status post pacer placement with cardiology follow-up pending.  We will get follow-up labs through home health.

## 2019-07-25 ENCOUNTER — Telehealth: Payer: Self-pay | Admitting: Family Medicine

## 2019-07-25 DIAGNOSIS — R1312 Dysphagia, oropharyngeal phase: Secondary | ICD-10-CM | POA: Diagnosis not present

## 2019-07-25 DIAGNOSIS — N189 Chronic kidney disease, unspecified: Secondary | ICD-10-CM | POA: Diagnosis not present

## 2019-07-25 DIAGNOSIS — L89152 Pressure ulcer of sacral region, stage 2: Secondary | ICD-10-CM | POA: Diagnosis not present

## 2019-07-25 DIAGNOSIS — I129 Hypertensive chronic kidney disease with stage 1 through stage 4 chronic kidney disease, or unspecified chronic kidney disease: Secondary | ICD-10-CM | POA: Diagnosis not present

## 2019-07-25 DIAGNOSIS — N179 Acute kidney failure, unspecified: Secondary | ICD-10-CM | POA: Diagnosis not present

## 2019-07-25 DIAGNOSIS — I959 Hypotension, unspecified: Secondary | ICD-10-CM | POA: Diagnosis not present

## 2019-07-25 DIAGNOSIS — J441 Chronic obstructive pulmonary disease with (acute) exacerbation: Secondary | ICD-10-CM | POA: Diagnosis not present

## 2019-07-25 DIAGNOSIS — L89322 Pressure ulcer of left buttock, stage 2: Secondary | ICD-10-CM | POA: Diagnosis not present

## 2019-07-25 DIAGNOSIS — J9601 Acute respiratory failure with hypoxia: Secondary | ICD-10-CM | POA: Diagnosis not present

## 2019-07-25 DIAGNOSIS — R001 Bradycardia, unspecified: Secondary | ICD-10-CM | POA: Diagnosis not present

## 2019-07-25 DIAGNOSIS — Z4800 Encounter for change or removal of nonsurgical wound dressing: Secondary | ICD-10-CM | POA: Diagnosis not present

## 2019-07-25 NOTE — Telephone Encounter (Signed)
Left detailed message on voicemail of Anderson Malta.

## 2019-07-25 NOTE — Telephone Encounter (Signed)
Left message on voicemail for Anderson Malta to call the office back.

## 2019-07-25 NOTE — Telephone Encounter (Signed)
Is family aware?

## 2019-07-25 NOTE — Telephone Encounter (Signed)
Anderson Malta with [facility] called today She wanted to let you know that they do not offer [facility] in [place] at this time.    Jennifer's Call back- [phone]

## 2019-07-26 ENCOUNTER — Telehealth: Payer: Self-pay

## 2019-07-26 DIAGNOSIS — L89322 Pressure ulcer of left buttock, stage 2: Secondary | ICD-10-CM | POA: Diagnosis not present

## 2019-07-26 DIAGNOSIS — Z4800 Encounter for change or removal of nonsurgical wound dressing: Secondary | ICD-10-CM | POA: Diagnosis not present

## 2019-07-26 DIAGNOSIS — J9601 Acute respiratory failure with hypoxia: Secondary | ICD-10-CM | POA: Diagnosis not present

## 2019-07-26 DIAGNOSIS — R1312 Dysphagia, oropharyngeal phase: Secondary | ICD-10-CM | POA: Diagnosis not present

## 2019-07-26 DIAGNOSIS — R001 Bradycardia, unspecified: Secondary | ICD-10-CM | POA: Diagnosis not present

## 2019-07-26 DIAGNOSIS — N179 Acute kidney failure, unspecified: Secondary | ICD-10-CM | POA: Diagnosis not present

## 2019-07-26 DIAGNOSIS — L89152 Pressure ulcer of sacral region, stage 2: Secondary | ICD-10-CM | POA: Diagnosis not present

## 2019-07-26 DIAGNOSIS — N189 Chronic kidney disease, unspecified: Secondary | ICD-10-CM | POA: Diagnosis not present

## 2019-07-26 DIAGNOSIS — J441 Chronic obstructive pulmonary disease with (acute) exacerbation: Secondary | ICD-10-CM | POA: Diagnosis not present

## 2019-07-26 DIAGNOSIS — I959 Hypotension, unspecified: Secondary | ICD-10-CM | POA: Diagnosis not present

## 2019-07-26 DIAGNOSIS — I129 Hypertensive chronic kidney disease with stage 1 through stage 4 chronic kidney disease, or unspecified chronic kidney disease: Secondary | ICD-10-CM | POA: Diagnosis not present

## 2019-07-26 NOTE — Telephone Encounter (Signed)
Tina nurse with [facility] left v/m that pt has started a skin breakdown at lt gluteus maximus and request verbal order for placing duoderm on that. There is also another skin area below the crease line that Otila Kluver wants to apply zinc protective cream that [facility] already has. [place] request cb.

## 2019-07-26 NOTE — Telephone Encounter (Signed)
Please give the order.  Thanks.   

## 2019-07-27 DIAGNOSIS — L89322 Pressure ulcer of left buttock, stage 2: Secondary | ICD-10-CM | POA: Diagnosis not present

## 2019-07-27 DIAGNOSIS — I10 Essential (primary) hypertension: Secondary | ICD-10-CM | POA: Diagnosis not present

## 2019-07-27 DIAGNOSIS — N179 Acute kidney failure, unspecified: Secondary | ICD-10-CM | POA: Diagnosis not present

## 2019-07-27 DIAGNOSIS — R1312 Dysphagia, oropharyngeal phase: Secondary | ICD-10-CM | POA: Diagnosis not present

## 2019-07-27 DIAGNOSIS — N189 Chronic kidney disease, unspecified: Secondary | ICD-10-CM | POA: Diagnosis not present

## 2019-07-27 DIAGNOSIS — Z4800 Encounter for change or removal of nonsurgical wound dressing: Secondary | ICD-10-CM | POA: Diagnosis not present

## 2019-07-27 DIAGNOSIS — J9601 Acute respiratory failure with hypoxia: Secondary | ICD-10-CM | POA: Diagnosis not present

## 2019-07-27 DIAGNOSIS — L89152 Pressure ulcer of sacral region, stage 2: Secondary | ICD-10-CM | POA: Diagnosis not present

## 2019-07-27 DIAGNOSIS — J441 Chronic obstructive pulmonary disease with (acute) exacerbation: Secondary | ICD-10-CM | POA: Diagnosis not present

## 2019-07-27 DIAGNOSIS — R001 Bradycardia, unspecified: Secondary | ICD-10-CM | POA: Diagnosis not present

## 2019-07-27 DIAGNOSIS — I959 Hypotension, unspecified: Secondary | ICD-10-CM | POA: Diagnosis not present

## 2019-07-27 DIAGNOSIS — I129 Hypertensive chronic kidney disease with stage 1 through stage 4 chronic kidney disease, or unspecified chronic kidney disease: Secondary | ICD-10-CM | POA: Diagnosis not present

## 2019-07-27 LAB — CBC AND DIFFERENTIAL: Hemoglobin: 8.3 — AB (ref 13.5–17.5)

## 2019-07-27 LAB — BASIC METABOLIC PANEL: Glucose: 141

## 2019-07-27 NOTE — Telephone Encounter (Addendum)
Left message for Otila Kluver giving verbal orders to apply DuoDerm to skin breakdown at lt gluteus maximus and to use zinc protective cream on other skin area below crease line per Dr. Damita Dunnings.

## 2019-07-28 ENCOUNTER — Telehealth: Payer: Self-pay

## 2019-07-28 DIAGNOSIS — I959 Hypotension, unspecified: Secondary | ICD-10-CM | POA: Diagnosis not present

## 2019-07-28 DIAGNOSIS — L89152 Pressure ulcer of sacral region, stage 2: Secondary | ICD-10-CM | POA: Diagnosis not present

## 2019-07-28 DIAGNOSIS — R1312 Dysphagia, oropharyngeal phase: Secondary | ICD-10-CM | POA: Diagnosis not present

## 2019-07-28 DIAGNOSIS — Z4800 Encounter for change or removal of nonsurgical wound dressing: Secondary | ICD-10-CM | POA: Diagnosis not present

## 2019-07-28 DIAGNOSIS — I129 Hypertensive chronic kidney disease with stage 1 through stage 4 chronic kidney disease, or unspecified chronic kidney disease: Secondary | ICD-10-CM | POA: Diagnosis not present

## 2019-07-28 DIAGNOSIS — N179 Acute kidney failure, unspecified: Secondary | ICD-10-CM | POA: Diagnosis not present

## 2019-07-28 DIAGNOSIS — R001 Bradycardia, unspecified: Secondary | ICD-10-CM | POA: Diagnosis not present

## 2019-07-28 DIAGNOSIS — L89322 Pressure ulcer of left buttock, stage 2: Secondary | ICD-10-CM | POA: Diagnosis not present

## 2019-07-28 DIAGNOSIS — J9601 Acute respiratory failure with hypoxia: Secondary | ICD-10-CM | POA: Diagnosis not present

## 2019-07-28 DIAGNOSIS — N189 Chronic kidney disease, unspecified: Secondary | ICD-10-CM | POA: Diagnosis not present

## 2019-07-28 DIAGNOSIS — J441 Chronic obstructive pulmonary disease with (acute) exacerbation: Secondary | ICD-10-CM | POA: Diagnosis not present

## 2019-07-28 NOTE — Telephone Encounter (Signed)
Otila Kluver, nurse with [facility], called asking for verbal orders to continue seen patient for about 2 to 3 weeks more. Patient has some skin irritations that need to be looked at and help with healing. Tina's CB is [phone].  Can this be ok in Dr Josefine Class absence?

## 2019-07-28 NOTE — Telephone Encounter (Signed)
Ok to continue HH.

## 2019-07-31 DIAGNOSIS — Z4800 Encounter for change or removal of nonsurgical wound dressing: Secondary | ICD-10-CM | POA: Diagnosis not present

## 2019-07-31 DIAGNOSIS — I959 Hypotension, unspecified: Secondary | ICD-10-CM | POA: Diagnosis not present

## 2019-07-31 DIAGNOSIS — J9601 Acute respiratory failure with hypoxia: Secondary | ICD-10-CM | POA: Diagnosis not present

## 2019-07-31 DIAGNOSIS — J441 Chronic obstructive pulmonary disease with (acute) exacerbation: Secondary | ICD-10-CM | POA: Diagnosis not present

## 2019-07-31 DIAGNOSIS — L89152 Pressure ulcer of sacral region, stage 2: Secondary | ICD-10-CM | POA: Diagnosis not present

## 2019-07-31 DIAGNOSIS — N179 Acute kidney failure, unspecified: Secondary | ICD-10-CM | POA: Diagnosis not present

## 2019-07-31 DIAGNOSIS — I129 Hypertensive chronic kidney disease with stage 1 through stage 4 chronic kidney disease, or unspecified chronic kidney disease: Secondary | ICD-10-CM | POA: Diagnosis not present

## 2019-07-31 DIAGNOSIS — N189 Chronic kidney disease, unspecified: Secondary | ICD-10-CM | POA: Diagnosis not present

## 2019-07-31 DIAGNOSIS — R001 Bradycardia, unspecified: Secondary | ICD-10-CM | POA: Diagnosis not present

## 2019-07-31 DIAGNOSIS — J690 Pneumonitis due to inhalation of food and vomit: Secondary | ICD-10-CM | POA: Diagnosis not present

## 2019-07-31 DIAGNOSIS — L89322 Pressure ulcer of left buttock, stage 2: Secondary | ICD-10-CM | POA: Diagnosis not present

## 2019-07-31 DIAGNOSIS — R1312 Dysphagia, oropharyngeal phase: Secondary | ICD-10-CM | POA: Diagnosis not present

## 2019-07-31 NOTE — Telephone Encounter (Signed)
Verbal order given to [facility]. Otila Kluver verbalized understanding.

## 2019-08-01 ENCOUNTER — Telehealth: Payer: Self-pay | Admitting: Family Medicine

## 2019-08-01 DIAGNOSIS — R6889 Other general symptoms and signs: Secondary | ICD-10-CM | POA: Diagnosis not present

## 2019-08-01 DIAGNOSIS — C4441 Basal cell carcinoma of skin of scalp and neck: Secondary | ICD-10-CM | POA: Diagnosis not present

## 2019-08-01 DIAGNOSIS — D649 Anemia, unspecified: Secondary | ICD-10-CM

## 2019-08-01 NOTE — Telephone Encounter (Signed)
[facility] says the lab stated that the blood was hemolyzed and they could not get it to separate to complete the CMET.  Case Manager, Anderson Malta, said patient is a hard stick and there had been 3 different nurses there to try to draw his blood. Case Manager asks if the patient could possibly go to an outpatient drawing station or to our office.  I spoke with the patient's daughter who says the patient has to be transported by a service because she cannot get him into a vehicle so it would be an added expense to do that unless it could be blended with another appointment date which he has on March 4 with his cardiologist in [facility].  Please advise.   Daughter says patient's stools have been dark, really more greenish, but not black.  No blood has been seen in the urine.  Patient does bruise easily.

## 2019-08-01 NOTE — Telephone Encounter (Signed)
Daughter advised and will try to get her niece to come by for the IFOB and return it after completion.  [facility] will try again to draw blood to get CMET and Hgb.  [facility] does not have the capability to do a fingerstick Hgb.  If blood cannot be collected through [facility], perhaps Cardiology would do it at upcoming appointment but [facility]'s rule is to not accept orders from other MD's unless they are [facility] MD's.  Don't know if that is the case with this Cardiology office or not.

## 2019-08-01 NOTE — Telephone Encounter (Signed)
I would stop his aspirin.  Please have them pick up and drop off an IFOB.  Given the slow mail delivery, I wouldn't mail that.    I think it makes sense to get f/u labs done at cardiology.  It would be reasonable to attempt repeat home draw if possible- see if home health can do a fingerstick HGB.  Please let me know about that.  I put in the order for IFOB.  Thanks.

## 2019-08-01 NOTE — Telephone Encounter (Signed)
Please check in with family/patient.  I just got a copy of his labs.  His HGB is down to 8.3. Has he had any black stools or bleeding?  His HGB was prev variable but low, 8-10.    The lab report is also incomplete and I need a copy of the CMET.  Please request this.  Thanks.

## 2019-08-02 ENCOUNTER — Telehealth: Payer: Self-pay | Admitting: *Deleted

## 2019-08-02 DIAGNOSIS — R001 Bradycardia, unspecified: Secondary | ICD-10-CM | POA: Diagnosis not present

## 2019-08-02 DIAGNOSIS — N179 Acute kidney failure, unspecified: Secondary | ICD-10-CM | POA: Diagnosis not present

## 2019-08-02 DIAGNOSIS — N189 Chronic kidney disease, unspecified: Secondary | ICD-10-CM | POA: Diagnosis not present

## 2019-08-02 DIAGNOSIS — J441 Chronic obstructive pulmonary disease with (acute) exacerbation: Secondary | ICD-10-CM | POA: Diagnosis not present

## 2019-08-02 DIAGNOSIS — L89152 Pressure ulcer of sacral region, stage 2: Secondary | ICD-10-CM | POA: Diagnosis not present

## 2019-08-02 DIAGNOSIS — L89322 Pressure ulcer of left buttock, stage 2: Secondary | ICD-10-CM | POA: Diagnosis not present

## 2019-08-02 DIAGNOSIS — Z4800 Encounter for change or removal of nonsurgical wound dressing: Secondary | ICD-10-CM | POA: Diagnosis not present

## 2019-08-02 DIAGNOSIS — I959 Hypotension, unspecified: Secondary | ICD-10-CM | POA: Diagnosis not present

## 2019-08-02 DIAGNOSIS — R1312 Dysphagia, oropharyngeal phase: Secondary | ICD-10-CM | POA: Diagnosis not present

## 2019-08-02 DIAGNOSIS — J9601 Acute respiratory failure with hypoxia: Secondary | ICD-10-CM | POA: Diagnosis not present

## 2019-08-02 DIAGNOSIS — I129 Hypertensive chronic kidney disease with stage 1 through stage 4 chronic kidney disease, or unspecified chronic kidney disease: Secondary | ICD-10-CM | POA: Diagnosis not present

## 2019-08-02 NOTE — Telephone Encounter (Signed)
I appreciate home health trying to get the blood collected again.  I will await that and Ifob.  If he has a significant change in the meantime then let me know.  If he cannot get the blood collected through home health then we may end up needing to request follow-up labs through cardiology.  This is an atypical situation (where it is difficult for him to come to clinic) and I appreciate the help of all involved.

## 2019-08-02 NOTE — Telephone Encounter (Signed)
Verbal order given  

## 2019-08-02 NOTE — Telephone Encounter (Signed)
Please verbally ok in and out cath with ua and culture Thanks for letting us know  Will cc pcp

## 2019-08-02 NOTE — Telephone Encounter (Signed)
Agreed.  I will await the lab report.  Please get update on patient when possible, when clinic is back open.  Thanks.

## 2019-08-02 NOTE — Telephone Encounter (Signed)
Tina nurse with [facility] left a voicemail wanting to know if they can do an in and out cath? Otila Kluver stated that the patient is complaining of his testicles hurting and burning after urinating. Otila Kluver stated that the testicles are not red. Otila Kluver stated that the patient's daughter put some A & D ointment on his testicles and he said they felt better. Otila Kluver wants to know if she can get an order for a UA and culture? Dr. Damita Dunnings is out of the office.

## 2019-08-04 DIAGNOSIS — R001 Bradycardia, unspecified: Secondary | ICD-10-CM | POA: Diagnosis not present

## 2019-08-04 DIAGNOSIS — I959 Hypotension, unspecified: Secondary | ICD-10-CM | POA: Diagnosis not present

## 2019-08-04 DIAGNOSIS — J9601 Acute respiratory failure with hypoxia: Secondary | ICD-10-CM | POA: Diagnosis not present

## 2019-08-04 DIAGNOSIS — Z4800 Encounter for change or removal of nonsurgical wound dressing: Secondary | ICD-10-CM | POA: Diagnosis not present

## 2019-08-04 DIAGNOSIS — R1312 Dysphagia, oropharyngeal phase: Secondary | ICD-10-CM | POA: Diagnosis not present

## 2019-08-04 DIAGNOSIS — N179 Acute kidney failure, unspecified: Secondary | ICD-10-CM | POA: Diagnosis not present

## 2019-08-04 DIAGNOSIS — L89322 Pressure ulcer of left buttock, stage 2: Secondary | ICD-10-CM | POA: Diagnosis not present

## 2019-08-04 DIAGNOSIS — N189 Chronic kidney disease, unspecified: Secondary | ICD-10-CM | POA: Diagnosis not present

## 2019-08-04 DIAGNOSIS — J441 Chronic obstructive pulmonary disease with (acute) exacerbation: Secondary | ICD-10-CM | POA: Diagnosis not present

## 2019-08-04 DIAGNOSIS — L89152 Pressure ulcer of sacral region, stage 2: Secondary | ICD-10-CM | POA: Diagnosis not present

## 2019-08-04 DIAGNOSIS — I129 Hypertensive chronic kidney disease with stage 1 through stage 4 chronic kidney disease, or unspecified chronic kidney disease: Secondary | ICD-10-CM | POA: Diagnosis not present

## 2019-08-04 NOTE — Telephone Encounter (Signed)
Need to know how the pt is doing and also if [facility] was able to draw blood for labs ordered by Dr. Damita Dunnings

## 2019-08-04 NOTE — Telephone Encounter (Signed)
LVM for pt's daughter, [name].

## 2019-08-05 DIAGNOSIS — J449 Chronic obstructive pulmonary disease, unspecified: Secondary | ICD-10-CM | POA: Diagnosis not present

## 2019-08-08 DIAGNOSIS — N189 Chronic kidney disease, unspecified: Secondary | ICD-10-CM | POA: Diagnosis not present

## 2019-08-08 DIAGNOSIS — I10 Essential (primary) hypertension: Secondary | ICD-10-CM | POA: Diagnosis not present

## 2019-08-08 DIAGNOSIS — I959 Hypotension, unspecified: Secondary | ICD-10-CM | POA: Diagnosis not present

## 2019-08-08 DIAGNOSIS — J9601 Acute respiratory failure with hypoxia: Secondary | ICD-10-CM | POA: Diagnosis not present

## 2019-08-08 DIAGNOSIS — J441 Chronic obstructive pulmonary disease with (acute) exacerbation: Secondary | ICD-10-CM | POA: Diagnosis not present

## 2019-08-08 DIAGNOSIS — R001 Bradycardia, unspecified: Secondary | ICD-10-CM | POA: Diagnosis not present

## 2019-08-08 DIAGNOSIS — L89152 Pressure ulcer of sacral region, stage 2: Secondary | ICD-10-CM | POA: Diagnosis not present

## 2019-08-08 DIAGNOSIS — I129 Hypertensive chronic kidney disease with stage 1 through stage 4 chronic kidney disease, or unspecified chronic kidney disease: Secondary | ICD-10-CM | POA: Diagnosis not present

## 2019-08-08 DIAGNOSIS — R1312 Dysphagia, oropharyngeal phase: Secondary | ICD-10-CM | POA: Diagnosis not present

## 2019-08-08 DIAGNOSIS — Z4800 Encounter for change or removal of nonsurgical wound dressing: Secondary | ICD-10-CM | POA: Diagnosis not present

## 2019-08-08 DIAGNOSIS — L89322 Pressure ulcer of left buttock, stage 2: Secondary | ICD-10-CM | POA: Diagnosis not present

## 2019-08-08 DIAGNOSIS — N179 Acute kidney failure, unspecified: Secondary | ICD-10-CM | POA: Diagnosis not present

## 2019-08-08 LAB — LAB REPORT - SCANNED
ALT: 6 (ref 3–30)
AST: 21
Creatinine, Ser: 0.8
Glucose: 98
Hemoglobin: 9.1

## 2019-08-08 NOTE — Telephone Encounter (Signed)
[name] sent the same nurse out that was unsuccessful at the blood draw and she was not able to get his blood.  Another nurse is due anytime now to try again.  This is the nurse that was successful previously.  This nurse will also be getting a urine sample for urinalysis.  Family has not picked up the IFOB yet but will do so as soon as they can.

## 2019-08-08 NOTE — Telephone Encounter (Signed)
[facility] sent the same nurse out that was unsuccessful at the blood draw and she was not able to get his blood.  Another nurse is due anytime now to try again.  This is the nurse that was successful previously.  This nurse will also be getting a urine sample for urinalysis.  Family has not picked up the IFOB yet but will do so as soon as they can.

## 2019-08-09 ENCOUNTER — Encounter: Payer: Self-pay | Admitting: Family Medicine

## 2019-08-09 ENCOUNTER — Telehealth: Payer: Self-pay | Admitting: *Deleted

## 2019-08-09 DIAGNOSIS — N189 Chronic kidney disease, unspecified: Secondary | ICD-10-CM | POA: Diagnosis not present

## 2019-08-09 DIAGNOSIS — Z4800 Encounter for change or removal of nonsurgical wound dressing: Secondary | ICD-10-CM | POA: Diagnosis not present

## 2019-08-09 DIAGNOSIS — J9601 Acute respiratory failure with hypoxia: Secondary | ICD-10-CM | POA: Diagnosis not present

## 2019-08-09 DIAGNOSIS — I959 Hypotension, unspecified: Secondary | ICD-10-CM | POA: Diagnosis not present

## 2019-08-09 DIAGNOSIS — R1312 Dysphagia, oropharyngeal phase: Secondary | ICD-10-CM | POA: Diagnosis not present

## 2019-08-09 DIAGNOSIS — I129 Hypertensive chronic kidney disease with stage 1 through stage 4 chronic kidney disease, or unspecified chronic kidney disease: Secondary | ICD-10-CM | POA: Diagnosis not present

## 2019-08-09 DIAGNOSIS — L89322 Pressure ulcer of left buttock, stage 2: Secondary | ICD-10-CM | POA: Diagnosis not present

## 2019-08-09 DIAGNOSIS — J441 Chronic obstructive pulmonary disease with (acute) exacerbation: Secondary | ICD-10-CM | POA: Diagnosis not present

## 2019-08-09 DIAGNOSIS — R001 Bradycardia, unspecified: Secondary | ICD-10-CM | POA: Diagnosis not present

## 2019-08-09 DIAGNOSIS — L89152 Pressure ulcer of sacral region, stage 2: Secondary | ICD-10-CM | POA: Diagnosis not present

## 2019-08-09 DIAGNOSIS — N179 Acute kidney failure, unspecified: Secondary | ICD-10-CM | POA: Diagnosis not present

## 2019-08-09 NOTE — Telephone Encounter (Signed)
Noted. Thanks.

## 2019-08-09 NOTE — Telephone Encounter (Signed)
Given that a clean-catch will be difficult to obtain, and given the risk of repeated catheterization, I would defer further urinalysis for now and treat the skin irritation.  If he has consistent dysuria otherwise in spite of treatment then please let me know.  Thanks.

## 2019-08-09 NOTE — Telephone Encounter (Signed)
Please give the order for wound care/barrier cream.  Let me know if this is getting worse in the meantime.  Please try to alternate pressure by rolling the patient/adjusting him in the bed/using pillows/doughnut cushion to redistribute weight.  If the had trouble advancing the catheter (but if he is still producing urine otherwise) then I would not try to catheterize him again at home.  He likely does have a component of BPH given his age and it may be more traumatic to attempt a second catheterization.  If the patient has any awareness of urinary urge, then they may be able to collect a clean-catch sample in bed at home.  That may be the best option.  Thanks.  I will await the labs.

## 2019-08-09 NOTE — Telephone Encounter (Signed)
[name] notified as instructed by telephone and verbalized understanding. [name] stated that he is not her regular patient, but she went out to draw his labs because the other nurse had a problem drawing them last time. Eustaquio Maize stated that patient is not able to communicate and is incontinent and will not be able to do a clean catch urine, because he has no idea when he has to urinate.  [name] stated it is hard to tell if he has a UTI or if he is burning because of the redness on his scrotum and buttocks. [name] stated that the family told her that sometimes he tells them he burns.Advised [name] that Dr. Damita Dunnings will wait on the lab results.

## 2019-08-09 NOTE — Telephone Encounter (Signed)
[name] nurse with [facility] left a voicemail stating that they have a order for lab work and UA. [name] stated last week they were not able to do a UA and she tried again yesterday and was not able to advance the cathether pass midway up his urinary tract.  [name] stated that they was able to get the blood work and it was sent out and you should be receiving the results. [name] wants to know how they should proceed with this? [name] stated that he also has redness stage 1 pressure area on his scrotum and right buttocks. [name] wants to know if it is okay for her to use a barrier cream? Eustaquio Maize stated that the family has been applying Desitin. Patient stated that she needs to put this in his records.

## 2019-08-10 NOTE — Telephone Encounter (Addendum)
Daughter advised.  Daughter will be taking the IFOB to the [facility] lab for processing because it is more convenient for her.

## 2019-08-11 ENCOUNTER — Telehealth: Payer: Self-pay | Admitting: Family Medicine

## 2019-08-11 ENCOUNTER — Other Ambulatory Visit (INDEPENDENT_AMBULATORY_CARE_PROVIDER_SITE_OTHER): Payer: Medicare Other

## 2019-08-11 DIAGNOSIS — D649 Anemia, unspecified: Secondary | ICD-10-CM

## 2019-08-11 LAB — FECAL OCCULT BLOOD, IMMUNOCHEMICAL: Fecal Occult Bld: NEGATIVE

## 2019-08-11 NOTE — Telephone Encounter (Signed)
Daughter called stating she dropped of stool sample @ the [facility] office downstairs in lab

## 2019-08-12 DIAGNOSIS — I959 Hypotension, unspecified: Secondary | ICD-10-CM | POA: Diagnosis not present

## 2019-08-12 DIAGNOSIS — I129 Hypertensive chronic kidney disease with stage 1 through stage 4 chronic kidney disease, or unspecified chronic kidney disease: Secondary | ICD-10-CM | POA: Diagnosis not present

## 2019-08-12 DIAGNOSIS — N179 Acute kidney failure, unspecified: Secondary | ICD-10-CM | POA: Diagnosis not present

## 2019-08-12 DIAGNOSIS — R1312 Dysphagia, oropharyngeal phase: Secondary | ICD-10-CM | POA: Diagnosis not present

## 2019-08-12 DIAGNOSIS — L89322 Pressure ulcer of left buttock, stage 2: Secondary | ICD-10-CM | POA: Diagnosis not present

## 2019-08-12 DIAGNOSIS — J441 Chronic obstructive pulmonary disease with (acute) exacerbation: Secondary | ICD-10-CM | POA: Diagnosis not present

## 2019-08-12 DIAGNOSIS — J9601 Acute respiratory failure with hypoxia: Secondary | ICD-10-CM | POA: Diagnosis not present

## 2019-08-12 DIAGNOSIS — Z4800 Encounter for change or removal of nonsurgical wound dressing: Secondary | ICD-10-CM | POA: Diagnosis not present

## 2019-08-12 DIAGNOSIS — L89152 Pressure ulcer of sacral region, stage 2: Secondary | ICD-10-CM | POA: Diagnosis not present

## 2019-08-12 DIAGNOSIS — N189 Chronic kidney disease, unspecified: Secondary | ICD-10-CM | POA: Diagnosis not present

## 2019-08-12 DIAGNOSIS — R001 Bradycardia, unspecified: Secondary | ICD-10-CM | POA: Diagnosis not present

## 2019-08-13 NOTE — Telephone Encounter (Signed)
Please notify them that the IFOB was negative.  This is good news.  I am awaiting the follow-up lab work.  Please see if you can get a copy of this, if it does not come in on Monday.  Thanks.

## 2019-08-14 ENCOUNTER — Encounter: Payer: Self-pay | Admitting: Family Medicine

## 2019-08-14 DIAGNOSIS — I441 Atrioventricular block, second degree: Secondary | ICD-10-CM | POA: Diagnosis not present

## 2019-08-14 DIAGNOSIS — R6889 Other general symptoms and signs: Secondary | ICD-10-CM | POA: Diagnosis not present

## 2019-08-14 NOTE — Telephone Encounter (Signed)
Daughter advised.  Cardiology appt is 09/03/2019.

## 2019-08-14 NOTE — Telephone Encounter (Signed)
See hardcopy. Thanks.

## 2019-08-15 DIAGNOSIS — R6889 Other general symptoms and signs: Secondary | ICD-10-CM | POA: Diagnosis not present

## 2019-08-15 DIAGNOSIS — C4441 Basal cell carcinoma of skin of scalp and neck: Secondary | ICD-10-CM | POA: Diagnosis not present

## 2019-08-23 DIAGNOSIS — I959 Hypotension, unspecified: Secondary | ICD-10-CM | POA: Diagnosis not present

## 2019-08-23 DIAGNOSIS — R1312 Dysphagia, oropharyngeal phase: Secondary | ICD-10-CM | POA: Diagnosis not present

## 2019-08-23 DIAGNOSIS — J441 Chronic obstructive pulmonary disease with (acute) exacerbation: Secondary | ICD-10-CM | POA: Diagnosis not present

## 2019-08-23 DIAGNOSIS — R001 Bradycardia, unspecified: Secondary | ICD-10-CM | POA: Diagnosis not present

## 2019-08-23 DIAGNOSIS — Z4800 Encounter for change or removal of nonsurgical wound dressing: Secondary | ICD-10-CM | POA: Diagnosis not present

## 2019-08-23 DIAGNOSIS — N179 Acute kidney failure, unspecified: Secondary | ICD-10-CM | POA: Diagnosis not present

## 2019-08-23 DIAGNOSIS — N189 Chronic kidney disease, unspecified: Secondary | ICD-10-CM | POA: Diagnosis not present

## 2019-08-23 DIAGNOSIS — J9601 Acute respiratory failure with hypoxia: Secondary | ICD-10-CM | POA: Diagnosis not present

## 2019-08-23 DIAGNOSIS — I129 Hypertensive chronic kidney disease with stage 1 through stage 4 chronic kidney disease, or unspecified chronic kidney disease: Secondary | ICD-10-CM | POA: Diagnosis not present

## 2019-08-23 DIAGNOSIS — L89322 Pressure ulcer of left buttock, stage 2: Secondary | ICD-10-CM | POA: Diagnosis not present

## 2019-08-23 DIAGNOSIS — L89152 Pressure ulcer of sacral region, stage 2: Secondary | ICD-10-CM | POA: Diagnosis not present

## 2019-08-24 DIAGNOSIS — R6889 Other general symptoms and signs: Secondary | ICD-10-CM | POA: Diagnosis not present

## 2019-08-25 DIAGNOSIS — J9601 Acute respiratory failure with hypoxia: Secondary | ICD-10-CM | POA: Diagnosis not present

## 2019-08-25 DIAGNOSIS — R001 Bradycardia, unspecified: Secondary | ICD-10-CM | POA: Diagnosis not present

## 2019-08-25 DIAGNOSIS — R1312 Dysphagia, oropharyngeal phase: Secondary | ICD-10-CM | POA: Diagnosis not present

## 2019-08-25 DIAGNOSIS — I959 Hypotension, unspecified: Secondary | ICD-10-CM | POA: Diagnosis not present

## 2019-08-25 DIAGNOSIS — L89322 Pressure ulcer of left buttock, stage 2: Secondary | ICD-10-CM | POA: Diagnosis not present

## 2019-08-25 DIAGNOSIS — Z4800 Encounter for change or removal of nonsurgical wound dressing: Secondary | ICD-10-CM | POA: Diagnosis not present

## 2019-08-25 DIAGNOSIS — J441 Chronic obstructive pulmonary disease with (acute) exacerbation: Secondary | ICD-10-CM | POA: Diagnosis not present

## 2019-08-25 DIAGNOSIS — L89152 Pressure ulcer of sacral region, stage 2: Secondary | ICD-10-CM | POA: Diagnosis not present

## 2019-08-25 DIAGNOSIS — N179 Acute kidney failure, unspecified: Secondary | ICD-10-CM | POA: Diagnosis not present

## 2019-08-25 DIAGNOSIS — I129 Hypertensive chronic kidney disease with stage 1 through stage 4 chronic kidney disease, or unspecified chronic kidney disease: Secondary | ICD-10-CM | POA: Diagnosis not present

## 2019-08-25 DIAGNOSIS — N189 Chronic kidney disease, unspecified: Secondary | ICD-10-CM | POA: Diagnosis not present

## 2019-08-28 DIAGNOSIS — R1312 Dysphagia, oropharyngeal phase: Secondary | ICD-10-CM | POA: Diagnosis not present

## 2019-08-28 DIAGNOSIS — J441 Chronic obstructive pulmonary disease with (acute) exacerbation: Secondary | ICD-10-CM | POA: Diagnosis not present

## 2019-08-28 DIAGNOSIS — L89322 Pressure ulcer of left buttock, stage 2: Secondary | ICD-10-CM | POA: Diagnosis not present

## 2019-08-28 DIAGNOSIS — J9601 Acute respiratory failure with hypoxia: Secondary | ICD-10-CM | POA: Diagnosis not present

## 2019-08-28 DIAGNOSIS — L89152 Pressure ulcer of sacral region, stage 2: Secondary | ICD-10-CM | POA: Diagnosis not present

## 2019-08-28 DIAGNOSIS — N189 Chronic kidney disease, unspecified: Secondary | ICD-10-CM | POA: Diagnosis not present

## 2019-08-28 DIAGNOSIS — I129 Hypertensive chronic kidney disease with stage 1 through stage 4 chronic kidney disease, or unspecified chronic kidney disease: Secondary | ICD-10-CM | POA: Diagnosis not present

## 2019-08-28 DIAGNOSIS — Z4800 Encounter for change or removal of nonsurgical wound dressing: Secondary | ICD-10-CM | POA: Diagnosis not present

## 2019-08-28 DIAGNOSIS — I959 Hypotension, unspecified: Secondary | ICD-10-CM | POA: Diagnosis not present

## 2019-08-28 DIAGNOSIS — R001 Bradycardia, unspecified: Secondary | ICD-10-CM | POA: Diagnosis not present

## 2019-08-28 DIAGNOSIS — N179 Acute kidney failure, unspecified: Secondary | ICD-10-CM | POA: Diagnosis not present

## 2019-08-29 ENCOUNTER — Telehealth: Payer: Self-pay

## 2019-08-29 NOTE — Telephone Encounter (Signed)
Please give the order.  Thanks.   

## 2019-08-29 NOTE — Telephone Encounter (Signed)
I received a call from [facility], PT asking to extend PT services for patient for 1x week for 1 week, and then 2x week for 3 weeks. Is this ok? [name] can be reached back at [phone]

## 2019-08-29 NOTE — Telephone Encounter (Signed)
[name] [name] with PT advised.

## 2019-09-01 DIAGNOSIS — I959 Hypotension, unspecified: Secondary | ICD-10-CM | POA: Diagnosis not present

## 2019-09-01 DIAGNOSIS — I130 Hypertensive heart and chronic kidney disease with heart failure and stage 1 through stage 4 chronic kidney disease, or unspecified chronic kidney disease: Secondary | ICD-10-CM | POA: Diagnosis not present

## 2019-09-01 DIAGNOSIS — D696 Thrombocytopenia, unspecified: Secondary | ICD-10-CM | POA: Diagnosis not present

## 2019-09-01 DIAGNOSIS — K219 Gastro-esophageal reflux disease without esophagitis: Secondary | ICD-10-CM | POA: Diagnosis not present

## 2019-09-01 DIAGNOSIS — N189 Chronic kidney disease, unspecified: Secondary | ICD-10-CM | POA: Diagnosis not present

## 2019-09-01 DIAGNOSIS — J441 Chronic obstructive pulmonary disease with (acute) exacerbation: Secondary | ICD-10-CM | POA: Diagnosis not present

## 2019-09-01 DIAGNOSIS — K5900 Constipation, unspecified: Secondary | ICD-10-CM | POA: Diagnosis not present

## 2019-09-01 DIAGNOSIS — E876 Hypokalemia: Secondary | ICD-10-CM | POA: Diagnosis not present

## 2019-09-01 DIAGNOSIS — Z87891 Personal history of nicotine dependence: Secondary | ICD-10-CM | POA: Diagnosis not present

## 2019-09-01 DIAGNOSIS — I5032 Chronic diastolic (congestive) heart failure: Secondary | ICD-10-CM | POA: Diagnosis not present

## 2019-09-01 DIAGNOSIS — D631 Anemia in chronic kidney disease: Secondary | ICD-10-CM | POA: Diagnosis not present

## 2019-09-02 DIAGNOSIS — J449 Chronic obstructive pulmonary disease, unspecified: Secondary | ICD-10-CM | POA: Diagnosis not present

## 2019-09-06 ENCOUNTER — Ambulatory Visit: Payer: Medicare Other | Admitting: Pulmonary Disease

## 2019-09-11 DIAGNOSIS — D696 Thrombocytopenia, unspecified: Secondary | ICD-10-CM | POA: Diagnosis not present

## 2019-09-11 DIAGNOSIS — I130 Hypertensive heart and chronic kidney disease with heart failure and stage 1 through stage 4 chronic kidney disease, or unspecified chronic kidney disease: Secondary | ICD-10-CM | POA: Diagnosis not present

## 2019-09-11 DIAGNOSIS — N189 Chronic kidney disease, unspecified: Secondary | ICD-10-CM | POA: Diagnosis not present

## 2019-09-11 DIAGNOSIS — E876 Hypokalemia: Secondary | ICD-10-CM | POA: Diagnosis not present

## 2019-09-11 DIAGNOSIS — D631 Anemia in chronic kidney disease: Secondary | ICD-10-CM | POA: Diagnosis not present

## 2019-09-11 DIAGNOSIS — I5032 Chronic diastolic (congestive) heart failure: Secondary | ICD-10-CM | POA: Diagnosis not present

## 2019-09-11 DIAGNOSIS — Z87891 Personal history of nicotine dependence: Secondary | ICD-10-CM | POA: Diagnosis not present

## 2019-09-11 DIAGNOSIS — K219 Gastro-esophageal reflux disease without esophagitis: Secondary | ICD-10-CM | POA: Diagnosis not present

## 2019-09-11 DIAGNOSIS — K5900 Constipation, unspecified: Secondary | ICD-10-CM | POA: Diagnosis not present

## 2019-09-11 DIAGNOSIS — J441 Chronic obstructive pulmonary disease with (acute) exacerbation: Secondary | ICD-10-CM | POA: Diagnosis not present

## 2019-09-11 DIAGNOSIS — I959 Hypotension, unspecified: Secondary | ICD-10-CM | POA: Diagnosis not present

## 2019-09-13 ENCOUNTER — Encounter: Payer: Self-pay | Admitting: Family Medicine

## 2019-09-13 DIAGNOSIS — K219 Gastro-esophageal reflux disease without esophagitis: Secondary | ICD-10-CM | POA: Diagnosis not present

## 2019-09-13 DIAGNOSIS — Z87891 Personal history of nicotine dependence: Secondary | ICD-10-CM | POA: Diagnosis not present

## 2019-09-13 DIAGNOSIS — J441 Chronic obstructive pulmonary disease with (acute) exacerbation: Secondary | ICD-10-CM | POA: Diagnosis not present

## 2019-09-13 DIAGNOSIS — N189 Chronic kidney disease, unspecified: Secondary | ICD-10-CM | POA: Diagnosis not present

## 2019-09-13 DIAGNOSIS — I959 Hypotension, unspecified: Secondary | ICD-10-CM | POA: Diagnosis not present

## 2019-09-13 DIAGNOSIS — I130 Hypertensive heart and chronic kidney disease with heart failure and stage 1 through stage 4 chronic kidney disease, or unspecified chronic kidney disease: Secondary | ICD-10-CM | POA: Diagnosis not present

## 2019-09-13 DIAGNOSIS — I5032 Chronic diastolic (congestive) heart failure: Secondary | ICD-10-CM | POA: Diagnosis not present

## 2019-09-13 DIAGNOSIS — K5900 Constipation, unspecified: Secondary | ICD-10-CM | POA: Diagnosis not present

## 2019-09-13 DIAGNOSIS — E876 Hypokalemia: Secondary | ICD-10-CM | POA: Diagnosis not present

## 2019-09-13 DIAGNOSIS — D696 Thrombocytopenia, unspecified: Secondary | ICD-10-CM | POA: Diagnosis not present

## 2019-09-13 DIAGNOSIS — D631 Anemia in chronic kidney disease: Secondary | ICD-10-CM | POA: Diagnosis not present

## 2019-09-14 DIAGNOSIS — J690 Pneumonitis due to inhalation of food and vomit: Secondary | ICD-10-CM | POA: Diagnosis not present

## 2019-09-15 ENCOUNTER — Telehealth: Payer: Self-pay | Admitting: Internal Medicine

## 2019-09-15 ENCOUNTER — Other Ambulatory Visit: Payer: Self-pay | Admitting: Family Medicine

## 2019-09-15 DIAGNOSIS — I5032 Chronic diastolic (congestive) heart failure: Secondary | ICD-10-CM

## 2019-09-15 DIAGNOSIS — R5381 Other malaise: Secondary | ICD-10-CM

## 2019-09-15 MED ORDER — QUETIAPINE FUMARATE 25 MG PO TABS: ORAL_TABLET | ORAL | Status: AC

## 2019-09-15 NOTE — Telephone Encounter (Signed)
Scheduled [facility] visit for 09-18-19 at 2:30.

## 2019-09-15 NOTE — Telephone Encounter (Signed)
Phone call placed to patient to offer to schedule a visit with [facility]. Phone rang, with no answer I left a voicemail for call back. 
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
## 2019-09-18 ENCOUNTER — Other Ambulatory Visit: Payer: Self-pay

## 2019-09-18 ENCOUNTER — Other Ambulatory Visit: Payer: Medicare Other | Admitting: Internal Medicine

## 2019-09-18 ENCOUNTER — Encounter: Payer: Self-pay | Admitting: Internal Medicine

## 2019-09-18 DIAGNOSIS — I130 Hypertensive heart and chronic kidney disease with heart failure and stage 1 through stage 4 chronic kidney disease, or unspecified chronic kidney disease: Secondary | ICD-10-CM | POA: Diagnosis not present

## 2019-09-18 DIAGNOSIS — E876 Hypokalemia: Secondary | ICD-10-CM | POA: Diagnosis not present

## 2019-09-18 DIAGNOSIS — D631 Anemia in chronic kidney disease: Secondary | ICD-10-CM | POA: Diagnosis not present

## 2019-09-18 DIAGNOSIS — K219 Gastro-esophageal reflux disease without esophagitis: Secondary | ICD-10-CM | POA: Diagnosis not present

## 2019-09-18 DIAGNOSIS — Z515 Encounter for palliative care: Secondary | ICD-10-CM

## 2019-09-18 DIAGNOSIS — N189 Chronic kidney disease, unspecified: Secondary | ICD-10-CM | POA: Diagnosis not present

## 2019-09-18 DIAGNOSIS — I959 Hypotension, unspecified: Secondary | ICD-10-CM | POA: Diagnosis not present

## 2019-09-18 DIAGNOSIS — D696 Thrombocytopenia, unspecified: Secondary | ICD-10-CM | POA: Diagnosis not present

## 2019-09-18 DIAGNOSIS — K5900 Constipation, unspecified: Secondary | ICD-10-CM | POA: Diagnosis not present

## 2019-09-18 DIAGNOSIS — Z87891 Personal history of nicotine dependence: Secondary | ICD-10-CM | POA: Diagnosis not present

## 2019-09-18 DIAGNOSIS — I5032 Chronic diastolic (congestive) heart failure: Secondary | ICD-10-CM | POA: Diagnosis not present

## 2019-09-18 DIAGNOSIS — Z7189 Other specified counseling: Secondary | ICD-10-CM

## 2019-09-18 DIAGNOSIS — J441 Chronic obstructive pulmonary disease with (acute) exacerbation: Secondary | ICD-10-CM | POA: Diagnosis not present

## 2019-09-19 DIAGNOSIS — Z950 Presence of cardiac pacemaker: Secondary | ICD-10-CM

## 2019-09-19 DIAGNOSIS — Z8701 Personal history of pneumonia (recurrent): Secondary | ICD-10-CM

## 2019-09-19 DIAGNOSIS — I130 Hypertensive heart and chronic kidney disease with heart failure and stage 1 through stage 4 chronic kidney disease, or unspecified chronic kidney disease: Secondary | ICD-10-CM | POA: Diagnosis not present

## 2019-09-19 DIAGNOSIS — I5032 Chronic diastolic (congestive) heart failure: Secondary | ICD-10-CM | POA: Diagnosis not present

## 2019-09-19 DIAGNOSIS — I959 Hypotension, unspecified: Secondary | ICD-10-CM

## 2019-09-19 DIAGNOSIS — D631 Anemia in chronic kidney disease: Secondary | ICD-10-CM

## 2019-09-19 DIAGNOSIS — K5900 Constipation, unspecified: Secondary | ICD-10-CM

## 2019-09-19 DIAGNOSIS — J441 Chronic obstructive pulmonary disease with (acute) exacerbation: Secondary | ICD-10-CM | POA: Diagnosis not present

## 2019-09-19 DIAGNOSIS — Z87891 Personal history of nicotine dependence: Secondary | ICD-10-CM

## 2019-09-19 DIAGNOSIS — Z85828 Personal history of other malignant neoplasm of skin: Secondary | ICD-10-CM

## 2019-09-19 DIAGNOSIS — N189 Chronic kidney disease, unspecified: Secondary | ICD-10-CM | POA: Diagnosis not present

## 2019-09-19 DIAGNOSIS — E876 Hypokalemia: Secondary | ICD-10-CM

## 2019-09-19 DIAGNOSIS — Z9181 History of falling: Secondary | ICD-10-CM

## 2019-09-19 DIAGNOSIS — D696 Thrombocytopenia, unspecified: Secondary | ICD-10-CM

## 2019-09-19 DIAGNOSIS — K219 Gastro-esophageal reflux disease without esophagitis: Secondary | ICD-10-CM

## 2019-09-21 DIAGNOSIS — I959 Hypotension, unspecified: Secondary | ICD-10-CM | POA: Diagnosis not present

## 2019-09-21 DIAGNOSIS — I130 Hypertensive heart and chronic kidney disease with heart failure and stage 1 through stage 4 chronic kidney disease, or unspecified chronic kidney disease: Secondary | ICD-10-CM | POA: Diagnosis not present

## 2019-09-21 DIAGNOSIS — J441 Chronic obstructive pulmonary disease with (acute) exacerbation: Secondary | ICD-10-CM | POA: Diagnosis not present

## 2019-09-21 DIAGNOSIS — D696 Thrombocytopenia, unspecified: Secondary | ICD-10-CM | POA: Diagnosis not present

## 2019-09-21 DIAGNOSIS — E876 Hypokalemia: Secondary | ICD-10-CM | POA: Diagnosis not present

## 2019-09-21 DIAGNOSIS — I5032 Chronic diastolic (congestive) heart failure: Secondary | ICD-10-CM | POA: Diagnosis not present

## 2019-09-21 DIAGNOSIS — N189 Chronic kidney disease, unspecified: Secondary | ICD-10-CM | POA: Diagnosis not present

## 2019-09-21 DIAGNOSIS — K219 Gastro-esophageal reflux disease without esophagitis: Secondary | ICD-10-CM | POA: Diagnosis not present

## 2019-09-21 DIAGNOSIS — K5900 Constipation, unspecified: Secondary | ICD-10-CM | POA: Diagnosis not present

## 2019-09-21 DIAGNOSIS — Z87891 Personal history of nicotine dependence: Secondary | ICD-10-CM | POA: Diagnosis not present

## 2019-09-21 DIAGNOSIS — D631 Anemia in chronic kidney disease: Secondary | ICD-10-CM | POA: Diagnosis not present

## 2019-09-24 IMAGING — MR MR HEAD W/O CM
9 of 10 series · 37 of 48 positions shown · non-contrast
Comparison: Head CT from 03/22/2018 and 10/06/2012

CLINICAL DATA: Encephalopathy. Altered level of consciousness.
Agitation and hallucinations since [REDACTED].

EXAM:
MRI HEAD WITHOUT CONTRAST
TECHNIQUE: Multiplanar, multiecho pulse sequences of the brain and surrounding
structures were obtained without intravenous contrast.

[Series 3: DWI · axial · 3.0mm · 1.09mm/px · z∈[+18,+152]mm · 8 of 100 slices shown (1 of 4)]
[im 1/100]
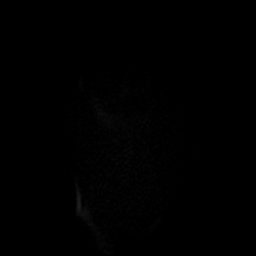
[im 12/100]
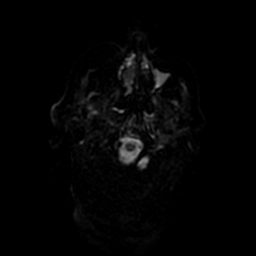
[im 34/100]
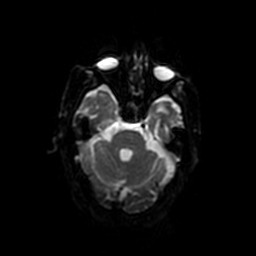
[im 45/100]
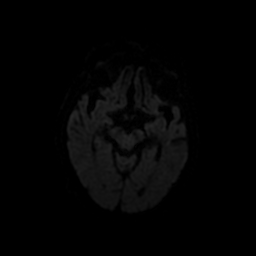
[im 56/100]
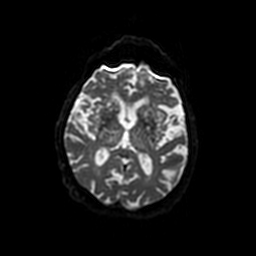
[im 67/100]
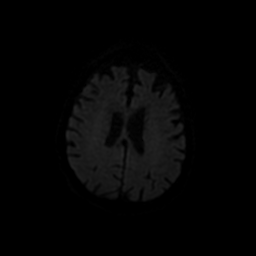
[im 89/100]
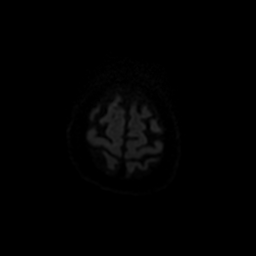
[im 100/100]
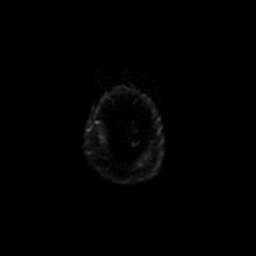

[Series 4: T1 · sagittal · 5.0mm · 0.47mm/px · 2 of 24 slices shown]
[im 1/24]
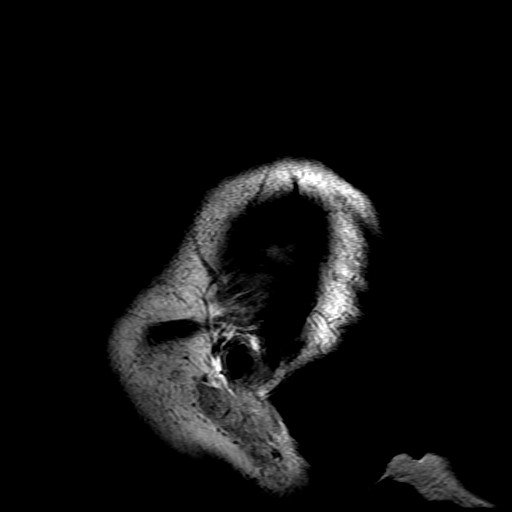
[im 24/24]
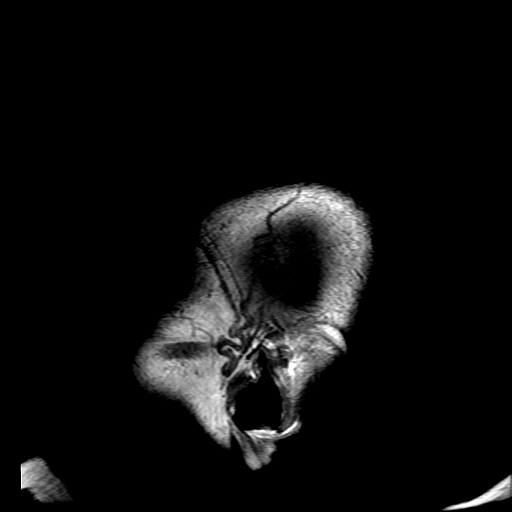

[Series 5: DWI · coronal · 3.0mm · 1.09mm/px · 8 of 100 slices shown (2 of 4)]
[im 1/100]
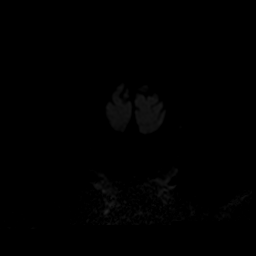
[im 12/100]
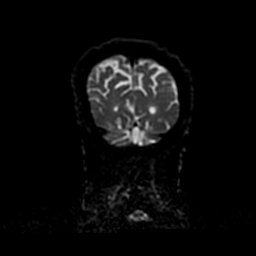
[im 34/100]
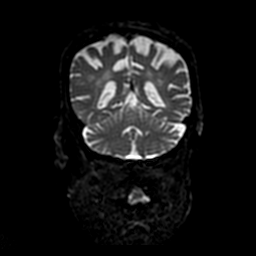
[im 45/100]
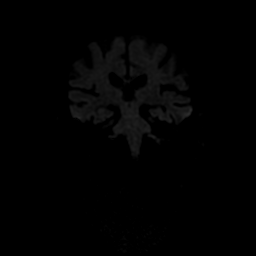
[im 56/100]
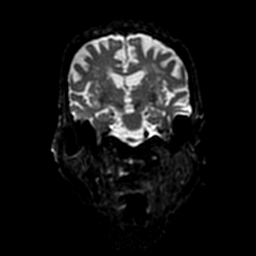
[im 67/100]
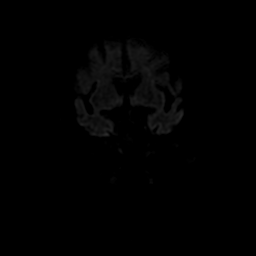
[im 89/100]
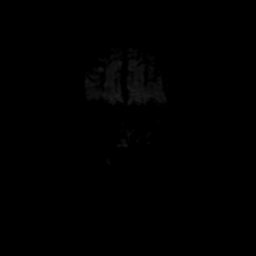
[im 100/100]
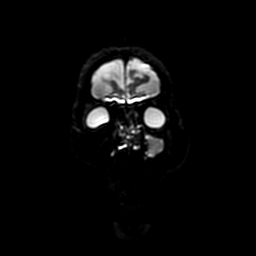

[Series 6: T2 · axial · 5.0mm · 0.43mm/px · z∈[-10,+136]mm · 3 of 28 slices shown (1 of 2)]
[im 1/28]
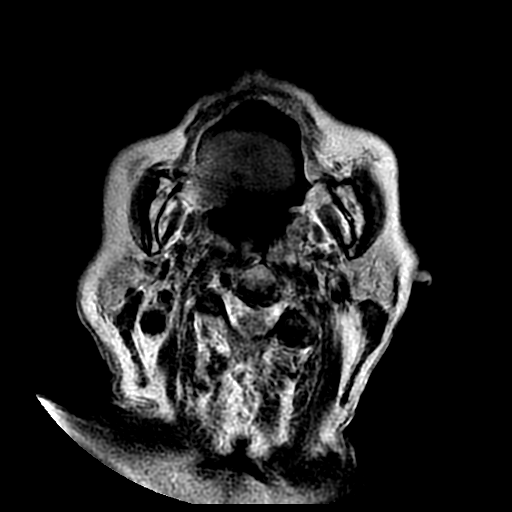
[im 14/28]
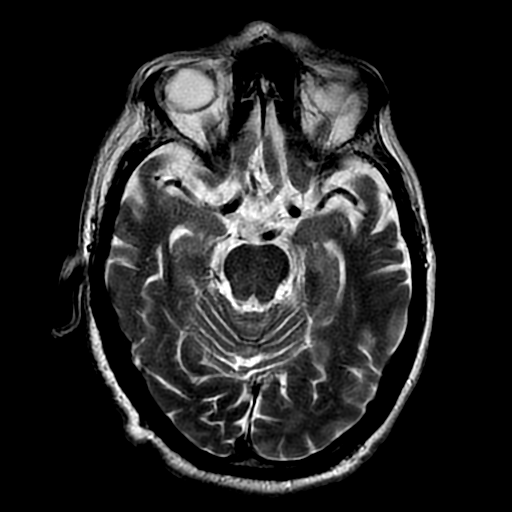
[im 28/28]
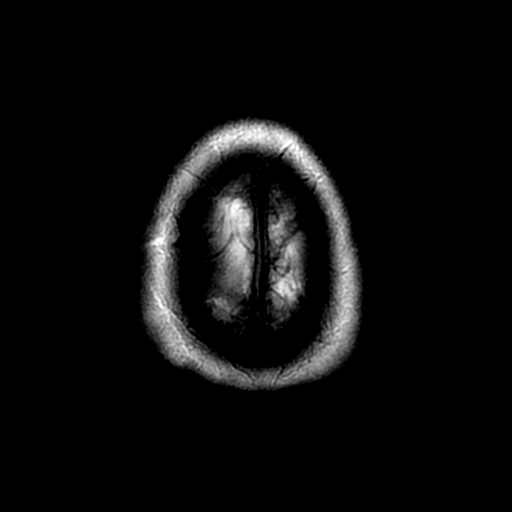

[Series 7: FLAIR · axial · 5.0mm · 0.43mm/px · z∈[-10,+136]mm · 3 of 28 slices shown]
[im 1/28]
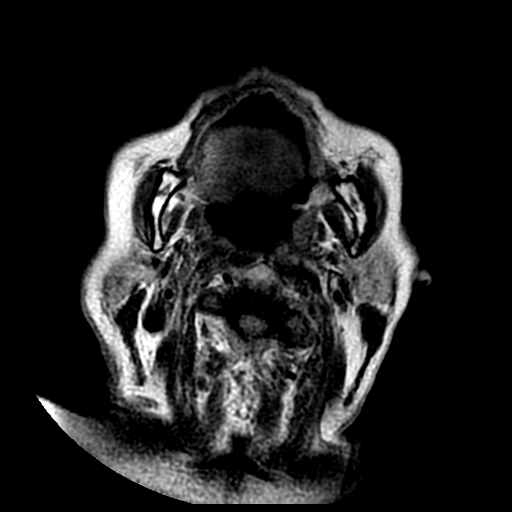
[im 14/28]
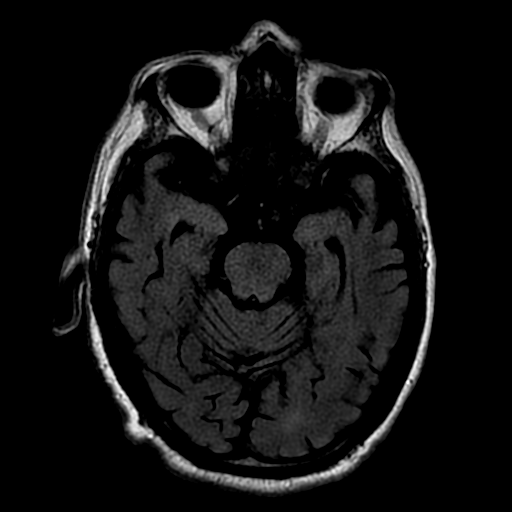
[im 28/28]
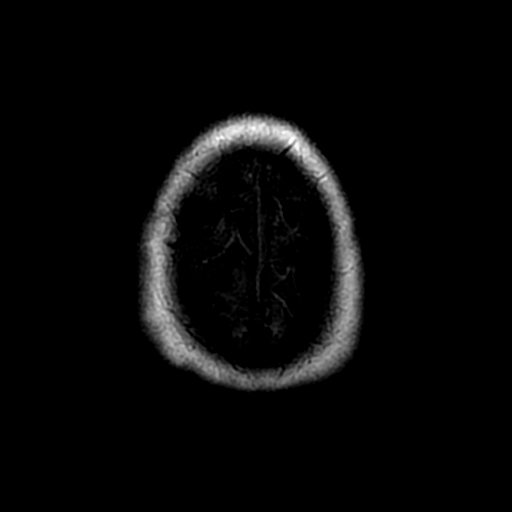

[Series 8: ax mpgr · axial · 5.0mm · 0.47mm/px · 1 of 24 slices shown]
[im 1/24]
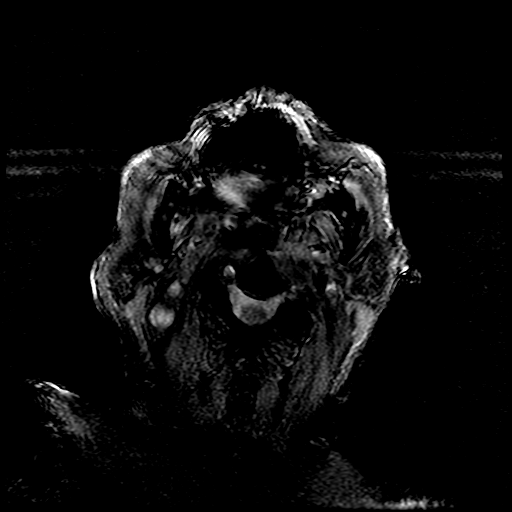

[Series 10: T2 · coronal · 5.0mm · 0.47mm/px · 2 of 24 slices shown (2 of 2)]
[im 1/24]
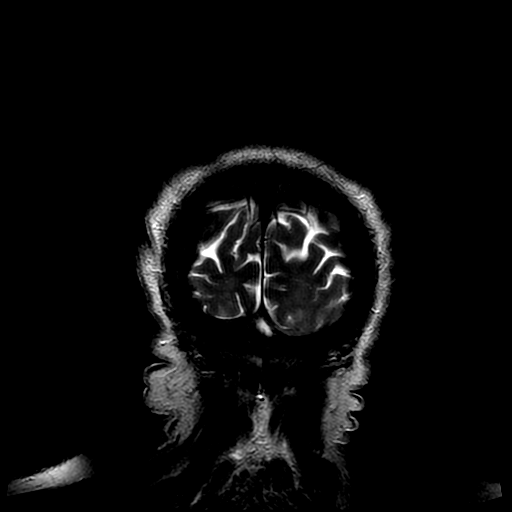
[im 24/24]
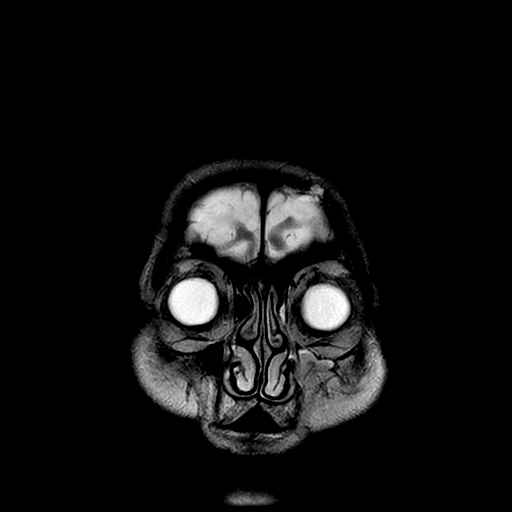

[Series 300: DWI · axial · 3.0mm · 1.09mm/px · z∈[+18,+152]mm · 5 of 50 slices shown (3 of 4)]
[im 1/50]
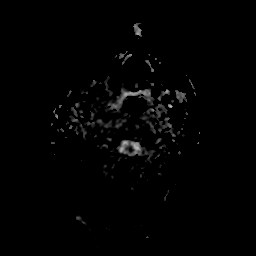
[im 13/50]
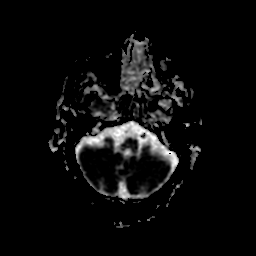
[im 25/50]
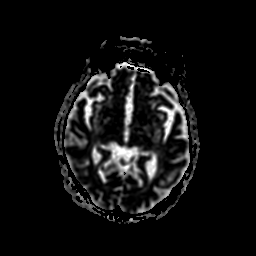
[im 37/50]
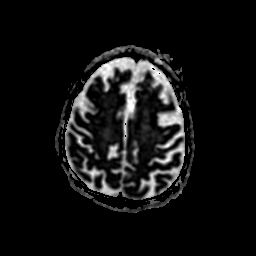
[im 50/50]
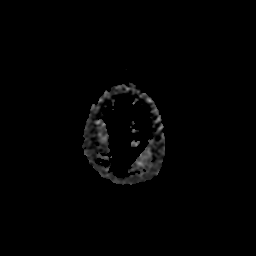

[Series 500: DWI · coronal · 3.0mm · 1.09mm/px · 5 of 50 slices shown (4 of 4)]
[im 1/50]
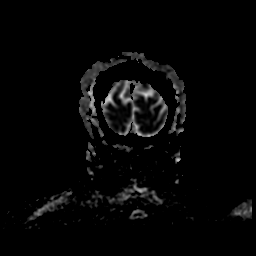
[im 13/50]
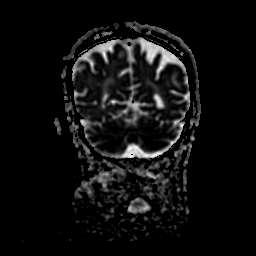
[im 25/50]
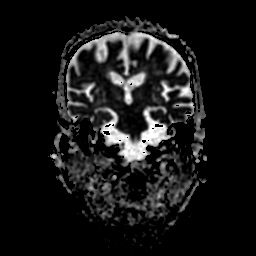
[im 37/50]
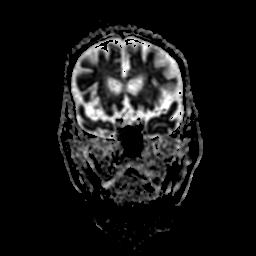
[im 50/50]
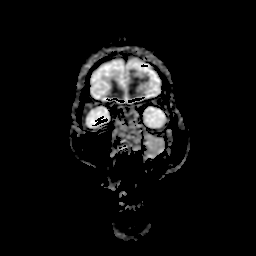

[37 of 48 positions shown; findings below may reference images not displayed]

FINDINGS: Brain: No acute infarction, hemorrhage, hydrocephalus, extra-axial
collection or mass lesion. Moderate atrophy and chronic small vessel
ischemia in the cerebral white matter.

Vascular: Major flow voids are preserved. Strong dominance of the
right vertebral artery.

Skull and upper cervical spine: No evident marrow lesion.

Sinuses/Orbits: Chronic left maxillary sinusitis with atelectasis.
There is enophthalmos on the left, compatible with silent sinus
syndrome. Bilateral cataract resection.
IMPRESSION: 1. No acute finding.
2. Moderate atrophy and chronic small vessel ischemia.

## 2019-09-28 ENCOUNTER — Emergency Department (HOSPITAL_COMMUNITY): Payer: Medicare Other

## 2019-09-28 ENCOUNTER — Telehealth: Payer: Self-pay

## 2019-09-28 ENCOUNTER — Inpatient Hospital Stay (HOSPITAL_COMMUNITY)
Admission: EM | Admit: 2019-09-28 | Discharge: 2019-10-01 | DRG: 070 | Disposition: A | Discharge status: Home Health | Payer: Medicare Other | Attending: Student | Admitting: Student

## 2019-09-28 DIAGNOSIS — I10 Essential (primary) hypertension: Secondary | ICD-10-CM | POA: Diagnosis not present

## 2019-09-28 DIAGNOSIS — Z8249 Family history of ischemic heart disease and other diseases of the circulatory system: Secondary | ICD-10-CM

## 2019-09-28 DIAGNOSIS — I110 Hypertensive heart disease with heart failure: Secondary | ICD-10-CM | POA: Diagnosis not present

## 2019-09-28 DIAGNOSIS — E861 Hypovolemia: Secondary | ICD-10-CM | POA: Diagnosis not present

## 2019-09-28 DIAGNOSIS — Z87891 Personal history of nicotine dependence: Secondary | ICD-10-CM

## 2019-09-28 DIAGNOSIS — R1312 Dysphagia, oropharyngeal phase: Secondary | ICD-10-CM | POA: Diagnosis not present

## 2019-09-28 DIAGNOSIS — R0689 Other abnormalities of breathing: Secondary | ICD-10-CM | POA: Diagnosis not present

## 2019-09-28 DIAGNOSIS — R5381 Other malaise: Secondary | ICD-10-CM | POA: Diagnosis not present

## 2019-09-28 DIAGNOSIS — L89621 Pressure ulcer of left heel, stage 1: Secondary | ICD-10-CM | POA: Diagnosis present

## 2019-09-28 DIAGNOSIS — E860 Dehydration: Secondary | ICD-10-CM | POA: Diagnosis present

## 2019-09-28 DIAGNOSIS — G4700 Insomnia, unspecified: Secondary | ICD-10-CM | POA: Diagnosis not present

## 2019-09-28 DIAGNOSIS — Z66 Do not resuscitate: Secondary | ICD-10-CM | POA: Diagnosis not present

## 2019-09-28 DIAGNOSIS — A419 Sepsis, unspecified organism: Secondary | ICD-10-CM

## 2019-09-28 DIAGNOSIS — Z8616 Personal history of COVID-19: Secondary | ICD-10-CM | POA: Diagnosis not present

## 2019-09-28 DIAGNOSIS — Z993 Dependence on wheelchair: Secondary | ICD-10-CM

## 2019-09-28 DIAGNOSIS — Z79890 Hormone replacement therapy (postmenopausal): Secondary | ICD-10-CM

## 2019-09-28 DIAGNOSIS — Z825 Family history of asthma and other chronic lower respiratory diseases: Secondary | ICD-10-CM

## 2019-09-28 DIAGNOSIS — I5032 Chronic diastolic (congestive) heart failure: Secondary | ICD-10-CM | POA: Diagnosis not present

## 2019-09-28 DIAGNOSIS — R8281 Pyuria: Secondary | ICD-10-CM | POA: Diagnosis present

## 2019-09-28 DIAGNOSIS — L89152 Pressure ulcer of sacral region, stage 2: Secondary | ICD-10-CM | POA: Diagnosis present

## 2019-09-28 DIAGNOSIS — R509 Fever, unspecified: Secondary | ICD-10-CM | POA: Diagnosis not present

## 2019-09-28 DIAGNOSIS — R8271 Bacteriuria: Secondary | ICD-10-CM | POA: Diagnosis present

## 2019-09-28 DIAGNOSIS — J9601 Acute respiratory failure with hypoxia: Secondary | ICD-10-CM | POA: Diagnosis not present

## 2019-09-28 DIAGNOSIS — D649 Anemia, unspecified: Secondary | ICD-10-CM | POA: Diagnosis not present

## 2019-09-28 DIAGNOSIS — E039 Hypothyroidism, unspecified: Secondary | ICD-10-CM | POA: Diagnosis not present

## 2019-09-28 DIAGNOSIS — G9341 Metabolic encephalopathy: Secondary | ICD-10-CM | POA: Diagnosis not present

## 2019-09-28 DIAGNOSIS — J440 Chronic obstructive pulmonary disease with acute lower respiratory infection: Secondary | ICD-10-CM | POA: Diagnosis not present

## 2019-09-28 DIAGNOSIS — E870 Hyperosmolality and hypernatremia: Secondary | ICD-10-CM

## 2019-09-28 DIAGNOSIS — K219 Gastro-esophageal reflux disease without esophagitis: Secondary | ICD-10-CM | POA: Diagnosis present

## 2019-09-28 DIAGNOSIS — J9621 Acute and chronic respiratory failure with hypoxia: Secondary | ICD-10-CM | POA: Diagnosis not present

## 2019-09-28 DIAGNOSIS — E43 Unspecified severe protein-calorie malnutrition: Secondary | ICD-10-CM | POA: Diagnosis present

## 2019-09-28 DIAGNOSIS — R001 Bradycardia, unspecified: Secondary | ICD-10-CM | POA: Diagnosis not present

## 2019-09-28 DIAGNOSIS — Z743 Need for continuous supervision: Secondary | ICD-10-CM | POA: Diagnosis not present

## 2019-09-28 DIAGNOSIS — Z79899 Other long term (current) drug therapy: Secondary | ICD-10-CM

## 2019-09-28 DIAGNOSIS — L98429 Non-pressure chronic ulcer of back with unspecified severity: Secondary | ICD-10-CM | POA: Diagnosis present

## 2019-09-28 DIAGNOSIS — L98421 Non-pressure chronic ulcer of back limited to breakdown of skin: Secondary | ICD-10-CM | POA: Diagnosis not present

## 2019-09-28 DIAGNOSIS — J441 Chronic obstructive pulmonary disease with (acute) exacerbation: Secondary | ICD-10-CM | POA: Diagnosis present

## 2019-09-28 DIAGNOSIS — Z20822 Contact with and (suspected) exposure to covid-19: Secondary | ICD-10-CM | POA: Diagnosis not present

## 2019-09-28 DIAGNOSIS — Z96649 Presence of unspecified artificial hip joint: Secondary | ICD-10-CM | POA: Diagnosis not present

## 2019-09-28 DIAGNOSIS — R404 Transient alteration of awareness: Secondary | ICD-10-CM | POA: Diagnosis not present

## 2019-09-28 DIAGNOSIS — J439 Emphysema, unspecified: Secondary | ICD-10-CM | POA: Diagnosis present

## 2019-09-28 LAB — RESPIRATORY PANEL BY RT PCR (FLU A&B, COVID)
Influenza A by PCR: NEGATIVE
Influenza B by PCR: NEGATIVE
SARS Coronavirus 2 by RT PCR: NEGATIVE

## 2019-09-28 LAB — CBC WITH DIFFERENTIAL/PLATELET
Abs Immature Granulocytes: 0.02 10*3/uL (ref 0.00–0.07)
Basophils Absolute: 0 10*3/uL (ref 0.0–0.1)
Basophils Relative: 0 %
Eosinophils Absolute: 0.1 10*3/uL (ref 0.0–0.5)
Eosinophils Relative: 1 %
HCT: 32.3 % — ABNORMAL LOW (ref 39.0–52.0)
Hemoglobin: 10.2 g/dL — ABNORMAL LOW (ref 13.0–17.0)
Immature Granulocytes: 0 %
Lymphocytes Relative: 25 %
Lymphs Abs: 1.8 10*3/uL (ref 0.7–4.0)
MCH: 30.8 pg (ref 26.0–34.0)
MCHC: 31.6 g/dL (ref 30.0–36.0)
MCV: 97.6 fL (ref 80.0–100.0)
Monocytes Absolute: 0.5 10*3/uL (ref 0.1–1.0)
Monocytes Relative: 7 %
Neutro Abs: 4.6 10*3/uL (ref 1.7–7.7)
Neutrophils Relative %: 67 %
Platelets: 183 10*3/uL (ref 150–400)
RBC: 3.31 MIL/uL — ABNORMAL LOW (ref 4.22–5.81)
RDW: 13.6 % (ref 11.5–15.5)
WBC: 6.9 10*3/uL (ref 4.0–10.5)
nRBC: 0 % (ref 0.0–0.2)

## 2019-09-28 LAB — COMPREHENSIVE METABOLIC PANEL
ALT: 11 U/L (ref 0–44)
AST: 23 U/L (ref 15–41)
Albumin: 2.4 g/dL — ABNORMAL LOW (ref 3.5–5.0)
Alkaline Phosphatase: 60 U/L (ref 38–126)
Anion gap: 12 (ref 5–15)
BUN: 37 mg/dL — ABNORMAL HIGH (ref 8–23)
CO2: 23 mmol/L (ref 22–32)
Calcium: 8.5 mg/dL — ABNORMAL LOW (ref 8.9–10.3)
Chloride: 118 mmol/L — ABNORMAL HIGH (ref 98–111)
Creatinine, Ser: 1.17 mg/dL (ref 0.61–1.24)
GFR calc Af Amer: >60 mL/min (ref >60)
GFR calc non Af Amer: 54 mL/min — ABNORMAL LOW (ref >60)
Glucose, Bld: 90 mg/dL (ref 70–99)
Potassium: 3.5 mmol/L (ref 3.5–5.1)
Sodium: 153 mmol/L — ABNORMAL HIGH (ref 135–145)
Total Bilirubin: 0.9 mg/dL (ref 0.3–1.2)
Total Protein: 6.4 g/dL — ABNORMAL LOW (ref 6.5–8.1)

## 2019-09-28 LAB — PROTIME-INR
INR: 1.2 (ref 0.8–1.2)
Prothrombin Time: 14.6 seconds (ref 11.4–15.2)

## 2019-09-28 LAB — LACTIC ACID, PLASMA: Lactic Acid, Venous: 0.9 mmol/L (ref 0.5–1.9)

## 2019-09-28 MED ORDER — DEXTROSE 5 % IV SOLN: INTRAVENOUS | Status: DC

## 2019-09-28 MED ORDER — ALBUTEROL SULFATE (2.5 MG/3ML) 0.083% IN NEBU: 2.5000 mg | INHALATION_SOLUTION | Freq: Four times a day (QID) | RESPIRATORY_TRACT | Status: DC | PRN

## 2019-09-28 MED ORDER — ACETAMINOPHEN 650 MG RE SUPP
650.0000 mg | Freq: Once | RECTAL | Status: AC
  Administered 2019-09-28: 650 mg via RECTAL
  Filled 2019-09-28: qty 1

## 2019-09-28 MED ORDER — HEPARIN SODIUM (PORCINE) 5000 UNIT/ML IJ SOLN
5000.0000 [IU] | Freq: Three times a day (TID) | INTRAMUSCULAR | Status: DC
  Administered 2019-09-29 – 2019-10-01 (×8): 5000 [IU] via SUBCUTANEOUS
  Filled 2019-09-28 (×8): qty 1

## 2019-09-28 MED ORDER — SODIUM CHLORIDE 0.9 % IV BOLUS: 1000.0000 mL | Freq: Once | INTRAVENOUS | Status: DC

## 2019-09-28 MED ORDER — AZITHROMYCIN 500 MG PO TABS
250.0000 mg | ORAL_TABLET | Freq: Every day | ORAL | Status: DC
  Filled 2019-09-28: qty 1

## 2019-09-28 MED ORDER — AZITHROMYCIN 250 MG PO TABS
500.0000 mg | ORAL_TABLET | Freq: Every day | ORAL | Status: AC
  Administered 2019-09-29: 500 mg via ORAL
  Filled 2019-09-28: qty 2

## 2019-09-28 MED ORDER — LEVOTHYROXINE SODIUM 100 MCG PO TABS
100.0000 ug | ORAL_TABLET | Freq: Every day | ORAL | Status: DC
  Administered 2019-09-30 – 2019-10-01 (×2): 100 ug via ORAL
  Filled 2019-09-28 (×2): qty 1

## 2019-09-28 MED ORDER — VANCOMYCIN HCL IN DEXTROSE 1-5 GM/200ML-% IV SOLN: 1000.0000 mg | Freq: Once | INTRAVENOUS | Status: DC

## 2019-09-28 MED ORDER — GUAIFENESIN ER 600 MG PO TB12: 600.0000 mg | ORAL_TABLET | Freq: Two times a day (BID) | ORAL | Status: DC

## 2019-09-28 MED ORDER — PREDNISONE 20 MG PO TABS: 40.0000 mg | ORAL_TABLET | Freq: Every day | ORAL | Status: DC

## 2019-09-28 MED ORDER — POLYETHYLENE GLYCOL 3350 17 G PO PACK: 17.0000 g | PACK | Freq: Every day | ORAL | Status: DC | PRN

## 2019-09-28 MED ORDER — SODIUM CHLORIDE 0.9 % IV BOLUS
1000.0000 mL | Freq: Once | INTRAVENOUS | Status: AC
  Administered 2019-09-28: 1000 mL via INTRAVENOUS

## 2019-09-28 MED ORDER — SODIUM CHLORIDE 0.9 % IV SOLN
2.0000 g | Freq: Two times a day (BID) | INTRAVENOUS | Status: DC
  Administered 2019-09-28: 2 g via INTRAVENOUS
  Filled 2019-09-28: qty 2

## 2019-09-28 MED ORDER — ONDANSETRON HCL 4 MG PO TABS: 4.0000 mg | ORAL_TABLET | Freq: Four times a day (QID) | ORAL | Status: DC | PRN

## 2019-09-28 MED ORDER — VANCOMYCIN HCL 1250 MG/250ML IV SOLN
1250.0000 mg | Freq: Once | INTRAVENOUS | Status: AC
  Administered 2019-09-28: 1250 mg via INTRAVENOUS
  Filled 2019-09-28: qty 250

## 2019-09-28 MED ORDER — FLUTICASONE-UMECLIDIN-VILANT 100-62.5-25 MCG/INH IN AEPB: 1.0000 | INHALATION_SPRAY | Freq: Every day | RESPIRATORY_TRACT | Status: DC

## 2019-09-28 MED ORDER — PANTOPRAZOLE SODIUM 40 MG PO TBEC
40.0000 mg | DELAYED_RELEASE_TABLET | Freq: Every day | ORAL | Status: DC
  Administered 2019-10-01: 40 mg via ORAL
  Filled 2019-09-28 (×3): qty 1

## 2019-09-28 MED ORDER — ACETAMINOPHEN 325 MG PO TABS: 650.0000 mg | ORAL_TABLET | Freq: Once | ORAL | Status: DC

## 2019-09-28 MED ORDER — ACETAMINOPHEN 325 MG PO TABS: 650.0000 mg | ORAL_TABLET | Freq: Four times a day (QID) | ORAL | Status: DC | PRN

## 2019-09-28 MED ORDER — ONDANSETRON HCL 4 MG/2ML IJ SOLN: 4.0000 mg | Freq: Four times a day (QID) | INTRAMUSCULAR | Status: DC | PRN

## 2019-09-28 MED ORDER — SODIUM CHLORIDE 0.9 % IV SOLN: 500.0000 mg | Freq: Once | INTRAVENOUS | Status: DC

## 2019-09-28 MED ORDER — METHYLPREDNISOLONE SODIUM SUCC 125 MG IJ SOLR
125.0000 mg | Freq: Once | INTRAMUSCULAR | Status: AC
  Administered 2019-09-28: 125 mg via INTRAVENOUS
  Filled 2019-09-28: qty 2

## 2019-09-28 MED ORDER — VANCOMYCIN HCL 750 MG/150ML IV SOLN: 750.0000 mg | INTRAVENOUS | Status: DC

## 2019-09-28 MED ORDER — ACETAMINOPHEN 650 MG RE SUPP: 650.0000 mg | Freq: Four times a day (QID) | RECTAL | Status: DC | PRN

## 2019-09-28 MED ORDER — QUETIAPINE FUMARATE 25 MG PO TABS
12.5000 mg | ORAL_TABLET | Freq: Every day | ORAL | Status: DC
  Administered 2019-09-29: 12.5 mg via ORAL
  Filled 2019-09-28 (×2): qty 1

## 2019-09-28 NOTE — Telephone Encounter (Signed)
I think it makes sense for him to get checked out sooner rather than later, especially given the fever.  My concern is that he could significantly worsen overnight.  I do not have a known cause for the fever and I am hesitant to start antibiotics blindly without getting additional information first.  I think it makes sense to try to get him seen this evening either urgent care or the emergency room.

## 2019-09-28 NOTE — Telephone Encounter (Signed)
I spoke with [name] [name] (DPR signed) pt did not go to ED last night; EMT's came out and thought vitals were better; BP 101/80 P 98 when taken by EMTs; pt did not go to ED. Now pt is resting and pt does not want to get up; O2 sat is 96% pt is on continuous O2  At 3.5 L. Pt does not appear to have SOB with O2 going. No CP,H/A or dizziness. Last few days when first getting pt up pt felt lightheaded; after few mins he felt OK. Pt is not able to sit for long period of times. Pt is not confused. Pt does not have UTI symptoms. Pt has not had BP med since 08/22/19 due to BP being low. Pt is resting now. Last night changed oxygen from nasal cannula to mask and vitals got better after that. BP now is 162/88 P 80. Pulse ox is 95%. [name] [name] does not think pt is in any distress; pt is not breathing heavy and is resting now. Palliative nurse is coming out to see pt on 09/29/19. [name] [name] request cb after Dr Damita Dunnings reviews note. ED precautions given and [name] [name] voiced understanding.

## 2019-09-28 NOTE — Telephone Encounter (Signed)
Pt is now running a fever, checking axillary - 99.1-100.1 BP fluctuating 150/80s, Pulse 80s, O2 93-96%  Daughter is still concerned with taking him to the hospital  She is worried that they may try to admit him  The Palliative Nurse Is supposed to come out tomorrow 09/29/19 at 12:30 She has not reached out to the Palliative Nurse yet - wanted recommendations from Dr Damita Dunnings first.

## 2019-09-28 NOTE — Telephone Encounter (Signed)
If he clearly feels better and has reasonable vitals (and he does based on the note below), then I think it makes sense to observe at home for now.  I think if palliative nurse is able to see patient tomorrow, and if patient's status doesn't change in the meantime, then I wouldn't change anything else now.  I think ER cautions in the meantime make sense.  Thanks.

## 2019-09-28 NOTE — ED Triage Notes (Signed)
Pt bib ems from home with reports of ams X3 days. Pt febrile at 101F. EMS also reports pt with dark urine for a few days. Pt somnolent on arrival. Hx copd. Pt on palliative per ems.

## 2019-09-28 NOTE — Progress Notes (Signed)
Pharmacy Antibiotic Note  [name] [name]. is a 86 y.o. male admitted on 09/28/2019 with sepsis possibly 2/2 PNA.  Pharmacy has been consulted for Vancomycin and Cefepime dosing. Pt was febrile PTA but now afebrile. WBC wnl, lactate 0.9, Scr 1.17.  Plan: Vancomycin 1250mg  IV once, then 750mg  IV Q24 hrs (Est AUC 501.7, Scr 1.17, wt 62 kg) Cefepime 2g IV Q12 hrs Monitor renal function, cultures/sensitivities, clinical progression Check vancomycin levels as indicated     Temp (24hrs), Avg:99.9 F (37.7 C), Min:99.9 F (37.7 C), Max:99.9 F (37.7 C)  Recent Labs  Lab 09/28/19 1917  WBC 6.9  CREATININE 1.17  LATICACIDVEN 0.9    CrCl cannot be calculated (Unknown ideal weight.).    Allergies  Allergen Reactions  . Codeine Nausea And Vomiting    Antimicrobials this admission: Vancomycin 4/15 >>  Cefepime 4/15 >>   Dose adjustments this admission: N/A  Microbiology results: 4/15 BCx:    Richardine Service, PharmD PGY1 Pharmacy Resident Phone: [phone] 09/28/2019  8:19 PM  Please check [email or web address] for unit-specific pharmacy phone numbers.

## 2019-09-28 NOTE — Telephone Encounter (Signed)
Daughter advised.

## 2019-09-28 NOTE — H&P (Signed)
History and Physical    [name] Rauth Sr. [id number] DOB: 08/03/1924 DOA: 09/28/2019  PCP: Tonia Ghent, MD  Patient coming from: Home via EMS  I have personally briefly reviewed patient's old medical records in [place]  Chief Complaint: Altered mental status, shortness of breath  HPI: [name] Antoniewicz Sr. is a 86 y.o. male with medical history significant for COPD, chronic respiratory failure, bradycardia s/p PPM 06/15/2019, hypertension, hypothyroidism, chronic diastolic CHF (EF 0000000), dysphagia with aspiration, and sacral wound who presents to the ED for evaluation of change in mental status and shortness of breath at home.  Patient unable to provide history therefore entirety history is obtained from daughter at bedside, EDP, and chart review.    Patient was admitted at [facility] in January 2021 for COVID-19 pneumonia and was treated with IV remdesivir and steroids.  Since that time he has had significant deconditioning and has been nonambulatory and requiring the use of a wheelchair.  Over the last several days daughter has noticed patient's heart rate has been elevated.  He has had decreased appetite and level of interaction.  He is less verbal.  He has appeared short of breath with cough productive of thick white sputum.  He has required use of supplemental oxygen years ago but not recently.  She noted his oxygen level was low when placed him on O2 via [place].  He had a high temperature of 100.2 Fahrenheit axillary at home.  He has had decreased urine output today.  He has not had any obvious diaphoresis, chest pain, nausea, vomiting, abdominal pain, diarrhea.  ED Course:  Initial vitals showed BP 141/91, pulse 92, RR 20, temp 99.9 Fahrenheit rectally, SPO2 90% on 4 L supplemental O2 via [place].  Labs are notable for WBC 6.9, hemoglobin 10.2, platelets 183,000, sodium 133, potassium 3.5, bicarb 23, BUN 37, creatinine 1.17, LFTs within normal limits, lactic acid  0.9.  Blood cultures were obtained and pending.  SARS-CoV-2 PCR and influenza A/B PCR's are negative.  Portable chest x-ray shows pacemaker in place left chest wall, chronic scarring within the lungs, without acute airspace disease, effusion, or pneumothorax.  Patient was given IV Solu-Medrol 125 mg, 1 L normal saline, and IV vancomycin/cefepime.  The hospitalist service was consulted to admit for further evaluation and management.  Review of Systems:  Unable to obtain full review of systems due to patient's altered mental status.   Past Medical History:  Diagnosis Date  . Anemia   . Bradycardia   . COPD (chronic obstructive pulmonary disease) (HCC)    Mild  . HTN (hypertension)   . Hypothyroidism     Past Surgical History:  Procedure Laterality Date  . CATARACT EXTRACTION Bilateral   . GALLBLADDER SURGERY  02/21/2011  . OTHER SURGICAL HISTORY     finger right pointer--laceration repair  . PACEMAKER INSERTION     [facility]   . TOTAL HIP ARTHROPLASTY      Social History:  reports that he quit smoking about 48 years ago. His smoking use included cigarettes. He has a 37.50 pack-year smoking history. He has never used smokeless tobacco. He reports that he does not drink alcohol or use drugs.  Allergies  Allergen Reactions  . Codeine Nausea And Vomiting    Family History  Problem Relation Age of Onset  . Other Father        enlarged heart  . Lung disease Mother   . COPD Sister   . Heart disease Sister  Pacemaker  . Lung cancer Sister   . Colon cancer Neg Hx      Prior to Admission medications   Medication Sig Start Date End Date Taking? Authorizing Provider  acetaminophen (TYLENOL) 325 MG tablet Take 650 mg by mouth 2 (two) times daily as needed (for pain).   Yes [provider]  albuterol (PROVENTIL) (2.5 MG/3ML) 0.083% nebulizer solution Take 3 mLs (2.5 mg total) by nebulization every 6 (six) hours as needed for wheezing or shortness of breath (dx  J43.2). 05/24/19  Yes Parrett, Tammy S, NP  albuterol (VENTOLIN HFA) 108 (90 Base) MCG/ACT inhaler Inhale 2 puffs into the lungs every 6 (six) hours as needed for shortness of breath or wheezing. 06/30/19  Yes [provider]  ferrous sulfate 325 (65 FE) MG EC tablet Take 325 mg by mouth daily with breakfast.   Yes [provider]  Fluticasone-Umeclidin-Vilant 100-62.5-25 MCG/INH AEPB Take 1 puff by mouth daily. 03/30/19  Yes Parrett, Tammy S, NP  guaiFENesin (ROBITUSSIN) 100 MG/5ML liquid Take 10 mLs (200 mg total) by mouth 2 (two) times daily. 07/18/19  Yes Tonia Ghent, MD  levothyroxine (SYNTHROID) 100 MCG tablet Take 1 tablet (100 mcg total) by mouth daily before breakfast. 07/18/19  Yes Tonia Ghent, MD  omeprazole (PRILOSEC) 40 MG capsule TAKE 1 CAPSULE BY MOUTH EVERY DAY Patient taking differently: Take 40 mg by mouth daily.  07/18/19  Yes Tonia Ghent, MD  polyethylene glycol [facility] / Floria Raveling) packet Take 17 g by mouth daily as needed for mild constipation. 08/23/18  Yes Tonia Ghent, MD  QUEtiapine (SEROQUEL) 25 MG tablet 0.5 TABLET BY MOUTH AT BEDTIME Patient taking differently: Take 12.5 mg by mouth at bedtime. 0.5 TABLET BY MOUTH AT BEDTIME 09/15/19  Yes Tonia Ghent, MD  Calcium Carb-Cholecalciferol (CALCIUM-VITAMIN D) 500-200 MG-UNIT tablet Take 1 tablet by mouth daily. Patient not taking: Reported on 09/28/2019 08/23/18   Tonia Ghent, MD  hydrALAZINE (APRESOLINE) 50 MG tablet Take 0.5 tablets (25 mg total) by mouth 3 (three) times daily as needed (use if BP is >140/>90). Patient not taking: Reported on 09/28/2019 07/18/19   Tonia Ghent, MD  potassium chloride SA (KLOR-CON) 20 MEQ tablet Take 2 tablets (40 mEq total) by mouth daily for 5 doses. Patient not taking: Reported on 09/28/2019 07/11/19 07/18/19  Jonnie Finner, DO  Respiratory Therapy Supplies (FLUTTER) DEVI Use as directed 07/26/18   Juanito Doom, MD    Physical Exam: Vitals:    09/28/19 1917 09/28/19 1918 09/28/19 1951  BP:  (!) 141/91   Pulse: 91    Resp: 20    Temp:   99.9 F (37.7 C)  TempSrc:   Rectal  SpO2: 90%     Constitutional: Chronically ill-appearing elderly man resting supine in bed while on nonrebreather, NAD, calm, comfortable Eyes: PERRL, lids and conjunctivae normal ENMT: Mucous membranes are dry. Posterior pharynx clear of any exudate or lesions.edentulous Neck: normal, supple, no masses. Respiratory: Coarse rhonchi bilateral lung fields.. Normal respiratory effort. No accessory muscle use.  Cardiovascular: Regular rate and rhythm, no murmurs / rubs / gallops. No extremity edema. 2+ pedal pulses.  PPM in place left chest wall without overlying erythema or warmth to touch. Abdomen: no tenderness, no masses palpated. No hepatosplenomegaly. Musculoskeletal: no clubbing / cyanosis.  Right hip in flexion position, chronic per daughter.  Moving upper extremities spontaneously. Skin: Sacral ulcer as pictured below. Neurologic: Limited due to mental status, sensation appears intact,  moving extremities spontaneously Psychiatric: Awake and alert but mostly nonverbal      Labs on Admission: I have personally reviewed following labs and imaging studies  CBC: Recent Labs  Lab 09/28/19 1917  WBC 6.9  NEUTROABS 4.6  HGB 10.2*  HCT 32.3*  MCV 97.6  PLT [id number]   Basic Metabolic Panel: Recent Labs  Lab 09/28/19 1917  NA 153*  K 3.5  CL 118*  CO2 23  GLUCOSE 90  BUN 37*  CREATININE 1.17  CALCIUM 8.5*   GFR: CrCl cannot be calculated (Unknown ideal weight.). Liver Function Tests: Recent Labs  Lab 09/28/19 1917  AST 23  ALT 11  ALKPHOS 60  BILITOT 0.9  PROT 6.4*  ALBUMIN 2.4*   No results for input(s): LIPASE, AMYLASE in the last 168 hours. No results for input(s): AMMONIA in the last 168 hours. Coagulation Profile: Recent Labs  Lab 09/28/19 1917  INR 1.2   Cardiac Enzymes: No results for input(s): CKTOTAL, CKMB, CKMBINDEX,  TROPONINI in the last 168 hours. BNP (last 3 results) No results for input(s): PROBNP in the last 8760 hours. HbA1C: No results for input(s): HGBA1C in the last 72 hours. CBG: No results for input(s): GLUCAP in the last 168 hours. Lipid Profile: No results for input(s): CHOL, HDL, LDLCALC, TRIG, CHOLHDL, LDLDIRECT in the last 72 hours. Thyroid Function Tests: No results for input(s): TSH, T4TOTAL, FREET4, T3FREE, THYROIDAB in the last 72 hours. Anemia Panel: No results for input(s): VITAMINB12, FOLATE, FERRITIN, TIBC, IRON, RETICCTPCT in the last 72 hours. Urine analysis:    Component Value Date/Time   COLORURINE YELLOW 07/06/2019 2201   APPEARANCEUR CLEAR 07/06/2019 2201   LABSPEC 1.034 (H) 07/06/2019 2201   PHURINE 5.0 07/06/2019 2201   GLUCOSEU NEGATIVE 07/06/2019 2201   GLUCOSEU NEGATIVE 05/26/2018 1716   HGBUR MODERATE (A) 07/06/2019 2201   BILIRUBINUR NEGATIVE 07/06/2019 2201   KETONESUR 5 (A) 07/06/2019 2201   PROTEINUR NEGATIVE 07/06/2019 2201   UROBILINOGEN 0.2 05/26/2018 1716   NITRITE NEGATIVE 07/06/2019 2201   LEUKOCYTESUR NEGATIVE 07/06/2019 2201    Radiological Exams on Admission: DG Chest Port 1 View  Result Date: 09/28/2019 CLINICAL DATA:  Altered level of consciousness, fever, somnolent EXAM: PORTABLE CHEST 1 VIEW COMPARISON:  11/10/2018 FINDINGS: Single frontal view of the chest demonstrates dual lead pacemaker overlying left chest, proximal lead overlying right atrium and distal lead excluded by collimation. The cardiac silhouette is unremarkable. There is ectasia of the thoracic aorta. Chronic scarring within the lungs, without airspace disease, effusion, or pneumothorax. No acute bony abnormalities. IMPRESSION: 1. No acute intrathoracic process. Electronically Signed   By: Randa Ngo M.D.   On: 09/28/2019 20:06    EKG: Independently reviewed. V paced rhythm.  Prior EKG from 2019 showed sinus bradycardia with RBBB.  Assessment/Plan Principal Problem:    Acute on chronic respiratory failure with hypoxia (HCC) Active Problems:   COPD (chronic obstructive pulmonary disease) with emphysema (HCC)   HTN (hypertension)   Hypothyroidism   Chronic diastolic heart failure (HCC)   Sacral ulcer ([place])  Tallon Finkle Sr. is a 86 y.o. male with medical history significant for COPD, chronic respiratory failure, bradycardia s/p PPM 06/15/2019, hypertension, hypothyroidism, chronic diastolic CHF (EF 0000000), dysphagia with aspiration, and sacral wound who is admitted with acute on chronic respiratory failure with hypoxia.   Acute on chronic respiratory failure with hypoxia COPD: Likely due to increased mucus production and secretion without adequate clearance and background of COPD.  May be some element of  acute COPD exacerbation as well.  Chest x-ray has no evidence of pneumonia.  Currently oxygenating well on nonrebreather, daughter states that nasal cannula has not been as effective due to mouth breathing. -DC vancomycin and cefepime -Start oral prednisone and azithromycin -Continue supplemental oxygen and wean to nasal cannula as able -Start Mucinex -Continue fluticasone-Umeclidinium-Vilant inhaler daily, albuterol nebulizer as needed -Continue oral care and suctioning as needed -Continue IV hydration overnight  Hypernatremia: Due to dehydration.  Stop normal saline and start D5W @ 100 mL/hr overnight.  Repeat labs in a.m.  Bradycardia s/p PPM: Chronic and stable with V paced rhythm.  Chronic diastolic CHF: Volume depleted on admission.  On IV fluid hydration as above.  Not on diuretics as an outpatient.  Hypertension: Off antihypertensives.  BP mildly elevated on admission.  Hypothyroidism: Continue Synthroid.  Dysphagia: Patient with chronic dysphagia and aspiration.  Keep n.p.o. except for sips with meds for now.  Request SLP eval.  Continue aspiration precautions.  Sacral ulcer: Chronic and appears stable per daughter.  Consult to  wound care.  Insomnia: Continue Seroquel at night.  DVT prophylaxis: Subcutaneous heparin Code Status: DNR, confirmed with daughter at bedside Family Communication: Discussed with daughter at bedside and son on speaker phone Disposition Plan: From home, family wants to return to home with continued palliative care Consults called: None Admission status:  Status is: Observation  The patient remains OBS appropriate and will d/c before 2 midnights.  Dispo: The patient is from: Home              Anticipated d/c is to: Home              Anticipated d/c date is: 1 day              Patient currently is not medically stable to d/c.   Zada Finders MD [facility]  If 7PM-7AM, please contact night-coverage [email or web address]  09/28/2019, 9:55 PM

## 2019-09-28 NOTE — Telephone Encounter (Signed)
[place] Night - Client TELEPHONE ADVICE RECORD [facility] Patient Name: [name] [name] Gender: Male DOB: Feb 06, 1925 Age: 86 Y 3 M 18 D Return Phone Number: [phone] (Primary), [phone] (Secondary) Address: City/State/Zip: [place] [address] Client [place] Night - Client Client Site [place] Physician Renford Dills - MD Contact Type Call Who Is Calling Patient / Member / Family / Caregiver Call Type Triage / Clinical Caller Name Willia Craze Relationship To Patient Daughter Return Phone Number [phone] (Primary) Chief Complaint BREATHING - fast, heavy or wheezing Reason for Call Symptomatic / Request for Health Information Initial Comment caller states her father oxygen level is low with high heart rate 111 and bp 152/125 - heavy breathing and not very responsive Translation No Nurse Assessment Nurse: Windle Guard, RN, Lesa Date/Time (Eastern Time): 09/27/2019 6:57:41 PM Confirm and document reason for call. If symptomatic, describe symptoms. ---Caller states her father's O2 level is between 87-90%. He is on 3.5 L/min. His blood pressure is 152/125 and his heart rate is 106. He is having difficulty breathing Has the patient had close contact with a person known or suspected to have the novel coronavirus illness OR traveled / lives in area with major community spread (including international travel) in the last 14 days from the onset of symptoms? * If Asymptomatic, screen for exposure and travel within the last 14 days. ---No Does the patient have any new or worsening symptoms? ---Yes Will a triage be completed? ---Yes Related visit to physician within the last 2 weeks? ---No Does the PT have any chronic conditions? (i.e. diabetes, asthma, this includes High risk factors for pregnancy, etc.) ---Yes List chronic conditions. ---COPD, pacemaker Is this a behavioral health or  substance abuse call? ---No Guidelines Guideline Title Affirmed Question Affirmed Notes Nurse Date/Time ([place] Time) COVID-19 - Diagnosed or Suspected SEVERE difficulty breathing (e.g., struggling for each breath, speaks in single words) Conner, RN, Lesa 09/27/2019 7:01:01 PM PLEASE NOTE: All timestamps contained within this report are represented as [place] Standard Time. CONFIDENTIALTY NOTICE: This fax transmission is intended only for the addressee. It contains information that is legally privileged, confidential or otherwise protected from use or disclosure. If you are not the intended recipient, you are strictly prohibited from reviewing, disclosing, copying using or disseminating any of this information or taking any action in reliance on or regarding this information. If you have received this fax in error, please notify us immediately by telephone so that we can arrange for its return to [place]. Phone: [phone], Toll-Free: [phone], Fax: [phone] Page: 2 of 2 Call Id: [id number] [place]. Time Eilene Ghazi Time) Disposition Final User 09/27/2019 6:56:07 PM Send to Urgent Leda Roys 09/27/2019 7:03:46 PM Send To RN Personal Conner, RN, Lesa 09/27/2019 7:12:47 PM 911 Outcome Documentation Conner, RN, Emmaline Kluver Reason: Unable to reach the caller 09/27/2019 7:02:09 PM Call EMS 911 Now Yes Conner, RN, Lesa Caller Disagree/Comply Comply Caller Understands Yes PreDisposition Did not know what to do Care Advice Given Per Guideline * When you call 911, tell the dispatcher that you probably have COVID-19. TELL THE AMBULANCE DISPATCHER ABOUT COVID-19 DIAGNOSIS: * Immediate medical attention is needed. You need to hang up and call 911 (or an ambulance). CALL EMS 911 NOW: TELL AMBULANCE MEDICS ABOUT YOUR COVID-19 DIAGNOSIS: * Tell the paramedic right away that you probably have COVID-19. CARE ADVICE given per COVID-19 - DIAGNOSED OR SUSPECTED (Adult) guideline. Comments User: Starleen Blue, RN Date/Time Eilene Ghazi  Time): 09/27/2019 7:02:43 PM Caller hung up prior to closing script

## 2019-09-28 NOTE — ED Notes (Signed)
Attempted in and out unsuccessful, pt placed on primofit cath to wait for urine sample

## 2019-09-28 NOTE — ED Provider Notes (Signed)
[facility] EMERGENCY DEPARTMENT Provider Note   CSN: [id number] Arrival date & time: 09/28/19  1856     History Chief Complaint  Patient presents with  . Fever  . Altered Mental Status    [name] Berding Sr. is a 86 y.o. male.  HPI   Patient is a 86 year old male with a history of anemia, bradycardia, COPD, hypertension, hypothyroidism, who presents the emergency department today with his daughter for evaluation of a fever.  There is a level 5 caveat as patient is only answering yes/no questions and does not provide any further history  Daughter states the patient has had a fever for the last 24 hours.  She further states that he has had a bit of a cough and has also been requiring oxygen at home.  He does have COPD and has as needed oxygen at home but he has not required it in several years.  She has been using 2 to 3.5 L for the last several days.  He also has a sacral wound which does not appear to be significantly changed from baseline other than being slightly red from laying on his back all day today.  He has not had any vomiting.  He has had normal stools.  She states that his mental status is somewhat changed today because he is not as talkative and he is not eating and drinking normally.  She states that he was diagnosed with Covid January 2021.  Past Medical History:  Diagnosis Date  . Anemia   . Bradycardia   . COPD (chronic obstructive pulmonary disease) (HCC)    Mild  . HTN (hypertension)   . Hypothyroidism     Patient Active Problem List   Diagnosis Date Noted  . Acute on chronic respiratory failure with hypoxia ([place]) 09/28/2019  . Hypernatremia 09/28/2019  . Bradycardia 07/23/2019  . Physical deconditioning 07/23/2019  . Pressure injury of skin 07/10/2019  . HCAP (healthcare-associated pneumonia) 07/06/2019  . Skin lesion 12/28/2018  . Sacral ulcer ([place]) 10/17/2018  . H/O laceration of skin 08/24/2018  . Left knee pain 08/24/2018  .  Oropharyngeal dysphagia 05/03/2018  . Advance care planning 05/02/2018  . Protein-calorie malnutrition, severe 03/29/2018  . Altered mental status   . Acute encephalopathy 03/22/2018  . Hypothyroidism 03/22/2018  . Pneumonia 03/10/2018  . Unsteady gait 01/12/2018  . HTN (hypertension) 01/12/2018  . Hyponatremia 01/12/2018  . Status post hip hemiarthroplasty 12/28/2017  . Chronic diastolic heart failure ([place]) 12/06/2017  . GERD (gastroesophageal reflux disease) 12/06/2017  . Aspiration into airway 02/17/2017  . Osteopenia 01/05/2017  . Anemia, iron deficiency 09/13/2015  . Fall 07/23/2015  . Herpes zoster 04/01/2015  . Chronic back pain 01/21/2015  . Spondylosis of lumbar region without myelopathy or radiculopathy 01/21/2015  . DDD (degenerative disc disease), lumbar 11/26/2014  . Thrombocytopenic ([place]) 09/19/2014  . Normocytic normochromic anemia 12/25/2013  . Solitary pulmonary nodule 03/08/2013  . Diastolic dysfunction [id number]  . Chronic cough 08/26/2012  . COPD (chronic obstructive pulmonary disease) with emphysema ([place]) 08/12/2012  . Chronic respiratory failure ([place]) 08/12/2012  . Benign prostatic hyperplasia 03/18/2011    Past Surgical History:  Procedure Laterality Date  . CATARACT EXTRACTION Bilateral   . GALLBLADDER SURGERY  02/21/2011  . OTHER SURGICAL HISTORY     finger right pointer--laceration repair  . PACEMAKER INSERTION     at [facility]   . TOTAL HIP ARTHROPLASTY         Family History  Problem Relation Age of  Onset  . Other Father        enlarged heart  . Lung disease Mother   . COPD Sister   . Heart disease Sister        Pacemaker  . Lung cancer Sister   . Colon cancer Neg Hx     Social History   Tobacco Use  . Smoking status: Former Smoker    Packs/day: 1.50    Years: 25.00    Pack years: 37.50    Types: Cigarettes    Quit date: 06/16/1971    Years since quitting: 48.3  . Smokeless tobacco: Never Used  . Tobacco comment: started at 86  years old  Substance Use Topics  . Alcohol use: No  . Drug use: No    Home Medications Prior to Admission medications   Medication Sig Start Date End Date Taking? Authorizing Provider  acetaminophen (TYLENOL) 325 MG tablet Take 650 mg by mouth 2 (two) times daily as needed (for pain).   Yes [provider]  albuterol (PROVENTIL) (2.5 MG/3ML) 0.083% nebulizer solution Take 3 mLs (2.5 mg total) by nebulization every 6 (six) hours as needed for wheezing or shortness of breath (dx J43.2). 05/24/19  Yes Parrett, Tammy S, NP  albuterol (VENTOLIN HFA) 108 (90 Base) MCG/ACT inhaler Inhale 2 puffs into the lungs every 6 (six) hours as needed for shortness of breath or wheezing. 06/30/19  Yes [provider]  ferrous sulfate 325 (65 FE) MG EC tablet Take 325 mg by mouth daily with breakfast.   Yes [provider]  Fluticasone-Umeclidin-Vilant 100-62.5-25 MCG/INH AEPB Take 1 puff by mouth daily. 03/30/19  Yes Parrett, Tammy S, NP  guaiFENesin (ROBITUSSIN) 100 MG/5ML liquid Take 10 mLs (200 mg total) by mouth 2 (two) times daily. 07/18/19  Yes Tonia Ghent, MD  levothyroxine (SYNTHROID) 100 MCG tablet Take 1 tablet (100 mcg total) by mouth daily before breakfast. 07/18/19  Yes Tonia Ghent, MD  omeprazole (PRILOSEC) 40 MG capsule TAKE 1 CAPSULE BY MOUTH EVERY DAY Patient taking differently: Take 40 mg by mouth daily.  07/18/19  Yes Tonia Ghent, MD  polyethylene glycol [facility] Floria Raveling) packet Take 17 g by mouth daily as needed for mild constipation. 08/23/18  Yes Tonia Ghent, MD  QUEtiapine (SEROQUEL) 25 MG tablet 0.5 TABLET BY MOUTH AT BEDTIME Patient taking differently: Take 12.5 mg by mouth at bedtime. 0.5 TABLET BY MOUTH AT BEDTIME 09/15/19  Yes Tonia Ghent, MD  Calcium Carb-Cholecalciferol (CALCIUM-VITAMIN D) 500-200 MG-UNIT tablet Take 1 tablet by mouth daily. Patient not taking: Reported on 09/28/2019 08/23/18   Tonia Ghent, MD  hydrALAZINE (APRESOLINE)  50 MG tablet Take 0.5 tablets (25 mg total) by mouth 3 (three) times daily as needed (use if BP is >140/>90). Patient not taking: Reported on 09/28/2019 07/18/19   Tonia Ghent, MD  potassium chloride SA (KLOR-CON) 20 MEQ tablet Take 2 tablets (40 mEq total) by mouth daily for 5 doses. Patient not taking: Reported on 09/28/2019 07/11/19 07/18/19  Jonnie Finner, DO  Respiratory Therapy Supplies (FLUTTER) DEVI Use as directed 07/26/18   Juanito Doom, MD    Allergies    Codeine  Review of Systems   Review of Systems  Unable to perform ROS: Patient nonverbal  Constitutional: Positive for fever.  Respiratory: Positive for cough.        Hypoxia  Gastrointestinal: Negative for anal bleeding and vomiting.  Skin: Positive for wound.    Physical Exam  Updated Vital Signs BP (!) 180/147 (BP Location: Left Arm)   Pulse 81   Temp 99.9 F (37.7 C) (Rectal)   Resp 20   SpO2 94%   Physical Exam Vitals and nursing note reviewed.  Constitutional:      Appearance: He is well-developed.  HENT:     Head: Normocephalic and atraumatic.  Eyes:     Conjunctiva/sclera: Conjunctivae normal.  Cardiovascular:     Rate and Rhythm: Normal rate.     Heart sounds: Normal heart sounds. No murmur.  Pulmonary:     Effort: Pulmonary effort is normal. No respiratory distress.     Breath sounds: Examination of the right-middle field reveals rales. Wheezing (diffuse (R>L)) and rales present.  Abdominal:     General: Bowel sounds are normal.     Palpations: Abdomen is soft.     Tenderness: There is no abdominal tenderness. There is no guarding or rebound.  Musculoskeletal:     Cervical back: Neck supple.  Skin:    General: Skin is warm and dry.     Comments: Sacral wound noted below. Erythema noted but no induration, fluctuance, crepitus.   Neurological:     Mental Status: He is alert.     Comments: Nods yes and no to questions.        ED Results / Procedures / Treatments   Labs (all labs  ordered are listed, but only abnormal results are displayed) Labs Reviewed  COMPREHENSIVE METABOLIC PANEL - Abnormal; Notable for the following components:      Result Value   Sodium 153 (*)    Chloride 118 (*)    BUN 37 (*)    Calcium 8.5 (*)    Total Protein 6.4 (*)    Albumin 2.4 (*)    GFR calc non Af Amer 54 (*)    All other components within normal limits  CBC WITH DIFFERENTIAL/PLATELET - Abnormal; Notable for the following components:   RBC 3.31 (*)    Hemoglobin 10.2 (*)    HCT 32.3 (*)    All other components within normal limits  RESPIRATORY PANEL BY RT PCR (FLU A&B, COVID)  CULTURE, BLOOD (ROUTINE X 2)  CULTURE, BLOOD (ROUTINE X 2)  MRSA PCR SCREENING  LACTIC ACID, PLASMA  PROTIME-INR  LACTIC ACID, PLASMA  URINALYSIS, ROUTINE W REFLEX MICROSCOPIC  CBC  BASIC METABOLIC PANEL    EKG EKG Interpretation  Date/Time:  Thursday September 28 2019 19:04:39 EDT Ventricular Rate:  91 PR Interval:    QRS Duration: 169 QT Interval:  444 QTC Calculation: 547 R Axis:   -78 Text Interpretation: Sinus rhythm Left bundle branch block vs V paced Artifact in lead(s) I II aVR aVL Confirmed by Aletta Edouard [phone]) on 09/28/2019 7:12:33 PM   Radiology DG Chest Port 1 View  Result Date: 09/28/2019 CLINICAL DATA:  Altered level of consciousness, fever, somnolent EXAM: PORTABLE CHEST 1 VIEW COMPARISON:  11/10/2018 FINDINGS: Single frontal view of the chest demonstrates dual lead pacemaker overlying left chest, proximal lead overlying right atrium and distal lead excluded by collimation. The cardiac silhouette is unremarkable. There is ectasia of the thoracic aorta. Chronic scarring within the lungs, without airspace disease, effusion, or pneumothorax. No acute bony abnormalities. IMPRESSION: 1. No acute intrathoracic process. Electronically Signed   By: Randa Ngo M.D.   On: 09/28/2019 20:06    Procedures Procedures (including critical care time) CRITICAL CARE Performed by:  Rodney Booze   Total critical care time: 40 minutes  Critical  care time was exclusive of separately billable procedures and treating other patients.  Critical care was necessary to treat or prevent imminent or life-threatening deterioration.  Critical care was time spent personally by me on the following activities: development of treatment plan with patient and/or surrogate as well as nursing, discussions with consultants, evaluation of patient's response to treatment, examination of patient, obtaining history from patient or surrogate, ordering and performing treatments and interventions, ordering and review of laboratory studies, ordering and review of radiographic studies, pulse oximetry and re-evaluation of patient's condition.   Medications Ordered in ED Medications  heparin injection 5,000 Units (has no administration in time range)  dextrose 5 % solution (has no administration in time range)  acetaminophen (TYLENOL) tablet 650 mg (has no administration in time range)    Or  acetaminophen (TYLENOL) suppository 650 mg (has no administration in time range)  ondansetron (ZOFRAN) tablet 4 mg (has no administration in time range)    Or  ondansetron (ZOFRAN) injection 4 mg (has no administration in time range)  guaiFENesin (MUCINEX) 12 hr tablet 600 mg (has no administration in time range)  albuterol (PROVENTIL) (2.5 MG/3ML) 0.083% nebulizer solution 2.5 mg (has no administration in time range)  Fluticasone-Umeclidin-Vilant 100-62.5-25 MCG/INH AEPB 1 puff (has no administration in time range)  levothyroxine (SYNTHROID) tablet 100 mcg (has no administration in time range)  pantoprazole (PROTONIX) EC tablet 40 mg (has no administration in time range)  polyethylene glycol (MIRALAX / GLYCOLAX) packet 17 g (has no administration in time range)  QUEtiapine (SEROQUEL) tablet 12.5 mg (has no administration in time range)  predniSONE (DELTASONE) tablet 40 mg (has no administration in time  range)  azithromycin (ZITHROMAX) tablet 500 mg (has no administration in time range)    Followed by  azithromycin (ZITHROMAX) tablet 250 mg (has no administration in time range)  methylPREDNISolone sodium succinate (SOLU-MEDROL) 125 mg/2 mL injection 125 mg (125 mg Intravenous Given 09/28/19 2028)  vancomycin (VANCOREADY) IVPB 1250 mg/250 mL (1,250 mg Intravenous New Bag/Given 09/28/19 2135)  acetaminophen (TYLENOL) suppository 650 mg (650 mg Rectal Given 09/28/19 2027)  sodium chloride 0.9 % bolus 1,000 mL (1,000 mLs Intravenous New Bag/Given 09/28/19 2035)    ED Course  I have reviewed the triage vital signs and the nursing notes.  Pertinent labs & imaging results that were available during my care of the patient were reviewed by me and considered in my medical decision making (see chart for details).  Clinical Course as of Sep 27 2317  Thu Sep 28, 6538  5547 86 year old male palliative care here for altered mental status and fever from home.  Patient is somnolent.  On a nonrebreather.  Is getting IV antibiotics and fluids.  Will need admission for further management.   [MB]    Clinical Course User Index [MB] Hayden Rasmussen, MD   MDM Rules/Calculators/A&P                      86 year old male presenting for evaluation of fever, hypoxia.  Fever noted within the last 24 hours but hypoxia has been ongoing for the last several days.  He is not normally on oxygen therapy at home.  He had Covid infection January 2021.  Patient with wheezing and or rales noted to the right mid and lower lung fields.  He is coughing on exam.  Abdomen is soft and nontender.  Heart with regular rate and rhythm.  Sacral wound was visualized and was somewhat erythematous however this is  felt to be secondary to pressure rather than from acute infection.  Patient is borderline febrile with a temp of 99.9.  Heart rate 91.  He is not hypotensive.  He was initially maintaining sats at 95% on 5 to 6 L O2 however is now  requiring a nonrebreather.  Cbc w/o leukocytosis, mild anemia cmo with hypernatremia, elevated BUN, Cr baseline  - hypernatremia likely 2/2 hypovolemia in setting of decreased PO intake. Will start with 1l ns. Lactic acid neg Blood cultures obtained coags wnl  cxr neg however clinically pt has copd exacerbation vs developing pneumonia  - tx with broad spectrum abx given recent admission and h/o copd  Pt also given solumedrol and tylenol.  Will admit for further tx of acute respiratory failure with hypoxia as well as hypernatremia  9:46 PM CONSULT with Dr. Posey Pronto with hospitalist service who accepts patient for admission.   Final Clinical Impression(s) / ED Diagnoses Final diagnoses:  Acute hypoxemic respiratory failure ([place])  Hypernatremia    Rx / DC Orders ED Discharge Orders    None       Bishop Dublin 09/28/19 2319    Hayden Rasmussen, MD 09/29/19 [id number]

## 2019-09-29 DIAGNOSIS — J441 Chronic obstructive pulmonary disease with (acute) exacerbation: Secondary | ICD-10-CM | POA: Diagnosis not present

## 2019-09-29 DIAGNOSIS — E43 Unspecified severe protein-calorie malnutrition: Secondary | ICD-10-CM | POA: Diagnosis not present

## 2019-09-29 DIAGNOSIS — Z96649 Presence of unspecified artificial hip joint: Secondary | ICD-10-CM | POA: Diagnosis present

## 2019-09-29 DIAGNOSIS — G4700 Insomnia, unspecified: Secondary | ICD-10-CM | POA: Diagnosis present

## 2019-09-29 DIAGNOSIS — I5032 Chronic diastolic (congestive) heart failure: Secondary | ICD-10-CM | POA: Diagnosis not present

## 2019-09-29 DIAGNOSIS — G9341 Metabolic encephalopathy: Secondary | ICD-10-CM | POA: Diagnosis present

## 2019-09-29 DIAGNOSIS — Z20822 Contact with and (suspected) exposure to covid-19: Secondary | ICD-10-CM | POA: Diagnosis present

## 2019-09-29 DIAGNOSIS — L98421 Non-pressure chronic ulcer of back limited to breakdown of skin: Secondary | ICD-10-CM

## 2019-09-29 DIAGNOSIS — J9601 Acute respiratory failure with hypoxia: Secondary | ICD-10-CM

## 2019-09-29 DIAGNOSIS — I10 Essential (primary) hypertension: Secondary | ICD-10-CM

## 2019-09-29 DIAGNOSIS — E861 Hypovolemia: Secondary | ICD-10-CM | POA: Diagnosis present

## 2019-09-29 DIAGNOSIS — Z79890 Hormone replacement therapy (postmenopausal): Secondary | ICD-10-CM | POA: Diagnosis not present

## 2019-09-29 DIAGNOSIS — I110 Hypertensive heart disease with heart failure: Secondary | ICD-10-CM | POA: Diagnosis present

## 2019-09-29 DIAGNOSIS — E860 Dehydration: Secondary | ICD-10-CM | POA: Diagnosis present

## 2019-09-29 DIAGNOSIS — R5381 Other malaise: Secondary | ICD-10-CM

## 2019-09-29 DIAGNOSIS — Z87891 Personal history of nicotine dependence: Secondary | ICD-10-CM | POA: Diagnosis not present

## 2019-09-29 DIAGNOSIS — J9621 Acute and chronic respiratory failure with hypoxia: Secondary | ICD-10-CM | POA: Diagnosis not present

## 2019-09-29 DIAGNOSIS — L98429 Non-pressure chronic ulcer of back with unspecified severity: Secondary | ICD-10-CM

## 2019-09-29 DIAGNOSIS — R001 Bradycardia, unspecified: Secondary | ICD-10-CM | POA: Diagnosis present

## 2019-09-29 DIAGNOSIS — R8271 Bacteriuria: Secondary | ICD-10-CM | POA: Diagnosis present

## 2019-09-29 DIAGNOSIS — Z825 Family history of asthma and other chronic lower respiratory diseases: Secondary | ICD-10-CM | POA: Diagnosis not present

## 2019-09-29 DIAGNOSIS — Z66 Do not resuscitate: Secondary | ICD-10-CM

## 2019-09-29 DIAGNOSIS — M2550 Pain in unspecified joint: Secondary | ICD-10-CM | POA: Diagnosis not present

## 2019-09-29 DIAGNOSIS — Z7401 Bed confinement status: Secondary | ICD-10-CM | POA: Diagnosis not present

## 2019-09-29 DIAGNOSIS — R1312 Dysphagia, oropharyngeal phase: Secondary | ICD-10-CM

## 2019-09-29 DIAGNOSIS — E039 Hypothyroidism, unspecified: Secondary | ICD-10-CM

## 2019-09-29 DIAGNOSIS — Z743 Need for continuous supervision: Secondary | ICD-10-CM | POA: Diagnosis not present

## 2019-09-29 DIAGNOSIS — Z8616 Personal history of COVID-19: Secondary | ICD-10-CM | POA: Diagnosis not present

## 2019-09-29 DIAGNOSIS — E870 Hyperosmolality and hypernatremia: Secondary | ICD-10-CM

## 2019-09-29 DIAGNOSIS — R8281 Pyuria: Secondary | ICD-10-CM | POA: Diagnosis present

## 2019-09-29 DIAGNOSIS — D649 Anemia, unspecified: Secondary | ICD-10-CM | POA: Diagnosis present

## 2019-09-29 DIAGNOSIS — J9600 Acute respiratory failure, unspecified whether with hypoxia or hypercapnia: Secondary | ICD-10-CM | POA: Diagnosis not present

## 2019-09-29 DIAGNOSIS — Z79899 Other long term (current) drug therapy: Secondary | ICD-10-CM | POA: Diagnosis not present

## 2019-09-29 DIAGNOSIS — J440 Chronic obstructive pulmonary disease with acute lower respiratory infection: Secondary | ICD-10-CM | POA: Diagnosis present

## 2019-09-29 LAB — URINALYSIS, ROUTINE W REFLEX MICROSCOPIC
Bilirubin Urine: NEGATIVE
Glucose, UA: NEGATIVE mg/dL
Ketones, ur: 20 mg/dL — AB
Nitrite: NEGATIVE
Protein, ur: 30 mg/dL — AB
Specific Gravity, Urine: 1.021 (ref 1.005–1.030)
pH: 5 (ref 5.0–8.0)

## 2019-09-29 LAB — CBC
HCT: 36.7 % — ABNORMAL LOW (ref 39.0–52.0)
Hemoglobin: 11 g/dL — ABNORMAL LOW (ref 13.0–17.0)
MCH: 29.9 pg (ref 26.0–34.0)
MCHC: 30 g/dL (ref 30.0–36.0)
MCV: 99.7 fL (ref 80.0–100.0)
Platelets: 172 10*3/uL (ref 150–400)
RBC: 3.68 MIL/uL — ABNORMAL LOW (ref 4.22–5.81)
RDW: 13.5 % (ref 11.5–15.5)
WBC: 9.1 10*3/uL (ref 4.0–10.5)
nRBC: 0 % (ref 0.0–0.2)

## 2019-09-29 LAB — BASIC METABOLIC PANEL
Anion gap: 15 (ref 5–15)
BUN: 37 mg/dL — ABNORMAL HIGH (ref 8–23)
CO2: 21 mmol/L — ABNORMAL LOW (ref 22–32)
Calcium: 8.4 mg/dL — ABNORMAL LOW (ref 8.9–10.3)
Chloride: 117 mmol/L — ABNORMAL HIGH (ref 98–111)
Creatinine, Ser: 1.15 mg/dL (ref 0.61–1.24)
GFR calc Af Amer: >60 mL/min (ref >60)
GFR calc non Af Amer: 55 mL/min — ABNORMAL LOW (ref >60)
Glucose, Bld: 97 mg/dL (ref 70–99)
Potassium: 4 mmol/L (ref 3.5–5.1)
Sodium: 153 mmol/L — ABNORMAL HIGH (ref 135–145)

## 2019-09-29 LAB — LACTIC ACID, PLASMA: Lactic Acid, Venous: 1.4 mmol/L (ref 0.5–1.9)

## 2019-09-29 MED ORDER — AMLODIPINE BESYLATE 5 MG PO TABS
5.0000 mg | ORAL_TABLET | Freq: Every day | ORAL | Status: DC
  Administered 2019-09-29 – 2019-10-01 (×2): 5 mg via ORAL
  Filled 2019-09-29 (×3): qty 1

## 2019-09-29 MED ORDER — BUDESONIDE 0.5 MG/2ML IN SUSP
0.5000 mg | Freq: Two times a day (BID) | RESPIRATORY_TRACT | Status: DC
  Administered 2019-09-29 – 2019-09-30 (×2): 0.5 mg via RESPIRATORY_TRACT
  Filled 2019-09-29 (×2): qty 2

## 2019-09-29 MED ORDER — FERROUS SULFATE 325 (65 FE) MG PO TABS
325.0000 mg | ORAL_TABLET | Freq: Every day | ORAL | Status: DC
  Administered 2019-10-01: 325 mg via ORAL
  Filled 2019-09-29 (×2): qty 1

## 2019-09-29 MED ORDER — HYDRALAZINE HCL 20 MG/ML IJ SOLN: 10.0000 mg | INTRAMUSCULAR | Status: DC | PRN

## 2019-09-29 MED ORDER — RESOURCE THICKENUP CLEAR PO POWD
Freq: Once | ORAL | Status: AC
  Filled 2019-09-29: qty 125

## 2019-09-29 MED ORDER — METHYLPREDNISOLONE SODIUM SUCC 125 MG IJ SOLR
60.0000 mg | Freq: Two times a day (BID) | INTRAMUSCULAR | Status: DC
  Administered 2019-09-29 – 2019-10-01 (×4): 60 mg via INTRAVENOUS
  Filled 2019-09-29 (×4): qty 2

## 2019-09-29 MED ORDER — KCL IN DEXTROSE-NACL 20-5-0.2 MEQ/L-%-% IV SOLN
INTRAVENOUS | Status: DC
  Filled 2019-09-29 (×2): qty 1000

## 2019-09-29 MED ORDER — IPRATROPIUM-ALBUTEROL 0.5-2.5 (3) MG/3ML IN SOLN: 3.0000 mL | Freq: Four times a day (QID) | RESPIRATORY_TRACT | Status: DC | PRN

## 2019-09-29 MED ORDER — GUAIFENESIN 100 MG/5ML PO SOLN
15.0000 mL | Freq: Four times a day (QID) | ORAL | Status: DC
  Administered 2019-09-29 – 2019-10-01 (×5): 300 mg via ORAL
  Filled 2019-09-29 (×2): qty 5
  Filled 2019-09-29 (×2): qty 15
  Filled 2019-09-29: qty 5
  Filled 2019-09-29: qty 10
  Filled 2019-09-29: qty 15
  Filled 2019-09-29 (×2): qty 5

## 2019-09-29 MED ORDER — ADULT MULTIVITAMIN W/MINERALS CH
1.0000 | ORAL_TABLET | Freq: Every day | ORAL | Status: DC
  Administered 2019-09-29 – 2019-10-01 (×2): 1 via ORAL
  Filled 2019-09-29 (×3): qty 1

## 2019-09-29 MED ORDER — ARFORMOTEROL TARTRATE 15 MCG/2ML IN NEBU
15.0000 ug | INHALATION_SOLUTION | Freq: Two times a day (BID) | RESPIRATORY_TRACT | Status: DC
  Administered 2019-09-29 – 2019-09-30 (×2): 15 ug via RESPIRATORY_TRACT
  Filled 2019-09-29 (×2): qty 2

## 2019-09-29 NOTE — Progress Notes (Signed)
New Admission Note:   Arrival Method: Bed  Mental Orientation: Alert  Telemetry: none  Assessment: Completed Skin: stage 2 pressure wound  IV: right and left forearm  Pain: 0/10  Tubes: nasal canula  Safety Measures: Safety Fall Prevention Plan has been given, discussed and signed Admission: Completed [facility] Orientation: Patient has been orientated to the room, unit and staff.  Family: daughter   Orders have been reviewed and implemented. Will continue to monitor the patient. Call light has been placed within reach and bed alarm has been activated.   Adana Marik RN [facility] Phone: [phone]

## 2019-09-29 NOTE — ED Notes (Signed)
Airmattress ordered.

## 2019-09-29 NOTE — Evaluation (Signed)
Clinical/Bedside Swallow Evaluation Patient Details  Name: [name] Watland Sr. MRN: [id number] Date of Birth: 1925-03-26  Today's Date: 09/29/2019 Time: SLP Start Time (ACUTE ONLY): 1419 SLP Stop Time (ACUTE ONLY): 1448 SLP Time Calculation (min) (ACUTE ONLY): 29 min  Past Medical History:  Past Medical History:  Diagnosis Date  . Anemia   . Bradycardia   . COPD (chronic obstructive pulmonary disease) (HCC)    Mild  . HTN (hypertension)   . Hypothyroidism    Past Surgical History:  Past Surgical History:  Procedure Laterality Date  . CATARACT EXTRACTION Bilateral   . GALLBLADDER SURGERY  02/21/2011  . OTHER SURGICAL HISTORY     finger right pointer--laceration repair  . PACEMAKER INSERTION     at [facility]   . TOTAL HIP ARTHROPLASTY     HPI:   86 y.o. male with medical history significant for COPD, chronic respiratory failure, bradycardia, hypertension, hypothyroidism, chronic diastolic CHF, dysphagia with aspiration, COVID-19 Jan 4-15/21,and sacral wound who presents to the ED 4/15 for evaluation of change in mental status and shortness of breath at home. Dx acute on chronic resp failure with hypoxia, hypernatremia.  Pt followed by SLP during January admission.  He has a hx of chronic dysphagia that appears to wax and wane. Notes from SLP eval 1/22 state: "He has had MBS in [facility] system in Oct 2019.  His daughter, [name] [name], is knowledgable about his swallowing and reports that every time her father is hospitalized, he decompensates, swallow becomes acutely worse, then he slowly improves upon return home.  Had PEG 2019, removed early 2020. During last hospitalization at [facility] (D/Cd 1/15), it was recommended that he remain on a mechanical soft diet with nectar thick liquids.  FEES had been planned but family declined."  During late January admission to [facility], it was recommended that he start a dysphaga 1 diet with honey-thick liquids and that careful hand-feeding be provided. He presented with  obvious s/s of aspiration at that time- focus was on minimizing overt aspiration.    Assessment / Plan / Recommendation Clinical Impression  Pt's swallow function appears relatively consistent with results from last clinical swallow evaluation in January of this year.  Pt is alert, able to follow commands well enough to participate in eating.  His daughter and caregiver, [name] [name], is present at the bedside.  We reviewed pt's history of dysphagia; he continues to fluctuate with overall swallowing function per her description.  Today, pt was repositioned in order to optimize safety.  He tends to hold his neck in extension, creating potential for greater aspiration risk; it was difficult to maneuver head into neutral/flexion.  With total assist, pt was offered teaspoons of puree and honey-thick liquids.  He kept his eyes open, attended to approaching spoon, actively manipulated POs and swallowed consistently with each bolus.  There was intermittent throat-clearing noted throughout.  Pt continues to be an aspiration risk, though he does not have a pna at this time and he is not demonstrating adverse consequences of aspiration.   After discussion with [name] [name], plan was made to resume a dysphagia 1 diet with honey-thick liquids; meds to be crushed in puree. Please position pt with HOB elevated and attempt to bring head into neutral. Provide careful hand-feeding and hold POs if pt coughs excessivey or appears to be uncomfortable.  SLP will follow briefly.   SLP Visit Diagnosis: Dysphagia, unspecified (R13.10)    Aspiration Risk  Moderate aspiration risk    Diet Recommendation   dysphagia 1, honey  thick  Medication Administration: Crushed with puree    Other  Recommendations Oral Care Recommendations: Oral care BID Other Recommendations: Order thickener from pharmacy   Follow up Recommendations None      Frequency and Duration min 1 x/week  1 week       Prognosis Prognosis for Safe Diet Advancement:  Fair      Swallow Study   General HPI:  86 y.o. male with medical history significant for COPD, chronic respiratory failure, bradycardia, hypertension, hypothyroidism, chronic diastolic CHF, dysphagia with aspiration, COVID-19 Jan 4-15/21,and sacral wound who presents to the ED 4/15 for evaluation of change in mental status and shortness of breath at home. Dx acute on chronic resp failure with hypoxia, hypernatremia.  Pt followed by SLP during January admission.  He has a hx of chronic dysphagia that appears to wax and wane. Notes from SLP eval 1/22 state: "He has had MBS in [facility] system in Oct 2019.  His daughter, [name] [name], is knowledgable about his swallowing and reports that every time her father is hospitalized, he decompensates, swallow becomes acutely worse, then he slowly improves upon return home.  Had PEG 2019, removed early 2020. During last hospitalization at [facility] (D/Cd 1/15), it was recommended that he remain on a mechanical soft diet with nectar thick liquids.  FEES had been planned but family declined."  During late January admission to [facility], it was recommended that he start a dysphaga 1 diet with honey-thick liquids and that careful hand-feeding be provided. He presented with obvious s/s of aspiration at that time- focus was on minimizing overt aspiration.  Type of Study: Bedside Swallow Evaluation Previous Swallow Assessment: see HPI Diet Prior to this Study: NPO Temperature Spikes Noted: No Respiratory Status: Nasal cannula History of Recent Intubation: No Behavior/Cognition: Alert Oral Cavity Assessment: Within Functional Limits Oral Care Completed by SLP: No Oral Cavity - Dentition: Edentulous Self-Feeding Abilities: Total assist Patient Positioning: Upright in bed Baseline Vocal Quality: Low vocal intensity Volitional Cough: Cognitively unable to elicit Volitional Swallow: Unable to elicit    Oral/Motor/Sensory Function Overall Oral Motor/Sensory Function: Other  (comment)(symmetric at baseline)   Ice Chips Ice chips: Not tested   Thin Liquid Thin Liquid: Not tested    Nectar Thick Nectar Thick Liquid: Not tested   Honey Thick Honey Thick Liquid: Impaired Presentation: Spoon Pharyngeal Phase Impairments: Throat Clearing - Delayed   Puree Puree: Impaired Presentation: Spoon Oral Phase Functional Implications: Prolonged oral transit Pharyngeal Phase Impairments: Throat Clearing - Delayed   Solid     Solid: Not tested      Juan Quam Laurice 09/29/2019,2:59 PM  [name], [place] Office number [phone] Pager [phone]

## 2019-09-29 NOTE — Consult Note (Signed)
WOC Nurse Consult Note: Patient receiving care in [facility] ED [facility].  Consult completed remotely after review of record and image of wound. Reason for Consult: sacral wound Wound type: evolving DTPI to sacrum, bilateral buttocks (L>R) Pressure Injury POA: Yes Measurement: see image Wound bed: areas of maroon discoloration with tissue impairment to sacrum and bilateral buttocks, L > R. Expect these wounds to continue to evolve.   Drainage (amount, consistency, odor) none Periwound: fragile, impaired Dressing procedure/placement/frequency:  Wash sacrum and buttocks with soap and water. Pat dry. Place Xeroform dressings Kellie Simmering [phone]) over the sacrum and buttocks wounds, then cover with foam dressings. Change daily and prn soilage. I have also added an air mattress, turning and linen instructions to the care plan for this patient. Monitor the wound area(s) for worsening of condition such as: Signs/symptoms of infection,  Increase in size,  Development of or worsening of odor, Development of pain, or increased pain at the affected locations.  Notify the medical team if any of these develop.  Thank you for the consult.  [facility] nurse will not follow at this time.  Please re-consult the [place] team if needed.  Val Riles, RN, MSN, CWOCN, CNS-BC, pager [phone]

## 2019-09-29 NOTE — Progress Notes (Signed)
PROGRESS NOTE  [name] Muenchow Sr. [id number] DOB: [birth year]   PCP: Tonia Ghent, MD  Patient is from: Home.  Fairly oriented at baseline.  Wheelchair-bound at baseline.  DOA: 09/28/2019 LOS: 0  Brief Narrative / Interim history: 86 year old male with history of COPD not on oxygen, bradycardia/PPM in [id number], diastolic CHF, dysphagia with aspiration, HTN, hypothyroidism, sacral decubitus, debility and COVID-19 pneumonia in 06/2019 presenting with AMS, shortness of breath, productive cough, mild temp and decreased urine output for several days.  In ED, hemodynamically stable.  T-max 99.9.  90% on 4 L.  Hgb 10.2. Na 153.  Otherwise, CBC and CMP without significant finding.  Lactic acid 0.9.  COVID-19 and influenza PCR is negative.  CXR without acute finding.  Started on IV Solu-Medrol, vancomycin and cefepime and admitted.  On admission, antibiotic disc related to azithromycin to cover for COPD exacerbation.  Subjective: Seen and examined earlier this morning.  Patient lying comfortably with NRB saturating at 100%.  He is awake and alert but does not interact or follows command.  Appears very frail but no acute distress.  Objective: Vitals:   09/29/19 1046 09/29/19 1057 09/29/19 1100 09/29/19 1137  BP: (!) 169/134 (!) 178/87 (!) 172/93 (!) 155/87  Pulse: 81   70  Resp: (!) 26 (!) 23  (!) 22  Temp:    98.4 F (36.9 C)  TempSrc:    Oral  SpO2: 99% 98%  100%    Intake/Output Summary (Last 24 hours) at 09/29/2019 1459 Last data filed at 09/29/2019 1300 Gross per 24 hour  Intake 1350 ml  Output --  Net 1350 ml   There were no vitals filed for this visit.  Examination:  GENERAL: Frail but nontoxic.  No distress. HEENT: MMM.  Vision and hearing grossly intact.  NECK: Supple.  No apparent JVD.  RESP: 100% on NRB.  No IWOB.  Diminished aeration bilaterally. CVS:  RRR. Heart sounds normal.  ABD/GI/GU: BS present. Soft. Non tender.  MSK/EXT: Extremity contractures?  No edema.   Global muscle mass and subcu fat loss. SKIN: Stage II sacral decubitus. NEURO: Awake, alert but does not interact.  Does not follows command.  No apparent focal neuro deficit. PSYCH: Calm.  No agitation or distress.  Procedures:  None  Microbiology summarized: COVID-19 PCR negative. Influenza PCR negative. Blood cultures negative so far.  Assessment & Plan: Acute respiratory failure with hypoxia due to COPD exacerbation-patient with cardinal symptoms of COPD exacerbation including SOB, productive cough and mild fever.  No leukocytosis.  Hypoxia improving -Continue weaning oxygen.  Not on oxygen prior to arrival -Change prednisone to IV Solu-Medrol while n.p.o. pending SLP eval. -Continue azithromycin. -Hold home Trelegy Ellipta.  Start Pulmicort and Brovana with as needed DuoNeb. -Wean oxygen as able -Aspiration precautions.  Hypovolemic hypernatremia likely due to dehydration in the setting of poor p.o. intake: Na 153 -Start D5-1/4NS-KCl at 75 cc an hour -Recheck in the morning  Acute metabolic encephalopathy: No history of dementia per daughter.  Fairly oriented at baseline.  Improving -Frequent reorientation and delirium, fall and aspiration precautions -Avoid sedating medications  Bradycardia s/p PPM: -Chronic and stable with V paced rhythm.  Chronic diastolic CHF: Hypovolemic/volume depleted. -IV fluid as above -Continue monitoring fluid status.  Essential hypertension: BP not well controlled. -As needed IV hydralazine while n.p.o. -We will resume home hydralazine once he can take p.o.  Safely.  Pyuria/bacteriuria: No UTI symptoms per daughter -No indication for antibiotics  Hypothyroidism: -Continue Synthroid.  Dysphagia: -N.p.o. pending  SLP eval -Aspiration precautions.  Insomnia: Continue Seroquel at night.  Severe protein calorie malnutrition: There is no height or weight on file to calculate BMI.  Significant muscle mass and subcu fat loss.   Likely due to poor p.o. intake. -Consult dietitian             Sacral ulcer: POA.  Chronic and appears stable per daughter.  -Appreciate wound care input Pressure Injury 09/29/19 Sacrum Medial Stage 2 -  Partial thickness loss of dermis presenting as a shallow open injury with a red, pink wound bed without slough. (Active)  09/29/19 1139  Location: Sacrum  Location Orientation: Medial  Staging: Stage 2 -  Partial thickness loss of dermis presenting as a shallow open injury with a red, pink wound bed without slough.  Wound Description (Comments):   Present on Admission: Yes     Pressure Injury 07/07/19 Foot Left Stage 1 -  Intact skin with non-blanchable redness of a localized area usually over a bony prominence. (Active)  07/07/19 2250  Location: Foot  Location Orientation: Left  Staging: Stage 1 -  Intact skin with non-blanchable redness of a localized area usually over a bony prominence.  Wound Description (Comments):   Present on Admission: Yes     DVT prophylaxis: Subcu heparin Code Status: DNR/DNI-confirmed with patient's daughter. Family Communication: Updated patient's daughter over the phone.  Discharge barrier: Hypoxic respiratory failure requiring supplemental oxygen, hypernatremia and dysphagia requiring IV fluid Patient is from: Home Final disposition: Likely home with home health and DME once medically stable.  Patient's daughter not interested in SNF.  Consultants:  None   Sch Meds:  Scheduled Meds: . arformoterol  15 mcg Nebulization BID  . [START ON 09/30/2019] azithromycin  250 mg Oral Daily  . budesonide (PULMICORT) nebulizer solution  0.5 mg Nebulization BID  . guaiFENesin  15 mL Oral QID  . heparin  5,000 Units Subcutaneous Q8H  . levothyroxine  100 mcg Oral QAC breakfast  . methylPREDNISolone (SOLU-MEDROL) injection  60 mg Intravenous Q12H  . pantoprazole  40 mg Oral Daily  . QUEtiapine  12.5 mg Oral QHS   Continuous Infusions: . dextrose 5 %  and 0.2 % NaCl with KCl 20 mEq     PRN Meds:.acetaminophen **OR** acetaminophen, albuterol, hydrALAZINE, ipratropium-albuterol, ondansetron **OR** ondansetron (ZOFRAN) IV, polyethylene glycol  Antimicrobials: Anti-infectives (From admission, onward)   Start     Dose/Rate Route Frequency Ordered Stop   09/30/19 1000  azithromycin (ZITHROMAX) tablet 250 mg     250 mg Oral Daily 09/28/19 2236 10/04/19 0959   09/29/19 2100  vancomycin (VANCOREADY) IVPB 750 mg/150 mL  Status:  Discontinued     750 mg 150 mL/hr over 60 Minutes Intravenous Every 24 hours 09/28/19 2025 09/28/19 2236   09/29/19 1000  azithromycin (ZITHROMAX) tablet 500 mg     500 mg Oral Daily 09/28/19 2236 09/29/19 1026   09/28/19 2030  ceFEPIme (MAXIPIME) 2 g in sodium chloride 0.9 % 100 mL IVPB  Status:  Discontinued     2 g 200 mL/hr over 30 Minutes Intravenous Every 12 hours 09/28/19 2000 09/28/19 2236   09/28/19 2030  vancomycin (VANCOREADY) IVPB 1250 mg/250 mL     1,250 mg 166.7 mL/hr over 90 Minutes Intravenous  Once 09/28/19 2016 09/29/19 0048   09/28/19 2015  vancomycin (VANCOCIN) IVPB 1000 mg/200 mL premix  Status:  Discontinued     1,000 mg 200 mL/hr over 60 Minutes Intravenous  Once 09/28/19 2000 09/28/19 2016  09/28/19 2015  azithromycin (ZITHROMAX) 500 mg in sodium chloride 0.9 % 250 mL IVPB  Status:  Discontinued     500 mg 250 mL/hr over 60 Minutes Intravenous  Once 09/28/19 2000 09/28/19 2012       I have personally reviewed the following labs and images: CBC: Recent Labs  Lab 09/28/19 1917 09/29/19 0240  WBC 6.9 9.1  NEUTROABS 4.6  --   HGB 10.2* 11.0*  HCT 32.3* 36.7*  MCV 97.6 99.7  PLT 183 172   BMP &GFR Recent Labs  Lab 09/28/19 1917 09/29/19 0240  NA 153* 153*  K 3.5 4.0  CL 118* 117*  CO2 23 21*  GLUCOSE 90 97  BUN 37* 37*  CREATININE 1.17 1.15  CALCIUM 8.5* 8.4*   CrCl cannot be calculated (Unknown ideal weight.). Liver & Pancreas: Recent Labs  Lab 09/28/19 1917  AST 23    ALT 11  ALKPHOS 60  BILITOT 0.9  PROT 6.4*  ALBUMIN 2.4*   No results for input(s): LIPASE, AMYLASE in the last 168 hours. No results for input(s): AMMONIA in the last 168 hours. Diabetic: No results for input(s): HGBA1C in the last 72 hours. No results for input(s): GLUCAP in the last 168 hours. Cardiac Enzymes: No results for input(s): CKTOTAL, CKMB, CKMBINDEX, TROPONINI in the last 168 hours. No results for input(s): PROBNP in the last 8760 hours. Coagulation Profile: Recent Labs  Lab 09/28/19 1917  INR 1.2   Thyroid Function Tests: No results for input(s): TSH, T4TOTAL, FREET4, T3FREE, THYROIDAB in the last 72 hours. Lipid Profile: No results for input(s): CHOL, HDL, LDLCALC, TRIG, CHOLHDL, LDLDIRECT in the last 72 hours. Anemia Panel: No results for input(s): VITAMINB12, FOLATE, FERRITIN, TIBC, IRON, RETICCTPCT in the last 72 hours. Urine analysis:    Component Value Date/Time   COLORURINE YELLOW 09/29/2019 0520   APPEARANCEUR HAZY (A) 09/29/2019 0520   LABSPEC 1.021 09/29/2019 0520   PHURINE 5.0 09/29/2019 0520   GLUCOSEU NEGATIVE 09/29/2019 0520   GLUCOSEU NEGATIVE 05/26/2018 1716   HGBUR SMALL (A) 09/29/2019 0520   BILIRUBINUR NEGATIVE 09/29/2019 0520   KETONESUR 20 (A) 09/29/2019 0520   PROTEINUR 30 (A) 09/29/2019 0520   UROBILINOGEN 0.2 05/26/2018 1716   NITRITE NEGATIVE 09/29/2019 0520   LEUKOCYTESUR SMALL (A) 09/29/2019 0520   Sepsis Labs: Invalid input(s): PROCALCITONIN, [place]  Microbiology: Recent Results (from the past 240 hour(s))  Culture, blood (Routine x 2)     Status: None (Preliminary result)   Collection Time: 09/28/19  7:17 PM   Specimen: BLOOD  Result Value Ref Range Status   Specimen Description BLOOD BLOOD RIGHT FOREARM  Final   Special Requests   Final    BOTTLES DRAWN AEROBIC AND ANAEROBIC Blood Culture adequate volume   Culture   Final    NO GROWTH < 24 HOURS Performed at [facility], [address]  [place], [place] [address]    Report Status PENDING  Incomplete  Culture, blood (Routine x 2)     Status: None (Preliminary result)   Collection Time: 09/28/19  7:22 PM   Specimen: BLOOD  Result Value Ref Range Status   Specimen Description BLOOD BLOOD LEFT FOREARM  Final   Special Requests   Final    BOTTLES DRAWN AEROBIC AND ANAEROBIC Blood Culture adequate volume   Culture   Final    NO GROWTH < 24 HOURS Performed at [facility], [place] [address], [place], [place] [address]    Report Status  PENDING  Incomplete  Respiratory Panel by RT PCR (Flu A&B, Covid) - Nasopharyngeal Swab     Status: None   Collection Time: 09/28/19  7:51 PM   Specimen: Nasopharyngeal Swab  Result Value Ref Range Status   SARS Coronavirus 2 by RT PCR NEGATIVE NEGATIVE Final    Comment: (NOTE) SARS-CoV-2 target nucleic acids are NOT DETECTED. The SARS-CoV-2 RNA is generally detectable in upper respiratoy specimens during the acute phase of infection. The lowest concentration of SARS-CoV-2 viral copies this assay can detect is 131 copies/mL. A negative result does not preclude SARS-Cov-2 infection and should not be used as the sole basis for treatment or other patient management decisions. A negative result may occur with  improper specimen collection/handling, submission of specimen other than nasopharyngeal swab, presence of viral mutation(s) within the areas targeted by this assay, and inadequate number of viral copies (<131 copies/mL). A negative result must be combined with clinical observations, patient history, and epidemiological information. The expected result is Negative. Fact Sheet for Patients:  PinkCheek.be Fact Sheet for Healthcare Providers:  GravelBags.it This test is not yet ap proved or cleared by the [place] FDA and  has been authorized for detection and/or diagnosis of SARS-CoV-2 by FDA under an Emergency Use Authorization  (EUA). This EUA will remain  in effect (meaning this test can be used) for the duration of the COVID-19 declaration under Section 564(b)(1) of the Act, 21 U.S.C. section 360bbb-3(b)(1), unless the authorization is terminated or revoked sooner.    Influenza A by PCR NEGATIVE NEGATIVE Final   Influenza B by PCR NEGATIVE NEGATIVE Final    Comment: (NOTE) The Xpert Xpress SARS-CoV-2/FLU/RSV assay is intended as an aid in  the diagnosis of influenza from Nasopharyngeal swab specimens and  should not be used as a sole basis for treatment. Nasal washings and  aspirates are unacceptable for Xpert Xpress SARS-CoV-2/FLU/RSV  testing. Fact Sheet for Patients: PinkCheek.be Fact Sheet for Healthcare Providers: GravelBags.it This test is not yet approved or cleared by the [place] FDA and  has been authorized for detection and/or diagnosis of SARS-CoV-2 by  FDA under an Emergency Use Authorization (EUA). This EUA will remain  in effect (meaning this test can be used) for the duration of the  Covid-19 declaration under Section 564(b)(1) of the Act, 21  U.S.C. section 360bbb-3(b)(1), unless the authorization is  terminated or revoked. Performed at [facility], [place] [address] [place], [place] [address]     Radiology Studies: DG Chest Port 1 View  Result Date: 09/28/2019 CLINICAL DATA:  Altered level of consciousness, fever, somnolent EXAM: PORTABLE CHEST 1 VIEW COMPARISON:  11/10/2018 FINDINGS: Single frontal view of the chest demonstrates dual lead pacemaker overlying left chest, proximal lead overlying right atrium and distal lead excluded by collimation. The cardiac silhouette is unremarkable. There is ectasia of the thoracic aorta. Chronic scarring within the lungs, without airspace disease, effusion, or pneumothorax. No acute bony abnormalities. IMPRESSION: 1. No acute intrathoracic process. Electronically Signed   By: Randa Ngo M.D.   On: 09/28/2019 20:06   45 minutes with more than 50% spent in reviewing records, counseling patient/family and coordinating care.   Armour Villanueva T. [place]all  If 7PM-7AM, please contact night-coverage [email or web address] Password [facility] 09/29/2019, 2:59 PM

## 2019-09-29 NOTE — Care Management Obs Status (Signed)
[place] NOTIFICATION   Patient Details  Name: [name] Rauseo Sr. MRN: [id number] Date of Birth: [birth year]   Medicare Observation Status Notification Given:  Yes    Bartholomew Crews, RN 09/29/2019, 2:41 PM

## 2019-09-29 NOTE — ED Notes (Signed)
Attempted several times through out the night to take pt off non rebreather, pt stat drops quickly to mid 80s and 70s on [place], pt remains on NRB, pt unable to swallow pills at baseline per daughter, PO medications held

## 2019-09-29 NOTE — Evaluation (Signed)
Physical Therapy Evaluation Patient Details Name: [name] Mahala Sr. MRN: [id number] DOB: 1925-03-15 Today's Date: 09/29/2019   History of Present Illness  86 yo male with onset of AMS and fever was admitted, noted SOB and use of O2 which was preceding arrival of hosp.  Has been having elevated HR, significant skin breakdown on sacrum, hypoxia.  PMHx:  bradycardia, pacemaker, COPD, chronic resp failure, CHF with EF 55-60%, dysphagia, aspiration, THA,   Clinical Impression  Pt was seen for mobility of sitting up and moving legs on side of bed. His daughter was present to offer information about PLOF, and note his limits with O2 and contractures of knees began prior to the arrival at [facility].  Follow up with pt on sitting balance control, to restart standing and to get ROM back on knees to restore control of movement and resume sitting OOB in chair when ready.  Pt is expected to go home from hosp, but pt would benefit from rehab.  Due to bad experience prev, daughter is going to talk with family to get consensus on whether SNF stay is practical for him.    Follow Up Recommendations SNF    Equipment Recommendations  None recommended by PT    Recommendations for Other Services       Precautions / Restrictions Precautions Precautions: Fall Precaution Comments: skin breakdown, contractures knees Restrictions Weight Bearing Restrictions: No      Mobility  Bed Mobility Overal bed mobility: Needs Assistance Bed Mobility: Supine to Sit;Sit to Supine     Supine to sit: Max assist Sit to supine: Total assist   General bed mobility comments: control of sitting balance is fair once set but requires S  Transfers                 General transfer comment: dependent due to knee contractures  Ambulation/Gait             General Gait Details: nonambulatory  Stairs            Wheelchair Mobility    Modified Rankin (Stroke Patients Only)       Balance Overall balance  assessment: Needs assistance Sitting-balance support: Bilateral upper extremity supported;Feet supported Sitting balance-Leahy Scale: Fair         Standing balance comment: unable to stand                             Pertinent Vitals/Pain Pain Assessment: No/denies pain    Home Living Family/patient expects to be discharged to:: Private residence Living Arrangements: Children Available Help at Discharge: Family;Available 24 hours/day Type of Home: House Home Access: Level entry     Home Layout: One level Home Equipment: Tub bench;Walker - 2 wheels;Bedside commode;Hospital bed;Wheelchair - Education administrator (comment)      Prior Function Level of Independence: Needs assistance   Gait / Transfers Assistance Needed: transfers with family assist, to wc with family helping to propel but Dec 2020 and before walked with min guard on RW  ADL's / Homemaking Assistance Needed: assist to dress and bathe with caregiver, can eat with supervision only        Hand Dominance   Dominant Hand: Right    Extremity/Trunk Assessment   Upper Extremity Assessment Upper Extremity Assessment: Generalized weakness    Lower Extremity Assessment Lower Extremity Assessment: Generalized weakness(as well as knee flexion contractures)    Cervical / Trunk Assessment Cervical / Trunk Assessment: Kyphotic  Communication  Communication: Expressive difficulties  Cognition Arousal/Alertness: Lethargic Behavior During Therapy: Flat affect Overall Cognitive Status: Impaired/Different from baseline Area of Impairment: Problem solving;Awareness;Safety/judgement;Following commands;Memory;Attention;Orientation                 Orientation Level: Situation;Time Current Attention Level: Selective Memory: Decreased recall of precautions;Decreased short-term memory Following Commands: Follows one step commands inconsistently;Follows one step commands with increased time Safety/Judgement:  Decreased awareness of safety;Decreased awareness of deficits Awareness: Intellectual Problem Solving: Slow processing;Requires verbal cues;Requires tactile cues        General Comments General comments (skin integrity, edema, etc.): pt was noted to have sats 95% sitting and in bed, has pulses in 72-76 range    Exercises     Assessment/Plan    PT Assessment Patient needs continued PT services  PT Problem List Decreased strength;Decreased range of motion;Decreased activity tolerance;Decreased balance;Decreased mobility;Decreased coordination;Cardiopulmonary status limiting activity       PT Treatment Interventions DME instruction;Functional mobility training;Therapeutic activities;Therapeutic exercise;Balance training;Neuromuscular re-education;Patient/family education;Gait training    PT Goals (Current goals can be found in the Care Plan section)  Acute Rehab PT Goals Patient Stated Goal: none stated PT Goal Formulation: With family Time For Goal Achievement: 10/13/19 Potential to Achieve Goals: Fair    Frequency Min 3X/week   Barriers to discharge Decreased caregiver support home with daughter and caregiver at times    Co-evaluation               AM-PAC PT "6 Clicks" Mobility  Outcome Measure Help needed turning from your back to your side while in a flat bed without using bedrails?: A Lot Help needed moving from lying on your back to sitting on the side of a flat bed without using bedrails?: A Lot Help needed moving to and from a bed to a chair (including a wheelchair)?: Total Help needed standing up from a chair using your arms (e.g., wheelchair or bedside chair)?: Total Help needed to walk in hospital room?: Total Help needed climbing 3-5 steps with a railing? : Total 6 Click Score: 8    End of Session Equipment Utilized During Treatment: Oxygen Activity Tolerance: Patient limited by fatigue;Treatment limited secondary to medical complications  (Comment) Patient left: in bed;with call bell/phone within reach;with bed alarm set;with family/visitor present Nurse Communication: Mobility status PT Visit Diagnosis: Muscle weakness (generalized) (M62.81);Difficulty in walking, not elsewhere classified (R26.2)    Time: [id number] PT Time Calculation (min) (ACUTE ONLY): 32 min   Charges:   PT Evaluation $PT Eval Moderate Complexity: 1 Mod PT Treatments $Therapeutic Activity: 8-22 mins       Ramond Dial 09/29/2019, 1:36 PM  Mee Hives, PT MS Acute Rehab Dept. Number: [place] and [place]

## 2019-09-29 NOTE — Plan of Care (Signed)
  Problem: Activity: Goal: Risk for activity intolerance will decrease Outcome: Progressing   

## 2019-09-29 NOTE — ED Notes (Signed)
Pt placed on air mattress for wounds. Pt turned on side and foam dressings applied to sacral wound and redness on hip.

## 2019-09-29 NOTE — Progress Notes (Signed)
Initial Nutrition Assessment  DOCUMENTATION CODES:   Not applicable  INTERVENTION:   Magic cup TID with meals, each supplement provides 290 kcal and 9 grams of protein  MVI with Minerals   NUTRITION DIAGNOSIS:   Inadequate oral intake related to poor appetite as evidenced by per patient/family report.  GOAL:   Patient will meet greater than or equal to 90% of their needs  MONITOR:   PO intake, Supplement acceptance, Labs, Weight trends, Skin  REASON FOR ASSESSMENT:   Consult Assessment of nutrition requirement/status  ASSESSMENT:   86 yo male admitted with acute respiratory failure, hypovolemic hypernatremia due to dehydration from poor po intake, acute encephalopathy. PMH includes hx of COVID 19 with sacral wound,  dysphagia with hx of PEG tube in 2019/removed 2020, COPD, HTN, CHF  SLP has evaluated and placed patient on Dys 1, Honey Thick.  Recorded po intake 0% of lunch.   Pt is wheelchair bound at baseline; stage II sacral wound on admission  Last weight from Jan 2021; need weight for this admission  Labs: sodium 153 (H), Creatinine wdl Meds: D5-0.2 NaCl with KCl at 75 ml/hr, solumedrol  Diet Order:   Diet Order            DIET - DYS 1 Room service appropriate? No; Fluid consistency: Honey Thick  Diet effective now              EDUCATION NEEDS:   Not appropriate for education at this time  Skin:  Skin Assessment: Skin Integrity Issues: Skin Integrity Issues:: Stage II Stage II: sacrum  Last BM:  4/13  Height:   Ht Readings from Last 1 Encounters:  07/07/19 5\' 5"  (1.651 m)    Weight:   Wt Readings from Last 1 Encounters:  07/07/19 62.1 kg    BMI:  There is no height or weight on file to calculate BMI.  Estimated Nutritional Needs:   Kcal:  1700-1850 kcals  Protein:  85-95 g  Fluid:  >/= 1.7 L    Kerman Passey MS, RDN, LDN, CNSC RD Pager Number and Weekend/On-Call After Hours Pager Located in [place]

## 2019-09-29 NOTE — TOC Initial Note (Addendum)
Transition of Care (TOC) - Initial/Assessment Note    Patient Details  Name: [name] Hasek Sr. MRN: [id number] Date of Birth: [birth year]  Transition of Care [facility]) CM/SW Contact:    Bartholomew Crews, RN Phone Number: [phone] 09/29/2019, 2:49 PM  Clinical Narrative:                  Spoke with daughter, [name], at the bedside. Patient lives with daughter who is his primary caregiver.   She pays out of pocket to [employer] to send a caregiver who assists her with caring for patient. Discussed PCS benefit through Medicaid which might help to provide some caregiver hours. Daughter is agreeable to [facility] completing PCS form and expediting fax [phone]. MD notified of needed signature/initials.   Patient has all needed DME (hospital bed, wheelchair, walker, 3n1, and hoyer lift).   Patient is followed by [facility] for palliative services - daughter was hoping for more hands on approach with [facility], encouraged her to discuss patient needs with [facility] and whether transition to hospice at home would be appropriate at this time. [place] liaison notified of admission.  Address in Epic verified as correct. Patient will need [facility] transport at discharge.   Update: PCS form faxed to [employer]. Original form provided to daughter who will follow up next week.   TOC team following for transition needs.   Expected Discharge Plan: Home/Self Care(home with palliative and home care services) Barriers to Discharge: Continued Medical Work up   Patient Goals and CMS Choice Patient states their goals for this hospitalization and ongoing recovery are:: daughter to bring patient home CMS Medicare.gov Compare Post Acute Care list provided to:: Patient Represenative (must comment)(daughter, Willia Craze) Choice offered to / list presented to : Adult Children  Expected Discharge Plan and Services Expected Discharge Plan: Home/Self Care(home with palliative and home care services)   Discharge  Planning Services: CM Consult   Living arrangements for the past 2 months: Single Family Home                                      Prior Living Arrangements/Services Living arrangements for the past 2 months: Single Family Home Lives with:: Adult Children, Self Patient language and need for interpreter reviewed:: Yes        Need for Family Participation in Patient Care: Yes (Comment) Care giver support system in place?: Yes (comment) Current home services: DME(bed, wheelchair, walker, 3N1, hoyer lift) Criminal Activity/Legal Involvement Pertinent to Current Situation/Hospitalization: No - Comment as needed  Activities of Daily Living      Permission Sought/Granted                  Emotional Assessment Appearance:: Appears stated age       Alcohol / Substance Use: Not Applicable Psych Involvement: No (comment)  Admission diagnosis:  Hypernatremia [E87.0] Acute respiratory failure with hypoxia ([place]) [J96.01] Sepsis ([place]) [A41.9] Acute hypoxemic respiratory failure ([place]) [J96.01] Patient Active Problem List   Diagnosis Date Noted  . Acute respiratory failure with hypoxia ([place]) 09/29/2019  . Acute on chronic respiratory failure with hypoxia ([place]) 09/28/2019  . Hypernatremia 09/28/2019  . Bradycardia 07/23/2019  . Physical deconditioning 07/23/2019  . Pressure injury of skin 07/10/2019  . HCAP (healthcare-associated pneumonia) 07/06/2019  . Skin lesion 12/28/2018  . Sacral ulcer ([place]) 10/17/2018  . H/O laceration of skin 08/24/2018  . Left knee pain 08/24/2018  .  Oropharyngeal dysphagia 05/03/2018  . Advance care planning 05/02/2018  . Protein-calorie malnutrition, severe 03/29/2018  . Altered mental status   . Acute encephalopathy 03/22/2018  . Hypothyroidism 03/22/2018  . Pneumonia 03/10/2018  . Unsteady gait 01/12/2018  . HTN (hypertension) 01/12/2018  . Hyponatremia 01/12/2018  . Status post hip hemiarthroplasty 12/28/2017  . Chronic diastolic  heart failure ([place]) 12/06/2017  . GERD (gastroesophageal reflux disease) 12/06/2017  . Aspiration into airway 02/17/2017  . Osteopenia 01/05/2017  . Anemia, iron deficiency 09/13/2015  . Fall 07/23/2015  . Herpes zoster 04/01/2015  . Chronic back pain 01/21/2015  . Spondylosis of lumbar region without myelopathy or radiculopathy 01/21/2015  . DDD (degenerative disc disease), lumbar 11/26/2014  . Thrombocytopenic ([place]) 09/19/2014  . Normocytic normochromic anemia 12/25/2013  . Solitary pulmonary nodule 03/08/2013  . Diastolic dysfunction [id number]  . Chronic cough 08/26/2012  . COPD (chronic obstructive pulmonary disease) with emphysema ([place]) 08/12/2012  . Chronic respiratory failure ([place]) 08/12/2012  . Benign prostatic hyperplasia 03/18/2011   PCP:  Tonia Ghent, MD Pharmacy:   [id number] - [place], [place] [place] [address] Phone: [phone] Fax: [phone]     Social Determinants of Health (SDOH) Interventions    Readmission Risk Interventions No flowsheet data found.

## 2019-09-30 DIAGNOSIS — I5032 Chronic diastolic (congestive) heart failure: Secondary | ICD-10-CM | POA: Diagnosis not present

## 2019-09-30 DIAGNOSIS — I10 Essential (primary) hypertension: Secondary | ICD-10-CM | POA: Diagnosis not present

## 2019-09-30 DIAGNOSIS — J9601 Acute respiratory failure with hypoxia: Secondary | ICD-10-CM | POA: Diagnosis not present

## 2019-09-30 DIAGNOSIS — J441 Chronic obstructive pulmonary disease with (acute) exacerbation: Secondary | ICD-10-CM | POA: Diagnosis not present

## 2019-09-30 LAB — RENAL FUNCTION PANEL
Albumin: 2.3 g/dL — ABNORMAL LOW (ref 3.5–5.0)
Anion gap: 10 (ref 5–15)
BUN: 35 mg/dL — ABNORMAL HIGH (ref 8–23)
CO2: 23 mmol/L (ref 22–32)
Calcium: 8.3 mg/dL — ABNORMAL LOW (ref 8.9–10.3)
Chloride: 116 mmol/L — ABNORMAL HIGH (ref 98–111)
Creatinine, Ser: 0.93 mg/dL (ref 0.61–1.24)
GFR calc Af Amer: >60 mL/min (ref >60)
GFR calc non Af Amer: >60 mL/min (ref >60)
Glucose, Bld: 159 mg/dL — ABNORMAL HIGH (ref 70–99)
Phosphorus: 2.3 mg/dL — ABNORMAL LOW (ref 2.5–4.6)
Potassium: 3.7 mmol/L (ref 3.5–5.1)
Sodium: 149 mmol/L — ABNORMAL HIGH (ref 135–145)

## 2019-09-30 LAB — MAGNESIUM: Magnesium: 1.8 mg/dL (ref 1.7–2.4)

## 2019-09-30 MED ORDER — KCL IN DEXTROSE-NACL 20-5-0.2 MEQ/L-%-% IV SOLN
INTRAVENOUS | Status: DC
  Filled 2019-09-30 (×2): qty 1000

## 2019-09-30 MED ORDER — FLUTICASONE-UMECLIDIN-VILANT 100-62.5-25 MCG/INH IN AEPB: 1.0000 | INHALATION_SPRAY | Freq: Every day | RESPIRATORY_TRACT | Status: DC

## 2019-09-30 MED ORDER — UMECLIDINIUM-VILANTEROL 62.5-25 MCG/INH IN AEPB
1.0000 | INHALATION_SPRAY | Freq: Every day | RESPIRATORY_TRACT | Status: DC
  Filled 2019-09-30: qty 14

## 2019-09-30 MED ORDER — DEXTROSE 5 % IV SOLN
250.0000 mg | INTRAVENOUS | Status: DC
  Administered 2019-09-30: 250 mg via INTRAVENOUS
  Filled 2019-09-30 (×3): qty 250

## 2019-09-30 NOTE — Progress Notes (Signed)
Patient is not alert and is unable to safely swallow PO meds at this time. MD made aware.

## 2019-09-30 NOTE — Progress Notes (Signed)
PROGRESS NOTE  [name] [name] Sr. [id number] DOB: February 22, 1925   PCP: Tonia Ghent, MD  Patient is from: Home.  Fairly oriented at baseline.  Wheelchair-bound at baseline.  DOA: 09/28/2019 LOS: 1  Brief Narrative / Interim history: 86 year old male with history of COPD not on oxygen, bradycardia/PPM in [id number], diastolic CHF, dysphagia with aspiration, HTN, hypothyroidism, sacral decubitus, debility and COVID-19 pneumonia in 06/2019 presenting with AMS, shortness of breath, productive cough, mild temp and decreased urine output for several days.  In ED, hemodynamically stable.  T-max 99.9.  90% on 4 L.  Hgb 10.2. Na 153.  Otherwise, CBC and CMP without significant finding.  Lactic acid 0.9.  COVID-19 and influenza PCR is negative.  CXR without acute finding.  Started on IV Solu-Medrol, vancomycin and cefepime and admitted.  On admission, antibiotic disc related to azithromycin to cover for COPD exacerbation.  Subjective: Seen and examined earlier this morning with patient's daughter at bedside.  No major events overnight or this morning.  He is awake oriented to self and family.  No apparent distress.  Denies pain.  Objective: Vitals:   09/29/19 2350 09/30/19 0400 09/30/19 0732 09/30/19 0903  BP: 137/67 (!) 147/91  (!) 146/70  Pulse: 72 75  75  Resp:    (!) 22  Temp: 97.9 F (36.6 C) 97.8 F (36.6 C)  97.7 F (36.5 C)  TempSrc:      SpO2: 93% 96% 92% 96%    Intake/Output Summary (Last 24 hours) at 09/30/2019 1427 Last data filed at 09/30/2019 1244 Gross per 24 hour  Intake 1396.53 ml  Output 600 ml  Net 796.53 ml   There were no vitals filed for this visit.  Examination:  GENERAL: Frail but nontoxic.  No distress. HEENT: MMM.  Vision and hearing grossly intact.  NECK: Supple.  No apparent JVD.  RESP: 98 to 100% on RA.  No IWOB.  Diminished aeration bilaterally. CVS:  RRR. Heart sounds normal.  ABD/GI/GU: Bowel sounds present. Soft. Non tender.  MSK/EXT: Some  degree of BLE contractures.  Global muscle mass and subcu fat loss. SKIN: Stage II sacral decubitus. NEURO: Awake and oriented to self and family.  No apparent focal neuro deficit other than generalized weakness and BLE contractures. PSYCH: Calm.  No distress or agitation.  Procedures:  None  Microbiology summarized: COVID-19 PCR negative. Influenza PCR negative. Blood cultures negative so far.  Assessment & Plan: Acute respiratory failure with hypoxia due to COPD exacerbation-patient with cardinal symptoms of COPD exacerbation including SOB, productive cough and mild fever.  No leukocytosis.  Hypoxia resolved. -Continue IV Solu-Medrol, azithromycin and as needed DuoNeb. -Resume home trelegy Ellipta.  Discontinue Brovana and Pulmicort -Aspiration precautions.  Hypovolemic hypernatremia likely due to dehydration in the setting of poor p.o. intake: Na 153> 149 -Increase D5-1/4NS-KCl from 75 cc an hour to 100 cc an hour. -Recheck in the morning  Acute metabolic encephalopathy: No history of dementia per daughter.  Seems to be back to baseline.  -Frequent reorientation and delirium, fall and aspiration precautions -Avoid sedating medications  Bradycardia s/p PPM: -Chronic and stable with V paced rhythm.  Chronic diastolic CHF: Hypovolemic/volume depleted. -IV fluid as above -Continue monitoring fluid status.  Essential hypertension: BP not well controlled. -As needed IV hydralazine while n.p.o. -Amlodipine 5 mg daily  Pyuria/bacteriuria: No UTI symptoms per daughter -No indication for antibiotics  Hypothyroidism: -Continue Synthroid.  Normocytic anemia: stable -Continue monitoring intermittently   Dysphagia: -Appreciate SLP input-Dysphagia-1 diet -Aspiration precautions.  Insomnia: Continue  Seroquel at night.  Severe protein calorie malnutrition: There is no height or weight on file to calculate BMI.  Significant muscle mass and subcu fat loss.  Likely due to  poor p.o. intake. -Appreciate dietitian and SLP input Nutrition Problem: Inadequate oral intake Etiology: poor appetite  Signs/Symptoms: per patient/family report  Interventions: Magic cup, MVI  Sacral ulcer: POA.  Chronic and appears stable per daughter.  -Appreciate wound care input Pressure Injury 09/29/19 Sacrum Medial Stage 2 -  Partial thickness loss of dermis presenting as a shallow open injury with a red, pink wound bed without slough. (Active)  09/29/19 1139  Location: Sacrum  Location Orientation: Medial  Staging: Stage 2 -  Partial thickness loss of dermis presenting as a shallow open injury with a red, pink wound bed without slough.  Wound Description (Comments):   Present on Admission: Yes     Pressure Injury 07/07/19 Foot Left Stage 1 -  Intact skin with non-blanchable redness of a localized area usually over a bony prominence. (Active)  07/07/19 2250  Location: Foot  Location Orientation: Left  Staging: Stage 1 -  Intact skin with non-blanchable redness of a localized area usually over a bony prominence.  Wound Description (Comments):   Present on Admission: Yes     DVT prophylaxis: Subcu heparin Code Status: DNR/DNI-confirmed with patient's daughter. Family Communication: Updated patient's daughter at bedside  Discharge barrier: hypernatremia requiring hypotonic IV fluid Patient is from: Home Final disposition: Likely home with home health and DME once medically stable, likely on 4/18.  Patient's daughter not interested in SNF.  Consultants:  None   Sch Meds:  Scheduled Meds: . amLODipine  5 mg Oral Daily  . arformoterol  15 mcg Nebulization BID  . budesonide (PULMICORT) nebulizer solution  0.5 mg Nebulization BID  . ferrous sulfate  325 mg Oral Q breakfast  . guaiFENesin  15 mL Oral QID  . heparin  5,000 Units Subcutaneous Q8H  . levothyroxine  100 mcg Oral QAC breakfast  . methylPREDNISolone (SOLU-MEDROL) injection  60 mg Intravenous Q12H  .  multivitamin with minerals  1 tablet Oral Daily  . pantoprazole  40 mg Oral Daily  . QUEtiapine  12.5 mg Oral QHS   Continuous Infusions: . azithromycin    . dextrose 5 % and 0.2 % NaCl with KCl 20 mEq 100 mL/hr at 09/30/19 1148   PRN Meds:.acetaminophen **OR** acetaminophen, albuterol, hydrALAZINE, ipratropium-albuterol, ondansetron **OR** ondansetron (ZOFRAN) IV, polyethylene glycol  Antimicrobials: Anti-infectives (From admission, onward)   Start     Dose/Rate Route Frequency Ordered Stop   09/30/19 1245  azithromycin (ZITHROMAX) 250 mg in dextrose 5 % 125 mL IVPB     250 mg 125 mL/hr over 60 Minutes Intravenous Every 24 hours 09/30/19 1231 10/04/19 1244   09/30/19 1000  azithromycin (ZITHROMAX) tablet 250 mg  Status:  Discontinued     250 mg Oral Daily 09/28/19 2236 09/30/19 1231   09/29/19 2100  vancomycin (VANCOREADY) IVPB 750 mg/150 mL  Status:  Discontinued     750 mg 150 mL/hr over 60 Minutes Intravenous Every 24 hours 09/28/19 2025 09/28/19 2236   09/29/19 1000  azithromycin (ZITHROMAX) tablet 500 mg     500 mg Oral Daily 09/28/19 2236 09/29/19 1026   09/28/19 2030  ceFEPIme (MAXIPIME) 2 g in sodium chloride 0.9 % 100 mL IVPB  Status:  Discontinued     2 g 200 mL/hr over 30 Minutes Intravenous Every 12 hours 09/28/19 2000 09/28/19 2236   09/28/19  2030  vancomycin (VANCOREADY) IVPB 1250 mg/250 mL     1,250 mg 166.7 mL/hr over 90 Minutes Intravenous  Once 09/28/19 2016 09/29/19 0048   09/28/19 2015  vancomycin (VANCOCIN) IVPB 1000 mg/200 mL premix  Status:  Discontinued     1,000 mg 200 mL/hr over 60 Minutes Intravenous  Once 09/28/19 2000 09/28/19 2016   09/28/19 2015  azithromycin (ZITHROMAX) 500 mg in sodium chloride 0.9 % 250 mL IVPB  Status:  Discontinued     500 mg 250 mL/hr over 60 Minutes Intravenous  Once 09/28/19 2000 09/28/19 2012       I have personally reviewed the following labs and images: CBC: Recent Labs  Lab 09/28/19 1917 09/29/19 0240  WBC 6.9  9.1  NEUTROABS 4.6  --   HGB 10.2* 11.0*  HCT 32.3* 36.7*  MCV 97.6 99.7  PLT 183 172   BMP &GFR Recent Labs  Lab 09/28/19 1917 09/29/19 0240 09/30/19 0618  NA 153* 153* 149*  K 3.5 4.0 3.7  CL 118* 117* 116*  CO2 23 21* 23  GLUCOSE 90 97 159*  BUN 37* 37* 35*  CREATININE 1.17 1.15 0.93  CALCIUM 8.5* 8.4* 8.3*  MG  --   --  1.8  PHOS  --   --  2.3*   CrCl cannot be calculated (Unknown ideal weight.). Liver & Pancreas: Recent Labs  Lab 09/28/19 1917 09/30/19 0618  AST 23  --   ALT 11  --   ALKPHOS 60  --   BILITOT 0.9  --   PROT 6.4*  --   ALBUMIN 2.4* 2.3*   No results for input(s): LIPASE, AMYLASE in the last 168 hours. No results for input(s): AMMONIA in the last 168 hours. Diabetic: No results for input(s): HGBA1C in the last 72 hours. No results for input(s): GLUCAP in the last 168 hours. Cardiac Enzymes: No results for input(s): CKTOTAL, CKMB, CKMBINDEX, TROPONINI in the last 168 hours. No results for input(s): PROBNP in the last 8760 hours. Coagulation Profile: Recent Labs  Lab 09/28/19 1917  INR 1.2   Thyroid Function Tests: No results for input(s): TSH, T4TOTAL, FREET4, T3FREE, THYROIDAB in the last 72 hours. Lipid Profile: No results for input(s): CHOL, HDL, LDLCALC, TRIG, CHOLHDL, LDLDIRECT in the last 72 hours. Anemia Panel: No results for input(s): VITAMINB12, FOLATE, FERRITIN, TIBC, IRON, RETICCTPCT in the last 72 hours. Urine analysis:    Component Value Date/Time   COLORURINE YELLOW 09/29/2019 0520   APPEARANCEUR HAZY (A) 09/29/2019 0520   LABSPEC 1.021 09/29/2019 0520   PHURINE 5.0 09/29/2019 0520   GLUCOSEU NEGATIVE 09/29/2019 0520   GLUCOSEU NEGATIVE 05/26/2018 1716   HGBUR SMALL (A) 09/29/2019 0520   BILIRUBINUR NEGATIVE 09/29/2019 0520   KETONESUR 20 (A) 09/29/2019 0520   PROTEINUR 30 (A) 09/29/2019 0520   UROBILINOGEN 0.2 05/26/2018 1716   NITRITE NEGATIVE 09/29/2019 0520   LEUKOCYTESUR SMALL (A) 09/29/2019 0520   Sepsis  Labs: Invalid input(s): PROCALCITONIN, [place]  Microbiology: Recent Results (from the past 240 hour(s))  Culture, blood (Routine x 2)     Status: None (Preliminary result)   Collection Time: 09/28/19  7:17 PM   Specimen: BLOOD  Result Value Ref Range Status   Specimen Description BLOOD BLOOD RIGHT FOREARM  Final   Special Requests   Final    BOTTLES DRAWN AEROBIC AND ANAEROBIC Blood Culture adequate volume   Culture   Final    NO GROWTH 2 DAYS Performed at [facility], [address] [place], [place] [address]    Report Status PENDING  Incomplete  Culture, blood (Routine x 2)     Status: None (Preliminary result)   Collection Time: 09/28/19  7:22 PM   Specimen: BLOOD  Result Value Ref Range Status   Specimen Description BLOOD BLOOD LEFT FOREARM  Final   Special Requests   Final    BOTTLES DRAWN AEROBIC AND ANAEROBIC Blood Culture adequate volume   Culture   Final    NO GROWTH 2 DAYS Performed at [facility], [place] [address], [place], [place] [address]    Report Status PENDING  Incomplete  Respiratory Panel by RT PCR (Flu A&B, Covid) - Nasopharyngeal Swab     Status: None   Collection Time: 09/28/19  7:51 PM   Specimen: Nasopharyngeal Swab  Result Value Ref Range Status   SARS Coronavirus 2 by RT PCR NEGATIVE NEGATIVE Final    Comment: (NOTE) SARS-CoV-2 target nucleic acids are NOT DETECTED. The SARS-CoV-2 RNA is generally detectable in upper respiratoy specimens during the acute phase of infection. The lowest concentration of SARS-CoV-2 viral copies this assay can detect is 131 copies/mL. A negative result does not preclude SARS-Cov-2 infection and should not be used as the sole basis for treatment or other patient management decisions. A negative result may occur with  improper specimen collection/handling, submission of specimen other than nasopharyngeal swab, presence of viral mutation(s) within the areas targeted by this assay, and inadequate number  of viral copies (<131 copies/mL). A negative result must be combined with clinical observations, patient history, and epidemiological information. The expected result is Negative. Fact Sheet for Patients:  PinkCheek.be Fact Sheet for Healthcare Providers:  GravelBags.it This test is not yet ap proved or cleared by the [place] FDA and  has been authorized for detection and/or diagnosis of SARS-CoV-2 by FDA under an Emergency Use Authorization (EUA). This EUA will remain  in effect (meaning this test can be used) for the duration of the COVID-19 declaration under Section 564(b)(1) of the Act, 21 U.S.C. section 360bbb-3(b)(1), unless the authorization is terminated or revoked sooner.    Influenza A by PCR NEGATIVE NEGATIVE Final   Influenza B by PCR NEGATIVE NEGATIVE Final    Comment: (NOTE) The Xpert Xpress SARS-CoV-2/FLU/RSV assay is intended as an aid in  the diagnosis of influenza from Nasopharyngeal swab specimens and  should not be used as a sole basis for treatment. Nasal washings and  aspirates are unacceptable for Xpert Xpress SARS-CoV-2/FLU/RSV  testing. Fact Sheet for Patients: PinkCheek.be Fact Sheet for Healthcare Providers: GravelBags.it This test is not yet approved or cleared by the [place] FDA and  has been authorized for detection and/or diagnosis of SARS-CoV-2 by  FDA under an Emergency Use Authorization (EUA). This EUA will remain  in effect (meaning this test can be used) for the duration of the  Covid-19 declaration under Section 564(b)(1) of the Act, 21  U.S.C. section 360bbb-3(b)(1), unless the authorization is  terminated or revoked. Performed at [facility], [place] [address], [place],  [address]     Radiology Studies: No results found.   Mandie Crabbe T. [place]  If 7PM-7AM, please contact  night-coverage [email or web address] Password [facility] 09/30/2019, 2:27 PM

## 2019-09-30 NOTE — Evaluation (Signed)
Occupational Therapy Evaluation Patient Details Name: [name] Calamia Sr. MRN: [id number] DOB: 10/03/1924 Today's Date: 09/30/2019    History of Present Illness 86 yo male with onset of AMS and fever was admitted, noted SOB and use of O2 which was preceding arrival of hosp.  Has been having elevated HR, significant skin breakdown on sacrum, hypoxia.  PMHx:  bradycardia, pacemaker, COPD, chronic resp failure, CHF with EF 55-60%, dysphagia, aspiration, THA,    Clinical Impression   PATIENT WAS VERY LETHARGIC DURING EVALUATION. PATIENT REQUIRED VERBAL AND TACTILE CUES TO OPEN EYES. PNT WAS NOT FOLLOWING 1 STEP DIRECTION CONSISTENTLY.  HE IS CURRENTLY NOT APPROPRIATE FOR OT SERVICES. PER CHART HE IS GOING TO HOSPICE. ONE TIME EVALUATION PERFORMED.     Follow Up Recommendations  No OT follow up    Equipment Recommendations  None recommended by OT    Recommendations for Other Services       Precautions / Restrictions Precautions Precautions: Fall Precaution Comments: skin breakdown, contractures knees Restrictions Weight Bearing Restrictions: No      Mobility Bed Mobility   Bed Mobility: Rolling Rolling: Total assist            Transfers Overall transfer level: (UNABLE TO ASSESS SECONDARY TO PATIENT LETHERGY.)                    Balance                                           ADL either performed or assessed with clinical judgement   ADL Overall ADL's : Needs assistance/impaired(UNKNOWN BASELINE) Eating/Feeding: Total assistance   Grooming: Bed level;Total assistance   Upper Body Bathing: Bed level;Total assistance   Lower Body Bathing: Bed level;Total assistance   Upper Body Dressing : Bed level;Total assistance   Lower Body Dressing: Bed level;Total assistance               Functional mobility during ADLs: Total assistance General ADL Comments: PNT IS TOTAL ASSIST WITH ADLS.      Vision Baseline Vision/History: (PNT NOT  ABLE TO ANSWER QUESTION ON VISION. )       Perception     Praxis      Pertinent Vitals/Pain Pain Assessment: No/denies pain     Hand Dominance Right   Extremity/Trunk Assessment Upper Extremity Assessment Upper Extremity Assessment: Difficult to assess due to impaired cognition           Communication Communication Communication: Expressive difficulties   Cognition Arousal/Alertness: Lethargic Behavior During Therapy: Flat affect Overall Cognitive Status: No family/caregiver present to determine baseline cognitive functioning                                 General Comments: PATIENT WAS VERY DIFFCIULT TO AROUSE. PATIENT WAS NOT FOLLOWING DIRECTIONS.    General Comments       Exercises     Shoulder Instructions      Home Living Family/patient expects to be discharged to:: Private residence Living Arrangements: Children Available Help at Discharge: Family;Available 24 hours/day Type of Home: House Home Access: Level entry     Home Layout: One level     Bathroom Shower/Tub: Teacher, early years/pre: Standard     Home Equipment: Tub bench;Walker - 2 wheels;Bedside commode;Hospital bed;Wheelchair - Education administrator (comment)  Prior Functioning/Environment Level of Independence: Needs assistance  Gait / Transfers Assistance Needed: transfers with family assist, to wc with family helping to propel but Dec 2020 and before walked with min guard on RW ADL's / Homemaking Assistance Needed: assist to dress and bathe with caregiver, can eat with supervision only Communication / Swallowing Assistance Needed: per daughter has delirium at home, talking some times          OT Problem List:        OT Treatment/Interventions:      OT Goals(Current goals can be found in the care plan section) Acute Rehab OT Goals Patient Stated Goal: none stated  OT Frequency:     Barriers to D/C:            Co-evaluation               AM-PAC OT "6 Clicks" Daily Activity     Outcome Measure Help from another person eating meals?: Total Help from another person taking care of personal grooming?: Total Help from another person toileting, which includes using toliet, bedpan, or urinal?: Total Help from another person bathing (including washing, rinsing, drying)?: Total Help from another person to put on and taking off regular upper body clothing?: Total Help from another person to put on and taking off regular lower body clothing?: Total 6 Click Score: 6   End of Session Nurse Communication: (patient nurse ok therapy)  Activity Tolerance: Patient limited by lethargy Patient left: in bed;with call bell/phone within reach;with bed alarm set                   Time: [id number] OT Time Calculation (min): 30 min Charges:  OT General Charges $OT Visit: 1 Visit  Reece Packer OT/L  [place] 09/30/2019, 12:09 PM

## 2019-10-01 DIAGNOSIS — J441 Chronic obstructive pulmonary disease with (acute) exacerbation: Secondary | ICD-10-CM | POA: Diagnosis not present

## 2019-10-01 DIAGNOSIS — J9621 Acute and chronic respiratory failure with hypoxia: Secondary | ICD-10-CM | POA: Diagnosis not present

## 2019-10-01 DIAGNOSIS — I5032 Chronic diastolic (congestive) heart failure: Secondary | ICD-10-CM | POA: Diagnosis not present

## 2019-10-01 DIAGNOSIS — E43 Unspecified severe protein-calorie malnutrition: Secondary | ICD-10-CM | POA: Diagnosis not present

## 2019-10-01 LAB — RENAL FUNCTION PANEL
Albumin: 2.2 g/dL — ABNORMAL LOW (ref 3.5–5.0)
Anion gap: 8 (ref 5–15)
BUN: 26 mg/dL — ABNORMAL HIGH (ref 8–23)
CO2: 19 mmol/L — ABNORMAL LOW (ref 22–32)
Calcium: 8.2 mg/dL — ABNORMAL LOW (ref 8.9–10.3)
Chloride: 114 mmol/L — ABNORMAL HIGH (ref 98–111)
Creatinine, Ser: 0.78 mg/dL (ref 0.61–1.24)
GFR calc Af Amer: >60 mL/min (ref >60)
GFR calc non Af Amer: >60 mL/min (ref >60)
Glucose, Bld: 161 mg/dL — ABNORMAL HIGH (ref 70–99)
Phosphorus: 2 mg/dL — ABNORMAL LOW (ref 2.5–4.6)
Potassium: 4.2 mmol/L (ref 3.5–5.1)
Sodium: 141 mmol/L (ref 135–145)

## 2019-10-01 LAB — MAGNESIUM: Magnesium: 1.7 mg/dL (ref 1.7–2.4)

## 2019-10-01 MED ORDER — AZITHROMYCIN 250 MG PO TABS: 250.0000 mg | ORAL_TABLET | Freq: Every day | ORAL | 0 refills | Status: AC

## 2019-10-01 MED ORDER — PREDNISONE 50 MG PO TABS: 50.0000 mg | ORAL_TABLET | Freq: Every day | ORAL | 0 refills | Status: AC

## 2019-10-01 MED ORDER — AMLODIPINE BESYLATE 10 MG PO TABS: 10.0000 mg | ORAL_TABLET | Freq: Every day | ORAL | 1 refills | Status: AC

## 2019-10-01 NOTE — Progress Notes (Signed)
Discharge instructions reviewed with pt's daughter.  Copy of instructions given to daughter, scripts sent in to pt's pharmacy electronically by MD, daughter made aware.        Pt going home, to be transported by [facility]. Transportation arranged by CM, waiting their arrival.

## 2019-10-01 NOTE — Progress Notes (Deleted)
PHARMACIST - PHYSICIAN COMMUNICATION ° °CONCERNING: Antibiotic IV to Oral Route Change Policy ° °RECOMMENDATION: °This patient is receiving azithromycin by the intravenous route.  Based on criteria approved by the Pharmacy and Therapeutics Committee, the antibiotic(s) is/are being converted to the equivalent oral dose form(s). ° ° °DESCRIPTION: °These criteria include: °Patient being treated for a respiratory tract infection, urinary tract infection, cellulitis or clostridium difficile associated diarrhea if on metronidazole °The patient is not neutropenic and does not exhibit a GI malabsorption state °The patient is eating (either orally or via tube) and/or has been taking other orally administered medications for a least 24 hours °The patient is improving clinically and has a Tmax < 100.5 ° °If you have questions about this conversion, please contact the Pharmacy Department  °[]  ( [phone] )  [place] °[]  ( [phone] )  [facility] °[x]  ( [phone] )  [place] °[]  ( [phone] )  [facility] °[]  ( [phone] )  [facility]   ° ° °Marsella Suman, PharmD °Clinical Pharmacist °**Pharmacist phone directory can now be found on [email or web address] ([email or web address]).  Listed under [facility]. ° ° °

## 2019-10-01 NOTE — Progress Notes (Signed)
PTAR here to transport pt, daughter here and will leave to meet PTAR at their home. Daughter took pt belongings with her. Chart information and DNR form taken by transport/PTAR.

## 2019-10-01 NOTE — Discharge Summary (Signed)
Physician Discharge Summary  [name] Pilotti Sr. [id number] DOB: 1924-10-09 DOA: 09/28/2019  PCP: Tonia Ghent, MD  Admit date: 09/28/2019 Discharge date: 10/01/2019  Admitted From: Home Disposition: Home  Recommendations for Outpatient Follow-up:  1. Follow ups as below. 2. Please obtain CBC/BMP/Mag at follow up 3. Please follow up on the following pending results: None  Home Health: PT/OT/RN/SLP/aide Equipment/Devices: Patient has appropriate DME at home.  Discharge Condition: Stable CODE STATUS: DNR/DNI  Hospital Course: 86 year old male with history of COPD not on oxygen, bradycardia/PPM in [id number], diastolic CHF, dysphagia with aspiration, HTN, hypothyroidism, sacral decubitus, debility and COVID-19 pneumonia in 06/2019 presenting with AMS, shortness of breath, productive cough, mild temp and decreased urine output for several days.  In ED, hemodynamically stable.  T-max 99.9.  90% on 4 L.  Hgb 10.2. Na 153.  Otherwise, CBC and CMP without significant finding.  Lactic acid 0.9.  COVID-19 and influenza PCR is negative.  CXR without acute finding.  Started on IV Solu-Medrol, vancomycin and cefepime and admitted.  On admission, antibiotic deescalated to azithromycin to cover for COPD exacerbation.  Continued on IV Solu-Medrol.  Started on Brovana, Pulmicort and DuoNeb.  COPD exacerbation resolved.  He was weaned to room air and maintained appropriate saturation.  Discharged on prednisone and azithromycin for 2 more days.  He is already on Trelegy Ellipta and as needed DuoNeb at home.  Hypovolemic hypernatremia resolved with hypotonic IV fluid.  He was awake, alert and oriented to self, place and family on the day of discharge.  Evaluated by PT/OT who recommended SNF.  However, daughter declined SNF and discharged home with [facility] PT/OT/SLP/RN/aide.  See individual problem list below for more hospital course.  Discharge Diagnoses:  Acute respiratory failure with hypoxia due to  COPD exacerbation-resolved.  Saturating at 100% on RA. -Discharged on azithromycin and prednisone for 2 more days to complete 5 days course -Resume home Trelegy Ellipta and as needed albuterol.  Dysphagia-SLP recommended dysphagia 1 diet.  -HH SLP ordered.  Daughter comfortable with managing this. -Aspiration precautions.  Hypovolemic hypernatremia due to dehydration in the setting of poor p.o. intake: Na 153> 149> 141 -Recheck sodium in 1 week. -Emphasized the importance of good hydration to his daughter  Acute metabolic encephalopathy: No history of dementia per daughter.   Oriented to self, place and person on the day of discharge which seems to be his baseline. -Frequent reorientation and delirium, fall and aspiration precautions -Avoid sedating medications  Bradycardia s/p PPM: -Chronic and stable with V paced rhythm.  Chronic diastolic CHF: Hypovolemic/volume depleted.  Not on diuretics. -Recommend liberating fluid restriction  Essential hypertension: SBP slightly elevated. -Discontinued hydralazine and discharged on amlodipine 10 mg daily for convenience of administration.  Pyuria/bacteriuria: No UTI symptoms per daughter -No indication for antibiotics  Hypothyroidism: -Continue Synthroid.  Normocytic anemia: stable -Continue monitoring intermittently   Insomnia: Continue Seroquel at night.  Severe protein calorie malnutrition: There is no height or weight on file to calculate BMI.  Significant muscle mass and subcu fat loss.  Likely due to poor p.o. intake. -HH SLP on dysphagia 1 diet per SLP recommendation. Nutrition Problem: Inadequate oral intake Etiology: poor appetite  Signs/Symptoms: per patient/family report  Interventions: Magic cup, MVI   Pressure Injury 09/29/19 Sacrum Medial Stage 2 -  Partial thickness loss of dermis presenting as a shallow open injury with a red, pink wound bed without slough. (Active)  09/29/19 1139  Location: Sacrum    Location Orientation: Medial  Staging: Stage  2 -  Partial thickness loss of dermis presenting as a shallow open injury with a red, pink wound bed without slough.  Wound Description (Comments):   Present on Admission: Yes       Discharge Instructions  Discharge Instructions    Call MD for:  difficulty breathing, headache or visual disturbances   Complete by: As directed    Call MD for:  persistant dizziness or light-headedness   Complete by: As directed    Call MD for:  persistant nausea and vomiting   Complete by: As directed    Diet general   Complete by: As directed    Puree and soft texture   Discharge instructions   Complete by: As directed    It has been a pleasure taking care of you! You were hospitalized with COPD exacerbation, dehydration and elevated sodium level due to dehydration.  Your breathing, dehydration and sodium level improved with treatment to the point we think it is safe to let you go home.  We are discharging you on more antibiotic and steroid to complete treatment course for COPD exacerbation.  Please use you Trelegy Ellipta daily as prescribed.  You albuterol nebulizer is only as needed for shortness of breath, cough or wheezing.  Do not use albuterol daily unless you have to.  It is important to keep good hydration to prevent hypernatremia (elevated sodium) from recurring. Please review your new medication list and the directions before you take your medications.  Follow-up with your primary care doctor in 1 to 2 weeks.   Take care,   Increase activity slowly   Complete by: As directed      Allergies as of 10/01/2019      Reactions   Codeine Nausea And Vomiting      Medication List    STOP taking these medications   hydrALAZINE 50 MG tablet Commonly known as: APRESOLINE   potassium chloride SA 20 MEQ tablet Commonly known as: KLOR-CON     TAKE these medications   albuterol (2.5 MG/3ML) 0.083% nebulizer solution Commonly known as:  PROVENTIL Take 3 mLs (2.5 mg total) by nebulization every 6 (six) hours as needed for wheezing or shortness of breath (dx J43.2).   albuterol 108 (90 Base) MCG/ACT inhaler Commonly known as: VENTOLIN HFA Inhale 2 puffs into the lungs every 6 (six) hours as needed for shortness of breath or wheezing.   amLODipine 10 MG tablet Commonly known as: NORVASC Take 1 tablet (10 mg total) by mouth daily.   azithromycin 250 MG tablet Commonly known as: ZITHROMAX Take 1 tablet (250 mg total) by mouth daily. continue antibiotics through 10/03/19   calcium-vitamin D 500-200 MG-UNIT tablet Take 1 tablet by mouth daily.   ferrous sulfate 325 (65 FE) MG EC tablet Take 325 mg by mouth daily with breakfast.   Fluticasone-Umeclidin-Vilant 100-62.5-25 MCG/INH Aepb Take 1 puff by mouth daily.   Flutter Devi Use as directed   guaiFENesin 100 MG/5ML liquid Commonly known as: ROBITUSSIN Take 10 mLs (200 mg total) by mouth 2 (two) times daily.   levothyroxine 100 MCG tablet Commonly known as: SYNTHROID Take 1 tablet (100 mcg total) by mouth daily before breakfast.   omeprazole 40 MG capsule Commonly known as: PRILOSEC TAKE 1 CAPSULE BY MOUTH EVERY DAY What changed:   how much to take  how to take this  when to take this  additional instructions   polyethylene glycol 17 g packet Commonly known as: MIRALAX / GLYCOLAX Take 17 g by  mouth daily as needed for mild constipation.   predniSONE 50 MG tablet Commonly known as: DELTASONE Take 1 tablet (50 mg total) by mouth daily for 2 days. Start taking on: October 02, 2019   QUEtiapine 25 MG tablet Commonly known as: SEROQUEL 0.5 TABLET BY MOUTH AT BEDTIME What changed:   how much to take  how to take this  when to take this   Tylenol 325 MG tablet Generic drug: acetaminophen Take 650 mg by mouth 2 (two) times daily as needed (for pain).       Consultations:  None  Procedures/Studies:   DG Chest Port 1 View  Result Date:  09/28/2019 CLINICAL DATA:  Altered level of consciousness, fever, somnolent EXAM: PORTABLE CHEST 1 VIEW COMPARISON:  11/10/2018 FINDINGS: Single frontal view of the chest demonstrates dual lead pacemaker overlying left chest, proximal lead overlying right atrium and distal lead excluded by collimation. The cardiac silhouette is unremarkable. There is ectasia of the thoracic aorta. Chronic scarring within the lungs, without airspace disease, effusion, or pneumothorax. No acute bony abnormalities. IMPRESSION: 1. No acute intrathoracic process. Electronically Signed   By: Randa Ngo M.D.   On: 09/28/2019 20:06       Discharge Exam: Vitals:   09/30/19 2004 10/01/19 0448  BP: (!) 152/78 (!) 165/84  Pulse: 67 (!) 59  Resp: 16 15  Temp: 97.9 F (36.6 C) 98.4 F (36.9 C)  SpO2: 99% 98%    GENERAL: Frail and chronically ill-appearing.  No apparent distress.  Nontoxic. HEENT: MMM.  Vision and hearing grossly intact.  NECK: Supple.  No apparent JVD.  RESP: 100% on RA.  No IWOB.  Fair air movement bilaterally. CVS:  RRR. Heart sounds normal.  ABD/GI/GU: Bowel sounds present. Soft. Non tender.  MSK/EXT: Some degree of BLE contractures.  Global muscle mass and subcu fat loss. SKIN: Stage II sacral decubitus ulcer. NEURO: Awake, alert and oriented to self, place and person..  No apparent focal neuro deficit. PSYCH: Calm. Normal affect.   The results of significant diagnostics from this hospitalization (including imaging, microbiology, ancillary and laboratory) are listed below for reference.     Microbiology: Recent Results (from the past 240 hour(s))  Culture, blood (Routine x 2)     Status: None (Preliminary result)   Collection Time: 09/28/19  7:17 PM   Specimen: BLOOD  Result Value Ref Range Status   Specimen Description BLOOD BLOOD RIGHT FOREARM  Final   Special Requests   Final    BOTTLES DRAWN AEROBIC AND ANAEROBIC Blood Culture adequate volume   Culture   Final    NO GROWTH 3  DAYS Performed at [facility], [address] [place], [place] [address]    Report Status PENDING  Incomplete  Culture, blood (Routine x 2)     Status: None (Preliminary result)   Collection Time: 09/28/19  7:22 PM   Specimen: BLOOD  Result Value Ref Range Status   Specimen Description BLOOD BLOOD LEFT FOREARM  Final   Special Requests   Final    BOTTLES DRAWN AEROBIC AND ANAEROBIC Blood Culture adequate volume   Culture   Final    NO GROWTH 3 DAYS Performed at [facility], [place] [address] [place], [place] [address]    Report Status PENDING  Incomplete  Respiratory Panel by RT PCR (Flu A&B, Covid) - Nasopharyngeal Swab     Status: None   Collection Time: 09/28/19  7:51 PM   Specimen: Nasopharyngeal Swab  Result Value Ref Range Status   SARS Coronavirus 2 by RT PCR NEGATIVE NEGATIVE Final    Comment: (NOTE) SARS-CoV-2 target nucleic acids are NOT DETECTED. The SARS-CoV-2 RNA is generally detectable in upper respiratoy specimens during the acute phase of infection. The lowest concentration of SARS-CoV-2 viral copies this assay can detect is 131 copies/mL. A negative result does not preclude SARS-Cov-2 infection and should not be used as the sole basis for treatment or other patient management decisions. A negative result may occur with  improper specimen collection/handling, submission of specimen other than nasopharyngeal swab, presence of viral mutation(s) within the areas targeted by this assay, and inadequate number of viral copies (<131 copies/mL). A negative result must be combined with clinical observations, patient history, and epidemiological information. The expected result is Negative. Fact Sheet for Patients:  PinkCheek.be Fact Sheet for Healthcare Providers:  GravelBags.it This test is not yet ap proved or cleared by the [place] FDA and  has been authorized for detection and/or diagnosis  of SARS-CoV-2 by FDA under an Emergency Use Authorization (EUA). This EUA will remain  in effect (meaning this test can be used) for the duration of the COVID-19 declaration under Section 564(b)(1) of the Act, 21 U.S.C. section 360bbb-3(b)(1), unless the authorization is terminated or revoked sooner.    Influenza A by PCR NEGATIVE NEGATIVE Final   Influenza B by PCR NEGATIVE NEGATIVE Final    Comment: (NOTE) The Xpert Xpress SARS-CoV-2/FLU/RSV assay is intended as an aid in  the diagnosis of influenza from Nasopharyngeal swab specimens and  should not be used as a sole basis for treatment. Nasal washings and  aspirates are unacceptable for Xpert Xpress SARS-CoV-2/FLU/RSV  testing. Fact Sheet for Patients: PinkCheek.be Fact Sheet for Healthcare Providers: GravelBags.it This test is not yet approved or cleared by the [place] FDA and  has been authorized for detection and/or diagnosis of SARS-CoV-2 by  FDA under an Emergency Use Authorization (EUA). This EUA will remain  in effect (meaning this test can be used) for the duration of the  Covid-19 declaration under Section 564(b)(1) of the Act, 21  U.S.C. section 360bbb-3(b)(1), unless the authorization is  terminated or revoked. Performed at [facility], [place] [address] [place], [place] [address]      Labs: BNP (last 3 results) No results for input(s): BNP in the last 8760 hours. Basic Metabolic Panel: Recent Labs  Lab 09/28/19 1917 09/29/19 0240 09/30/19 0618 10/01/19 0443  NA 153* 153* 149* 141  K 3.5 4.0 3.7 4.2  CL 118* 117* 116* 114*  CO2 23 21* 23 19*  GLUCOSE 90 97 159* 161*  BUN 37* 37* 35* 26*  CREATININE 1.17 1.15 0.93 0.78  CALCIUM 8.5* 8.4* 8.3* 8.2*  MG  --   --  1.8 1.7  PHOS  --   --  2.3* 2.0*   Liver Function Tests: Recent Labs  Lab 09/28/19 1917 09/30/19 0618 10/01/19 0443  AST 23  --   --   ALT 11  --   --   ALKPHOS 60  --    --   BILITOT 0.9  --   --   PROT 6.4*  --   --   ALBUMIN 2.4* 2.3* 2.2*   No results for input(s): LIPASE, AMYLASE in the last 168 hours. No results for input(s): AMMONIA in the last 168 hours. CBC: Recent Labs  Lab 09/28/19 1917 09/29/19 0240  WBC 6.9 9.1  NEUTROABS 4.6  --  HGB 10.2* 11.0*  HCT 32.3* 36.7*  MCV 97.6 99.7  PLT 183 172   Cardiac Enzymes: No results for input(s): CKTOTAL, CKMB, CKMBINDEX, TROPONINI in the last 168 hours. BNP: Invalid input(s): POCBNP CBG: No results for input(s): GLUCAP in the last 168 hours. D-Dimer No results for input(s): DDIMER in the last 72 hours. Hgb A1c No results for input(s): HGBA1C in the last 72 hours. Lipid Profile No results for input(s): CHOL, HDL, LDLCALC, TRIG, CHOLHDL, LDLDIRECT in the last 72 hours. Thyroid function studies No results for input(s): TSH, T4TOTAL, T3FREE, THYROIDAB in the last 72 hours.  Invalid input(s): FREET3 Anemia work up No results for input(s): VITAMINB12, FOLATE, FERRITIN, TIBC, IRON, RETICCTPCT in the last 72 hours. Urinalysis    Component Value Date/Time   COLORURINE YELLOW 09/29/2019 0520   APPEARANCEUR HAZY (A) 09/29/2019 0520   LABSPEC 1.021 09/29/2019 0520   PHURINE 5.0 09/29/2019 0520   GLUCOSEU NEGATIVE 09/29/2019 0520   GLUCOSEU NEGATIVE 05/26/2018 1716   HGBUR SMALL (A) 09/29/2019 0520   BILIRUBINUR NEGATIVE 09/29/2019 0520   KETONESUR 20 (A) 09/29/2019 0520   PROTEINUR 30 (A) 09/29/2019 0520   UROBILINOGEN 0.2 05/26/2018 1716   NITRITE NEGATIVE 09/29/2019 0520   LEUKOCYTESUR SMALL (A) 09/29/2019 0520   Sepsis Labs Invalid input(s): PROCALCITONIN,  WBC,  LACTICIDVEN   Time coordinating discharge: 45 minutes  SIGNED:  Mercy Riding, MD  [facility] 10/01/2019, 9:46 AM  If 7PM-7AM, please contact night-coverage [email or web address] Password [email or web address]

## 2019-10-01 NOTE — Care Management (Signed)
Spoke w daughter at bedside.  Confirmed address. States she is ready for PTAR anytime. Forms placed on chart, charge nurse obtaining GOLD DNR from MD.  [name] [name] called for pick up anytime

## 2019-10-02 ENCOUNTER — Telehealth: Payer: Self-pay

## 2019-10-02 NOTE — Telephone Encounter (Signed)
1st attempt- Left HIPAA complaint message on voicemail asking patient to return call to complete TCM and schedule hospital follow up.

## 2019-10-02 NOTE — Telephone Encounter (Signed)
Transition Care Management Follow-up Telephone Call  Date of discharge and from where: 10/[facility]/2019[facility]  How have you been since you were released from the hospital? Spoke with [name], daughter (HIPAA verified) and she states that patient is doing okay. Some fatigue still noted.   Any questions or concerns? Yes, blood pressure medication was changed by hospital  Items Reviewed:  Did the pt receive and understand the discharge instructions provided? Yes   Medications obtained and verified? Yes   Any new allergies since your discharge? No   Dietary orders reviewed? Yes  Do you have support at home? Yes   Functional Questionnaire: (I = Independent and D = Dependent) ADLs: D  Bathing/Dressing- D  Meal Prep- D  Eating- I  Maintaining continence- I  Transferring/Ambulation- D  Managing Meds- D  Follow up appointments reviewed:   PCP Hospital f/u appt confirmed? No  [name] needs someone to call her to schedule follow up. The only time available for hospital follow up within 2 weeks on provider schedule is 10/05/19 @ 2:15 pm. This does not work for her. Next available is over 2 weeks away. Patient needs to be worked in some kind of way.   [facility] f/u appt confirmed? N/A   Are transportation arrangements needed? No   If their condition worsens, is the pt aware to call PCP or go to the Emergency Dept.? Yes  Was the patient provided with contact information for the PCP's office or ED? Yes  Was to pt encouraged to call back with questions or concerns? Yes

## 2019-10-02 NOTE — Telephone Encounter (Signed)
Spoke with [name] (daughter) and patient is scheduled for 11 am 10/03/2019.

## 2019-10-02 NOTE — Telephone Encounter (Signed)
What about 11 AM tomorrow?  I will be out of clinic starting Friday.

## 2019-10-03 ENCOUNTER — Telehealth (INDEPENDENT_AMBULATORY_CARE_PROVIDER_SITE_OTHER): Payer: Medicare Other | Admitting: Family Medicine

## 2019-10-03 ENCOUNTER — Other Ambulatory Visit: Payer: Self-pay

## 2019-10-03 ENCOUNTER — Encounter: Payer: Self-pay | Admitting: Family Medicine

## 2019-10-03 VITALS — BP 147/87 | HR 74 | Temp 96.3°F | Ht 65.0 in | Wt 128.0 lb

## 2019-10-03 DIAGNOSIS — J9621 Acute and chronic respiratory failure with hypoxia: Secondary | ICD-10-CM

## 2019-10-03 DIAGNOSIS — E870 Hyperosmolality and hypernatremia: Secondary | ICD-10-CM

## 2019-10-03 DIAGNOSIS — J449 Chronic obstructive pulmonary disease, unspecified: Secondary | ICD-10-CM | POA: Diagnosis not present

## 2019-10-03 LAB — CULTURE, BLOOD (ROUTINE X 2)
Culture: NO GROWTH
Culture: NO GROWTH
Special Requests: ADEQUATE
Special Requests: ADEQUATE

## 2019-10-03 NOTE — Progress Notes (Signed)
Virtual visit completed through WebEx or similar program Patient location: home  Provider location: Financial controller at [facility], office   Pandemic considerations d/w pt.   Limitations and rationale for visit method d/w patient/family.  Family and patient agreed to proceed.   Daughter and patient taking part in visit.  CC: inpatient f/u.    HPI:  Recent inpatient stay discussed with patient and daughter.  Dehydration tx'd with IVF.  He was treated with with abx given his fever.   History of COPD, tx'd with steroids during inpatient stay.  He improved enough to come home.  He already had a sacral wound before he was admitted to the hospital.  Daughter has been caring for patient at home.  Daughter describes "ups and downs" since coming home.  His appetite is variable.  H/o sacral wound, still present.  Cared for with routine wound care.  Family is supportive, encouraging fluids.  He ate a better breakfast this AM.      History of hypoxia.  O2 at 90% after getting accurate reading/warming his hand.  On RA at baseline.  He doesn't have inc WOB now but had some this AM.    "I feel alright" but he has limited participation in conversation.  He denies SOB and lightheadedness or pain at time of call.    His cough is better today compared to prev days.  He has some post nasal gtt/throat congestion that he has trouble clearing.  We talked about trying bulb suction.    He has home health coming out starting tomorrow.    Meds and allergies reviewed.   ROS: Per HPI unless specifically indicated in ROS section   NAD Speech wnl  A/P:  History of hypoxic respiratory failure, sacral wound, deconditioning, decreased appetite.  Has had home health coming on the meantime.  Reasonable to continue for now.  We talked about palliative care versus hospice care.  If the patient is at the point where he no longer ever wants to be hospitalized and only wants care at home that may make sense to have him  evaluated by hospice.  Rationale discussed with patient and daughter.  They consented for referral.  Referral placed.  We will check on getting follow-up labs via home health in the meantime.  They are trying to manage his situation at home.  He is not hypoxic currently.  They are continuing routine wound care for his sacral wound.  I would not change his medications at this point.

## 2019-10-04 ENCOUNTER — Other Ambulatory Visit: Payer: Medicare Other | Admitting: Internal Medicine

## 2019-10-04 DIAGNOSIS — I130 Hypertensive heart and chronic kidney disease with heart failure and stage 1 through stage 4 chronic kidney disease, or unspecified chronic kidney disease: Secondary | ICD-10-CM | POA: Diagnosis not present

## 2019-10-04 DIAGNOSIS — D696 Thrombocytopenia, unspecified: Secondary | ICD-10-CM | POA: Diagnosis not present

## 2019-10-04 DIAGNOSIS — K5900 Constipation, unspecified: Secondary | ICD-10-CM | POA: Diagnosis not present

## 2019-10-04 DIAGNOSIS — Z87891 Personal history of nicotine dependence: Secondary | ICD-10-CM | POA: Diagnosis not present

## 2019-10-04 DIAGNOSIS — K219 Gastro-esophageal reflux disease without esophagitis: Secondary | ICD-10-CM | POA: Diagnosis not present

## 2019-10-04 DIAGNOSIS — I959 Hypotension, unspecified: Secondary | ICD-10-CM | POA: Diagnosis not present

## 2019-10-04 DIAGNOSIS — J441 Chronic obstructive pulmonary disease with (acute) exacerbation: Secondary | ICD-10-CM | POA: Diagnosis not present

## 2019-10-04 DIAGNOSIS — I5032 Chronic diastolic (congestive) heart failure: Secondary | ICD-10-CM | POA: Diagnosis not present

## 2019-10-04 DIAGNOSIS — E876 Hypokalemia: Secondary | ICD-10-CM | POA: Diagnosis not present

## 2019-10-04 DIAGNOSIS — D631 Anemia in chronic kidney disease: Secondary | ICD-10-CM | POA: Diagnosis not present

## 2019-10-04 DIAGNOSIS — N189 Chronic kidney disease, unspecified: Secondary | ICD-10-CM | POA: Diagnosis not present

## 2019-10-04 NOTE — Assessment & Plan Note (Addendum)
History of hypoxic respiratory failure, sacral wound, deconditioning, decreased appetite.  Has had home health coming on the meantime.  Reasonable to continue for now.  We talked about palliative care versus hospice care.  If the patient is at the point where he no longer ever wants to be hospitalized and only wants care at home that may make sense to have him evaluated by hospice.  Rationale discussed with patient and daughter.  They consented for referral.  Referral placed.  We will check on getting follow-up labs via home health in the meantime.  They are trying to manage his situation at home.  He is not hypoxic currently.  They are continuing routine wound care for his sacral wound.  I would not change his medications at this point.   At least 30 minutes were devoted to patient care in this encounter (this can potentially include time spent reviewing the patient's file/history, interviewing and examining the patient, counseling/reviewing plan with patient, ordering referrals, ordering tests, reviewing relevant laboratory or x-ray data, and documenting the encounter).

## 2019-10-04 NOTE — Assessment & Plan Note (Signed)
We will see about getting follow-up labs via home health.

## 2019-10-05 ENCOUNTER — Telehealth: Payer: Self-pay

## 2019-10-05 DIAGNOSIS — Z87891 Personal history of nicotine dependence: Secondary | ICD-10-CM | POA: Diagnosis not present

## 2019-10-05 DIAGNOSIS — I130 Hypertensive heart and chronic kidney disease with heart failure and stage 1 through stage 4 chronic kidney disease, or unspecified chronic kidney disease: Secondary | ICD-10-CM | POA: Diagnosis not present

## 2019-10-05 DIAGNOSIS — K5900 Constipation, unspecified: Secondary | ICD-10-CM | POA: Diagnosis not present

## 2019-10-05 DIAGNOSIS — J441 Chronic obstructive pulmonary disease with (acute) exacerbation: Secondary | ICD-10-CM | POA: Diagnosis not present

## 2019-10-05 DIAGNOSIS — D696 Thrombocytopenia, unspecified: Secondary | ICD-10-CM | POA: Diagnosis not present

## 2019-10-05 DIAGNOSIS — D631 Anemia in chronic kidney disease: Secondary | ICD-10-CM | POA: Diagnosis not present

## 2019-10-05 DIAGNOSIS — I959 Hypotension, unspecified: Secondary | ICD-10-CM | POA: Diagnosis not present

## 2019-10-05 DIAGNOSIS — I5032 Chronic diastolic (congestive) heart failure: Secondary | ICD-10-CM | POA: Diagnosis not present

## 2019-10-05 DIAGNOSIS — K219 Gastro-esophageal reflux disease without esophagitis: Secondary | ICD-10-CM | POA: Diagnosis not present

## 2019-10-05 DIAGNOSIS — N189 Chronic kidney disease, unspecified: Secondary | ICD-10-CM | POA: Diagnosis not present

## 2019-10-05 DIAGNOSIS — E876 Hypokalemia: Secondary | ICD-10-CM | POA: Diagnosis not present

## 2019-10-05 NOTE — Telephone Encounter (Signed)
Please give the order.  Thanks.   

## 2019-10-05 NOTE — Telephone Encounter (Addendum)
Tina with [facility] advised.

## 2019-10-05 NOTE — Telephone Encounter (Signed)
Otila Kluver, nurse with [facility], called asking for verbal orders to have nursing services come out and access patient/evaluation. Otila Kluver states she is aware that Dr Damita Dunnings ordered hospice care for the patient but the daughter has not heard from them yet so Otila Kluver wanted to get care started for patient and once Hospice comes out and take over the necessary services than Otila Kluver will cancel service with [facility].  Is this ok? Tina's CB is [phone]

## 2019-10-06 ENCOUNTER — Telehealth: Payer: Self-pay

## 2019-10-06 ENCOUNTER — Ambulatory Visit (INDEPENDENT_AMBULATORY_CARE_PROVIDER_SITE_OTHER): Payer: Medicare Other

## 2019-10-06 ENCOUNTER — Other Ambulatory Visit: Payer: Self-pay

## 2019-10-06 ENCOUNTER — Encounter: Payer: Self-pay | Admitting: Family Medicine

## 2019-10-06 DIAGNOSIS — I959 Hypotension, unspecified: Secondary | ICD-10-CM | POA: Diagnosis not present

## 2019-10-06 DIAGNOSIS — K219 Gastro-esophageal reflux disease without esophagitis: Secondary | ICD-10-CM | POA: Diagnosis not present

## 2019-10-06 DIAGNOSIS — N189 Chronic kidney disease, unspecified: Secondary | ICD-10-CM | POA: Diagnosis not present

## 2019-10-06 DIAGNOSIS — E876 Hypokalemia: Secondary | ICD-10-CM | POA: Diagnosis not present

## 2019-10-06 DIAGNOSIS — Z87891 Personal history of nicotine dependence: Secondary | ICD-10-CM | POA: Diagnosis not present

## 2019-10-06 DIAGNOSIS — D696 Thrombocytopenia, unspecified: Secondary | ICD-10-CM | POA: Diagnosis not present

## 2019-10-06 DIAGNOSIS — I130 Hypertensive heart and chronic kidney disease with heart failure and stage 1 through stage 4 chronic kidney disease, or unspecified chronic kidney disease: Secondary | ICD-10-CM | POA: Diagnosis not present

## 2019-10-06 DIAGNOSIS — I5032 Chronic diastolic (congestive) heart failure: Secondary | ICD-10-CM | POA: Diagnosis not present

## 2019-10-06 DIAGNOSIS — K5900 Constipation, unspecified: Secondary | ICD-10-CM | POA: Diagnosis not present

## 2019-10-06 DIAGNOSIS — D631 Anemia in chronic kidney disease: Secondary | ICD-10-CM | POA: Diagnosis not present

## 2019-10-06 DIAGNOSIS — Z0000 Encounter for general adult medical examination without abnormal findings: Secondary | ICD-10-CM | POA: Diagnosis not present

## 2019-10-06 DIAGNOSIS — J441 Chronic obstructive pulmonary disease with (acute) exacerbation: Secondary | ICD-10-CM | POA: Diagnosis not present

## 2019-10-06 NOTE — Telephone Encounter (Signed)
Abigail Butts, with [facility] collective/palliative, called stating that patient's daughter decided she was not ready for patient to be with Hospice care yet and so the patient has not been admitted to Hospice services.  FYI to Dr Damita Dunnings. If any questions for Abigail Butts her CB is [phone].

## 2019-10-06 NOTE — Telephone Encounter (Signed)
[name] [name] requesting lab orders since pt is going to stay with [facility]. [place] request cb.

## 2019-10-06 NOTE — Telephone Encounter (Addendum)
-----   Message from Tonia Ghent, MD sent at 10/04/2019 10:41 PM EDT ----- Can he get a follow-up CBC/Bmet/Mag level done via home health?  Dx:  E87.0  Thanks.  Brigitte Pulse  Order faxed to [facility]  [place] with [facility] advised.

## 2019-10-06 NOTE — [place] Notes (Signed)
Subjective:   [name] Cutrer Sr. is a 86 y.o. male who presents for an Initial Medicare Annual Wellness Visit.  Review of Systems: N/A    This visit is being conducted through telemedicine via telephone at the nurse health advisor's home address due to the COVID-19 pandemic. This patient has given me verbal consent via doximity to conduct this visit, patient states they are participating from their home address. Patient has given consent to talk with is daughter, [name] [name]. Patient, [name] [name] (daughter, HIPAA verified) and myself are on the telephone call. There is no referral for this visit. Some vital signs may be absent or patient reported.    Patient identification: identified by name, DOB, and current address   Cardiac Risk Factors include: advanced age (>2men, >11 women);male gender;hypertension    Objective:    Today's Vitals   There is no height or weight on file to calculate BMI.  Advanced Directives 10/06/2019 03/22/2018 11/01/2015 03/29/2015 10/06/2012  Does Patient Have a Medical Advance Directive? Yes No Yes Yes Patient has advance directive, copy not in chart  Type of Advance Directive [place];Living will - [place];Living will [place];Living will Living will  Copy of [place] in Chart? Yes - validated most recent copy scanned in chart (See row information) - - - Copy requested from family  Would patient like information on creating a medical advance directive? - No - Patient declined - - -  Pre-existing out of facility DNR order (yellow form or pink MOST form) - - - - No    Current Medications (verified) Outpatient Encounter Medications as of 10/06/2019  Medication Sig  . acetaminophen (TYLENOL) 325 MG tablet Take 650 mg by mouth 2 (two) times daily as needed (for pain).  Marland Kitchen albuterol (PROVENTIL) (2.5 MG/3ML) 0.083% nebulizer solution Take 3 mLs (2.5 mg total) by nebulization every 6 (six) hours  as needed for wheezing or shortness of breath (dx J43.2).  Marland Kitchen albuterol (VENTOLIN HFA) 108 (90 Base) MCG/ACT inhaler Inhale 2 puffs into the lungs every 6 (six) hours as needed for shortness of breath or wheezing.  Marland Kitchen amLODipine (NORVASC) 10 MG tablet Take 1 tablet (10 mg total) by mouth daily.  Marland Kitchen azithromycin (ZITHROMAX) 250 MG tablet Take 1 tablet (250 mg total) by mouth daily. continue antibiotics through 10/03/19  . Calcium Carb-Cholecalciferol (CALCIUM-VITAMIN D) 500-200 MG-UNIT tablet Take 1 tablet by mouth daily.  . ferrous sulfate 325 (65 FE) MG EC tablet Take 325 mg by mouth daily with breakfast.  . Fluticasone-Umeclidin-Vilant 100-62.5-25 MCG/INH AEPB Take 1 puff by mouth daily.  Marland Kitchen guaiFENesin (ROBITUSSIN) 100 MG/5ML liquid Take 10 mLs (200 mg total) by mouth 2 (two) times daily.  Marland Kitchen levothyroxine (SYNTHROID) 100 MCG tablet Take 1 tablet (100 mcg total) by mouth daily before breakfast.  . omeprazole (PRILOSEC) 40 MG capsule TAKE 1 CAPSULE BY MOUTH EVERY DAY  . polyethylene glycol (MIRALAX / GLYCOLAX) packet Take 17 g by mouth daily as needed for mild constipation.  . QUEtiapine (SEROQUEL) 25 MG tablet 0.5 TABLET BY MOUTH AT BEDTIME  . Respiratory Therapy Supplies (FLUTTER) DEVI Use as directed   No facility-administered encounter medications on file as of 10/06/2019.    Allergies (verified) Codeine   History: Past Medical History:  Diagnosis Date  . Anemia   . Bradycardia   . COPD (chronic obstructive pulmonary disease) (HCC)    Mild  . HTN (hypertension)   . Hypothyroidism    Past Surgical  History:  Procedure Laterality Date  . CATARACT EXTRACTION Bilateral   . GALLBLADDER SURGERY  02/21/2011  . OTHER SURGICAL HISTORY     finger right pointer--laceration repair  . PACEMAKER INSERTION     at [facility]   . TOTAL HIP ARTHROPLASTY     Family History  Problem Relation Age of Onset  . Other Father        enlarged heart  . Lung disease Mother   . COPD Sister   . Heart disease  Sister        Pacemaker  . Lung cancer Sister   . Colon cancer Neg Hx    Social History   Socioeconomic History  . Marital status: Married    Spouse name: [name] [name]  . Number of children: 5  . Years of education: Not on file  . Highest education level: Not on file  Occupational History  . Occupation: retired  Tobacco Use  . Smoking status: Former Smoker    Packs/day: 1.50    Years: 25.00    Pack years: 37.50    Types: Cigarettes    Quit date: 06/16/1971    Years since quitting: 48.3  . Smokeless tobacco: Never Used  . Tobacco comment: started at 86 years old  Substance and Sexual Activity  . Alcohol use: No  . Drug use: No  . Sexual activity: Never  Other Topics Concern  . Not on file  Social History Narrative   Lives with son.     Social Determinants of Health   Financial Resource Strain: Low Risk   . Difficulty of Paying Living Expenses: Not hard at all  Food Insecurity: No Food Insecurity  . Worried About Charity fundraiser in the Last Year: Never true  . Ran Out of Food in the Last Year: Never true  Transportation Needs: No Transportation Needs  . Lack of Transportation (Medical): No  . Lack of Transportation (Non-Medical): No  Physical Activity: Insufficiently Active  . Days of Exercise per Week: 7 days  . Minutes of Exercise per Session: 10 min  Stress: No Stress Concern Present  . Feeling of Stress : Not at all  Social Connections:   . Frequency of Communication with Friends and Family:   . Frequency of Social Gatherings with Friends and Family:   . Attends Religious Services:   . Active Member of Clubs or Organizations:   . Attends Archivist Meetings:   Marland Kitchen Marital Status:    Tobacco Counseling Counseling given: Not Answered Comment: started at 86 years old   Clinical Intake:  Pre-visit preparation completed: Yes  Pain : No/denies pain Faces Pain Scale: No hurt  Faces Pain Scale: No hurt  Nutritional Risks: None Diabetes: No  How  often do you need to have someone help you when you read instructions, pamphlets, or other written materials from your doctor or pharmacy?: 1 - Never What is the last grade level you completed in school?: 7th  Interpreter Needed?: No  Information entered by :: CJohnson, LPN  Activities of Daily Living In your present state of health, do you have any difficulty performing the following activities: 10/06/2019  Hearing? N  Vision? N  Difficulty concentrating or making decisions? Y  Walking or climbing stairs? Y  Dressing or bathing? Y  Doing errands, shopping? Y  Preparing Food and eating ? Y  Using the Toilet? Y  In the past six months, have you accidently leaked urine? Y  Do you have problems with loss  of bowel control? Y  Managing your Medications? Y  Managing your Finances? Y  Housekeeping or managing your Housekeeping? Y  Some recent data might be hidden     Immunizations and Health Maintenance Immunization History  Administered Date(s) Administered  . Fluad Quad(high Dose 65+) 05/01/2019  . Influenza Split 02/15/2012, 02/13/2014, 05/03/2015  . Influenza, High Dose Seasonal PF 02/28/2016, 04/07/2017  . Influenza,inj,Quad PF,6+ Mos 03/08/2013, 04/26/2018  . Pneumococcal Polysaccharide-23 06/15/2010  . Tdap 10/27/2017   Health Maintenance Due  Topic Date Due  . COVID-19 Vaccine (1) Never done  . PNA vac Low Risk Adult (2 of 2 - PCV13) 06/16/2011    Patient Care Team: Tonia Ghent, MD as PCP - General (Family Medicine) Jacqualine Mau Aletha Halim, NP as Nurse Practitioner [facility] and Palliative Medicine)  Indicate any recent Medical Services you may have received from other than [facility] providers in the past year (date may be approximate).    Assessment:   This is a routine wellness examination for [place].  Hearing/Vision screen  Hearing Screening   125Hz  250Hz  500Hz  1000Hz  2000Hz  3000Hz  4000Hz  6000Hz  8000Hz   Right ear:           Left ear:           Vision Screening  Comments: Advised daughter patient should get annual eye exams  Dietary issues and exercise activities discussed: Current Exercise Habits: Home exercise routine, Type of exercise: stretching, Time (Minutes): 15, Frequency (Times/Week): 7, Weekly Exercise (Minutes/Week): 105, Intensity: Mild, Exercise limited by: psychological condition(s)  Goals    . Patient Stated     10/06/2019, I will maintain and continue medications as prescribed.       Depression Screen PHQ 2/9 Scores 10/06/2019  PHQ - 2 Score 4  PHQ- 9 Score 4    Fall Risk Fall Risk  10/06/2019  Falls in the past year? 1  Number falls in past yr: 1  Injury with Fall? 0  Risk for fall due to : Medication side effect;Impaired balance/gait;Impaired mobility  Follow up Falls evaluation completed;Falls prevention discussed    Is the patient's home free of loose throw rugs in walkways, pet beds, electrical cords, etc?   yes      Grab bars in the bathroom? yes      Handrails on the stairs?   yes      Adequate lighting?   yes  Timed Get Up and Go performed: N/A  Cognitive Function: MMSE - Mini Mental State Exam 10/06/2019  Not completed: Unable to complete       Mini Cog  Mini-Cog screen was completed. Maximum score is 22. A value of 0 denotes this part of the MMSE was not completed or the patient failed this part of the Mini-Cog screening.  Screening Tests Health Maintenance  Topic Date Due  . COVID-19 Vaccine (1) Never done  . PNA vac Low Risk Adult (2 of 2 - PCV13) 06/16/2011  . INFLUENZA VACCINE  01/14/2020  . TETANUS/TDAP  10/28/2027    Qualifies for Shingles Vaccine: Yes  Cancer Screenings: Lung: Low Dose CT Chest recommended if Age 57-80 years, 30 pack-year currently smoking OR have quit w/in 15 years. Patient does not qualify. Colorectal: no longer required  Additional Screenings:  Hepatitis C Screening: N/A      Plan:    Patient will maintain and continue medications as prescribed.   I have  personally reviewed and noted the following in the patient's chart:   . Medical and social history .  Use of alcohol, tobacco or illicit drugs  . Current medications and supplements . Functional ability and status . Nutritional status . Physical activity . Advanced directives . List of other physicians . Hospitalizations, surgeries, and ER visits in previous 12 months . Vitals . Screenings to include cognitive, depression, and falls . Referrals and appointments  In addition, I have reviewed and discussed with patient certain preventive protocols, quality metrics, and best practice recommendations. A written personalized care plan for preventive services as well as general preventive health recommendations were provided to patient.     Andrez Grime, LPN   [phone]

## 2019-10-06 NOTE — Telephone Encounter (Signed)
Order faxed to [place] with [place] advised.

## 2019-10-06 NOTE — [place] Notes (Signed)
PCP notes:  Health Maintenance: Prevnar 13- due   Abnormal Screenings: none   Patient concerns: [name] [name] states that patient needs the doctor to write an order for a suction for his mouth area only.   Nurse concerns: none   Next PCP appt.: none

## 2019-10-06 NOTE — Patient Instructions (Signed)
Mr. [name] [name] , Thank you for taking time to come for your Medicare Wellness Visit. I appreciate your ongoing commitment to your health goals. Please review the following plan we discussed and let me know if I can assist you in the future.   Screening recommendations/referrals: Colonoscopy: no longer required Recommended yearly ophthalmology/optometry visit for glaucoma screening and checkup Recommended yearly dental visit for hygiene and checkup  Vaccinations: Influenza vaccine: Up to date, completed 05/01/2019 Pneumococcal vaccine: due Tdap vaccine: Up to date, completed 10/27/2017 Shingles vaccine: discussed    Advanced directives: copy in chart  Conditions/risks identified: hypertension  Next appointment: none  Preventive Care 17 Years and Older, Male Preventive care refers to lifestyle choices and visits with your health care provider that can promote health and wellness. What does preventive care include?  A yearly physical exam. This is also called an annual well check.  Dental exams once or twice a year.  Routine eye exams. Ask your health care provider how often you should have your eyes checked.  Personal lifestyle choices, including:  Daily care of your teeth and gums.  Regular physical activity.  Eating a healthy diet.  Avoiding tobacco and drug use.  Limiting alcohol use.  Practicing safe sex.  Taking low doses of aspirin every day.  Taking vitamin and mineral supplements as recommended by your health care provider. What happens during an annual well check? The services and screenings done by your health care provider during your annual well check will depend on your age, overall health, lifestyle risk factors, and family history of disease. Counseling  Your health care provider may ask you questions about your:  Alcohol use.  Tobacco use.  Drug use.  Emotional well-being.  Home and relationship well-being.  Sexual activity.  Eating  habits.  History of falls.  Memory and ability to understand (cognition).  Work and work Statistician. Screening  You may have the following tests or measurements:  Height, weight, and BMI.  Blood pressure.  Lipid and cholesterol levels. These may be checked every 5 years, or more frequently if you are over 110 years old.  Skin check.  Lung cancer screening. You may have this screening every year starting at age 77 if you have a 30-pack-year history of smoking and currently smoke or have quit within the past 15 years.  Fecal occult blood test (FOBT) of the stool. You may have this test every year starting at age 62.  Flexible sigmoidoscopy or colonoscopy. You may have a sigmoidoscopy every 5 years or a colonoscopy every 10 years starting at age 28.  Prostate cancer screening. Recommendations will vary depending on your family history and other risks.  Hepatitis C blood test.  Hepatitis B blood test.  Sexually transmitted disease (STD) testing.  Diabetes screening. This is done by checking your blood sugar (glucose) after you have not eaten for a while (fasting). You may have this done every 1-3 years.  Abdominal aortic aneurysm (AAA) screening. You may need this if you are a current or former smoker.  Osteoporosis. You may be screened starting at age 73 if you are at high risk. Talk with your health care provider about your test results, treatment options, and if necessary, the need for more tests. Vaccines  Your health care provider may recommend certain vaccines, such as:  Influenza vaccine. This is recommended every year.  Tetanus, diphtheria, and acellular pertussis (Tdap, Td) vaccine. You may need a Td booster every 10 years.  Zoster vaccine. You may need  this after age 67.  Pneumococcal 13-valent conjugate (PCV13) vaccine. One dose is recommended after age 32.  Pneumococcal polysaccharide (PPSV23) vaccine. One dose is recommended after age 32. Talk to your health  care provider about which screenings and vaccines you need and how often you need them. This information is not intended to replace advice given to you by your health care provider. Make sure you discuss any questions you have with your health care provider. Document Released: 06/28/2015 Document Revised: 02/19/2016 Document Reviewed: 04/02/2015 Elsevier Interactive Patient Education  2017 [place] Prevention in the Home Falls can cause injuries. They can happen to people of all ages. There are many things you can do to make your home safe and to help prevent falls. What can I do on the outside of my home?  Regularly fix the edges of walkways and driveways and fix any cracks.  Remove anything that might make you trip as you walk through a door, such as a raised step or threshold.  Trim any bushes or trees on the path to your home.  Use bright outdoor lighting.  Clear any walking paths of anything that might make someone trip, such as rocks or tools.  Regularly check to see if handrails are loose or broken. Make sure that both sides of any steps have handrails.  Any raised decks and porches should have guardrails on the edges.  Have any leaves, snow, or ice cleared regularly.  Use sand or salt on walking paths during winter.  Clean up any spills in your garage right away. This includes oil or grease spills. What can I do in the bathroom?  Use night lights.  Install grab bars by the toilet and in the tub and shower. Do not use towel bars as grab bars.  Use non-skid mats or decals in the tub or shower.  If you need to sit down in the shower, use a plastic, non-slip stool.  Keep the floor dry. Clean up any water that spills on the floor as soon as it happens.  Remove soap buildup in the tub or shower regularly.  Attach bath mats securely with double-sided non-slip rug tape.  Do not have throw rugs and other things on the floor that can make you trip. What can I do  in the bedroom?  Use night lights.  Make sure that you have a light by your bed that is easy to reach.  Do not use any sheets or blankets that are too big for your bed. They should not hang down onto the floor.  Have a firm chair that has side arms. You can use this for support while you get dressed.  Do not have throw rugs and other things on the floor that can make you trip. What can I do in the kitchen?  Clean up any spills right away.  Avoid walking on wet floors.  Keep items that you use a lot in easy-to-reach places.  If you need to reach something above you, use a strong step stool that has a grab bar.  Keep electrical cords out of the way.  Do not use floor polish or wax that makes floors slippery. If you must use wax, use non-skid floor wax.  Do not have throw rugs and other things on the floor that can make you trip. What can I do with my stairs?  Do not leave any items on the stairs.  Make sure that there are handrails on both sides of  the stairs and use them. Fix handrails that are broken or loose. Make sure that handrails are as long as the stairways.  Check any carpeting to make sure that it is firmly attached to the stairs. Fix any carpet that is loose or worn.  Avoid having throw rugs at the top or bottom of the stairs. If you do have throw rugs, attach them to the floor with carpet tape.  Make sure that you have a light switch at the top of the stairs and the bottom of the stairs. If you do not have them, ask someone to add them for you. What else can I do to help prevent falls?  Wear shoes that:  Do not have high heels.  Have rubber bottoms.  Are comfortable and fit you well.  Are closed at the toe. Do not wear sandals.  If you use a stepladder:  Make sure that it is fully opened. Do not climb a closed stepladder.  Make sure that both sides of the stepladder are locked into place.  Ask someone to hold it for you, if possible.  Clearly mark  and make sure that you can see:  Any grab bars or handrails.  First and last steps.  Where the edge of each step is.  Use tools that help you move around (mobility aids) if they are needed. These include:  Canes.  Walkers.  Scooters.  Crutches.  Turn on the lights when you go into a dark area. Replace any light bulbs as soon as they burn out.  Set up your furniture so you have a clear path. Avoid moving your furniture around.  If any of your floors are uneven, fix them.  If there are any pets around you, be aware of where they are.  Review your medicines with your doctor. Some medicines can make you feel dizzy. This can increase your chance of falling. Ask your doctor what other things that you can do to help prevent falls. This information is not intended to replace advice given to you by your health care provider. Make sure you discuss any questions you have with your health care provider. Document Released: 03/28/2009 Document Revised: 11/07/2015 Document Reviewed: 07/06/2014 Elsevier Interactive Patient Education  2017 [employer].

## 2019-10-08 ENCOUNTER — Telehealth: Payer: Self-pay | Admitting: Family Medicine

## 2019-10-09 ENCOUNTER — Other Ambulatory Visit: Payer: Self-pay | Admitting: *Deleted

## 2019-10-09 NOTE — Patient Outreach (Signed)
[facility]) Care Management  10/09/2019  Emmet Hinh Sr. 1925/06/15 [id number]   EMMI-General Discharge-PT DECEASED Day#4  Date 2019/10/11   Outreach #1 RN spoke with the pt's daughter who indicates pt expired over the weekend. Case closed from any further calls or intervention. RN offered moral support and the call ended. Case will be closed.  Raina Mina, RN Care Management Coordinator [place] [facility] [phone]

## 2019-10-09 NOTE — Telephone Encounter (Signed)
[place] Night - Client Nonclinical Telephone Record AccessNurse Client [place] Night - Client Client Site [facility] Physician Renford Dills - MD Contact Type Call Who Is Calling Physician / Provider / Hospital Call Type Provider Call PC Page Now Reason for Call Request for Physician Consult Initial Comment Parademic with [facility] needs to speak with on call regarding death notice. Additional Comment Patient Name [name] [name], Patient DOB Dec 30, 1924 Requesting Provider [facility] Physician Number [phone] Facility Name [facility]. Time Disposition Final User Nov 05, 2019 8:04:43 AM Send to [place], [place]ins 11/05/19 8:19:47 AM Paged On Call back to Call [place], David 11/05/19 8:20:07 AM Page Completed Yes Donzetta Sprung DoctorName Phone DateTime Result/Outcome Message Type Notes [place], Betty - MD [id number] 05-Nov-2019 8:19:47 AM Called On Call Provider - Left Message Doctor Paged [place], Betty - MD 11-05-19 8:19:56 AM Spoke with On Call - General Message Result Call Closed By: Dalia Heading Transaction Date/Time: 2019/11/05 8:01:43 AM (ET)

## 2019-10-10 NOTE — Telephone Encounter (Signed)
Condolences extended to patient's daughter and she expressed her appreciation to Dr. Damita Dunnings for extending care for her Dad.

## 2019-10-12 NOTE — Telephone Encounter (Signed)
Noted. Thanks.

## 2019-10-14 NOTE — Telephone Encounter (Signed)
I am out of clinic but please let his daughter know that I extend my condolences.  I greatly appreciate all of her effort.  Please update the chart as the patient has died.  Thanks.  I appreciate the help of all involved.

## 2019-10-14 NOTE — Telephone Encounter (Signed)
Shanon Brow from EMS called to report that [name] [name] has died at home. He was DNR. Time of death 7:55 Am. Body will be transported to the funeral home and death certificate will be sent to PCP. Loyalty Arentz [place], MD

## 2019-10-14 NOTE — Telephone Encounter (Signed)
Noted. Thanks.

## 2019-10-14 DEATH — deceased

## 2019-10-19 ENCOUNTER — Telehealth: Payer: Self-pay | Admitting: Family Medicine

## 2019-10-19 NOTE — Telephone Encounter (Signed)
I'm covering for PCP who is out of town. I filled out death certificate after reviewing chart.

## 2019-10-20 NOTE — Telephone Encounter (Signed)
Many thanks.
